# Patient Record
Sex: Female | Born: 1950
Health system: Southern US, Community
[De-identification: ages and names within clinical notes are randomized; demographics above are authoritative.]

## PROBLEM LIST (undated history)

## (undated) DIAGNOSIS — I4891 Unspecified atrial fibrillation: Secondary | ICD-10-CM

## (undated) DIAGNOSIS — I609 Nontraumatic subarachnoid hemorrhage, unspecified: Secondary | ICD-10-CM

## (undated) DIAGNOSIS — M1990 Unspecified osteoarthritis, unspecified site: Secondary | ICD-10-CM

## (undated) DIAGNOSIS — K219 Gastro-esophageal reflux disease without esophagitis: Secondary | ICD-10-CM

## (undated) DIAGNOSIS — Z87442 Personal history of urinary calculi: Secondary | ICD-10-CM

## (undated) DIAGNOSIS — H5440 Blindness, one eye, unspecified eye: Secondary | ICD-10-CM

## (undated) DIAGNOSIS — E785 Hyperlipidemia, unspecified: Secondary | ICD-10-CM

## (undated) DIAGNOSIS — J189 Pneumonia, unspecified organism: Secondary | ICD-10-CM

## (undated) DIAGNOSIS — I10 Essential (primary) hypertension: Secondary | ICD-10-CM

## (undated) DIAGNOSIS — Z9889 Other specified postprocedural states: Secondary | ICD-10-CM

## (undated) DIAGNOSIS — H539 Unspecified visual disturbance: Secondary | ICD-10-CM

## (undated) DIAGNOSIS — I509 Heart failure, unspecified: Secondary | ICD-10-CM

## (undated) DIAGNOSIS — F419 Anxiety disorder, unspecified: Secondary | ICD-10-CM

## (undated) DIAGNOSIS — H269 Unspecified cataract: Secondary | ICD-10-CM

## (undated) DIAGNOSIS — K589 Irritable bowel syndrome without diarrhea: Secondary | ICD-10-CM

## (undated) HISTORY — DX: Unspecified osteoarthritis, unspecified site: M19.90

## (undated) HISTORY — DX: Essential (primary) hypertension: I10

## (undated) HISTORY — DX: Nontraumatic subarachnoid hemorrhage, unspecified: I60.9

## (undated) HISTORY — DX: Unspecified visual disturbance: H53.9

## (undated) HISTORY — DX: Hyperlipidemia, unspecified: E78.5

## (undated) HISTORY — DX: Unspecified cataract: H26.9

## (undated) HISTORY — DX: Heart failure, unspecified: I50.9

## (undated) HISTORY — DX: Gastro-esophageal reflux disease without esophagitis: K21.9

## (undated) HISTORY — DX: Blindness, one eye, unspecified eye: H54.40

## (undated) HISTORY — DX: Irritable bowel syndrome, unspecified: K58.9

---
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
## 1984-11-16 HISTORY — PX: ABDOMINAL HYSTERECTOMY: SHX81

## 1992-11-16 HISTORY — PX: REFRACTIVE SURGERY: SHX103

## 1993-11-16 DIAGNOSIS — H5440 Blindness, one eye, unspecified eye: Secondary | ICD-10-CM

## 1993-11-16 HISTORY — DX: Blindness, one eye, unspecified eye: H54.40

## 1998-11-16 HISTORY — PX: SPINE SURGERY: SHX786

## 1999-01-16 ENCOUNTER — Ambulatory Visit (HOSPITAL_BASED_OUTPATIENT_CLINIC_OR_DEPARTMENT_OTHER): Admission: RE | Admit: 1999-01-16 | Discharge: 1999-01-16 | Payer: Self-pay | Admitting: Orthopedic Surgery

## 1999-08-26 ENCOUNTER — Encounter: Admission: RE | Admit: 1999-08-26 | Discharge: 1999-08-26 | Payer: Self-pay | Admitting: Obstetrics and Gynecology

## 1999-09-02 ENCOUNTER — Encounter: Payer: Self-pay | Admitting: Neurosurgery

## 1999-09-03 ENCOUNTER — Encounter: Payer: Self-pay | Admitting: Neurosurgery

## 1999-09-03 ENCOUNTER — Inpatient Hospital Stay (HOSPITAL_COMMUNITY): Admission: RE | Admit: 1999-09-03 | Discharge: 1999-09-06 | Payer: Self-pay | Admitting: Neurosurgery

## 1999-09-27 ENCOUNTER — Encounter: Payer: Self-pay | Admitting: Endocrinology

## 1999-09-27 ENCOUNTER — Inpatient Hospital Stay (HOSPITAL_COMMUNITY): Admission: EM | Admit: 1999-09-27 | Discharge: 1999-09-30 | Payer: Self-pay | Admitting: Emergency Medicine

## 1999-09-28 ENCOUNTER — Encounter (HOSPITAL_BASED_OUTPATIENT_CLINIC_OR_DEPARTMENT_OTHER): Payer: Self-pay | Admitting: Internal Medicine

## 1999-09-29 ENCOUNTER — Encounter: Payer: Self-pay | Admitting: General Surgery

## 1999-11-05 ENCOUNTER — Encounter: Admission: RE | Admit: 1999-11-05 | Discharge: 1999-11-05 | Payer: Self-pay | Admitting: Neurosurgery

## 1999-11-05 ENCOUNTER — Encounter: Payer: Self-pay | Admitting: Neurosurgery

## 2000-11-30 ENCOUNTER — Encounter: Payer: Self-pay | Admitting: Obstetrics and Gynecology

## 2000-11-30 ENCOUNTER — Encounter: Admission: RE | Admit: 2000-11-30 | Discharge: 2000-11-30 | Payer: Self-pay | Admitting: Obstetrics and Gynecology

## 2001-09-14 ENCOUNTER — Ambulatory Visit (HOSPITAL_BASED_OUTPATIENT_CLINIC_OR_DEPARTMENT_OTHER): Admission: RE | Admit: 2001-09-14 | Discharge: 2001-09-14 | Payer: Self-pay | Admitting: Orthopedic Surgery

## 2002-08-08 ENCOUNTER — Encounter (HOSPITAL_BASED_OUTPATIENT_CLINIC_OR_DEPARTMENT_OTHER): Payer: Self-pay | Admitting: Internal Medicine

## 2002-08-08 ENCOUNTER — Encounter: Admission: RE | Admit: 2002-08-08 | Discharge: 2002-08-08 | Payer: Self-pay | Admitting: Internal Medicine

## 2002-08-22 ENCOUNTER — Ambulatory Visit (HOSPITAL_COMMUNITY): Admission: RE | Admit: 2002-08-22 | Discharge: 2002-08-22 | Payer: Self-pay | Admitting: Internal Medicine

## 2002-08-22 ENCOUNTER — Encounter (HOSPITAL_BASED_OUTPATIENT_CLINIC_OR_DEPARTMENT_OTHER): Payer: Self-pay | Admitting: Internal Medicine

## 2002-08-24 ENCOUNTER — Ambulatory Visit (HOSPITAL_BASED_OUTPATIENT_CLINIC_OR_DEPARTMENT_OTHER): Admission: RE | Admit: 2002-08-24 | Discharge: 2002-08-24 | Payer: Self-pay | Admitting: Orthopedic Surgery

## 2002-10-19 ENCOUNTER — Encounter: Payer: Self-pay | Admitting: Gastroenterology

## 2002-10-19 ENCOUNTER — Ambulatory Visit (HOSPITAL_COMMUNITY): Admission: RE | Admit: 2002-10-19 | Discharge: 2002-10-19 | Payer: Self-pay | Admitting: Gastroenterology

## 2002-11-16 HISTORY — PX: CARPAL TUNNEL RELEASE: SHX101

## 2003-01-25 ENCOUNTER — Encounter: Payer: Self-pay | Admitting: Obstetrics and Gynecology

## 2003-01-25 ENCOUNTER — Ambulatory Visit (HOSPITAL_COMMUNITY): Admission: RE | Admit: 2003-01-25 | Discharge: 2003-01-25 | Payer: Self-pay | Admitting: Obstetrics and Gynecology

## 2003-03-06 ENCOUNTER — Encounter: Admission: RE | Admit: 2003-03-06 | Discharge: 2003-03-06 | Payer: Self-pay | Admitting: Internal Medicine

## 2003-03-06 ENCOUNTER — Encounter (HOSPITAL_BASED_OUTPATIENT_CLINIC_OR_DEPARTMENT_OTHER): Payer: Self-pay | Admitting: Internal Medicine

## 2004-05-06 ENCOUNTER — Encounter: Admission: RE | Admit: 2004-05-06 | Discharge: 2004-05-06 | Payer: Self-pay | Admitting: Internal Medicine

## 2004-07-24 ENCOUNTER — Ambulatory Visit (HOSPITAL_COMMUNITY): Admission: RE | Admit: 2004-07-24 | Discharge: 2004-07-24 | Payer: Self-pay | Admitting: Obstetrics and Gynecology

## 2005-01-27 ENCOUNTER — Encounter: Admission: RE | Admit: 2005-01-27 | Discharge: 2005-01-27 | Payer: Self-pay | Admitting: Family Medicine

## 2005-06-16 HISTORY — PX: TOTAL KNEE ARTHROPLASTY: SHX125

## 2005-06-17 ENCOUNTER — Inpatient Hospital Stay (HOSPITAL_COMMUNITY): Admission: RE | Admit: 2005-06-17 | Discharge: 2005-06-21 | Payer: Self-pay | Admitting: Orthopedic Surgery

## 2005-07-23 ENCOUNTER — Encounter: Admission: RE | Admit: 2005-07-23 | Discharge: 2005-09-02 | Payer: Self-pay | Admitting: Orthopedic Surgery

## 2005-10-16 HISTORY — PX: SHOULDER SURGERY: SHX246

## 2005-10-23 ENCOUNTER — Ambulatory Visit (HOSPITAL_COMMUNITY): Admission: RE | Admit: 2005-10-23 | Discharge: 2005-10-24 | Payer: Self-pay | Admitting: Orthopaedic Surgery

## 2005-11-16 DIAGNOSIS — I609 Nontraumatic subarachnoid hemorrhage, unspecified: Secondary | ICD-10-CM

## 2005-11-16 HISTORY — DX: Nontraumatic subarachnoid hemorrhage, unspecified: I60.9

## 2006-01-17 ENCOUNTER — Inpatient Hospital Stay (HOSPITAL_COMMUNITY): Admission: EM | Admit: 2006-01-17 | Discharge: 2006-01-26 | Payer: Self-pay | Admitting: Emergency Medicine

## 2006-02-02 ENCOUNTER — Encounter: Admission: RE | Admit: 2006-02-02 | Discharge: 2006-02-02 | Payer: Self-pay | Admitting: Neurological Surgery

## 2006-03-11 ENCOUNTER — Ambulatory Visit (HOSPITAL_COMMUNITY): Admission: RE | Admit: 2006-03-11 | Discharge: 2006-03-11 | Payer: Self-pay | Admitting: Obstetrics and Gynecology

## 2006-03-26 ENCOUNTER — Encounter (INDEPENDENT_AMBULATORY_CARE_PROVIDER_SITE_OTHER): Payer: Self-pay | Admitting: Specialist

## 2006-03-26 ENCOUNTER — Encounter: Admission: RE | Admit: 2006-03-26 | Discharge: 2006-03-26 | Payer: Self-pay | Admitting: Obstetrics and Gynecology

## 2006-04-16 HISTORY — PX: BREAST LUMPECTOMY: SHX2

## 2006-04-22 ENCOUNTER — Ambulatory Visit (HOSPITAL_BASED_OUTPATIENT_CLINIC_OR_DEPARTMENT_OTHER): Admission: RE | Admit: 2006-04-22 | Discharge: 2006-04-22 | Payer: Self-pay | Admitting: Surgery

## 2006-04-22 ENCOUNTER — Encounter: Admission: RE | Admit: 2006-04-22 | Discharge: 2006-04-22 | Payer: Self-pay | Admitting: Surgery

## 2006-04-22 ENCOUNTER — Encounter (INDEPENDENT_AMBULATORY_CARE_PROVIDER_SITE_OTHER): Payer: Self-pay | Admitting: *Deleted

## 2006-04-24 ENCOUNTER — Encounter: Admission: RE | Admit: 2006-04-24 | Discharge: 2006-04-24 | Payer: Self-pay | Admitting: Orthopaedic Surgery

## 2006-05-16 HISTORY — PX: SHOULDER SURGERY: SHX246

## 2006-06-04 ENCOUNTER — Ambulatory Visit (HOSPITAL_COMMUNITY): Admission: RE | Admit: 2006-06-04 | Discharge: 2006-06-05 | Payer: Self-pay | Admitting: Orthopaedic Surgery

## 2006-06-23 ENCOUNTER — Encounter: Admission: RE | Admit: 2006-06-23 | Discharge: 2006-08-20 | Payer: Self-pay | Admitting: Orthopaedic Surgery

## 2006-08-25 ENCOUNTER — Encounter: Admission: RE | Admit: 2006-08-25 | Discharge: 2006-09-23 | Payer: Self-pay | Admitting: Internal Medicine

## 2006-09-24 ENCOUNTER — Encounter: Admission: RE | Admit: 2006-09-24 | Discharge: 2006-11-01 | Payer: Self-pay | Admitting: Internal Medicine

## 2006-11-02 ENCOUNTER — Encounter: Admission: RE | Admit: 2006-11-02 | Discharge: 2006-11-15 | Payer: Self-pay | Admitting: Internal Medicine

## 2006-11-22 ENCOUNTER — Encounter: Admission: RE | Admit: 2006-11-22 | Discharge: 2006-12-09 | Payer: Self-pay | Admitting: Internal Medicine

## 2006-12-05 ENCOUNTER — Encounter: Admission: RE | Admit: 2006-12-05 | Discharge: 2006-12-05 | Payer: Self-pay

## 2007-03-22 ENCOUNTER — Emergency Department (HOSPITAL_COMMUNITY): Admission: EM | Admit: 2007-03-22 | Discharge: 2007-03-22 | Payer: Self-pay | Admitting: Emergency Medicine

## 2007-03-23 ENCOUNTER — Encounter: Admission: RE | Admit: 2007-03-23 | Discharge: 2007-03-23 | Payer: Self-pay | Admitting: Orthopedic Surgery

## 2007-08-17 ENCOUNTER — Encounter: Admission: RE | Admit: 2007-08-17 | Discharge: 2007-08-17 | Payer: Self-pay | Admitting: Chiropractic Medicine

## 2007-12-13 ENCOUNTER — Encounter: Admission: RE | Admit: 2007-12-13 | Discharge: 2007-12-13 | Payer: Self-pay | Admitting: Obstetrics and Gynecology

## 2008-07-09 ENCOUNTER — Ambulatory Visit (HOSPITAL_COMMUNITY): Admission: RE | Admit: 2008-07-09 | Discharge: 2008-07-09 | Payer: Self-pay | Admitting: Family Medicine

## 2008-07-13 ENCOUNTER — Encounter: Admission: RE | Admit: 2008-07-13 | Discharge: 2008-07-13 | Payer: Self-pay | Admitting: Family Medicine

## 2009-07-02 ENCOUNTER — Encounter: Admission: RE | Admit: 2009-07-02 | Discharge: 2009-07-29 | Payer: Self-pay | Admitting: Family Medicine

## 2010-02-12 ENCOUNTER — Encounter: Admission: RE | Admit: 2010-02-12 | Discharge: 2010-02-12 | Payer: Self-pay | Admitting: Internal Medicine

## 2010-12-06 ENCOUNTER — Encounter: Payer: Self-pay | Admitting: Orthopedic Surgery

## 2010-12-06 ENCOUNTER — Encounter: Payer: Self-pay | Admitting: Family Medicine

## 2011-04-03 NOTE — Discharge Summary (Signed)
[name], [name]                 ACCOUNT NO.:  [id number]   MEDICAL RECORD NO.:  [id number]          PATIENT TYPE:  INP   LOCATION:  5018                         FACILITY:  [facility]   PHYSICIAN:  Nadara Mustard, MD     DATE OF BIRTH:  Mar 31, 1951   DATE OF ADMISSION:  06/17/2005  DATE OF DISCHARGE:  06/21/2005                                 DISCHARGE SUMMARY   DIAGNOSIS:  Osteoarthritis right knee.   PROCEDURE:  Right total knee arthroplasty.   Discharged to home in stable condition with Coumadin for DVT prophylaxis and  Vicodin for pain. Plan to follow-up in 2 weeks.   HISTORY OF PRESENT ILLNESS:  The patient is a 60 year old woman with  osteoarthritis of her right knee. The patient has failed conservative care  and presents at this time for right total knee arthroplasty. The patient  underwent a right total knee arthroplasty on June 17, 2005 with DePuy  components. The implants were sized 2.5 tibia, 3.0 femur, 32 mm patella, and  12.5 mm poly tray. The patient received Kefzol for infection prophylaxis and  Coumadin for DVT prophylaxis. The patient progressed well with physical  therapy. She ambulated approximately 10 feet on postoperative day two. Her  hemoglobin was stable at 9.9. The patient continued to progress well with  physical therapy and was discharged to home in stable condition on June 21, 2005 with home health physical therapy. Prescriptions for Vicodin and  Coumadin with follow-up in the office in 2 weeks.      Nadara Mustard, MD  Electronically Signed     MVD/MEDQ  D:  08/06/2005  T:  08/06/2005  Job:  [id number]
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
## 2011-04-03 NOTE — Op Note (Signed)
[name], [name]                 ACCOUNT NO.:  [id number]   MEDICAL RECORD NO.:  [id number]          PATIENT TYPE:  AMB   LOCATION:  DSC                          FACILITY:  [facility]   PHYSICIAN:  Currie Paris, M.D.DATE OF BIRTH:  [birth year]   DATE OF PROCEDURE:  04/22/2006  DATE OF DISCHARGE:                                 OPERATIVE REPORT   PREOPERATIVE DIAGNOSIS:  Left breast mass mammographically found.   POSTOPERATIVE DIAGNOSIS:  Left breast mass mammographically found.   OPERATION:  Needle localized excision left breast mass.   SURGEON:  Currie Paris, M.D.   ANESTHESIA:  MAC.   CLINICAL HISTORY:  [name] is a 60 year old who had an abnormality found on  mammogram.  She had a biopsy done but the pathology was discoordinate with  the lesion and excisional biopsy was recommended.  A clip had been placed at  the time of the biopsy but apparently had migrated away from the area in  question.   DESCRIPTION OF PROCEDURE:  The patient was seen in the holding area and she  had no further questions.  She already had her guidewire placed in the left  breast and she and I both confirmed and marked the left breast as the  operative side.  She had no further questions.  The patient was taken to  operating room and after being given IV sedation, the left breast was  prepped and draped as a sterile field.  The time-out occurred. An X had  marked the spot on the breast where the lesion had been noted.  The  guidewire entered in the medial part of the breast and tracked towards the  areola.  The X mark was about 1 cm away from the areolar edge.   I infiltrated the area with 1% Xylocaine plain mixed with 0.5% Marcaine with  epi.  I made a curvilinear circumareolar incision at the areolar margin.  I  elevated the skin flap medially until I could identify the guidewire  manipulated into the wound.  Then using cautery, I excised the area around  the guidewire going down towards  the chest wall, first medially and then  superior inferiorly, and then deep.  I extended the excision to go under the  nipple areolar complex because that is where the clip appeared to have  migrated and in an attempt to see if the clip could be retrieved but this  was not fully localized on the films.  This entire area felt like some fatty  tissue with some breast tissue mixed in, there was not a true obvious cancer  palpable in the tissue but I was well around the guidewire.  Additional  local was infiltrated as I worked.  Everything appeared to be dry.  I  infiltrated additional local for help with postop analgesia.  The incision  was closed with some 3-0 Vicryl, 4-0 Monocryl subcuticular, and some  Dermabond on the skin.  Radiology reported that the specimen mammogram  indicated that the area in question was in the center of the specimen.  The  patient  tolerated the procedure well.  There were no operative complications  and all counts were correct.      Currie Paris, M.D.  Electronically Signed     CJS/MEDQ  D:  04/22/2006  T:  04/22/2006  Job:  [id number]   cc:   Barry Dienes. Eloise Harman, M.D.  Fax: [phone]   S. Kyra Manges, M.D.  Fax: [phone]

## 2011-04-03 NOTE — Op Note (Signed)
[place]. [facility]  Patient:    [name], [name] Visit Number: [id number] MRN: [id number]          Service Type: Attending:  Nicki Reaper, M.D. Dictated by:   Nicki Reaper, M.D. Proc. Date: 09/14/01                             Operative Report  PREOPERATIVE DIAGNOSIS:  Stenosing tenosynovitis left ring finger.  POSTOPERATIVE DIAGNOSIS:  Stenosing tenosynovitis left ring finger.  OPERATION:  Release A-1 pulley, left ring finger.  SURGEON:  Nicki Reaper, M.D.  ASSISTANT:  R.N.  ANESTHESIA:  Forearm-based IV regional.  ANESTHESIOLOGIST:  Halford Decamp, M.D.  HISTORY:  The patient is a 60 year old female with a history of triggering of her left ring finger, nonresponsive to conservative treatment.  PROCEDURE:  The patient was brought to the operating room where a forearm-based IV regional anesthetic was carried out without difficulty.  She was prepped and draped using Betadine scrub and solution with the left arm free.  A longitudinal incision was made, carried down through subcutaneous tissue, and bleeders were electrocautery.  All neurovascular structures were protected until the A-1 pulley was identified.  This was found to be marked thickened, and incision was made on the radial aspect.  A small incision was made in the A-2 pulley centrally.  A small cyst was present at the junction of A-1 and A-2.  This was broken.  The wound was irrigated.  The skin was closed with interrupted 5-0 nylon sutures.  A sterile compressive dressing was applied.  The patient tolerated the procedure well and was taken to the recovery room, for observation, in satisfactory condition.  She is discharged home, to return to the [facility] [place] in one week, on Vicodin and Keflex. Dictated by:   Nicki Reaper, M.D. Attending:  Nicki Reaper, M.D. DD:  09/14/01 TD:  09/14/01 Job: [id number]

## 2011-04-03 NOTE — Consult Note (Signed)
[place]. [facility]  Patient:    [name]                         MRN: [id number] Proc. Date: 09/27/99 Adm. Date:  [id number] Attending:  Garlan Fillers CC:         Barry Dienes. Eloise Harman, M.D.                          Consultation Report  REASON FOR CONSULTATION:  Abdominal pain.  HISTORY OF PRESENT ILLNESS:  This 60 year old female was in her normal state of  health.  She ate some lasagne for lunch and then developed acute onset of mid right-sided abdominal pain that radiated down to her lower abdominal area and across to the left side.  The pain has persisted and worsened.  She has had nausea and vomiting with the pain.  She has had an appendectomy in the past.  Two weeks ago, she had removal of a schwannoma from her spinal cord.  She states she had constipation after that and was given some MiraLax, and that cleared the constipation, but now she is having loose bowel movements, approximately one to two a day.  She denies any blood in the stool.  She denies fever or chills.  PAST MEDICAL HISTORY: 1. Type 1 diabetes mellitus. 2. Hyperlipidemia. 3. Schwannoma of the spinal cord as above. 4. Hypertension.  PREVIOUS OPERATIONS:  Total abdominal hysterectomy and appendectomy; open reduction and internal fixation of right tibia fracture; hardware extraction, muscle flat to right lower extremity; open reduction and internal fixation, right upper extremity fracture.  ALLERGIES:  CODEINE.  MEDICATIONS:  Altace, insulin, Neurontin, Lipitor.  SOCIAL HISTORY:  No tobacco use.  Occasional alcohol use.  REVIEW OF SYSTEMS:  Cardiovascular: No known heart disease, heart attack, rheumatic fever, or murmur.  GI: No peptic ulcer disease, diverticulitis, or hepatitis or  intermittent rectal bleeding.  GU: No known kidney stones.  She did have a urinary tract infection and was treated with sulfa drugs after her back surgery.  PHYSICAL  EXAMINATION:  GENERAL:  She is an uncomfortable female.  VITAL SIGNS:  Temperature 99 degrees.  NECK:  Supple without any palpable masses.  LUNGS:  Equal breath sounds, equal expansion.  Clear to auscultation.  ABDOMEN: Soft.  There is right upper quadrant tenderness but more suprapubic and left lower quadrant tenderness with some voluntary guarding in the suprapubic and left lower quadrant areas.  There is also some right CVA tenderness. Hypoactive bowel sounds are present.  There is a lower transverse scar.  RECTAL:  Normal tone, no masses.  Stool guaiac negative.  LABORATORY DATA:  White cell count 13,300, hemoglobin 13.7.  Liver function tests and amylase were normal.  Glucose 308.  Acute abdominal series demonstrates no free air.  No dilated small-bowel loops.  There is gas in the proximal colon and transverse colon.  There is a foreign body in the right mid abdomen suspicious for renal calculus.  IMPRESSION:  Abdominal pain with differential diagnoses to include acute cholecystitis, diverticulitis, cystitis, or pyelonephritis.  RECOMMENDATIONS:  Will check urinalysis and then start her on empiric IV antibiotics.  If UA is negative, I would perform a CT scan of her abdomen. DD:  09/27/99 TD:  09/28/99 Job: [id number]

## 2011-04-03 NOTE — Consult Note (Signed)
[name], [name]   MEDICAL RECORD NO.:  [id number]          PATIENT TYPE:  INP   LOCATION:  3020                         FACILITY:  [facility]   PHYSICIAN:  Tia Alert, MD     DATE OF BIRTH:  1951/10/05   DATE OF CONSULTATION:  DATE OF DISCHARGE:  01/26/2006                                   CONSULTATION   ADMITTING DIAGNOSIS:  Subarachnoid hemorrhage.   PROCEDURE:  Four-vessel cerebral arteriogram.   BRIEF HISTORY OF PRESENT ILLNESS:  [name] is a 60 year old white female,  with a history of diabetes mellitus and hypertension, who presented to the  emergency department with complaints of sudden onset worse headache of her  life with associated neck pain and nausea. She had a CT scan done which  revealed thick subarachnoid hemorrhage in the basal cisterns and  neurosurgical evaluation was requested. She was found to be somewhat  lethargic but easily arousable and appropriate in speech with a nonfocal  motor and sensory exam. She was admitted to the neurosurgical ICU and four-  vessel cerebral arteriogram was ordered to rule out aneurysm.   HOSPITAL COURSE:  The patient was taken for a four-vessel cerebral  arteriogram on the day of her subarachnoid hemorrhage that showed no  evidence of intracranial aneurysm or other vascular lesion to explain her  subarachnoid hemorrhage. She was admitted to the neurosurgical ICU where she  had significant headache with nausea and vomiting. She was treated with  Zofran and Phenergan for nausea and treated with morphine for headaches. She  was, on hospital day #2, much more awake and alert with a nonfocal  neurologic exam. She was taken for MRI/MRA of the brain that day which  showed again no evidence of intracranial aneurysm. She remained in the ICU.  She had a repeat CT scan the following day which showed some dilatation of  her third ventricle and temple horn suggestive of some early hydrocephalus.  She had no evidence of hyponatremia. She was placed on Nimotop to help  prevent symptomatic vasospasm and was also placed on Dilantin. She had been  on Decadron but that was making her sugars difficult to control and  therefore she was taken off of this. She was placed on sliding scale as well  as her home insulin. Her blood pressure was controlled with oral blood  pressure medications. The following day, she had another CT scan which  showed what I thought was improvement of her ventricular size and  symptomatically was continuing to improve with intermittent headaches but  not constant headaches and the nausea and vomiting was certainly improving.  She got to the point that she was taking a diet without emesis. She was  transferred to the floor for further care. Then on January 25, 2006 she was  scheduled for a follow-up arteriogram which again showed no evidence  intracranial aneurysm. By this time, she had some headache controlled with  her oral pain medications. She was awake and alert and again had a nonfocal  neurologic exam. We felt she was safe for  discharge home. She was discharged  home January 26, 2006 with plans to follow up with me in one week.   DISCHARGE MEDICATIONS:  Percocet 5/325 1-2 p.o. q.4-6h. p.r.n. pain, 80  pills and no refills. We will take her off of Nimotop for discharge home.  She is to continue her home medications. The patient was given discharge  instructions. She demonstrated understanding of all discharge instructions  and had all of her questions answered to the best of my ability. A return  appointment is in one week.   Final Diagnosis: subarachnoid hemorrhage      Tia Alert, MD  Electronically Signed     DSJ/MEDQ  D:  01/26/2006  T:  01/26/2006  Job:  [id number]
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
## 2011-04-03 NOTE — Op Note (Signed)
NAME:  [name], [name]                           ACCOUNT NO.:  [id number]   MEDICAL RECORD NO.:  [id number]                   PATIENT TYPE:  AMB   LOCATION:  DSC                                  FACILITY:  [facility]   PHYSICIAN:  Cindee Salt, MD                      DATE OF BIRTH:  [birth year]   DATE OF PROCEDURE:  08/24/2002  DATE OF DISCHARGE:  08/24/2002                                 OPERATIVE REPORT   PREOPERATIVE DIAGNOSIS:  Carpal tunnel syndrome right hand/stenosing  tenosynovitis right thumb.   POSTOPERATIVE DIAGNOSIS:  Carpal tunnel syndrome right hand/stenosing  tenosynovitis right thumb.   OPERATION:  Release A1 pulley right thumb/release right carpal canal.   SURGEON:  Cindee Salt, M.D.   ASSISTANT:  R.N.   ANESTHESIA:  Forearm-based IV regional.   DATE OF OPERATION:  August 24, 2002   HISTORY:  The patient is a 60 year old female with a history of bilateral  carpal tunnel syndrome and triggering of her right thumb not responsive to  conservative treatment.   DESCRIPTION OF PROCEDURE:  The patient was brought to the operating room  where a forearm-based IV regional anesthetic was carried out without  difficulty.  She was prepped and draped using Betadine scrubbing solution  with the right arm free.  A transverse incision was made over the A1 pulley  of the thumb carried down through subcutaneous tissue.  The bleeders were  electrocauterized.  The nerves were identified and protected to the radial  side of the first A1 pulley.  An incision was made releasing the thumb which  was able to be passed through a full range of motion without difficulty.  The wound was irrigated and closed with interrupted 5-0 nylon sutures.  A  separate incision was then made over the carpal canal longitudinally carried  down through subcutaneous tissue.  Bleeders were again electrocauterized.  Palmar fascia was split.  Superficial palmar arteries were identified.  The  flexor tendon to the  ring and little finger identified to the ulnar side and  median nerve.  Carpal retinaculum was incised with sharp dissection.  A  right angle and Sewell retractor were placed between skin and forearm  fascia.  The fascia was released for approximately 3 cm proximal to the  wrist crease under direct vision.  The canal was explored.  No further  lesions were identified.  The tenosynovial tissue was moderately thickened.  There was significant fibrosis through the entire canal area.  The wound was  irrigated.  The skin was closed with interrupted 5-0 nylon sutures.  A  sterile compressive dressing and splint was applied.  The patient tolerated  the procedure well and was taken to the recovery room for observation in  satisfactory condition.  She is discharged to home to return to the [facility] [place] in 1 week on Talwin NX  and Keflex.                                               Cindee Salt, MD    GK/MEDQ  D:  08/24/2002  T:  08/27/2002  Job:  [id number]
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
## 2011-04-03 NOTE — Discharge Summary (Signed)
Clements, Brandi NO.:  [id number]   MEDICAL RECORD NO.:  [id number]          PATIENT TYPE:  INP   LOCATION:  3020                         FACILITY:  [facility]   PHYSICIAN:  Tia Alert, MD     DATE OF BIRTH:  [birth year]   DATE OF ADMISSION:  01/17/2006  DATE OF DISCHARGE:  01/26/2006                                 DISCHARGE SUMMARY   ADMITTING DIAGNOSIS:  Subarachnoid hemorrhage.   PROCEDURES:  Four-vessel cerebral arteriogram.   HISTORY OF PRESENT ILLNESS:  [name] is a 60 year old white female with a  history of diabetes mellitus and hypertension who presented to the emergency  department on January 17, 2006, with complaints of sudden onset worse headache  of her life with associated neck pain and nausea. She fell like the onset  was about 3-4 hours prior to admission to the emergency department. CT scan  showed thick subarachnoid hemorrhage in the basal cisterns and neurosurgical  evaluation was requested. The patient was somewhat lethargic but easily  arousable and answered questions appropriately.  She had a nonfocal motor  and sensory exam. She was admitted to the neurosurgical ICU for further  care.   HOSPITAL COURSE:  The patient was admitted to the neurosurgical ICU.  I  called Dr. Kerby Nora to do a four-vessel cerebral arteriogram to look  for a cause of what was suspected to be a potential intracerebral aneurysm.  She was started on nimodipine, Decadron, and Dilantin.  Dr. Corliss Skains  performed the four-vessel cerebral  arteriogram that evening and this showed  no evidence of cerebral aneurysm or other vascular malformation. The  following day, the patient looked somewhat better. She continued to complain  of headache with significant nausea but no numbness, tingling, weakness. She  was following commands. She was awake and alert. We ordered an MRI of the  brain and cervical spine that showed no vascular malformation or significant  aneurysm.   On hospital day three, she continued to have headache with nausea  and vomiting. She was placed on Zofran and Phenergan for this. She continued  on [facility] therapy. She was again given Phenergan and Zofran for nausea and  vomiting. Her neurologic exam remained nonfocal. Head CT the next day showed  no evidence of increasing ventricular size to suggest hydrocephalus. She  continued be awake and alert, oriented, she only complained of headache  which responded to medications. She continued along this course and was  transferred to the [facility] where her diet was advanced on January 21, 2006, to  liquids which she tolerated fairly well. CT scan again showed decreasing  size of the ventricular system at this time with resolving subarachnoid  hemorrhage. She had a repeat arteriogram on January 25, 2006, which again  showed no evidence of vasospasm or of aneurysm, AVM, dissection, or other  cause of subarachnoid hemorrhage. She was then transferred to the floor  where she continued to do well. She had some headache but this was well-  controlled on oral pain medications. She was tolerating a regular diet. She  was ambulating  without difficulty and she was discharged home on January 26, 2006, in stable condition.   DISCHARGE MEDICATIONS:  Vicodin 1-2 p.o. q.6h. p.r.n. pain. She was to  continue her home medications.   FOLLOW-UP:  One week with Dr. Yetta Barre.   FINAL DIAGNOSIS:  Arteriogram negative subarachnoid hemorrhage.      Tia Alert, MD  Electronically Signed     DSJ/MEDQ  D:  03/05/2006  T:  03/06/2006  Job:  [id number]
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
## 2011-04-03 NOTE — Op Note (Signed)
NAMESELENY, [name]                 ACCOUNT NO.:  [id number]   MEDICAL RECORD NO.:  [id number]          PATIENT TYPE:  INP   LOCATION:  2550                         FACILITY:  [facility]   PHYSICIAN:  Nadara Mustard, MD     DATE OF BIRTH:  February 21, 1951   DATE OF PROCEDURE:  06/17/2005  DATE OF DISCHARGE:                                 OPERATIVE REPORT   PREOP DIAGNOSIS:  Osteoarthritis, right knee.   POSTOP DIAGNOSIS:  Osteoarthritis, right knee.   PROCEDURE:  Right total knee arthroplasty.   SURGEON.:  Nadara Mustard, MD   ANESTHESIA:  General.   ESTIMATED BLOOD LOSS:  Minimal.   ANTIBIOTICS:  1 gram Kefzol.   TOURNIQUET TIME:  59 minutes at 300 mmHg at the thigh.   DISPOSITION:  To PACU in stable condition.   IMPLANTS:  Tibial component 2.5, femoral component 3.0, patella 32 mm, tray  of 12.5 mm.   INDICATIONS FOR PROCEDURE:  The patient is a 60 year old woman with  osteoarthritis of her right knee. She has had a traumatic arthritis, is  status post surgical intervention with free flap to the right knee. She has  a previous surgical incision laterally over the patella, which extends down  to the free flap. The patient has undergone conservative care which has  included arthroscopy and anti-inflammatories. She has failed conservative  care and has pain with activities of daily living and wished to proceed with  total knee arthroplasty at this time. Risks and benefits were discussed  including infection, neurovascular injury, persistent pain, need for  additional surgery, DVT, pulmonary embolus. The patient states she  understands and wished proceed at this time.   DESCRIPTION OF PROCEDURE:  The patient was brought to OR room 4 and  underwent general anesthetic. After adequate level of anesthesia obtained,  the patient's right lower extremity was prepped using DuraPrep and draped in  a sterile field with Ioban used to cover all exposed skin. The knee was  flexed and the  tourniquet inflated to 3 mmHg. A incision was made over  previous incision which was a lateral parapatellar incision. A lateral  parapatellar retinacular incision was then made and the patella was everted  medially. The starting hole was then made over the femur with a drill.  Hole  started over the femoral canal. The internal guide was placed and 12 mm was  taken off the distal femur. The femur measured for 3.5.  This was pinned for  a 4 and a 3 cutting block was used. There was no notching of the femur. The  cutting block was then placed and chamfer cuts were made on the femur.  Attention was then focused on the tibia. External alignment guide was used  and 10 mm was taken off the lateral plateau. The chamfer block was then used  for the posterior stabilized component and this was in the posterior  stabilized box cut was then made for the femur. The tibial tray was then  sized and this sized for a 2.5 and this was pinned and the keel cut was  made  for 2.5. The trial components were placed and this was trialed with a 10 and  12.5 mm.  12.5 mm had good secure fit with stable varus and valgus and had a  slight amount of hyperextension. The trial components removed. The knee was  irrigated with pulse lavage. All meniscal tissue was removed. The tibial and  femoral component were cemented in place and the tibial tray was then placed  after further pulsatile lavage. The knee was held in extension while the  components had hardened. Previously the patella had been resurfaced for 8 mm  to accept the 32-mm patella. The 32 mm component was cemented in place after  the punch holes were made for the patella and this was left with the clamp  in place and the knee was left in extension until the cement hardened. The  knee was irrigated with pulsatile lavage during this time.  The tourniquet  was deflated after 59 minutes. Hemostasis was obtained. The knee was then  again placed through full range of  motion. There was no subluxation of the  patella. The retinacular incision was closed using #1 Vicryl. Subcu was  closed using 2-0 Vicryl.  The skin was closed using Proximate staples. The  wound was covered with Adaptic orthopedic sponges, sterile Webril and Coban  dressing. The patient was extubated, taken to PACU in stable condition.       MVD/MEDQ  D:  06/17/2005  T:  06/17/2005  Job:  [id number]

## 2011-11-19 ENCOUNTER — Encounter: Payer: Self-pay | Admitting: Neurology

## 2011-12-22 ENCOUNTER — Ambulatory Visit: Payer: Self-pay | Admitting: Neurology

## 2012-01-14 ENCOUNTER — Encounter: Payer: Self-pay | Admitting: Neurology

## 2012-01-14 ENCOUNTER — Ambulatory Visit (INDEPENDENT_AMBULATORY_CARE_PROVIDER_SITE_OTHER): Payer: MEDICARE | Admitting: Neurology

## 2012-01-14 VITALS — BP 162/78 | HR 72 | Wt 250.0 lb

## 2012-01-14 DIAGNOSIS — R42 Dizziness and giddiness: Secondary | ICD-10-CM
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
## 2012-01-14 NOTE — Patient Instructions (Signed)
Your MRI is scheduled for Monday, March 4th at 9:00am.  Please arrive to [facility], first floor admitting by 8:45am.  [phone].  Physical Therapy will call you directly to scheduled.

## 2012-01-15 ENCOUNTER — Ambulatory Visit: Payer: Medicare Other | Attending: Neurology | Admitting: Physical Therapy

## 2012-01-15 DIAGNOSIS — R42 Dizziness and giddiness: Secondary | ICD-10-CM | POA: Insufficient documentation

## 2012-01-15 DIAGNOSIS — IMO0001 Reserved for inherently not codable concepts without codable children: Secondary | ICD-10-CM | POA: Insufficient documentation

## 2012-01-15 DIAGNOSIS — R269 Unspecified abnormalities of gait and mobility: Secondary | ICD-10-CM | POA: Insufficient documentation

## 2012-01-18 ENCOUNTER — Ambulatory Visit (HOSPITAL_COMMUNITY)
Admission: RE | Admit: 2012-01-18 | Discharge: 2012-01-18 | Disposition: A | Discharge status: Home / Self Care | Payer: MEDICARE | Source: Ambulatory Visit | Attending: Neurology | Admitting: Neurology

## 2012-01-18 ENCOUNTER — Telehealth: Payer: Self-pay | Admitting: Neurology

## 2012-01-18 DIAGNOSIS — R42 Dizziness and giddiness: Secondary | ICD-10-CM

## 2012-01-18 NOTE — Telephone Encounter (Signed)
Pt could not complete MRI in regular machine this morning (01/18/2012). She would like to be rescheduled for an open MRI machine.

## 2012-01-19 NOTE — Telephone Encounter (Signed)
Rescheduled pt's MRI at [facility] in an open machine 01/21/12 at 3:00pm.  Pt aware.

## 2012-01-21 ENCOUNTER — Ambulatory Visit
Admission: RE | Admit: 2012-01-21 | Discharge: 2012-01-21 | Disposition: A | Discharge status: Home / Self Care | Payer: Medicare Other | Source: Ambulatory Visit | Attending: Neurology | Admitting: Neurology

## 2012-01-21 MED ORDER — GADOBENATE DIMEGLUMINE 529 MG/ML IV SOLN
20.0000 mL | Freq: Once | INTRAVENOUS | Status: AC | PRN
  Administered 2012-01-21: 20 mL via INTRAVENOUS

## 2012-01-22 ENCOUNTER — Other Ambulatory Visit: Payer: Self-pay | Admitting: Neurology

## 2012-01-22 ENCOUNTER — Ambulatory Visit
Admission: RE | Admit: 2012-01-22 | Discharge: 2012-01-22 | Disposition: A | Discharge status: Home / Self Care | Payer: Medicare Other | Source: Ambulatory Visit | Attending: Neurology | Admitting: Neurology

## 2012-01-22 ENCOUNTER — Ambulatory Visit: Payer: Medicare Other | Admitting: Physical Therapy

## 2012-01-22 DIAGNOSIS — R42 Dizziness and giddiness: Secondary | ICD-10-CM

## 2012-01-22 MED ORDER — GADOBENATE DIMEGLUMINE 529 MG/ML IV SOLN
15.0000 mL | Freq: Once | INTRAVENOUS | Status: AC | PRN
  Administered 2012-01-22: 15 mL via INTRAVENOUS

## 2012-01-25 ENCOUNTER — Ambulatory Visit: Payer: Medicare Other | Admitting: Physical Therapy

## 2012-01-26 ENCOUNTER — Telehealth: Payer: Self-pay | Admitting: Neurology

## 2012-01-26 NOTE — Telephone Encounter (Signed)
Pt would like MRI results from 01/22/2012.

## 2012-01-27 NOTE — Telephone Encounter (Signed)
Let [name] know that her MRI brain was ok.  No explanation for her dizziness - no tumor, stroke or finding related to her old subarachnoid hemorrhage.

## 2012-01-27 NOTE — Telephone Encounter (Signed)
Called and spoke with the patient. Information given as per Dr. Modesto Charon re: MRI results. No ather concerns voiced at this time.

## 2012-02-02 ENCOUNTER — Ambulatory Visit: Payer: Medicare Other | Admitting: Physical Therapy

## 2012-02-04 ENCOUNTER — Encounter: Payer: Medicare Other | Admitting: Physical Therapy

## 2012-02-05 ENCOUNTER — Ambulatory Visit: Payer: Medicare Other | Admitting: Physical Therapy

## 2012-02-08 ENCOUNTER — Ambulatory Visit: Payer: Medicare Other | Admitting: Physical Therapy

## 2012-02-08 ENCOUNTER — Encounter: Payer: Medicare Other | Admitting: Physical Therapy

## 2012-02-09 ENCOUNTER — Encounter: Payer: Medicare Other | Admitting: Physical Therapy

## 2012-02-10 ENCOUNTER — Encounter: Payer: Medicare Other | Admitting: Physical Therapy

## 2012-02-10 ENCOUNTER — Ambulatory Visit: Payer: Medicare Other | Admitting: Physical Therapy

## 2012-02-11 ENCOUNTER — Ambulatory Visit: Payer: Medicare Other | Admitting: Physical Therapy

## 2012-02-15 ENCOUNTER — Ambulatory Visit: Payer: Medicare Other | Attending: Neurology | Admitting: Physical Therapy

## 2012-02-15 DIAGNOSIS — IMO0001 Reserved for inherently not codable concepts without codable children: Secondary | ICD-10-CM | POA: Insufficient documentation

## 2012-02-15 DIAGNOSIS — R269 Unspecified abnormalities of gait and mobility: Secondary | ICD-10-CM | POA: Insufficient documentation

## 2012-02-15 DIAGNOSIS — R42 Dizziness and giddiness: Secondary | ICD-10-CM | POA: Insufficient documentation

## 2012-02-19 ENCOUNTER — Ambulatory Visit: Payer: Medicare Other | Admitting: Physical Therapy

## 2012-02-23 ENCOUNTER — Ambulatory Visit: Payer: Medicare Other | Admitting: Physical Therapy

## 2012-02-25 ENCOUNTER — Ambulatory Visit: Payer: Medicare Other | Admitting: Physical Therapy

## 2012-02-26 ENCOUNTER — Ambulatory Visit: Payer: Medicare Other | Admitting: Physical Therapy

## 2012-03-01 ENCOUNTER — Encounter: Payer: Medicare Other | Admitting: Physical Therapy

## 2012-03-01 ENCOUNTER — Ambulatory Visit: Payer: Medicare Other | Admitting: Physical Therapy

## 2012-03-02 ENCOUNTER — Encounter: Payer: Self-pay | Admitting: Neurology

## 2012-03-02 ENCOUNTER — Ambulatory Visit (INDEPENDENT_AMBULATORY_CARE_PROVIDER_SITE_OTHER): Payer: Medicare Other | Admitting: Neurology

## 2012-03-02 ENCOUNTER — Other Ambulatory Visit (INDEPENDENT_AMBULATORY_CARE_PROVIDER_SITE_OTHER): Payer: Medicare Other

## 2012-03-02 VITALS — BP 122/84 | HR 64 | Wt 245.0 lb

## 2012-03-02 DIAGNOSIS — R42 Dizziness and giddiness: Secondary | ICD-10-CM

## 2012-03-02 LAB — BASIC METABOLIC PANEL
BUN: 19 mg/dL (ref 6–23)
CO2: 32 mEq/L (ref 19–32)
Calcium: 9.8 mg/dL (ref 8.4–10.5)
Chloride: 103 mEq/L (ref 96–112)
Creatinine, Ser: 0.6 mg/dL (ref 0.4–1.2)
GFR: 113.84 mL/min (ref >60.00)
Glucose, Bld: 184 mg/dL — ABNORMAL HIGH (ref 70–99)
Potassium: 5.1 mEq/L (ref 3.5–5.1)
Sodium: 142 mEq/L (ref 135–145)

## 2012-03-02 NOTE — Progress Notes (Signed)
Dear Dr. Eloise Harman,  I saw  [name] back in [place] [facility] for her problem with dizziness.  As you may recall, she is a 61 y.o. 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 She describes it as a feeling of the walls moving. The sensation only lasts seconds. However she finds herself reaching for walls constantly for fear of the sensation coming on. She denies frank vertigo. She says that it is worse when she is looking at white walls. Generally if she stops walking the sensation gets better. She does not feel it has been progressive. She denies any problems with me change in hearing. She gets infrequent tinnitus is not correlated with these spells. She has had no significant change in medication before this began.   She's had no recent changes in her vision. She's never had a history of vertigo. She did have a burst eardrum from otitis media when she was a child.  When I first saw her I ordered an MRI of her brain.  There was no signs of an abnormality of the 8th nerve complex or ischemic stroke or superficial siderosis.  Feels more stable with therapy.    Still have spells of dizziness. Feeling off balance.  No change in position brings it on.  Never had on in bed.  Only when walking.  No swallowing or speaking problems at that time.  Had 4-5 in the last six weeks.  If she sits down she feels normal.  Blood sugar normal during most recent.  Seems to be getting slightly better with vestibular rehab, although has started using a walker.    Medical history, social history, and family history were reviewed and have not changed since the last clinic visit.  Current Outpatient Prescriptions on File Prior to Visit  Medication Sig Dispense Refill  . calcium-vitamin D 250-100 MG-UNIT per tablet Take 1  tablet by mouth 2 (two) times daily.      . fish oil-omega-3 fatty acids 1000 MG capsule Take 2 g by mouth daily.      Marland Kitchen glucosamine-chondroitin 500-400 MG tablet Take 1 tablet by mouth 3 (three) times daily.      . insulin glargine (LANTUS) 100 UNIT/ML injection Inject 10 Units into the skin at bedtime.      . insulin lispro (HUMALOG) 100 UNIT/ML injection Inject into the skin 3 (three) times daily before meals.      . latanoprost (XALATAN) 0.005 % ophthalmic solution 1 drop at bedtime.      Marland Kitchen lisinopril-hydrochlorothiazide (PRINZIDE,ZESTORETIC) 10-12.5 MG per tablet Take 1 tablet by mouth daily.      . metoprolol tartrate (LOPRESSOR) 25 MG tablet Take 25 mg by mouth 2 (two) times daily.      . Multiple Vitamin (MULTIVITAMIN) tablet Take 1 tablet by mouth daily.      . pravastatin (PRAVACHOL) 40 MG tablet Take 40 mg by mouth daily.      . raloxifene (EVISTA) 60 MG tablet Take 60 mg by mouth daily.      . vitamin C (ASCORBIC ACID) 500 MG tablet Take 500 mg by mouth daily.        Allergies  Allergen Reactions  . Codeine   . Tape     ROS:  13 systems were reviewed and are notable for numbness in right foot, after  her schwanomma surger.  All other review of systems are unremarkable.  Exam: Filed Vitals:   03/02/12 1128  BP: 122/84  Pulse: 64    In general, well nourished appearing women, very cheerful.  Mental status:   The patient is oriented to person, place and time. Recent and remote memory are intact. Attention span and concentration are normal. Language including repetition, naming, following commands are intact. Fund of knowledge of current and historical events, as well as vocabulary are normal.  Cranial Nerves: Right pupil unreactive to light.  NLP OD.    EOMS full bilaterally no nystagmus. Facial sensation and muscles of mastication are intact. Muscles of facial expression are symmetric. Hearing intact to bilateral finger rub. Tongue protrusion, uvula, palate midline.   Shoulder shrug intact  Motor: Full strength upper extremities except reduced ROM in right shoulder.  Lower extremities 4- HF bilaterally.  KE, KF full. FDF right 4, left 4+.  Reflexes:  2+ UE, absent right patella, 2+ left patella, absent ankles.  Coordination:  Normal finger to nose  Gait:  Wide based gait, slight foot drop on right.  Impression/Recommendations: 1.  Spells of dizziness - I still think that these are vestibular in nature, although really it is really hard to determine where they are coming from.  Her evaluation is confounding by her severe sight impairment as well as well as foot drop and probable diabetic peripheral neuropathy.  I think that we need to rule out basilar insufficiency so I am going to proceed with a CTA. It is notable that on a 2007 angiogram she had bilateral stenosis of her ICAs thought secondary to the hemorrhage.  It will be interesting to see if this is still the case.  The patient will continue with rehab.  We will see the patient back in 2 months.  Lupita Raider Modesto Charon, MD [facility], [place]

## 2012-03-02 NOTE — Patient Instructions (Signed)
Go to the basement to have your labs drawn today.  Your CTA's are scheduled for Friday, April 19th at 10:15am.  Please arrive to [facility] at 10:15am.  [address]  [phone].

## 2012-03-03 ENCOUNTER — Encounter: Payer: Medicare Other | Admitting: Physical Therapy

## 2012-03-03 ENCOUNTER — Ambulatory Visit: Payer: Medicare Other | Admitting: Physical Therapy

## 2012-03-04 ENCOUNTER — Ambulatory Visit
Admission: RE | Admit: 2012-03-04 | Discharge: 2012-03-04 | Disposition: A | Discharge status: Home / Self Care | Payer: Medicare Other | Source: Ambulatory Visit | Attending: Neurology | Admitting: Neurology

## 2012-03-04 DIAGNOSIS — R42 Dizziness and giddiness: Secondary | ICD-10-CM

## 2012-03-04 MED ORDER — IOHEXOL 350 MG/ML SOLN
100.0000 mL | Freq: Once | INTRAVENOUS | Status: AC | PRN
  Administered 2012-03-04: 100 mL via INTRAVENOUS

## 2012-03-08 ENCOUNTER — Ambulatory Visit: Payer: Medicare Other | Admitting: Physical Therapy

## 2012-03-09 ENCOUNTER — Telehealth: Payer: Self-pay | Admitting: Neurology

## 2012-03-09 NOTE — Telephone Encounter (Signed)
Message copied by Benay Spice on Wed Mar 09, 2012  8:58 AM ------      Message from: Milas Gain      Created: Tue Mar 08, 2012 11:02 PM       Let Ms. Haidar know that while there was some mild atherosclerosis of her arteries in her brain and neck, nothing that would cause her dizziness.

## 2012-03-09 NOTE — Telephone Encounter (Signed)
Called and spoke with the patient. Information given as per Dr. Modesto Charon re: CTA results. The patient states she is doing well with therapy. No other concerns/issues voiced at this time.

## 2012-03-10 ENCOUNTER — Ambulatory Visit: Payer: Medicare Other | Admitting: Physical Therapy

## 2012-03-15 ENCOUNTER — Ambulatory Visit: Payer: Medicare Other | Admitting: Physical Therapy

## 2012-03-17 ENCOUNTER — Ambulatory Visit: Payer: Medicare Other | Attending: Neurology | Admitting: Physical Therapy

## 2012-03-17 DIAGNOSIS — R269 Unspecified abnormalities of gait and mobility: Secondary | ICD-10-CM | POA: Insufficient documentation

## 2012-03-17 DIAGNOSIS — R42 Dizziness and giddiness: Secondary | ICD-10-CM | POA: Insufficient documentation

## 2012-03-17 DIAGNOSIS — IMO0001 Reserved for inherently not codable concepts without codable children: Secondary | ICD-10-CM | POA: Insufficient documentation

## 2012-03-22 ENCOUNTER — Ambulatory Visit: Payer: Medicare Other | Admitting: Physical Therapy

## 2012-03-24 ENCOUNTER — Ambulatory Visit: Payer: Medicare Other | Admitting: Physical Therapy

## 2012-03-29 ENCOUNTER — Ambulatory Visit: Payer: Medicare Other | Admitting: Physical Therapy

## 2012-03-31 ENCOUNTER — Ambulatory Visit: Payer: Medicare Other | Admitting: Physical Therapy

## 2012-05-06 ENCOUNTER — Ambulatory Visit: Payer: Medicare Other | Admitting: Neurology

## 2012-06-10 ENCOUNTER — Encounter: Payer: Self-pay | Admitting: Neurology

## 2012-06-10 ENCOUNTER — Ambulatory Visit (INDEPENDENT_AMBULATORY_CARE_PROVIDER_SITE_OTHER): Payer: Medicare Other | Admitting: Neurology

## 2012-06-10 VITALS — BP 126/72 | HR 72 | Ht 67.0 in | Wt 246.0 lb

## 2012-06-10 DIAGNOSIS — H8190 Unspecified disorder of vestibular function, unspecified ear: Secondary | ICD-10-CM

## 2012-06-10 DIAGNOSIS — H9390 Unspecified disorder of ear, unspecified ear: Secondary | ICD-10-CM

## 2012-06-10 NOTE — Progress Notes (Signed)
Dear Dr. Eloise Harman,   I saw [name] back in [facility] for her problem with dizziness. As you may recall, she is a 61 y.o. 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 She describes it as a feeling of the walls moving. The sensation only lasts seconds. However she finds herself reaching for walls constantly for fear of the sensation coming on. She denies frank vertigo. She says that it is worse when she is looking at white walls. Generally if she stops walking the sensation gets better. She does not feel it has been progressive. She denies any problems with me change in hearing. She gets infrequent tinnitus is not correlated with these spells. She has had no significant change in medication before this began.  She's had no recent changes in her vision. She's never had a history of vertigo. She did have a burst eardrum from otitis media when she was a child.  When I first saw her I ordered an MRI of her brain. There was no signs of an abnormality of the 8th nerve complex or ischemic stroke or superficial siderosis.  I also got a CTA of her head and neck, and did not reveal any stenosis that might account for her symptoms.  She has completed gait therapy which she feels has helped her overall steadiness, but she still gets the "dizziness" sensations multiple times per day.  She does not notice them lying down.  They are typically when she is walking, but not only associated with sudden head movements.  Medical history, social history, and family history were reviewed and have not changed since the last clinic visit.  Current Outpatient Prescriptions on File Prior to Visit  Medication Sig Dispense Refill  . calcium-vitamin D 250-100 MG-UNIT per tablet Take 1 tablet by mouth 2 (two) times daily.        . fish oil-omega-3 fatty acids 1000 MG capsule Take 2 g by mouth daily.      Marland Kitchen glucosamine-chondroitin 500-400 MG tablet Take 1 tablet by mouth 3 (three) times daily.      . insulin glargine (LANTUS) 100 UNIT/ML injection Inject 10 Units into the skin at bedtime.      . insulin lispro (HUMALOG) 100 UNIT/ML injection Inject into the skin 3 (three) times daily before meals.      . latanoprost (XALATAN) 0.005 % ophthalmic solution 1 drop at bedtime.      Marland Kitchen lisinopril-hydrochlorothiazide (PRINZIDE,ZESTORETIC) 10-12.5 MG per tablet Take 1 tablet by mouth daily.      . metoprolol tartrate (LOPRESSOR) 25 MG tablet Take 25 mg by mouth 2 (two) times daily.      . Multiple Vitamin (MULTIVITAMIN) tablet Take 1 tablet by mouth daily.      . pravastatin (PRAVACHOL) 40 MG tablet Take 40 mg by mouth daily.      . raloxifene (EVISTA) 60 MG tablet Take 60 mg by mouth daily.      . vitamin C (ASCORBIC ACID) 500 MG tablet Take 500 mg by mouth daily.        Allergies  Allergen Reactions  . Codeine   . Tape     ROS:  13 systems were reviewed and are notable for right foot drop.  All other review of systems are unremarkable.  Exam: . Filed Vitals:   06/10/12 1002  BP: 126/72  Pulse: 72  Height: 5\' 7"  (1.702 m)  Weight: 246 lb (111.585 kg)   In general, well nourished appearing women, very cheerful.  Mental status:  The patient is oriented to person, place and time. Recent and remote memory are intact. Attention span and concentration are normal. Language including repetition, naming, following commands are intact.  Fund of knowledge of current and historical events, as well as vocabulary are normal.  Cranial Nerves:  Right pupil unreactive to light. NLP OD. EOMS full bilaterally, nystagmus on left lateral gaze.. Facial sensation and muscles of mastication are intact. Muscles of facial expression are symmetric. Hearing intact to bilateral finger rub. Tongue protrusion, uvula, palate midline. Shoulder  shrug intact  Motor:  Full strength upper extremities except reduced ROM in right shoulder. Lower extremities 4- HF bilaterally. KE, KF full. FDF right 4, left 4+.  Reflexes: 2+ UE, absent right patella, 2+ left patella, absent ankles.  Coordination: Normal finger to nose  Gait: Wide based gait, slight foot drop on right. Sensation:  impaired position sense in both toes.  Head thrust - did not appreciate catch up saccade.  Impression/Recommendations:  1.  Spells of dizziness - Despite lack of evidence, I still suspect a vestibulopathy, perhaps secondary to her perimesencephalic hemorrhage.  I am going to talk to Percell Boston who originally saw her for vestibular therapy to get her opinion about her condition.  If she thinks she might benefit from more vestibular therapy I will make that recommendation.  In order to further investigate a vestibulopathy I would normally send someone to neuro-otology at [facility].  However, the patient would like to hold off for now.  Unfortunately I will be leaving this practice and taking an academic position at [employer].  The patient will let me know in the meantime before I leave in September if she has any worsening.    Lupita Raider Modesto Charon, MD [facility], [place]  ADDENDUM:  I did speak to the vestibular therapist who evaluated her.  She felt with her comorbidities that vestibular therapy would be difficult and felt that should would benefit from general balance therapy more(which she did).  She would be happy to re-evaluate at a later date however.

## 2015-12-20 DIAGNOSIS — Z1231 Encounter for screening mammogram for malignant neoplasm of breast: Secondary | ICD-10-CM | POA: Diagnosis not present

## 2016-01-23 DIAGNOSIS — E1151 Type 2 diabetes mellitus with diabetic peripheral angiopathy without gangrene: Secondary | ICD-10-CM | POA: Diagnosis not present

## 2016-01-23 DIAGNOSIS — E784 Other hyperlipidemia: Secondary | ICD-10-CM | POA: Diagnosis not present

## 2016-01-23 DIAGNOSIS — I10 Essential (primary) hypertension: Secondary | ICD-10-CM | POA: Diagnosis not present

## 2016-01-23 DIAGNOSIS — Z6839 Body mass index (BMI) 39.0-39.9, adult: Secondary | ICD-10-CM | POA: Diagnosis not present

## 2016-01-23 DIAGNOSIS — I7389 Other specified peripheral vascular diseases: Secondary | ICD-10-CM | POA: Diagnosis not present

## 2016-02-06 DIAGNOSIS — Z961 Presence of intraocular lens: Secondary | ICD-10-CM | POA: Diagnosis not present

## 2016-02-19 DIAGNOSIS — E1351 Other specified diabetes mellitus with diabetic peripheral angiopathy without gangrene: Secondary | ICD-10-CM | POA: Diagnosis not present

## 2016-02-19 DIAGNOSIS — L602 Onychogryphosis: Secondary | ICD-10-CM | POA: Diagnosis not present

## 2016-04-14 DIAGNOSIS — M25562 Pain in left knee: Secondary | ICD-10-CM | POA: Diagnosis not present

## 2016-04-14 DIAGNOSIS — M25561 Pain in right knee: Secondary | ICD-10-CM | POA: Diagnosis not present

## 2016-04-14 DIAGNOSIS — M25461 Effusion, right knee: Secondary | ICD-10-CM | POA: Diagnosis not present

## 2016-04-16 ENCOUNTER — Other Ambulatory Visit (HOSPITAL_COMMUNITY): Payer: Self-pay | Admitting: Orthopaedic Surgery

## 2016-04-16 DIAGNOSIS — T84032D Mechanical loosening of internal right knee prosthetic joint, subsequent encounter: Secondary | ICD-10-CM

## 2016-04-24 ENCOUNTER — Encounter (HOSPITAL_COMMUNITY): Payer: Medicare Other

## 2016-04-24 ENCOUNTER — Encounter (HOSPITAL_COMMUNITY)
Admission: RE | Admit: 2016-04-24 | Discharge: 2016-04-24 | Disposition: A | Discharge status: Home / Self Care | Payer: Medicare Other | Source: Ambulatory Visit | Attending: Orthopaedic Surgery | Admitting: Orthopaedic Surgery

## 2016-04-24 DIAGNOSIS — T84032D Mechanical loosening of internal right knee prosthetic joint, subsequent encounter: Secondary | ICD-10-CM | POA: Insufficient documentation

## 2016-04-24 DIAGNOSIS — X58XXXD Exposure to other specified factors, subsequent encounter: Secondary | ICD-10-CM | POA: Insufficient documentation

## 2016-04-24 DIAGNOSIS — M25569 Pain in unspecified knee: Secondary | ICD-10-CM | POA: Diagnosis not present

## 2016-04-24 MED ORDER — TECHNETIUM TC 99M MEDRONATE IV KIT: 25.0000 | PACK | Freq: Once | INTRAVENOUS | Status: DC | PRN

## 2016-04-27 DIAGNOSIS — E109 Type 1 diabetes mellitus without complications: Secondary | ICD-10-CM | POA: Diagnosis not present

## 2016-05-11 DIAGNOSIS — M25561 Pain in right knee: Secondary | ICD-10-CM | POA: Diagnosis not present

## 2016-05-25 DIAGNOSIS — I70293 Other atherosclerosis of native arteries of extremities, bilateral legs: Secondary | ICD-10-CM | POA: Diagnosis not present

## 2016-05-25 DIAGNOSIS — L602 Onychogryphosis: Secondary | ICD-10-CM | POA: Diagnosis not present

## 2016-05-25 DIAGNOSIS — E1351 Other specified diabetes mellitus with diabetic peripheral angiopathy without gangrene: Secondary | ICD-10-CM | POA: Diagnosis not present

## 2016-05-25 DIAGNOSIS — L84 Corns and callosities: Secondary | ICD-10-CM | POA: Diagnosis not present

## 2016-06-11 DIAGNOSIS — H26492 Other secondary cataract, left eye: Secondary | ICD-10-CM | POA: Diagnosis not present

## 2016-06-18 DIAGNOSIS — E1151 Type 2 diabetes mellitus with diabetic peripheral angiopathy without gangrene: Secondary | ICD-10-CM | POA: Diagnosis not present

## 2016-06-18 DIAGNOSIS — R8299 Other abnormal findings in urine: Secondary | ICD-10-CM | POA: Diagnosis not present

## 2016-06-18 DIAGNOSIS — E784 Other hyperlipidemia: Secondary | ICD-10-CM | POA: Diagnosis not present

## 2016-06-18 DIAGNOSIS — M859 Disorder of bone density and structure, unspecified: Secondary | ICD-10-CM | POA: Diagnosis not present

## 2016-06-18 DIAGNOSIS — N390 Urinary tract infection, site not specified: Secondary | ICD-10-CM | POA: Diagnosis not present

## 2016-06-18 DIAGNOSIS — I10 Essential (primary) hypertension: Secondary | ICD-10-CM | POA: Diagnosis not present

## 2016-06-25 DIAGNOSIS — Z6841 Body Mass Index (BMI) 40.0 and over, adult: Secondary | ICD-10-CM | POA: Diagnosis not present

## 2016-06-25 DIAGNOSIS — Z0000 Encounter for general adult medical examination without abnormal findings: Secondary | ICD-10-CM | POA: Diagnosis not present

## 2016-06-25 DIAGNOSIS — E109 Type 1 diabetes mellitus without complications: Secondary | ICD-10-CM | POA: Diagnosis not present

## 2016-06-25 DIAGNOSIS — M859 Disorder of bone density and structure, unspecified: Secondary | ICD-10-CM | POA: Diagnosis not present

## 2016-06-25 DIAGNOSIS — I739 Peripheral vascular disease, unspecified: Secondary | ICD-10-CM | POA: Diagnosis not present

## 2016-06-25 DIAGNOSIS — M545 Low back pain: Secondary | ICD-10-CM | POA: Diagnosis not present

## 2016-06-25 DIAGNOSIS — Z1389 Encounter for screening for other disorder: Secondary | ICD-10-CM | POA: Diagnosis not present

## 2016-06-25 DIAGNOSIS — M25561 Pain in right knee: Secondary | ICD-10-CM | POA: Diagnosis not present

## 2016-06-25 DIAGNOSIS — I10 Essential (primary) hypertension: Secondary | ICD-10-CM | POA: Diagnosis not present

## 2016-06-25 DIAGNOSIS — E784 Other hyperlipidemia: Secondary | ICD-10-CM | POA: Diagnosis not present

## 2016-06-29 DIAGNOSIS — Z1212 Encounter for screening for malignant neoplasm of rectum: Secondary | ICD-10-CM | POA: Diagnosis not present

## 2016-07-06 ENCOUNTER — Encounter (INDEPENDENT_AMBULATORY_CARE_PROVIDER_SITE_OTHER): Payer: Medicare Other | Admitting: Ophthalmology

## 2016-07-06 DIAGNOSIS — H43813 Vitreous degeneration, bilateral: Secondary | ICD-10-CM

## 2016-07-06 DIAGNOSIS — H26492 Other secondary cataract, left eye: Secondary | ICD-10-CM

## 2016-07-06 DIAGNOSIS — E103592 Type 1 diabetes mellitus with proliferative diabetic retinopathy without macular edema, left eye: Secondary | ICD-10-CM

## 2016-07-06 DIAGNOSIS — I10 Essential (primary) hypertension: Secondary | ICD-10-CM

## 2016-07-06 DIAGNOSIS — E10311 Type 1 diabetes mellitus with unspecified diabetic retinopathy with macular edema: Secondary | ICD-10-CM | POA: Diagnosis not present

## 2016-07-06 DIAGNOSIS — E103521 Type 1 diabetes mellitus with proliferative diabetic retinopathy with traction retinal detachment involving the macula, right eye: Secondary | ICD-10-CM | POA: Diagnosis not present

## 2016-07-06 DIAGNOSIS — H35033 Hypertensive retinopathy, bilateral: Secondary | ICD-10-CM

## 2016-07-15 ENCOUNTER — Ambulatory Visit (INDEPENDENT_AMBULATORY_CARE_PROVIDER_SITE_OTHER): Payer: Medicare Other | Admitting: Ophthalmology

## 2016-07-15 DIAGNOSIS — H2702 Aphakia, left eye: Secondary | ICD-10-CM

## 2016-08-24 DIAGNOSIS — L602 Onychogryphosis: Secondary | ICD-10-CM | POA: Diagnosis not present

## 2016-08-24 DIAGNOSIS — I70293 Other atherosclerosis of native arteries of extremities, bilateral legs: Secondary | ICD-10-CM | POA: Diagnosis not present

## 2016-08-24 DIAGNOSIS — E1351 Other specified diabetes mellitus with diabetic peripheral angiopathy without gangrene: Secondary | ICD-10-CM | POA: Diagnosis not present

## 2016-08-24 DIAGNOSIS — L84 Corns and callosities: Secondary | ICD-10-CM | POA: Diagnosis not present

## 2016-09-10 ENCOUNTER — Other Ambulatory Visit: Payer: Self-pay | Admitting: Family

## 2016-09-12 ENCOUNTER — Other Ambulatory Visit: Payer: Self-pay | Admitting: Family

## 2016-09-14 ENCOUNTER — Other Ambulatory Visit: Payer: Self-pay | Admitting: Family

## 2016-09-23 ENCOUNTER — Other Ambulatory Visit: Payer: Self-pay | Admitting: Family

## 2016-09-24 ENCOUNTER — Other Ambulatory Visit: Payer: Self-pay | Admitting: Family

## 2016-10-27 DIAGNOSIS — L602 Onychogryphosis: Secondary | ICD-10-CM | POA: Diagnosis not present

## 2016-10-27 DIAGNOSIS — E1151 Type 2 diabetes mellitus with diabetic peripheral angiopathy without gangrene: Secondary | ICD-10-CM | POA: Diagnosis not present

## 2016-10-27 DIAGNOSIS — L84 Corns and callosities: Secondary | ICD-10-CM | POA: Diagnosis not present

## 2016-11-13 DIAGNOSIS — I872 Venous insufficiency (chronic) (peripheral): Secondary | ICD-10-CM | POA: Diagnosis not present

## 2016-11-13 DIAGNOSIS — R238 Other skin changes: Secondary | ICD-10-CM | POA: Diagnosis not present

## 2016-11-13 DIAGNOSIS — R262 Difficulty in walking, not elsewhere classified: Secondary | ICD-10-CM | POA: Diagnosis not present

## 2016-11-13 DIAGNOSIS — I70293 Other atherosclerosis of native arteries of extremities, bilateral legs: Secondary | ICD-10-CM | POA: Diagnosis not present

## 2016-11-20 DIAGNOSIS — I872 Venous insufficiency (chronic) (peripheral): Secondary | ICD-10-CM | POA: Diagnosis not present

## 2016-11-20 DIAGNOSIS — R238 Other skin changes: Secondary | ICD-10-CM | POA: Diagnosis not present

## 2016-11-20 DIAGNOSIS — I70293 Other atherosclerosis of native arteries of extremities, bilateral legs: Secondary | ICD-10-CM | POA: Diagnosis not present

## 2016-11-20 DIAGNOSIS — R262 Difficulty in walking, not elsewhere classified: Secondary | ICD-10-CM | POA: Diagnosis not present

## 2016-12-11 DIAGNOSIS — I872 Venous insufficiency (chronic) (peripheral): Secondary | ICD-10-CM | POA: Diagnosis not present

## 2016-12-11 DIAGNOSIS — R262 Difficulty in walking, not elsewhere classified: Secondary | ICD-10-CM | POA: Diagnosis not present

## 2016-12-11 DIAGNOSIS — R238 Other skin changes: Secondary | ICD-10-CM | POA: Diagnosis not present

## 2016-12-11 DIAGNOSIS — I70293 Other atherosclerosis of native arteries of extremities, bilateral legs: Secondary | ICD-10-CM | POA: Diagnosis not present

## 2016-12-31 DIAGNOSIS — E109 Type 1 diabetes mellitus without complications: Secondary | ICD-10-CM | POA: Diagnosis not present

## 2017-01-04 DIAGNOSIS — M25561 Pain in right knee: Secondary | ICD-10-CM | POA: Diagnosis not present

## 2017-01-04 DIAGNOSIS — M21371 Foot drop, right foot: Secondary | ICD-10-CM | POA: Diagnosis not present

## 2017-01-04 DIAGNOSIS — E109 Type 1 diabetes mellitus without complications: Secondary | ICD-10-CM | POA: Diagnosis not present

## 2017-01-13 DIAGNOSIS — R262 Difficulty in walking, not elsewhere classified: Secondary | ICD-10-CM | POA: Diagnosis not present

## 2017-01-13 DIAGNOSIS — I70293 Other atherosclerosis of native arteries of extremities, bilateral legs: Secondary | ICD-10-CM | POA: Diagnosis not present

## 2017-01-13 DIAGNOSIS — I872 Venous insufficiency (chronic) (peripheral): Secondary | ICD-10-CM | POA: Diagnosis not present

## 2017-01-13 DIAGNOSIS — R238 Other skin changes: Secondary | ICD-10-CM | POA: Diagnosis not present

## 2017-02-01 DIAGNOSIS — Q809 Congenital ichthyosis, unspecified: Secondary | ICD-10-CM | POA: Diagnosis not present

## 2017-02-01 DIAGNOSIS — R262 Difficulty in walking, not elsewhere classified: Secondary | ICD-10-CM | POA: Diagnosis not present

## 2017-02-01 DIAGNOSIS — D2371 Other benign neoplasm of skin of right lower limb, including hip: Secondary | ICD-10-CM | POA: Diagnosis not present

## 2017-02-01 DIAGNOSIS — L97511 Non-pressure chronic ulcer of other part of right foot limited to breakdown of skin: Secondary | ICD-10-CM | POA: Diagnosis not present

## 2017-02-01 DIAGNOSIS — E11621 Type 2 diabetes mellitus with foot ulcer: Secondary | ICD-10-CM | POA: Diagnosis not present

## 2017-02-09 DIAGNOSIS — Q809 Congenital ichthyosis, unspecified: Secondary | ICD-10-CM | POA: Diagnosis not present

## 2017-02-23 DIAGNOSIS — I70293 Other atherosclerosis of native arteries of extremities, bilateral legs: Secondary | ICD-10-CM | POA: Diagnosis not present

## 2017-02-23 DIAGNOSIS — R238 Other skin changes: Secondary | ICD-10-CM | POA: Diagnosis not present

## 2017-02-23 DIAGNOSIS — R262 Difficulty in walking, not elsewhere classified: Secondary | ICD-10-CM | POA: Diagnosis not present

## 2017-02-23 DIAGNOSIS — I872 Venous insufficiency (chronic) (peripheral): Secondary | ICD-10-CM | POA: Diagnosis not present

## 2017-03-09 DIAGNOSIS — I70293 Other atherosclerosis of native arteries of extremities, bilateral legs: Secondary | ICD-10-CM | POA: Diagnosis not present

## 2017-03-09 DIAGNOSIS — B351 Tinea unguium: Secondary | ICD-10-CM | POA: Diagnosis not present

## 2017-03-09 DIAGNOSIS — E1351 Other specified diabetes mellitus with diabetic peripheral angiopathy without gangrene: Secondary | ICD-10-CM | POA: Diagnosis not present

## 2017-03-09 DIAGNOSIS — R262 Difficulty in walking, not elsewhere classified: Secondary | ICD-10-CM | POA: Diagnosis not present

## 2017-04-08 DIAGNOSIS — E109 Type 1 diabetes mellitus without complications: Secondary | ICD-10-CM | POA: Diagnosis not present

## 2017-04-08 DIAGNOSIS — I7389 Other specified peripheral vascular diseases: Secondary | ICD-10-CM | POA: Diagnosis not present

## 2017-04-08 DIAGNOSIS — M25562 Pain in left knee: Secondary | ICD-10-CM | POA: Diagnosis not present

## 2017-04-08 DIAGNOSIS — I10 Essential (primary) hypertension: Secondary | ICD-10-CM | POA: Diagnosis not present

## 2017-04-14 DIAGNOSIS — R262 Difficulty in walking, not elsewhere classified: Secondary | ICD-10-CM | POA: Diagnosis not present

## 2017-04-14 DIAGNOSIS — I872 Venous insufficiency (chronic) (peripheral): Secondary | ICD-10-CM | POA: Diagnosis not present

## 2017-04-14 DIAGNOSIS — I70293 Other atherosclerosis of native arteries of extremities, bilateral legs: Secondary | ICD-10-CM | POA: Diagnosis not present

## 2017-04-14 DIAGNOSIS — E1351 Other specified diabetes mellitus with diabetic peripheral angiopathy without gangrene: Secondary | ICD-10-CM | POA: Diagnosis not present

## 2017-04-30 DIAGNOSIS — H25091 Other age-related incipient cataract, right eye: Secondary | ICD-10-CM | POA: Diagnosis not present

## 2017-04-30 DIAGNOSIS — E133593 Other specified diabetes mellitus with proliferative diabetic retinopathy without macular edema, bilateral: Secondary | ICD-10-CM | POA: Diagnosis not present

## 2017-04-30 DIAGNOSIS — E109 Type 1 diabetes mellitus without complications: Secondary | ICD-10-CM | POA: Diagnosis not present

## 2017-04-30 DIAGNOSIS — Z961 Presence of intraocular lens: Secondary | ICD-10-CM | POA: Diagnosis not present

## 2017-05-18 DIAGNOSIS — I872 Venous insufficiency (chronic) (peripheral): Secondary | ICD-10-CM | POA: Diagnosis not present

## 2017-05-18 DIAGNOSIS — R262 Difficulty in walking, not elsewhere classified: Secondary | ICD-10-CM | POA: Diagnosis not present

## 2017-05-18 DIAGNOSIS — E1351 Other specified diabetes mellitus with diabetic peripheral angiopathy without gangrene: Secondary | ICD-10-CM | POA: Diagnosis not present

## 2017-05-18 DIAGNOSIS — I70293 Other atherosclerosis of native arteries of extremities, bilateral legs: Secondary | ICD-10-CM | POA: Diagnosis not present

## 2017-05-21 DIAGNOSIS — E109 Type 1 diabetes mellitus without complications: Secondary | ICD-10-CM | POA: Diagnosis not present

## 2017-05-26 ENCOUNTER — Other Ambulatory Visit: Payer: Self-pay

## 2017-05-26 DIAGNOSIS — I872 Venous insufficiency (chronic) (peripheral): Secondary | ICD-10-CM

## 2017-05-31 ENCOUNTER — Other Ambulatory Visit: Payer: Self-pay | Admitting: Podiatry

## 2017-05-31 DIAGNOSIS — I872 Venous insufficiency (chronic) (peripheral): Secondary | ICD-10-CM

## 2017-06-22 ENCOUNTER — Other Ambulatory Visit: Payer: Self-pay | Admitting: Podiatry

## 2017-06-22 ENCOUNTER — Other Ambulatory Visit: Payer: Medicare Other

## 2017-06-22 DIAGNOSIS — I872 Venous insufficiency (chronic) (peripheral): Secondary | ICD-10-CM

## 2017-06-24 DIAGNOSIS — I872 Venous insufficiency (chronic) (peripheral): Secondary | ICD-10-CM | POA: Diagnosis not present

## 2017-06-24 DIAGNOSIS — B351 Tinea unguium: Secondary | ICD-10-CM | POA: Diagnosis not present

## 2017-06-24 DIAGNOSIS — R262 Difficulty in walking, not elsewhere classified: Secondary | ICD-10-CM | POA: Diagnosis not present

## 2017-06-24 DIAGNOSIS — I70293 Other atherosclerosis of native arteries of extremities, bilateral legs: Secondary | ICD-10-CM | POA: Diagnosis not present

## 2017-06-30 ENCOUNTER — Ambulatory Visit
Admission: RE | Admit: 2017-06-30 | Discharge: 2017-06-30 | Disposition: A | Discharge status: Home / Self Care | Payer: Medicare Other | Source: Ambulatory Visit | Attending: Podiatry | Admitting: Podiatry

## 2017-06-30 DIAGNOSIS — I872 Venous insufficiency (chronic) (peripheral): Secondary | ICD-10-CM | POA: Diagnosis not present

## 2017-06-30 DIAGNOSIS — M7989 Other specified soft tissue disorders: Secondary | ICD-10-CM | POA: Diagnosis not present
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
## 2017-07-02 DIAGNOSIS — E784 Other hyperlipidemia: Secondary | ICD-10-CM | POA: Diagnosis not present

## 2017-07-02 DIAGNOSIS — M859 Disorder of bone density and structure, unspecified: Secondary | ICD-10-CM | POA: Diagnosis not present

## 2017-07-02 DIAGNOSIS — E109 Type 1 diabetes mellitus without complications: Secondary | ICD-10-CM | POA: Diagnosis not present

## 2017-07-05 DIAGNOSIS — Z1231 Encounter for screening mammogram for malignant neoplasm of breast: Secondary | ICD-10-CM | POA: Diagnosis not present

## 2017-07-13 DIAGNOSIS — Z0000 Encounter for general adult medical examination without abnormal findings: Secondary | ICD-10-CM | POA: Diagnosis not present

## 2017-07-13 DIAGNOSIS — E109 Type 1 diabetes mellitus without complications: Secondary | ICD-10-CM | POA: Diagnosis not present

## 2017-07-13 DIAGNOSIS — I83009 Varicose veins of unspecified lower extremity with ulcer of unspecified site: Secondary | ICD-10-CM | POA: Diagnosis not present

## 2017-07-13 DIAGNOSIS — I872 Venous insufficiency (chronic) (peripheral): Secondary | ICD-10-CM | POA: Diagnosis not present

## 2017-07-14 ENCOUNTER — Encounter: Payer: Self-pay | Admitting: Surgery

## 2017-07-15 DIAGNOSIS — Z1212 Encounter for screening for malignant neoplasm of rectum: Secondary | ICD-10-CM | POA: Diagnosis not present

## 2017-07-21 ENCOUNTER — Ambulatory Visit (HOSPITAL_COMMUNITY)
Admission: RE | Admit: 2017-07-21 | Discharge: 2017-07-21 | Disposition: A | Discharge status: Home / Self Care | Payer: Medicare Other | Source: Ambulatory Visit | Attending: Vascular Surgery | Admitting: Vascular Surgery

## 2017-07-21 ENCOUNTER — Encounter (INDEPENDENT_AMBULATORY_CARE_PROVIDER_SITE_OTHER): Payer: Medicare Other | Admitting: Surgery

## 2017-07-21 DIAGNOSIS — I872 Venous insufficiency (chronic) (peripheral): Secondary | ICD-10-CM

## 2017-08-02 ENCOUNTER — Encounter (INDEPENDENT_AMBULATORY_CARE_PROVIDER_SITE_OTHER): Payer: Self-pay | Admitting: Orthopaedic Surgery

## 2017-08-02 ENCOUNTER — Ambulatory Visit (INDEPENDENT_AMBULATORY_CARE_PROVIDER_SITE_OTHER): Payer: Medicare Other

## 2017-08-02 ENCOUNTER — Encounter (HOSPITAL_BASED_OUTPATIENT_CLINIC_OR_DEPARTMENT_OTHER): Payer: Medicare Other | Attending: Internal Medicine

## 2017-08-02 ENCOUNTER — Ambulatory Visit (INDEPENDENT_AMBULATORY_CARE_PROVIDER_SITE_OTHER): Payer: Medicare Other | Admitting: Orthopaedic Surgery

## 2017-08-02 DIAGNOSIS — G8929 Other chronic pain: Secondary | ICD-10-CM

## 2017-08-02 DIAGNOSIS — L97819 Non-pressure chronic ulcer of other part of right lower leg with unspecified severity: Secondary | ICD-10-CM | POA: Insufficient documentation

## 2017-08-02 DIAGNOSIS — L97419 Non-pressure chronic ulcer of right heel and midfoot with unspecified severity: Secondary | ICD-10-CM | POA: Insufficient documentation

## 2017-08-02 DIAGNOSIS — E1051 Type 1 diabetes mellitus with diabetic peripheral angiopathy without gangrene: Secondary | ICD-10-CM | POA: Insufficient documentation

## 2017-08-02 DIAGNOSIS — I87331 Chronic venous hypertension (idiopathic) with ulcer and inflammation of right lower extremity: Secondary | ICD-10-CM | POA: Diagnosis not present

## 2017-08-02 DIAGNOSIS — M1712 Unilateral primary osteoarthritis, left knee: Secondary | ICD-10-CM | POA: Diagnosis not present

## 2017-08-02 DIAGNOSIS — E10621 Type 1 diabetes mellitus with foot ulcer: Secondary | ICD-10-CM | POA: Insufficient documentation

## 2017-08-02 DIAGNOSIS — L97412 Non-pressure chronic ulcer of right heel and midfoot with fat layer exposed: Secondary | ICD-10-CM | POA: Diagnosis not present

## 2017-08-02 DIAGNOSIS — M25562 Pain in left knee: Secondary | ICD-10-CM | POA: Diagnosis not present

## 2017-08-02 DIAGNOSIS — E1042 Type 1 diabetes mellitus with diabetic polyneuropathy: Secondary | ICD-10-CM | POA: Insufficient documentation

## 2017-08-02 DIAGNOSIS — E11621 Type 2 diabetes mellitus with foot ulcer: Secondary | ICD-10-CM | POA: Diagnosis not present

## 2017-08-02 MED ORDER — LIDOCAINE HCL 1 % IJ SOLN
3.0000 mL | INTRAMUSCULAR | Status: AC | PRN
  Administered 2017-08-02: 3 mL

## 2017-08-02 MED ORDER — METHYLPREDNISOLONE ACETATE 40 MG/ML IJ SUSP
40.0000 mg | INTRAMUSCULAR | Status: AC | PRN
  Administered 2017-08-02: 40 mg via INTRA_ARTICULAR
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
## 2017-08-03 ENCOUNTER — Other Ambulatory Visit (HOSPITAL_COMMUNITY): Payer: Self-pay | Admitting: Interventional Radiology

## 2017-08-03 DIAGNOSIS — I872 Venous insufficiency (chronic) (peripheral): Secondary | ICD-10-CM

## 2017-08-06 DIAGNOSIS — I87331 Chronic venous hypertension (idiopathic) with ulcer and inflammation of right lower extremity: Secondary | ICD-10-CM | POA: Diagnosis not present

## 2017-08-09 ENCOUNTER — Other Ambulatory Visit: Payer: Self-pay | Admitting: Internal Medicine

## 2017-08-09 ENCOUNTER — Ambulatory Visit (HOSPITAL_COMMUNITY)
Admission: RE | Admit: 2017-08-09 | Discharge: 2017-08-09 | Disposition: A | Discharge status: Home / Self Care | Payer: Medicare Other | Source: Ambulatory Visit | Attending: Surgery | Admitting: Surgery

## 2017-08-09 DIAGNOSIS — E1051 Type 1 diabetes mellitus with diabetic peripheral angiopathy without gangrene: Secondary | ICD-10-CM | POA: Diagnosis not present

## 2017-08-09 DIAGNOSIS — L97519 Non-pressure chronic ulcer of other part of right foot with unspecified severity: Secondary | ICD-10-CM

## 2017-08-09 DIAGNOSIS — E10621 Type 1 diabetes mellitus with foot ulcer: Secondary | ICD-10-CM | POA: Diagnosis not present

## 2017-08-09 DIAGNOSIS — E1042 Type 1 diabetes mellitus with diabetic polyneuropathy: Secondary | ICD-10-CM | POA: Diagnosis not present

## 2017-08-09 DIAGNOSIS — L97419 Non-pressure chronic ulcer of right heel and midfoot with unspecified severity: Secondary | ICD-10-CM | POA: Diagnosis not present

## 2017-08-09 DIAGNOSIS — L97412 Non-pressure chronic ulcer of right heel and midfoot with fat layer exposed: Secondary | ICD-10-CM | POA: Diagnosis not present

## 2017-08-09 DIAGNOSIS — I87331 Chronic venous hypertension (idiopathic) with ulcer and inflammation of right lower extremity: Secondary | ICD-10-CM | POA: Diagnosis not present

## 2017-08-09 DIAGNOSIS — L97819 Non-pressure chronic ulcer of other part of right lower leg with unspecified severity: Secondary | ICD-10-CM | POA: Diagnosis not present

## 2017-08-12 DIAGNOSIS — M1990 Unspecified osteoarthritis, unspecified site: Secondary | ICD-10-CM | POA: Diagnosis not present

## 2017-08-12 DIAGNOSIS — E10621 Type 1 diabetes mellitus with foot ulcer: Secondary | ICD-10-CM | POA: Diagnosis not present

## 2017-08-12 DIAGNOSIS — E1051 Type 1 diabetes mellitus with diabetic peripheral angiopathy without gangrene: Secondary | ICD-10-CM | POA: Diagnosis not present

## 2017-08-12 DIAGNOSIS — I87331 Chronic venous hypertension (idiopathic) with ulcer and inflammation of right lower extremity: Secondary | ICD-10-CM | POA: Diagnosis not present

## 2017-08-12 DIAGNOSIS — Z794 Long term (current) use of insulin: Secondary | ICD-10-CM | POA: Diagnosis not present

## 2017-08-12 DIAGNOSIS — L97412 Non-pressure chronic ulcer of right heel and midfoot with fat layer exposed: Secondary | ICD-10-CM | POA: Diagnosis not present

## 2017-08-12 DIAGNOSIS — L97211 Non-pressure chronic ulcer of right calf limited to breakdown of skin: Secondary | ICD-10-CM | POA: Diagnosis not present

## 2017-08-12 DIAGNOSIS — E1042 Type 1 diabetes mellitus with diabetic polyneuropathy: Secondary | ICD-10-CM | POA: Diagnosis not present

## 2017-08-16 ENCOUNTER — Encounter (HOSPITAL_BASED_OUTPATIENT_CLINIC_OR_DEPARTMENT_OTHER): Payer: Medicare Other | Attending: Internal Medicine

## 2017-08-16 DIAGNOSIS — I87331 Chronic venous hypertension (idiopathic) with ulcer and inflammation of right lower extremity: Secondary | ICD-10-CM | POA: Insufficient documentation

## 2017-08-16 DIAGNOSIS — L97412 Non-pressure chronic ulcer of right heel and midfoot with fat layer exposed: Secondary | ICD-10-CM | POA: Diagnosis not present

## 2017-08-16 DIAGNOSIS — E10621 Type 1 diabetes mellitus with foot ulcer: Secondary | ICD-10-CM | POA: Diagnosis not present

## 2017-08-16 DIAGNOSIS — L97819 Non-pressure chronic ulcer of other part of right lower leg with unspecified severity: Secondary | ICD-10-CM | POA: Insufficient documentation

## 2017-08-16 DIAGNOSIS — I890 Lymphedema, not elsewhere classified: Secondary | ICD-10-CM | POA: Diagnosis not present

## 2017-08-16 DIAGNOSIS — E1042 Type 1 diabetes mellitus with diabetic polyneuropathy: Secondary | ICD-10-CM | POA: Diagnosis not present

## 2017-08-16 DIAGNOSIS — E11621 Type 2 diabetes mellitus with foot ulcer: Secondary | ICD-10-CM | POA: Diagnosis not present

## 2017-08-16 DIAGNOSIS — S81801A Unspecified open wound, right lower leg, initial encounter: Secondary | ICD-10-CM | POA: Diagnosis not present

## 2017-08-16 DIAGNOSIS — L84 Corns and callosities: Secondary | ICD-10-CM | POA: Insufficient documentation

## 2017-08-16 DIAGNOSIS — I10 Essential (primary) hypertension: Secondary | ICD-10-CM | POA: Insufficient documentation

## 2017-08-16 DIAGNOSIS — E1051 Type 1 diabetes mellitus with diabetic peripheral angiopathy without gangrene: Secondary | ICD-10-CM | POA: Insufficient documentation

## 2017-08-16 DIAGNOSIS — I872 Venous insufficiency (chronic) (peripheral): Secondary | ICD-10-CM | POA: Diagnosis not present

## 2017-08-23 DIAGNOSIS — E10621 Type 1 diabetes mellitus with foot ulcer: Secondary | ICD-10-CM | POA: Diagnosis not present

## 2017-08-23 DIAGNOSIS — L97412 Non-pressure chronic ulcer of right heel and midfoot with fat layer exposed: Secondary | ICD-10-CM | POA: Diagnosis not present

## 2017-08-27 ENCOUNTER — Ambulatory Visit (INDEPENDENT_AMBULATORY_CARE_PROVIDER_SITE_OTHER): Payer: Medicare Other | Admitting: Vascular Surgery

## 2017-08-27 ENCOUNTER — Encounter: Payer: Self-pay | Admitting: Vascular Surgery

## 2017-08-27 VITALS — BP 150/73 | HR 73 | Temp 97.4°F | Resp 18 | Ht 67.0 in | Wt 251.0 lb

## 2017-08-27 DIAGNOSIS — I872 Venous insufficiency (chronic) (peripheral): Secondary | ICD-10-CM | POA: Diagnosis not present

## 2017-08-27 DIAGNOSIS — L97411 Non-pressure chronic ulcer of right heel and midfoot limited to breakdown of skin: Secondary | ICD-10-CM | POA: Diagnosis not present

## 2017-08-27 DIAGNOSIS — I83014 Varicose veins of right lower extremity with ulcer of heel and midfoot: Secondary | ICD-10-CM
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
## 2017-08-30 DIAGNOSIS — E10621 Type 1 diabetes mellitus with foot ulcer: Secondary | ICD-10-CM | POA: Diagnosis not present

## 2017-08-30 DIAGNOSIS — L97412 Non-pressure chronic ulcer of right heel and midfoot with fat layer exposed: Secondary | ICD-10-CM | POA: Diagnosis not present

## 2017-09-02 ENCOUNTER — Ambulatory Visit (INDEPENDENT_AMBULATORY_CARE_PROVIDER_SITE_OTHER): Payer: Medicare Other

## 2017-09-02 ENCOUNTER — Encounter (INDEPENDENT_AMBULATORY_CARE_PROVIDER_SITE_OTHER): Payer: Self-pay | Admitting: Physician Assistant

## 2017-09-02 ENCOUNTER — Ambulatory Visit (INDEPENDENT_AMBULATORY_CARE_PROVIDER_SITE_OTHER): Payer: Medicare Other | Admitting: Physician Assistant

## 2017-09-02 DIAGNOSIS — M25552 Pain in left hip: Secondary | ICD-10-CM | POA: Diagnosis not present

## 2017-09-02 DIAGNOSIS — M79672 Pain in left foot: Secondary | ICD-10-CM | POA: Diagnosis not present
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
## 2017-09-06 ENCOUNTER — Encounter: Payer: Self-pay | Admitting: Vascular Surgery

## 2017-09-06 ENCOUNTER — Ambulatory Visit (INDEPENDENT_AMBULATORY_CARE_PROVIDER_SITE_OTHER): Payer: Medicare Other | Admitting: Vascular Surgery

## 2017-09-06 VITALS — BP 148/65 | HR 64 | Temp 98.1°F | Resp 16 | Ht 67.0 in | Wt 251.0 lb

## 2017-09-06 DIAGNOSIS — E10621 Type 1 diabetes mellitus with foot ulcer: Secondary | ICD-10-CM | POA: Diagnosis not present

## 2017-09-06 DIAGNOSIS — I83891 Varicose veins of right lower extremities with other complications: Secondary | ICD-10-CM | POA: Diagnosis not present

## 2017-09-06 DIAGNOSIS — I83892 Varicose veins of left lower extremities with other complications: Secondary | ICD-10-CM | POA: Insufficient documentation

## 2017-09-06 DIAGNOSIS — L97412 Non-pressure chronic ulcer of right heel and midfoot with fat layer exposed: Secondary | ICD-10-CM | POA: Diagnosis not present
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
## 2017-09-08 ENCOUNTER — Other Ambulatory Visit: Payer: Self-pay | Admitting: *Deleted

## 2017-09-08 DIAGNOSIS — I83891 Varicose veins of right lower extremities with other complications: Secondary | ICD-10-CM

## 2017-09-13 ENCOUNTER — Ambulatory Visit (INDEPENDENT_AMBULATORY_CARE_PROVIDER_SITE_OTHER): Payer: Medicare Other | Admitting: Vascular Surgery

## 2017-09-13 ENCOUNTER — Encounter: Payer: Self-pay | Admitting: Vascular Surgery

## 2017-09-13 VITALS — BP 140/70 | HR 68 | Temp 97.2°F | Resp 18 | Ht 67.0 in | Wt 251.0 lb

## 2017-09-13 DIAGNOSIS — I80231 Phlebitis and thrombophlebitis of right tibial vein: Secondary | ICD-10-CM | POA: Diagnosis not present

## 2017-09-13 DIAGNOSIS — I83891 Varicose veins of right lower extremities with other complications: Secondary | ICD-10-CM | POA: Diagnosis not present

## 2017-09-13 DIAGNOSIS — L89619 Pressure ulcer of right heel, unspecified stage: Secondary | ICD-10-CM | POA: Diagnosis not present

## 2017-09-13 DIAGNOSIS — E10621 Type 1 diabetes mellitus with foot ulcer: Secondary | ICD-10-CM | POA: Diagnosis not present

## 2017-09-13 HISTORY — PX: ENDOVENOUS ABLATION SAPHENOUS VEIN W/ LASER: SUR449

## 2017-09-13 NOTE — Progress Notes (Signed)
Laser Ablation Procedure    Date: 09/13/2017   Brandi LeveringMarlee Villacis DOB:1951-11-02  Consent signed: Yes    Surgeon:  Dr. Quita SkyeJames D. Hart RochesterLawson  Procedure: Laser Ablation: right Greater Saphenous Vein  BP 140/70 (BP Location: Left Arm, Patient Position: Sitting, Cuff Size: Large)   Pulse 68   Temp (!) 97.2 F (36.2 C) (Oral)   Resp 18   Ht 5\' 7"  (1.702 m)   Wt 251 lb (113.9 kg)   SpO2 98%   BMI 39.31 kg/m   Tumescent Anesthesia: 450 cc 0.9% NaCl with 25 cc Lidocaine HCL  2% and 15 cc 8.4% NaHCO3  Local Anesthesia: 4 cc Lidocaine HCL and NaHCO3 (ratio 2:1)  Pulsed Mode: 15 watts, 500ms delay, 1.0 duration  Total Energy:  2135 Joules            Total Pulses: 143               Total Time: 2:22      Patient tolerated procedure well    Description of Procedure:  After marking the course of the secondary varicosities, the patient was placed on the operating table in the supine position, and the right leg was prepped and draped in sterile fashion.   Local anesthetic was administered and under ultrasound guidance the saphenous vein was accessed with a micro needle and guide wire; then the mirco puncture sheath was placed.  A guide wire was inserted saphenofemoral junction , followed by a 5 french sheath.  The position of the sheath and then the laser fiber below the junction was confirmed using the ultrasound.  Tumescent anesthesia was administered along the course of the saphenous vein using ultrasound guidance. The patient was placed in Trendelenburg position and protective laser glasses were placed on patient and staff, and the laser was fired at 15 watts continuous mode advancing 1-782mm/second for a total of 2135 joules.        Steri strip was applied to the IV insertion site and ABD pads and Ace wrap bandages were applied over the right thigh and at the top of the saphenofemoral junction. Blood loss was less than 15 cc.  The patient ambulated out of the operating room having tolerated the  procedure well.

## 2017-09-13 NOTE — Progress Notes (Signed)
Subjective:     Patient ID: [name], female   DOB: 24-Feb-1951, 66 y.o.   MRN: [id number]  HPI This 66 year old female had laser ablation right great saphenous vein from the distal thigh to near the saphenofemoral junction performed under local tumescent anesthesia. A total of 2150 J of energy was utilized. She tolerated the procedure well. Review of Systems     Objective:   Physical Exam BP 140/70 (BP Location: Left Arm, Patient Position: Sitting, Cuff Size: Large)   Pulse 68   Temp (!) 97.2 F (36.2 C) (Oral)   Resp 18   Ht 5\' 7"  (1.702 m)   Wt 251 lb (113.9 kg)   SpO2 98%   BMI 39.31 kg/m       Assessment:     Well-tolerated laser ablation right great saphenous vein performed under local tumescent anesthesia for nonhealing ulcer    Plan:     Return in 1 week for venous duplex exam to confirm closure right great saphenous vein Patient will then continue with dressing changes at the wound center

## 2017-09-20 ENCOUNTER — Encounter (HOSPITAL_BASED_OUTPATIENT_CLINIC_OR_DEPARTMENT_OTHER): Payer: Medicare Other | Attending: Internal Medicine

## 2017-09-20 ENCOUNTER — Encounter: Payer: Self-pay | Admitting: Vascular Surgery

## 2017-09-20 ENCOUNTER — Ambulatory Visit (INDEPENDENT_AMBULATORY_CARE_PROVIDER_SITE_OTHER): Payer: Medicare Other | Admitting: Vascular Surgery

## 2017-09-20 ENCOUNTER — Ambulatory Visit (HOSPITAL_COMMUNITY)
Admission: RE | Admit: 2017-09-20 | Discharge: 2017-09-20 | Disposition: A | Discharge status: Home / Self Care | Payer: Medicare Other | Source: Ambulatory Visit | Attending: Vascular Surgery | Admitting: Vascular Surgery

## 2017-09-20 VITALS — BP 152/70 | HR 65 | Temp 97.0°F | Resp 18 | Ht 67.0 in | Wt 251.0 lb

## 2017-09-20 DIAGNOSIS — I10 Essential (primary) hypertension: Secondary | ICD-10-CM | POA: Insufficient documentation

## 2017-09-20 DIAGNOSIS — I83891 Varicose veins of right lower extremities with other complications: Secondary | ICD-10-CM | POA: Insufficient documentation

## 2017-09-20 DIAGNOSIS — L84 Corns and callosities: Secondary | ICD-10-CM | POA: Diagnosis not present

## 2017-09-20 DIAGNOSIS — E1042 Type 1 diabetes mellitus with diabetic polyneuropathy: Secondary | ICD-10-CM | POA: Insufficient documentation

## 2017-09-20 DIAGNOSIS — E1051 Type 1 diabetes mellitus with diabetic peripheral angiopathy without gangrene: Secondary | ICD-10-CM | POA: Diagnosis not present

## 2017-09-20 DIAGNOSIS — L97412 Non-pressure chronic ulcer of right heel and midfoot with fat layer exposed: Secondary | ICD-10-CM | POA: Insufficient documentation

## 2017-09-20 DIAGNOSIS — I87331 Chronic venous hypertension (idiopathic) with ulcer and inflammation of right lower extremity: Secondary | ICD-10-CM | POA: Insufficient documentation

## 2017-09-20 DIAGNOSIS — E10622 Type 1 diabetes mellitus with other skin ulcer: Secondary | ICD-10-CM | POA: Diagnosis not present

## 2017-09-20 DIAGNOSIS — I83893 Varicose veins of bilateral lower extremities with other complications: Secondary | ICD-10-CM | POA: Diagnosis not present

## 2017-09-20 DIAGNOSIS — L97312 Non-pressure chronic ulcer of right ankle with fat layer exposed: Secondary | ICD-10-CM | POA: Diagnosis not present
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
## 2017-09-27 DIAGNOSIS — L97412 Non-pressure chronic ulcer of right heel and midfoot with fat layer exposed: Secondary | ICD-10-CM | POA: Diagnosis not present

## 2017-09-27 DIAGNOSIS — E10622 Type 1 diabetes mellitus with other skin ulcer: Secondary | ICD-10-CM | POA: Diagnosis not present

## 2017-09-27 DIAGNOSIS — E11621 Type 2 diabetes mellitus with foot ulcer: Secondary | ICD-10-CM | POA: Diagnosis not present

## 2017-09-27 LAB — GLUCOSE, CAPILLARY
GLUCOSE-CAPILLARY: 50 mg/dL — AB (ref 65–99)
GLUCOSE-CAPILLARY: 69 mg/dL (ref 65–99)
Glucose-Capillary: 27 mg/dL — CL (ref 65–99)

## 2017-10-04 DIAGNOSIS — L97412 Non-pressure chronic ulcer of right heel and midfoot with fat layer exposed: Secondary | ICD-10-CM | POA: Diagnosis not present

## 2017-10-04 DIAGNOSIS — E10622 Type 1 diabetes mellitus with other skin ulcer: Secondary | ICD-10-CM | POA: Diagnosis not present

## 2017-10-04 DIAGNOSIS — E10621 Type 1 diabetes mellitus with foot ulcer: Secondary | ICD-10-CM | POA: Diagnosis not present

## 2017-10-11 DIAGNOSIS — L97412 Non-pressure chronic ulcer of right heel and midfoot with fat layer exposed: Secondary | ICD-10-CM | POA: Diagnosis not present

## 2017-10-11 DIAGNOSIS — E10622 Type 1 diabetes mellitus with other skin ulcer: Secondary | ICD-10-CM | POA: Diagnosis not present

## 2017-10-11 DIAGNOSIS — E10621 Type 1 diabetes mellitus with foot ulcer: Secondary | ICD-10-CM | POA: Diagnosis not present

## 2017-10-21 DIAGNOSIS — E109 Type 1 diabetes mellitus without complications: Secondary | ICD-10-CM | POA: Diagnosis not present

## 2017-10-25 ENCOUNTER — Encounter (HOSPITAL_BASED_OUTPATIENT_CLINIC_OR_DEPARTMENT_OTHER): Payer: Medicare Other | Attending: Internal Medicine

## 2017-10-25 DIAGNOSIS — L97412 Non-pressure chronic ulcer of right heel and midfoot with fat layer exposed: Secondary | ICD-10-CM | POA: Insufficient documentation

## 2017-10-25 DIAGNOSIS — E1036 Type 1 diabetes mellitus with diabetic cataract: Secondary | ICD-10-CM | POA: Insufficient documentation

## 2017-10-25 DIAGNOSIS — I87331 Chronic venous hypertension (idiopathic) with ulcer and inflammation of right lower extremity: Secondary | ICD-10-CM | POA: Insufficient documentation

## 2017-10-25 DIAGNOSIS — E10621 Type 1 diabetes mellitus with foot ulcer: Secondary | ICD-10-CM | POA: Insufficient documentation

## 2017-11-01 ENCOUNTER — Ambulatory Visit (INDEPENDENT_AMBULATORY_CARE_PROVIDER_SITE_OTHER): Payer: Medicare Other | Admitting: Orthopaedic Surgery

## 2017-11-01 DIAGNOSIS — L97412 Non-pressure chronic ulcer of right heel and midfoot with fat layer exposed: Secondary | ICD-10-CM | POA: Diagnosis not present

## 2017-11-01 DIAGNOSIS — I87331 Chronic venous hypertension (idiopathic) with ulcer and inflammation of right lower extremity: Secondary | ICD-10-CM | POA: Diagnosis not present

## 2017-11-01 DIAGNOSIS — E1036 Type 1 diabetes mellitus with diabetic cataract: Secondary | ICD-10-CM | POA: Diagnosis not present

## 2017-11-01 DIAGNOSIS — E10621 Type 1 diabetes mellitus with foot ulcer: Secondary | ICD-10-CM | POA: Diagnosis not present

## 2017-11-15 ENCOUNTER — Other Ambulatory Visit (HOSPITAL_COMMUNITY)
Admission: RE | Admit: 2017-11-15 | Discharge: 2017-11-15 | Disposition: A | Discharge status: Home / Self Care | Payer: Medicare Other | Source: Other Acute Inpatient Hospital | Attending: Internal Medicine | Admitting: Internal Medicine

## 2017-11-15 DIAGNOSIS — L89613 Pressure ulcer of right heel, stage 3: Secondary | ICD-10-CM | POA: Insufficient documentation

## 2017-11-15 DIAGNOSIS — L97412 Non-pressure chronic ulcer of right heel and midfoot with fat layer exposed: Secondary | ICD-10-CM | POA: Diagnosis not present

## 2017-11-15 DIAGNOSIS — E10621 Type 1 diabetes mellitus with foot ulcer: Secondary | ICD-10-CM | POA: Diagnosis not present

## 2017-11-22 ENCOUNTER — Encounter (HOSPITAL_BASED_OUTPATIENT_CLINIC_OR_DEPARTMENT_OTHER): Payer: Medicare Other | Attending: Internal Medicine

## 2017-11-22 DIAGNOSIS — I87331 Chronic venous hypertension (idiopathic) with ulcer and inflammation of right lower extremity: Secondary | ICD-10-CM | POA: Insufficient documentation

## 2017-11-22 DIAGNOSIS — L97412 Non-pressure chronic ulcer of right heel and midfoot with fat layer exposed: Secondary | ICD-10-CM | POA: Insufficient documentation

## 2017-11-22 DIAGNOSIS — B9561 Methicillin susceptible Staphylococcus aureus infection as the cause of diseases classified elsewhere: Secondary | ICD-10-CM | POA: Diagnosis not present

## 2017-11-22 DIAGNOSIS — E10621 Type 1 diabetes mellitus with foot ulcer: Secondary | ICD-10-CM | POA: Diagnosis not present

## 2017-11-22 DIAGNOSIS — E1051 Type 1 diabetes mellitus with diabetic peripheral angiopathy without gangrene: Secondary | ICD-10-CM | POA: Diagnosis not present

## 2017-11-22 DIAGNOSIS — E1042 Type 1 diabetes mellitus with diabetic polyneuropathy: Secondary | ICD-10-CM | POA: Insufficient documentation

## 2017-11-22 LAB — AEROBIC CULTURE  (SUPERFICIAL SPECIMEN): GRAM STAIN: NONE SEEN

## 2017-11-22 LAB — AEROBIC CULTURE W GRAM STAIN (SUPERFICIAL SPECIMEN)

## 2017-12-06 DIAGNOSIS — E109 Type 1 diabetes mellitus without complications: Secondary | ICD-10-CM | POA: Diagnosis not present

## 2017-12-06 DIAGNOSIS — I87331 Chronic venous hypertension (idiopathic) with ulcer and inflammation of right lower extremity: Secondary | ICD-10-CM | POA: Diagnosis not present

## 2017-12-06 DIAGNOSIS — R2681 Unsteadiness on feet: Secondary | ICD-10-CM | POA: Diagnosis not present

## 2017-12-06 DIAGNOSIS — S81801A Unspecified open wound, right lower leg, initial encounter: Secondary | ICD-10-CM | POA: Diagnosis not present

## 2017-12-06 DIAGNOSIS — I7389 Other specified peripheral vascular diseases: Secondary | ICD-10-CM | POA: Diagnosis not present

## 2017-12-15 ENCOUNTER — Other Ambulatory Visit: Payer: Self-pay | Admitting: Podiatry

## 2017-12-15 ENCOUNTER — Encounter: Payer: Self-pay | Admitting: Podiatry

## 2017-12-15 ENCOUNTER — Ambulatory Visit: Payer: Medicare Other | Admitting: Podiatry

## 2017-12-15 ENCOUNTER — Ambulatory Visit (INDEPENDENT_AMBULATORY_CARE_PROVIDER_SITE_OTHER): Payer: Medicare Other

## 2017-12-15 VITALS — BP 166/71 | HR 71 | Ht 67.0 in | Wt 251.0 lb

## 2017-12-15 DIAGNOSIS — E0843 Diabetes mellitus due to underlying condition with diabetic autonomic (poly)neuropathy: Secondary | ICD-10-CM | POA: Diagnosis not present

## 2017-12-15 DIAGNOSIS — M79672 Pain in left foot: Secondary | ICD-10-CM | POA: Diagnosis not present

## 2017-12-15 DIAGNOSIS — L97512 Non-pressure chronic ulcer of other part of right foot with fat layer exposed: Secondary | ICD-10-CM

## 2017-12-15 DIAGNOSIS — N898 Other specified noninflammatory disorders of vagina: Secondary | ICD-10-CM | POA: Insufficient documentation

## 2017-12-15 DIAGNOSIS — R52 Pain, unspecified: Secondary | ICD-10-CM

## 2017-12-15 DIAGNOSIS — I70235 Atherosclerosis of native arteries of right leg with ulceration of other part of foot: Secondary | ICD-10-CM | POA: Diagnosis not present

## 2017-12-15 DIAGNOSIS — L97412 Non-pressure chronic ulcer of right heel and midfoot with fat layer exposed: Secondary | ICD-10-CM

## 2017-12-15 MED ORDER — DICLOXACILLIN SODIUM 500 MG PO CAPS: 500.0000 mg | ORAL_CAPSULE | Freq: Two times a day (BID) | ORAL | 0 refills | Status: DC

## 2017-12-15 NOTE — Progress Notes (Signed)
   Subjective:    Patient ID: [name], female    DOB: 04-22-1951, 67 y.o.   MRN: [id number]  HPI  Chief Complaint  Patient presents with  . Diabetes    type 1, diagnosed at age 89  . Foot Ulcer    Right heel - has been treated since September 2018 at the wound center, but is now ready to be released  . Difficulty Walking    discuss orthotics/diabetic shoes       Review of Systems  All other systems reviewed and are negative.      Objective:   Physical Exam        Assessment & Plan:
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
## 2017-12-20 ENCOUNTER — Encounter (HOSPITAL_BASED_OUTPATIENT_CLINIC_OR_DEPARTMENT_OTHER): Payer: Medicare Other

## 2017-12-21 ENCOUNTER — Ambulatory Visit: Payer: Medicare Other | Admitting: Vascular Surgery

## 2017-12-21 ENCOUNTER — Other Ambulatory Visit: Payer: Self-pay

## 2017-12-21 ENCOUNTER — Encounter: Payer: Self-pay | Admitting: Vascular Surgery

## 2017-12-21 VITALS — BP 131/68 | HR 66 | Temp 98.0°F | Resp 14 | Ht 67.0 in | Wt 251.0 lb

## 2017-12-21 DIAGNOSIS — I83893 Varicose veins of bilateral lower extremities with other complications: Secondary | ICD-10-CM | POA: Diagnosis not present
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
## 2017-12-27 ENCOUNTER — Ambulatory Visit: Payer: Medicare Other | Admitting: Orthotics

## 2017-12-27 ENCOUNTER — Encounter: Payer: Self-pay | Admitting: Podiatry

## 2017-12-27 ENCOUNTER — Ambulatory Visit (INDEPENDENT_AMBULATORY_CARE_PROVIDER_SITE_OTHER): Payer: Medicare Other | Admitting: Podiatry

## 2017-12-27 DIAGNOSIS — L97412 Non-pressure chronic ulcer of right heel and midfoot with fat layer exposed: Secondary | ICD-10-CM | POA: Diagnosis not present

## 2017-12-27 DIAGNOSIS — E0843 Diabetes mellitus due to underlying condition with diabetic autonomic (poly)neuropathy: Secondary | ICD-10-CM

## 2017-12-27 DIAGNOSIS — I70235 Atherosclerosis of native arteries of right leg with ulceration of other part of foot: Secondary | ICD-10-CM

## 2017-12-27 MED ORDER — GENTAMICIN SULFATE 0.1 % EX CREA: 1.0000 "application " | TOPICAL_CREAM | Freq: Three times a day (TID) | CUTANEOUS | 1 refills | Status: DC

## 2017-12-27 NOTE — Progress Notes (Signed)
Patient came in today for fitting and eval for diabetic shoes: Patient' doctor here is Evans   Patient presents with DM2, ulcer heel   Patient was measured with brannock 9.5 W.  W/ brannock device and cast in foam for custom inserts.  Patient chose NB 990
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
## 2017-12-31 ENCOUNTER — Other Ambulatory Visit: Payer: Self-pay | Admitting: *Deleted

## 2017-12-31 DIAGNOSIS — I83893 Varicose veins of bilateral lower extremities with other complications: Secondary | ICD-10-CM

## 2018-01-06 DIAGNOSIS — Z794 Long term (current) use of insulin: Secondary | ICD-10-CM | POA: Diagnosis not present

## 2018-01-06 DIAGNOSIS — E109 Type 1 diabetes mellitus without complications: Secondary | ICD-10-CM | POA: Diagnosis not present

## 2018-01-06 DIAGNOSIS — I10 Essential (primary) hypertension: Secondary | ICD-10-CM | POA: Diagnosis not present

## 2018-01-06 DIAGNOSIS — Z6841 Body Mass Index (BMI) 40.0 and over, adult: Secondary | ICD-10-CM | POA: Diagnosis not present

## 2018-01-10 ENCOUNTER — Encounter: Payer: Self-pay | Admitting: Vascular Surgery

## 2018-01-10 ENCOUNTER — Ambulatory Visit (INDEPENDENT_AMBULATORY_CARE_PROVIDER_SITE_OTHER): Payer: Medicare Other | Admitting: Vascular Surgery

## 2018-01-10 VITALS — BP 146/58 | HR 63 | Temp 97.0°F | Resp 18 | Ht 67.0 in | Wt 251.0 lb

## 2018-01-10 DIAGNOSIS — I83892 Varicose veins of left lower extremities with other complications: Secondary | ICD-10-CM | POA: Diagnosis not present

## 2018-01-10 HISTORY — PX: ENDOVENOUS ABLATION SAPHENOUS VEIN W/ LASER: SUR449

## 2018-01-10 NOTE — Progress Notes (Signed)
Subjective:     Patient ID: Brandi LeveringMarlee Clements, female   DOB: February 17, 1951, 67 y.o.   MRN: [id number]  HPI this 67 year old female had laser ablation of the proximal left great saphenous vein from the mid to proximal third of the thigh to near the saphenofemoral junction performed under local tumescent anesthesia. A total of 797 J of energy was. She tolerated the procedure well. The vein became quite tortuous distal to this point. Review of Systems     Objective:   Physical Exam BP (!) 146/58 (BP Location: Left Arm, Patient Position: Sitting, Cuff Size: Large)   Pulse 63   Temp (!) 97 F (36.1 C) (Oral)   Resp 18   Ht 5\' 7"  (1.702 m)   Wt 251 lb (113.9 kg)   SpO2 99%   BMI 39.31 kg/m        Assessment:     Well-tolerated laser ablation proximal left great saphenous vein performed under local tumescent anesthesia    Plan:     Return in 1 week for venous duplex exam to confirm closure left great saphenous vein Patient will then return in 3 months to be evaluated for possible stab phlebectomy of residual painful varicosities left thigh

## 2018-01-10 NOTE — Progress Notes (Signed)
Laser Ablation Procedure    Date: 01/10/2018   Brandi LeveringMarlee Clements DOB:1951-10-30  Consent signed: Yes    Surgeon:  Dr. Quita SkyeJames D. Hart RochesterLawson  Procedure: Laser Ablation: left Greater Saphenous Vein  BP (!) 146/58 (BP Location: Left Arm, Patient Position: Sitting, Cuff Size: Large)   Pulse 63   Temp (!) 97 F (36.1 C) (Oral)   Resp 18   Ht 5\' 7"  (1.702 m)   Wt 251 lb (113.9 kg)   SpO2 99%   BMI 39.31 kg/m   Tumescent Anesthesia: 300 cc 0.9% NaCl with 50 cc Lidocaine HCL 1% and 15 cc 8.4% NaHCO3  Local Anesthesia: 1 cc Lidocaine HCL and NaHCO3 (ratio 2:1)  Pulsed Mode: 17 watts, 500ms delay, 1.0 duration  Total Energy: 797 Joules             Total Pulses: 47               Total Time: 0:47    Patient tolerated procedure well    Description of Procedure:  After marking the course of the secondary varicosities, the patient was placed on the operating table in the supine position, and the left leg was prepped and draped in sterile fashion.   Local anesthetic was administered and under ultrasound guidance the saphenous vein was accessed with a micro needle and guide wire; then the mirco puncture sheath was placed.  A guide wire was inserted saphenofemoral junction , followed by a 5 french sheath.  The position of the sheath and then the laser fiber below the junction was confirmed using the ultrasound.  Tumescent anesthesia was administered along the course of the saphenous vein using ultrasound guidance. The patient was placed in Trendelenburg position and protective laser glasses were placed on patient and staff, and the laser was fired at 15 watts continuous mode advancing 1-592mm/second for a total of 797 joules.       Steri strip was applied to the IV insertion siteand ABD pads and thigh high compression stockings were applied.  Ace wrap bandages were applied over the left thigh and at the top of the saphenofemoral junction. Blood loss was less than 15 cc.  The patient ambulated out of the  operating room having tolerated the procedure well.

## 2018-01-17 ENCOUNTER — Ambulatory Visit (INDEPENDENT_AMBULATORY_CARE_PROVIDER_SITE_OTHER): Payer: Medicare Other | Admitting: Podiatry

## 2018-01-17 ENCOUNTER — Encounter: Payer: Self-pay | Admitting: Podiatry

## 2018-01-17 DIAGNOSIS — E0843 Diabetes mellitus due to underlying condition with diabetic autonomic (poly)neuropathy: Secondary | ICD-10-CM

## 2018-01-17 DIAGNOSIS — L97412 Non-pressure chronic ulcer of right heel and midfoot with fat layer exposed: Secondary | ICD-10-CM

## 2018-01-17 DIAGNOSIS — I70235 Atherosclerosis of native arteries of right leg with ulceration of other part of foot: Secondary | ICD-10-CM

## 2018-01-18 ENCOUNTER — Encounter: Payer: Self-pay | Admitting: Vascular Surgery

## 2018-01-18 ENCOUNTER — Ambulatory Visit (INDEPENDENT_AMBULATORY_CARE_PROVIDER_SITE_OTHER): Payer: Medicare Other | Admitting: Vascular Surgery

## 2018-01-18 ENCOUNTER — Other Ambulatory Visit: Payer: Self-pay

## 2018-01-18 ENCOUNTER — Ambulatory Visit (HOSPITAL_COMMUNITY)
Admission: RE | Admit: 2018-01-18 | Discharge: 2018-01-18 | Disposition: A | Discharge status: Home / Self Care | Payer: Medicare Other | Source: Ambulatory Visit | Attending: Vascular Surgery | Admitting: Vascular Surgery

## 2018-01-18 VITALS — BP 181/73 | HR 62 | Temp 97.4°F | Resp 16 | Ht 67.0 in | Wt 251.0 lb

## 2018-01-18 DIAGNOSIS — I83893 Varicose veins of bilateral lower extremities with other complications: Secondary | ICD-10-CM | POA: Diagnosis not present

## 2018-01-18 DIAGNOSIS — I83892 Varicose veins of left lower extremities with other complications: Secondary | ICD-10-CM

## 2018-01-18 DIAGNOSIS — E669 Obesity, unspecified: Secondary | ICD-10-CM | POA: Diagnosis not present

## 2018-01-18 DIAGNOSIS — Z9889 Other specified postprocedural states: Secondary | ICD-10-CM | POA: Insufficient documentation
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
## 2018-01-31 ENCOUNTER — Telehealth: Payer: Self-pay | Admitting: *Deleted

## 2018-01-31 NOTE — Telephone Encounter (Signed)
Returning [name]'s earlier telephone voice message regarding 3 recent episodes of bleeding from varicosity on the  lateral aspect of her left foot.  Mrs. Delanna AhmadiBaum is s/p endovenous laser ablation L GSV by Josephina GipJames Lawson MD on 01-10-2018.  Instructed Mrs. Mehaffey on how to stop bleeding from LE varicosity if it should reoccur.  VV follow up appointment made with Dr. Hart RochesterLawson tomorrow 02-01-2018 at 1:00PM.  Mrs. Delanna AhmadiBaum is aware of appointment date and time.

## 2018-02-01 ENCOUNTER — Ambulatory Visit: Payer: Medicare Other | Admitting: Orthotics

## 2018-02-01 ENCOUNTER — Ambulatory Visit (INDEPENDENT_AMBULATORY_CARE_PROVIDER_SITE_OTHER): Payer: Medicare Other | Admitting: Vascular Surgery

## 2018-02-01 ENCOUNTER — Encounter: Payer: Self-pay | Admitting: Vascular Surgery

## 2018-02-01 VITALS — BP 145/62 | HR 68 | Temp 97.0°F | Resp 18 | Ht 67.0 in | Wt 251.0 lb

## 2018-02-01 DIAGNOSIS — I83892 Varicose veins of left lower extremities with other complications: Secondary | ICD-10-CM
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
## 2018-02-03 ENCOUNTER — Ambulatory Visit (INDEPENDENT_AMBULATORY_CARE_PROVIDER_SITE_OTHER): Payer: Medicare Other | Admitting: Orthotics

## 2018-02-03 DIAGNOSIS — M1712 Unilateral primary osteoarthritis, left knee: Secondary | ICD-10-CM | POA: Diagnosis not present

## 2018-02-03 DIAGNOSIS — E0843 Diabetes mellitus due to underlying condition with diabetic autonomic (poly)neuropathy: Secondary | ICD-10-CM

## 2018-02-03 DIAGNOSIS — I83892 Varicose veins of left lower extremities with other complications: Secondary | ICD-10-CM

## 2018-02-03 DIAGNOSIS — L97412 Non-pressure chronic ulcer of right heel and midfoot with fat layer exposed: Secondary | ICD-10-CM | POA: Diagnosis not present

## 2018-02-03 NOTE — Progress Notes (Signed)

## 2018-02-07 ENCOUNTER — Telehealth: Payer: Self-pay | Admitting: *Deleted

## 2018-02-07 NOTE — Telephone Encounter (Signed)
Pt called in c/o pain in her left leg below her knee on the side. The pain occurs sometimes when she walks. She denies swelling of foot and ankle. He post laser study was normal. I suggested wearing her stockings on that leg when she is walking for a little longer and suggested Ibuprofen to reduce any lingering inflammation. She was reassured. Will follow prn.

## 2018-02-16 IMAGING — NM NM BONE 3 PHASE
2 series · 12 of 12 positions shown · non-contrast
Comparison: None.

CLINICAL DATA: Total knee replacement 1665.  Painful walking.

EXAM:
NUCLEAR MEDICINE 3-PHASE BONE SCAN
TECHNIQUE: Radionuclide angiographic images, immediate static blood pool
images, and 3-hour delayed static images were obtained of the knees
after intravenous injection of radiopharmaceutical.
RADIOPHARMACEUTICALS:  Twenty-five mCi Nc-RRm MDP

[Series 1: flow · 4.14mm/px · 6 of 40 frames shown (1 of 2)]
[frame 4/40  full-range]
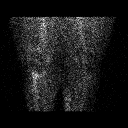
[frame 10/40  full-range]
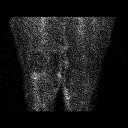
[frame 17/40  full-range]
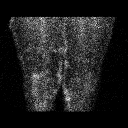
[frame 24/40  full-range]
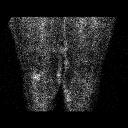
[frame 30/40  full-range]
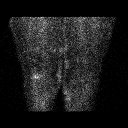
[frame 37/40  full-range]
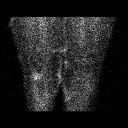

[Series 1: flow · 4.14mm/px · 6 of 40 frames shown (2 of 2)]
[frame 4/40  full-range]
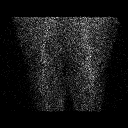
[frame 10/40  full-range]
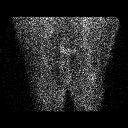
[frame 17/40  full-range]
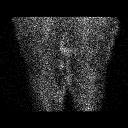
[frame 24/40  full-range]
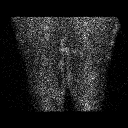
[frame 30/40  full-range]
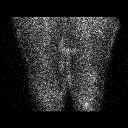
[frame 37/40  full-range]
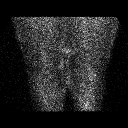

[12 of 12 positions shown; findings below may reference images not displayed]

FINDINGS: Vascular phase: Mild increased blood flow to the lateral RIGHT knee,
below the knee joint. Venous varicosities noted within the lower
extremities.

Blood pool phase: Focal activity in the RIGHT proximal RIGHT tibia

Delayed phase: Intense activity at the tip of the RIGHT tibia
prosthetic stem.
IMPRESSION: A:
IMPRESSION: A
Evidence for loosening of the RIGHT tibial prosthetic at the
inferior tip of the stem.

## 2018-04-19 ENCOUNTER — Encounter: Payer: Self-pay | Admitting: Vascular Surgery

## 2018-04-19 ENCOUNTER — Ambulatory Visit: Payer: Medicare Other | Admitting: Vascular Surgery

## 2018-04-19 VITALS — BP 123/66 | HR 65 | Temp 97.1°F | Resp 18 | Ht 67.0 in | Wt 251.0 lb

## 2018-04-19 DIAGNOSIS — I83892 Varicose veins of left lower extremities with other complications: Secondary | ICD-10-CM
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
## 2018-05-03 DIAGNOSIS — E109 Type 1 diabetes mellitus without complications: Secondary | ICD-10-CM | POA: Diagnosis not present

## 2018-05-16 ENCOUNTER — Ambulatory Visit (INDEPENDENT_AMBULATORY_CARE_PROVIDER_SITE_OTHER): Payer: Medicare Other | Admitting: Orthopaedic Surgery

## 2018-05-16 ENCOUNTER — Ambulatory Visit (INDEPENDENT_AMBULATORY_CARE_PROVIDER_SITE_OTHER): Payer: Self-pay

## 2018-05-16 ENCOUNTER — Encounter (INDEPENDENT_AMBULATORY_CARE_PROVIDER_SITE_OTHER): Payer: Self-pay | Admitting: Orthopaedic Surgery

## 2018-05-16 DIAGNOSIS — M25562 Pain in left knee: Secondary | ICD-10-CM

## 2018-05-16 DIAGNOSIS — M25512 Pain in left shoulder: Secondary | ICD-10-CM | POA: Diagnosis not present

## 2018-05-16 MED ORDER — METHYLPREDNISOLONE ACETATE 40 MG/ML IJ SUSP
40.0000 mg | INTRAMUSCULAR | Status: AC | PRN
  Administered 2018-05-16: 40 mg via INTRA_ARTICULAR

## 2018-05-16 MED ORDER — LIDOCAINE HCL 1 % IJ SOLN
3.0000 mL | INTRAMUSCULAR | Status: AC | PRN
  Administered 2018-05-16: 3 mL
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
## 2018-05-27 DIAGNOSIS — H526 Other disorders of refraction: Secondary | ICD-10-CM | POA: Diagnosis not present

## 2018-05-30 ENCOUNTER — Encounter (INDEPENDENT_AMBULATORY_CARE_PROVIDER_SITE_OTHER): Payer: Self-pay | Admitting: Orthopaedic Surgery

## 2018-05-30 ENCOUNTER — Ambulatory Visit (INDEPENDENT_AMBULATORY_CARE_PROVIDER_SITE_OTHER): Payer: Medicare Other | Admitting: Orthopaedic Surgery

## 2018-05-30 ENCOUNTER — Telehealth (INDEPENDENT_AMBULATORY_CARE_PROVIDER_SITE_OTHER): Payer: Self-pay

## 2018-05-30 VITALS — Ht 67.0 in | Wt 251.0 lb

## 2018-05-30 DIAGNOSIS — M25512 Pain in left shoulder: Secondary | ICD-10-CM | POA: Diagnosis not present

## 2018-05-30 DIAGNOSIS — M1712 Unilateral primary osteoarthritis, left knee: Secondary | ICD-10-CM | POA: Diagnosis not present

## 2018-05-30 DIAGNOSIS — M25562 Pain in left knee: Secondary | ICD-10-CM | POA: Diagnosis not present

## 2018-05-30 NOTE — Progress Notes (Signed)
The patient is well-known to me.  She does have osteoarthritis and pain in the left knee.  Is also been dealing with left shoulder pain.  She is a diabetic.  I placed an injection of a steroid in her left knee 2 weeks ago.  It did bump up her blood glucose but that has come back down now.  Held off on her shoulder injection due to her being a diabetic.  We will bring her back today to see if she would like to have a shoulder injection.  However her left shoulder is doing much better overall and she does not need that.  On exam the left shoulder is moving better with minimal pain.  Her left knee still hurts her quite a bit.  I do feel she is a candidate for hyaluronic acid trial left knee.  I talked her about that type of injection.  She is interested in this so we will order a hyaluronic acid injection to treat the pain from osteoarthritis in her left knee.  We will see her back once that injection is approved and comes in.

## 2018-05-30 NOTE — Telephone Encounter (Signed)
Noted  

## 2018-05-30 NOTE — Telephone Encounter (Signed)
Left knee pain  hyaluronic injection  CB pt.

## 2018-05-31 ENCOUNTER — Telehealth (INDEPENDENT_AMBULATORY_CARE_PROVIDER_SITE_OTHER): Payer: Self-pay

## 2018-05-31 NOTE — Telephone Encounter (Signed)
Submitted application online for SynviscOne, left knee. 

## 2018-06-29 ENCOUNTER — Telehealth (INDEPENDENT_AMBULATORY_CARE_PROVIDER_SITE_OTHER): Payer: Self-pay

## 2018-06-29 NOTE — Telephone Encounter (Signed)
Patient approved for SynviscOne, left knee. Buy & Bill Patient will be responsible for 20% OOP. No PA required. Appt. Scheduled 06/30/2018

## 2018-06-30 ENCOUNTER — Ambulatory Visit (INDEPENDENT_AMBULATORY_CARE_PROVIDER_SITE_OTHER): Payer: Medicare Other | Admitting: Orthopaedic Surgery

## 2018-06-30 ENCOUNTER — Encounter (INDEPENDENT_AMBULATORY_CARE_PROVIDER_SITE_OTHER): Payer: Self-pay | Admitting: Orthopaedic Surgery

## 2018-06-30 DIAGNOSIS — M1712 Unilateral primary osteoarthritis, left knee: Secondary | ICD-10-CM | POA: Diagnosis not present

## 2018-06-30 MED ORDER — HYLAN G-F 20 48 MG/6ML IX SOSY
48.0000 mg | PREFILLED_SYRINGE | INTRA_ARTICULAR | Status: AC | PRN
  Administered 2018-06-30: 48 mg via INTRA_ARTICULAR

## 2018-06-30 NOTE — Progress Notes (Signed)
   Procedure Note  Patient: Brandi LeveringMarlee Clements             Date of Birth: Nov 30, 1950           MRN: [id number]             Visit Date: 06/30/2018  Procedures: Visit Diagnoses: Unilateral primary osteoarthritis, left knee  Large Joint Inj: L knee on 06/30/2018 12:57 PM Indications: pain and diagnostic evaluation Details: 22 G 1.5 in needle, superolateral approach  Arthrogram: No  Medications: 48 mg Hylan 48 MG/6ML Outcome: tolerated well, no immediate complications Procedure, treatment alternatives, risks and benefits explained, specific risks discussed. Consent was given by the patient. Immediately prior to procedure a time out was called to verify the correct patient, procedure, equipment, support staff and site/side marked as required. Patient was prepped and draped in the usual sterile fashion.     The patient is coming today for scheduled hyaluronic acid injection with Synvisc 1 her left knee to treat the pain from osteoarthritis of that knee.  She is tried and failed other forms of conservative treatment measures.  The knee is doing well overall other than just pain in the knee.  On exam there is no significant deformities in terms of gross deformity but there is patellofemoral crepitation and joint line tenderness with known osteoarthritis on x-rays as well.  She tolerated the Synvisc 1 injection well.  She would like to come back and see as though in 2 weeks to reevaluate her shoulder which had previous shoulder surgery remotely with plate and screws.  We will get 3 views of that shoulder on her next visit.

## 2018-07-11 DIAGNOSIS — Z1231 Encounter for screening mammogram for malignant neoplasm of breast: Secondary | ICD-10-CM | POA: Diagnosis not present

## 2018-07-14 ENCOUNTER — Encounter (INDEPENDENT_AMBULATORY_CARE_PROVIDER_SITE_OTHER): Payer: Self-pay | Admitting: Orthopaedic Surgery

## 2018-07-14 ENCOUNTER — Ambulatory Visit (INDEPENDENT_AMBULATORY_CARE_PROVIDER_SITE_OTHER): Payer: Medicare Other | Admitting: Orthopaedic Surgery

## 2018-07-14 ENCOUNTER — Ambulatory Visit (INDEPENDENT_AMBULATORY_CARE_PROVIDER_SITE_OTHER): Payer: Medicare Other

## 2018-07-14 DIAGNOSIS — G8929 Other chronic pain: Secondary | ICD-10-CM

## 2018-07-14 DIAGNOSIS — M19019 Primary osteoarthritis, unspecified shoulder: Secondary | ICD-10-CM

## 2018-07-14 DIAGNOSIS — M25511 Pain in right shoulder: Secondary | ICD-10-CM

## 2018-07-14 DIAGNOSIS — M1712 Unilateral primary osteoarthritis, left knee: Secondary | ICD-10-CM

## 2018-07-14 NOTE — Progress Notes (Signed)
The patient is well-known to me.  She is 2 weeks status post a left knee Synvisc 1 injection due to severe arthritis in that knee.  We placed a steroid injection in that knee in early July.  Really nothing is helpful her in the knee is hurting worse.  On examination of the knee there is no significant effusion and there is significant pain to be expected giving the arthritis of her knee.  I gave her reassurance that this can be normal and just to try to give it time because the other option would be knee replacement surgery.  Her right shoulder is been hurting for some time now she had open reduction internal fixation of a severely displaced proximal humerus fracture years ago.  X-rays of that today show the fracture healed completely and the hardware is intact with no evidence of hardware failure.  There is significant posttraumatic glenohumeral arthritis.  On exam she has significant deficit of the rotator cuff with weakness and limited abduction.  As far as her shoulder goes certainly we can set up an intra-articular steroid injection under fluoroscopy in the right shoulder glenohumeral joint by Dr. Alvester MorinNewton.  She will think about this and call us if she like to have injection.  Otherwise I do not need to see her back for 2 months.  No x-rays are needed at that visit.

## 2018-07-15 ENCOUNTER — Other Ambulatory Visit (INDEPENDENT_AMBULATORY_CARE_PROVIDER_SITE_OTHER): Payer: Self-pay

## 2018-07-15 DIAGNOSIS — M25511 Pain in right shoulder: Secondary | ICD-10-CM

## 2018-08-17 ENCOUNTER — Ambulatory Visit (INDEPENDENT_AMBULATORY_CARE_PROVIDER_SITE_OTHER): Payer: Medicare Other | Admitting: Physical Medicine and Rehabilitation

## 2018-08-17 ENCOUNTER — Encounter (INDEPENDENT_AMBULATORY_CARE_PROVIDER_SITE_OTHER): Payer: Self-pay | Admitting: Physical Medicine and Rehabilitation

## 2018-08-17 ENCOUNTER — Ambulatory Visit (INDEPENDENT_AMBULATORY_CARE_PROVIDER_SITE_OTHER): Payer: Self-pay

## 2018-08-17 DIAGNOSIS — G8929 Other chronic pain: Secondary | ICD-10-CM | POA: Diagnosis not present

## 2018-08-17 DIAGNOSIS — M25511 Pain in right shoulder: Secondary | ICD-10-CM | POA: Diagnosis not present

## 2018-08-17 NOTE — Patient Instructions (Signed)

## 2018-08-17 NOTE — Progress Notes (Signed)
 .  Numeric Pain Rating Scale and Functional Assessment Average Pain 8   In the last MONTH (on 0-10 scale) has pain interfered with the following?  1. General activity like being  able to carry out your everyday physical activities such as walking, climbing stairs, carrying groceries, or moving a chair?  Rating(5)    -Dye Allergies.  

## 2018-08-17 NOTE — Progress Notes (Signed)
   [name] - 67 y.o. female MRN [id number]  Date of birth: 1951-07-07  Office Visit Note: Visit Date: 08/17/2018 PCP: Jarome Matin, MD Referred by: Jarome Matin, MD  Subjective: Chief Complaint  Patient presents with  . Right Shoulder - Pain  . Right Arm - Pain   HPI: For planned right glenohumeral joint injection under fluoroscopy with history of posttraumatic severe osteoarthritis of the right glenohumeral joint.  Prior fracture with fixation.  Patient has decreased range of motion and increased pain.   ROS Otherwise per HPI.  Assessment & Plan: Visit Diagnoses:  1. Chronic right shoulder pain     Plan: Findings:  During the anesthetic phase she did have some decreased pain but still had fairly limited range of motion.    Meds & Orders: No orders of the defined types were placed in this encounter.   Orders Placed This Encounter  Procedures  . Large Joint Inj: R glenohumeral  . XR C-ARM NO REPORT    Follow-up: Return if symptoms worsen or fail to improve, for Dr. Magnus Ivan.   Procedures: Large Joint Inj: R glenohumeral on 08/17/2018 10:28 AM Indications: pain and diagnostic evaluation Details: 22 G 3.5 in needle, fluoroscopy-guided anteromedial approach  Arthrogram: No  Medications: 80 mg triamcinolone acetonide 40 MG/ML; 3 mL bupivacaine 0.5 % Outcome: tolerated well, no immediate complications  There was excellent flow of contrast producing a partial arthrogram of the glenohumeral joint. The patient did have relief of symptoms during the anesthetic phase of the injection. Procedure, treatment alternatives, risks and benefits explained, specific risks discussed. Consent was given by the patient. Immediately prior to procedure a time out was called to verify the correct patient, procedure, equipment, support staff and site/side marked as required. Patient was prepped and draped in the usual sterile fashion.      No notes on file   Clinical History: No  specialty comments available.     Objective:  VS:  HT:    WT:   BMI:     BP:   HR: bpm  TEMP: ( )  RESP:  Physical Exam  Ortho Exam Imaging: No results found.

## 2018-08-25 MED ORDER — BUPIVACAINE HCL 0.5 % IJ SOLN
3.0000 mL | INTRAMUSCULAR | Status: AC | PRN
  Administered 2018-08-17: 3 mL via INTRA_ARTICULAR

## 2018-08-25 MED ORDER — TRIAMCINOLONE ACETONIDE 40 MG/ML IJ SUSP
80.0000 mg | INTRAMUSCULAR | Status: AC | PRN
  Administered 2018-08-17: 80 mg via INTRA_ARTICULAR

## 2018-09-13 ENCOUNTER — Ambulatory Visit (INDEPENDENT_AMBULATORY_CARE_PROVIDER_SITE_OTHER): Payer: Medicare Other | Admitting: Orthopaedic Surgery

## 2018-09-15 DIAGNOSIS — I10 Essential (primary) hypertension: Secondary | ICD-10-CM | POA: Diagnosis not present

## 2018-09-15 DIAGNOSIS — E109 Type 1 diabetes mellitus without complications: Secondary | ICD-10-CM | POA: Diagnosis not present

## 2018-09-15 DIAGNOSIS — E7849 Other hyperlipidemia: Secondary | ICD-10-CM | POA: Diagnosis not present

## 2018-09-15 DIAGNOSIS — M859 Disorder of bone density and structure, unspecified: Secondary | ICD-10-CM | POA: Diagnosis not present

## 2018-09-22 DIAGNOSIS — I10 Essential (primary) hypertension: Secondary | ICD-10-CM | POA: Diagnosis not present

## 2018-09-22 DIAGNOSIS — Z0000 Encounter for general adult medical examination without abnormal findings: Secondary | ICD-10-CM | POA: Diagnosis not present

## 2018-09-22 DIAGNOSIS — Z794 Long term (current) use of insulin: Secondary | ICD-10-CM | POA: Diagnosis not present

## 2018-09-22 DIAGNOSIS — R82998 Other abnormal findings in urine: Secondary | ICD-10-CM | POA: Diagnosis not present

## 2018-09-22 DIAGNOSIS — E109 Type 1 diabetes mellitus without complications: Secondary | ICD-10-CM | POA: Diagnosis not present

## 2018-09-23 DIAGNOSIS — E109 Type 1 diabetes mellitus without complications: Secondary | ICD-10-CM | POA: Diagnosis not present

## 2018-09-30 DIAGNOSIS — Z1212 Encounter for screening for malignant neoplasm of rectum: Secondary | ICD-10-CM | POA: Diagnosis not present

## 2018-11-14 ENCOUNTER — Other Ambulatory Visit: Payer: Self-pay | Admitting: Podiatry

## 2018-11-15 NOTE — Telephone Encounter (Signed)
I spoke with pt and she states she mistakenly ordered the gentamicin cream and didn't know how to fix it. I told her I would cancel the order.

## 2018-11-16 DIAGNOSIS — U071 COVID-19: Secondary | ICD-10-CM

## 2018-11-16 HISTORY — DX: COVID-19: U07.1

## 2018-11-17 DIAGNOSIS — E109 Type 1 diabetes mellitus without complications: Secondary | ICD-10-CM | POA: Diagnosis not present

## 2019-01-20 DIAGNOSIS — Z794 Long term (current) use of insulin: Secondary | ICD-10-CM | POA: Diagnosis not present

## 2019-01-20 DIAGNOSIS — I7389 Other specified peripheral vascular diseases: Secondary | ICD-10-CM | POA: Diagnosis not present

## 2019-01-20 DIAGNOSIS — E109 Type 1 diabetes mellitus without complications: Secondary | ICD-10-CM | POA: Diagnosis not present

## 2019-03-07 IMAGING — US US EXTREM LOW VENOUS BILAT
1 series · 12 of 24 positions shown · non-contrast
Comparison: None.

CLINICAL DATA: 66-year-old female with suspected bilateral lower
extremity venous insufficiency.

EXAM:
BILATERAL LOWER EXTREMITY VENOUS DUPLEX ULTRASOUND
TECHNIQUE: Gray-scale sonography with graded compression, as well as color
Doppler and duplex ultrasound, were performed to evaluate the deep
and superficial veins of both lower extremities. Spectral Doppler
was utilized to evaluate flow at rest and with distal augmentation
maneuvers. A complete superficial venous insufficiency exam was
performed in the reverse Trendelenburg position. I personally
supervised the technical portion of the exam.

[Series 1: us extrem low venous bilat · 0.14mm/px · 12 of 102 slices shown]
[im 5/102]
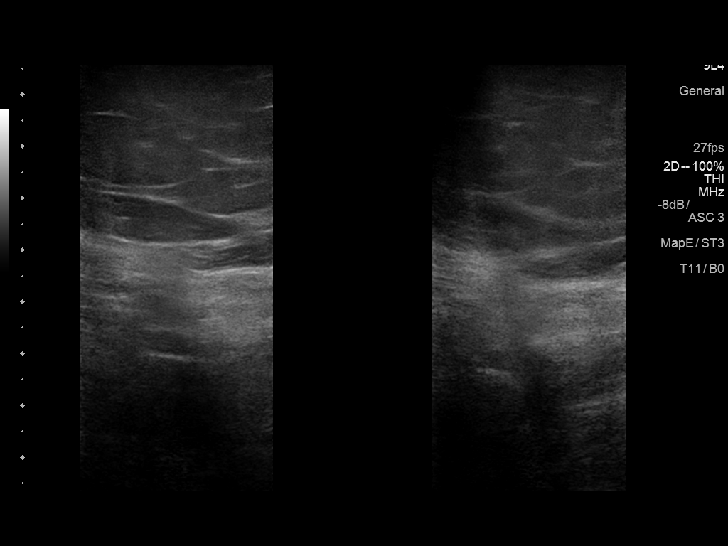
[im 14/102]
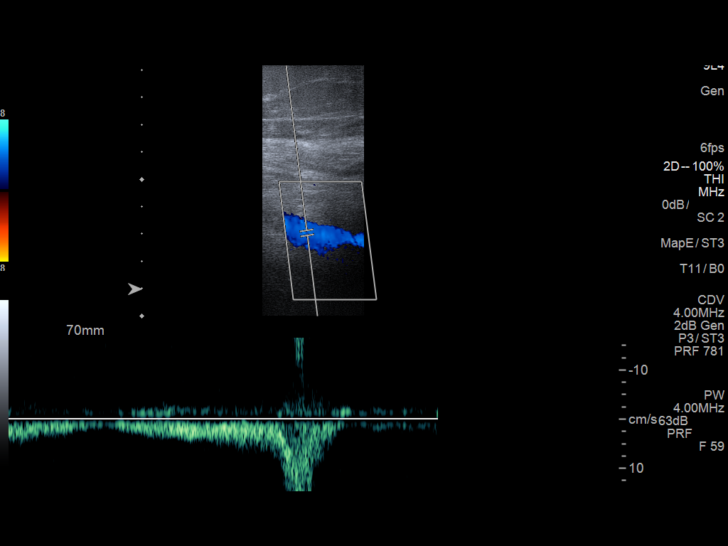
[im 22/102]
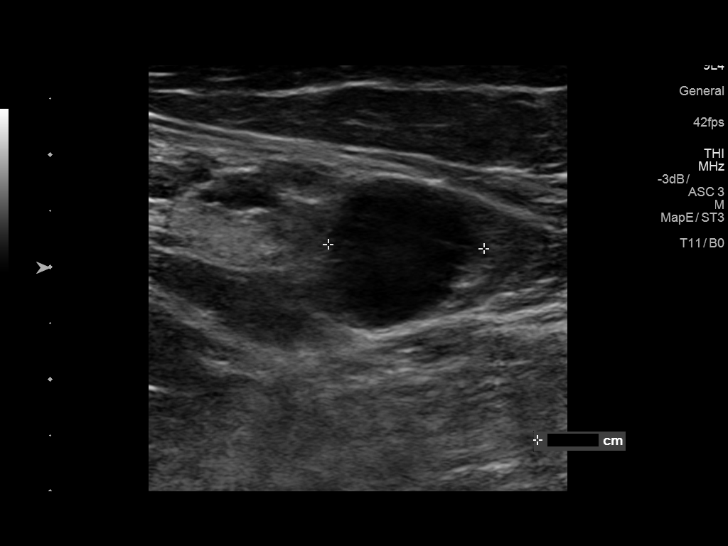
[im 31/102]
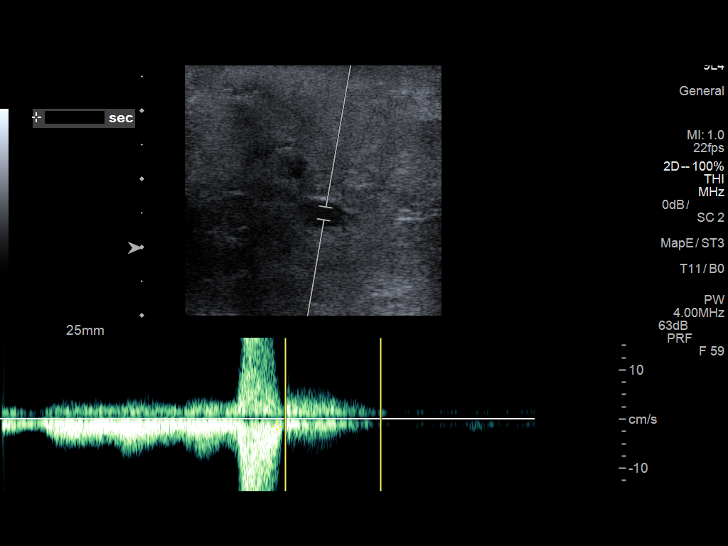
[im 40/102]
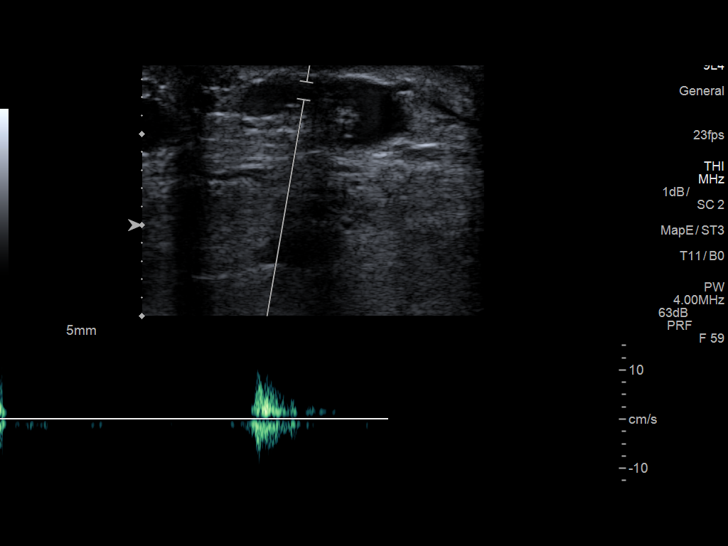
[im 49/102]
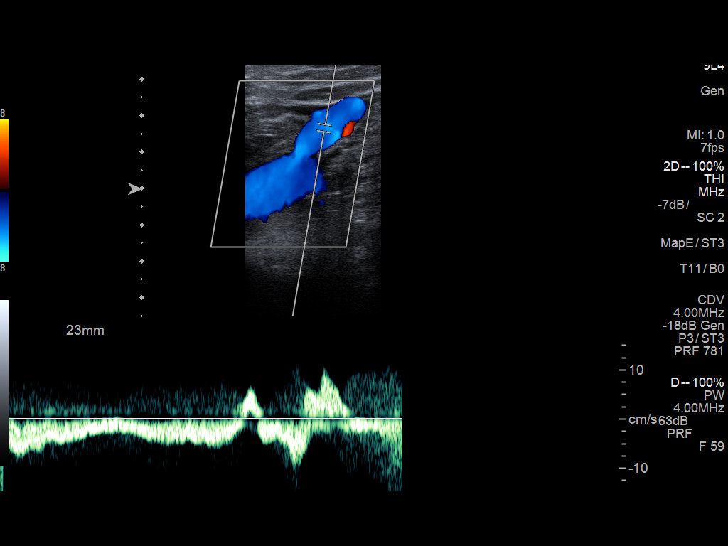
[im 58/102]
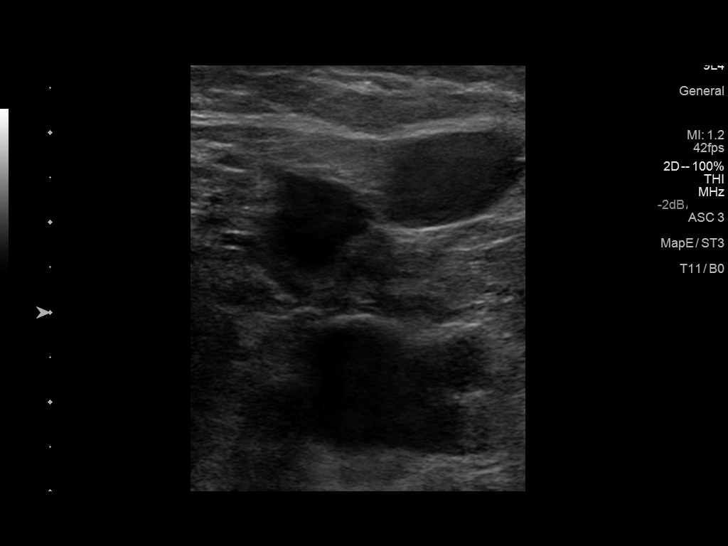
[im 66/102]
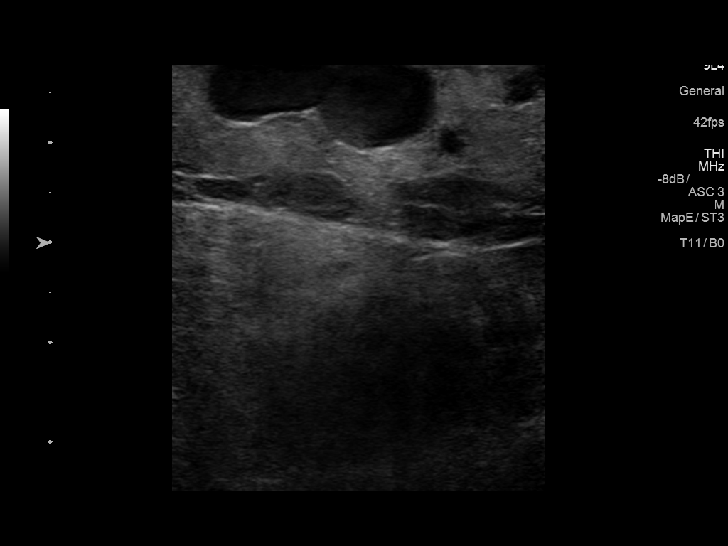
[im 75/102]
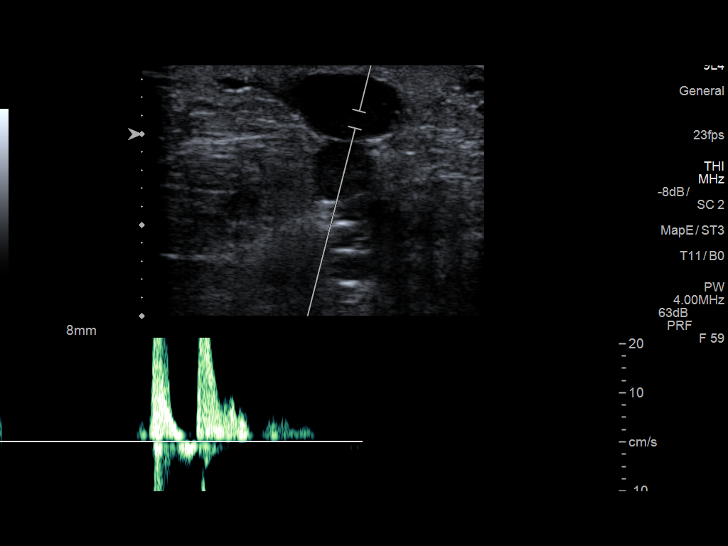
[im 84/102]
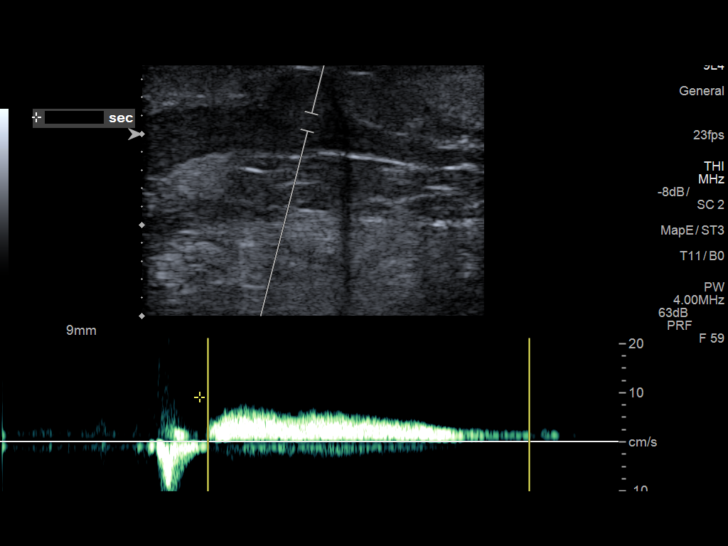
[im 93/102]
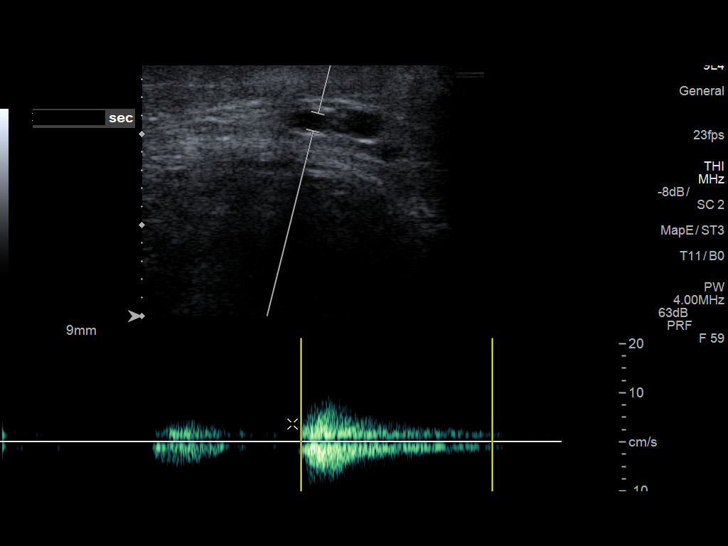
[im 102/102]
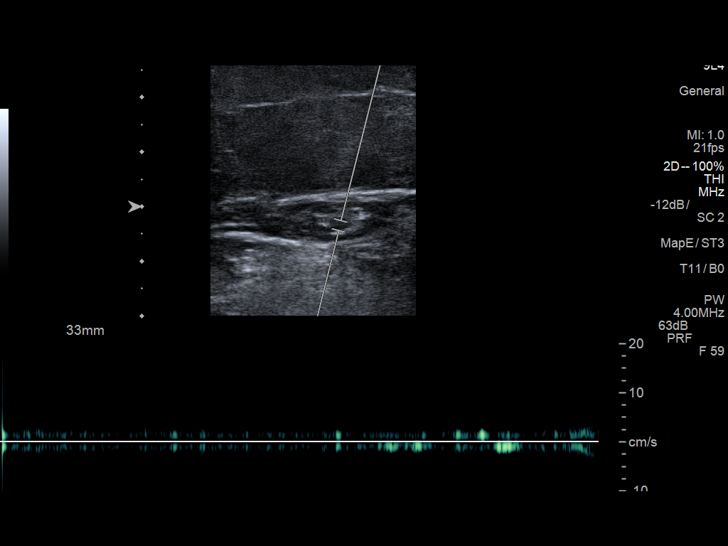

[12 of 24 positions shown; findings below may reference images not displayed]

FINDINGS: RIGHT Deep Venous System:

Evaluation of the deep venous system including the common femoral,
femoral, profunda femoral, popliteal and calf veins (where visible)
demonstrate no evidence of deep venous thrombosis. The vessels are
compressible and demonstrate normal respiratory phasicity and
response to augmentation. No evidence of deep venous reflux.

RIGHT Superficial Venous System:

SFJ: Patent.  Competent.

GSV Prox Thigh: Markedly dilated at 1.4 cm. Incompetent flow is
present lasting 1,100 milliseconds.

GSV Mid Thigh: Dilated at 1.5 cm. Incompetent flow documented at
2,300 milliseconds.

GSV Lower Thigh: Incompetent flow documented at 1,700 milliseconds.
The vein is dilated.

GSV Prox Calf: Incompetent flow documented at 1,100 milliseconds

GSV Mid Calf: Patent and competent.

GSV Distal Calf: Incompetent flow documented at 1,250 milliseconds

SPJ: Within normal limits

SSV Prox: Within normal limit

SSV Mid: Within normal limits

SSV Distal: Within normal limits

Other: Multiple dilated superficial venous varicosities.
Additionally, there are multiple incompetent perforator veins
throughout the calf.

LEFT Deep Venous System:

Evaluation of the deep venous system including the common femoral,
femoral, profunda femoral, popliteal and calf veins (where visible)
demonstrate no evidence of deep venous thrombosis. The vessels are
compressible and demonstrate normal respiratory phasicity and
response to augmentation. No evidence of deep venous reflux.

LEFT Superficial Venous System:

SFJ: Patent and unremarkable.

GSV Prox Thigh: Patent and unremarkable.

GSV Mid Thigh: Patent and unremarkable.

GSV Lower Thigh: Patent and unremarkable.

GSV Prox Calf: Patent and unremarkable.

GSV Mid Calf: Patent and unremarkable.

GSV Distal Calf: Patent and unremarkable.

SPJ: Patent and unremarkable.

SSV Prox: Patent and unremarkable.

SSV Mid: Patent and unremarkable.

SSV Distal: Patent and unremarkable.

Other: Large dilated anterior accessory great saphenous vein
beginning in the proximal thigh and extending down the length of the
left lower extremity and into the calf. Proximally, the anterior
accessory great saphenous vein measures 1.7 cm and demonstrates
significant incompetent flow lasting for more than 4,000
milliseconds. The vessel supplies multiple superficial dilated
venous varicosities which all demonstrate incompetent flow. The vein
extends into the distal calf for a drains into a perforator vein.
IMPRESSION: 1. Severe bilateral lower extremity venous insufficiency involving
the right great saphenous vein and a left anterior accessory great
saphenous vein.
2. Multiple superficial venous varicosities and incompetent
perforator veins bilaterally.
3. No evidence of deep or superficial venous thrombosis.

## 2019-04-25 ENCOUNTER — Encounter: Payer: Self-pay | Admitting: Family Medicine

## 2019-04-25 ENCOUNTER — Other Ambulatory Visit: Payer: Self-pay

## 2019-04-25 ENCOUNTER — Ambulatory Visit (INDEPENDENT_AMBULATORY_CARE_PROVIDER_SITE_OTHER): Payer: Medicare Other | Admitting: Family Medicine

## 2019-04-25 DIAGNOSIS — G8929 Other chronic pain: Secondary | ICD-10-CM

## 2019-04-25 DIAGNOSIS — M25562 Pain in left knee: Secondary | ICD-10-CM | POA: Diagnosis not present
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
## 2019-05-30 DIAGNOSIS — Z794 Long term (current) use of insulin: Secondary | ICD-10-CM | POA: Diagnosis not present

## 2019-05-30 DIAGNOSIS — E109 Type 1 diabetes mellitus without complications: Secondary | ICD-10-CM | POA: Diagnosis not present

## 2019-05-30 DIAGNOSIS — I10 Essential (primary) hypertension: Secondary | ICD-10-CM | POA: Diagnosis not present

## 2019-06-02 DIAGNOSIS — R509 Fever, unspecified: Secondary | ICD-10-CM | POA: Diagnosis not present

## 2019-06-02 DIAGNOSIS — Z20828 Contact with and (suspected) exposure to other viral communicable diseases: Secondary | ICD-10-CM | POA: Diagnosis not present

## 2019-06-02 DIAGNOSIS — E109 Type 1 diabetes mellitus without complications: Secondary | ICD-10-CM | POA: Diagnosis not present

## 2019-08-03 DIAGNOSIS — Z1231 Encounter for screening mammogram for malignant neoplasm of breast: Secondary | ICD-10-CM | POA: Diagnosis not present

## 2019-08-15 DIAGNOSIS — H524 Presbyopia: Secondary | ICD-10-CM | POA: Diagnosis not present

## 2019-08-29 DIAGNOSIS — I10 Essential (primary) hypertension: Secondary | ICD-10-CM | POA: Diagnosis not present

## 2019-08-29 DIAGNOSIS — L089 Local infection of the skin and subcutaneous tissue, unspecified: Secondary | ICD-10-CM | POA: Diagnosis not present

## 2019-08-29 DIAGNOSIS — I83009 Varicose veins of unspecified lower extremity with ulcer of unspecified site: Secondary | ICD-10-CM | POA: Diagnosis not present

## 2019-08-29 DIAGNOSIS — L0889 Other specified local infections of the skin and subcutaneous tissue: Secondary | ICD-10-CM | POA: Diagnosis not present

## 2019-09-03 DIAGNOSIS — Z791 Long term (current) use of non-steroidal anti-inflammatories (NSAID): Secondary | ICD-10-CM | POA: Diagnosis not present

## 2019-09-03 DIAGNOSIS — M21371 Foot drop, right foot: Secondary | ICD-10-CM | POA: Diagnosis not present

## 2019-09-03 DIAGNOSIS — I10 Essential (primary) hypertension: Secondary | ICD-10-CM | POA: Diagnosis not present

## 2019-09-03 DIAGNOSIS — L089 Local infection of the skin and subcutaneous tissue, unspecified: Secondary | ICD-10-CM | POA: Diagnosis not present

## 2019-09-03 DIAGNOSIS — E785 Hyperlipidemia, unspecified: Secondary | ICD-10-CM | POA: Diagnosis not present

## 2019-09-03 DIAGNOSIS — E1051 Type 1 diabetes mellitus with diabetic peripheral angiopathy without gangrene: Secondary | ICD-10-CM | POA: Diagnosis not present

## 2019-09-03 DIAGNOSIS — E1065 Type 1 diabetes mellitus with hyperglycemia: Secondary | ICD-10-CM | POA: Diagnosis not present

## 2019-09-03 DIAGNOSIS — K589 Irritable bowel syndrome without diarrhea: Secondary | ICD-10-CM | POA: Diagnosis not present

## 2019-09-03 DIAGNOSIS — M8580 Other specified disorders of bone density and structure, unspecified site: Secondary | ICD-10-CM | POA: Diagnosis not present

## 2019-09-03 DIAGNOSIS — H5461 Unqualified visual loss, right eye, normal vision left eye: Secondary | ICD-10-CM | POA: Diagnosis not present

## 2019-09-03 DIAGNOSIS — K219 Gastro-esophageal reflux disease without esophagitis: Secondary | ICD-10-CM | POA: Diagnosis not present

## 2019-09-03 DIAGNOSIS — Z4800 Encounter for change or removal of nonsurgical wound dressing: Secondary | ICD-10-CM | POA: Diagnosis not present

## 2019-09-03 DIAGNOSIS — L97821 Non-pressure chronic ulcer of other part of left lower leg limited to breakdown of skin: Secondary | ICD-10-CM | POA: Diagnosis not present

## 2019-09-03 DIAGNOSIS — I872 Venous insufficiency (chronic) (peripheral): Secondary | ICD-10-CM | POA: Diagnosis not present

## 2019-09-04 DIAGNOSIS — H401121 Primary open-angle glaucoma, left eye, mild stage: Secondary | ICD-10-CM | POA: Diagnosis not present

## 2019-09-04 DIAGNOSIS — E103392 Type 1 diabetes mellitus with moderate nonproliferative diabetic retinopathy without macular edema, left eye: Secondary | ICD-10-CM | POA: Diagnosis not present

## 2019-09-04 DIAGNOSIS — H18529 Epithelial (juvenile) corneal dystrophy, unspecified eye: Secondary | ICD-10-CM | POA: Diagnosis not present

## 2019-09-22 DIAGNOSIS — M859 Disorder of bone density and structure, unspecified: Secondary | ICD-10-CM | POA: Diagnosis not present

## 2019-09-22 DIAGNOSIS — E109 Type 1 diabetes mellitus without complications: Secondary | ICD-10-CM | POA: Diagnosis not present

## 2019-09-22 DIAGNOSIS — I10 Essential (primary) hypertension: Secondary | ICD-10-CM | POA: Diagnosis not present

## 2019-10-04 DIAGNOSIS — L97821 Non-pressure chronic ulcer of other part of left lower leg limited to breakdown of skin: Secondary | ICD-10-CM | POA: Diagnosis not present

## 2019-10-04 DIAGNOSIS — Z4800 Encounter for change or removal of nonsurgical wound dressing: Secondary | ICD-10-CM | POA: Diagnosis not present

## 2019-10-04 DIAGNOSIS — H5461 Unqualified visual loss, right eye, normal vision left eye: Secondary | ICD-10-CM | POA: Diagnosis not present

## 2019-10-04 DIAGNOSIS — I872 Venous insufficiency (chronic) (peripheral): Secondary | ICD-10-CM | POA: Diagnosis not present

## 2019-10-04 DIAGNOSIS — E1065 Type 1 diabetes mellitus with hyperglycemia: Secondary | ICD-10-CM | POA: Diagnosis not present

## 2019-10-04 DIAGNOSIS — K219 Gastro-esophageal reflux disease without esophagitis: Secondary | ICD-10-CM | POA: Diagnosis not present

## 2019-10-04 DIAGNOSIS — K589 Irritable bowel syndrome without diarrhea: Secondary | ICD-10-CM | POA: Diagnosis not present

## 2019-10-04 DIAGNOSIS — E1051 Type 1 diabetes mellitus with diabetic peripheral angiopathy without gangrene: Secondary | ICD-10-CM | POA: Diagnosis not present

## 2019-10-04 DIAGNOSIS — M8580 Other specified disorders of bone density and structure, unspecified site: Secondary | ICD-10-CM | POA: Diagnosis not present

## 2019-10-04 DIAGNOSIS — L089 Local infection of the skin and subcutaneous tissue, unspecified: Secondary | ICD-10-CM | POA: Diagnosis not present

## 2019-10-04 DIAGNOSIS — M21371 Foot drop, right foot: Secondary | ICD-10-CM | POA: Diagnosis not present

## 2019-10-04 DIAGNOSIS — E785 Hyperlipidemia, unspecified: Secondary | ICD-10-CM | POA: Diagnosis not present

## 2019-10-04 DIAGNOSIS — Z791 Long term (current) use of non-steroidal anti-inflammatories (NSAID): Secondary | ICD-10-CM | POA: Diagnosis not present

## 2019-10-04 DIAGNOSIS — I10 Essential (primary) hypertension: Secondary | ICD-10-CM | POA: Diagnosis not present

## 2019-11-02 DIAGNOSIS — L97821 Non-pressure chronic ulcer of other part of left lower leg limited to breakdown of skin: Secondary | ICD-10-CM | POA: Diagnosis not present

## 2019-11-02 DIAGNOSIS — L089 Local infection of the skin and subcutaneous tissue, unspecified: Secondary | ICD-10-CM | POA: Diagnosis not present

## 2019-11-02 DIAGNOSIS — E1051 Type 1 diabetes mellitus with diabetic peripheral angiopathy without gangrene: Secondary | ICD-10-CM | POA: Diagnosis not present

## 2019-11-02 DIAGNOSIS — I872 Venous insufficiency (chronic) (peripheral): Secondary | ICD-10-CM | POA: Diagnosis not present

## 2019-11-07 DIAGNOSIS — H5461 Unqualified visual loss, right eye, normal vision left eye: Secondary | ICD-10-CM | POA: Diagnosis not present

## 2019-11-07 DIAGNOSIS — L089 Local infection of the skin and subcutaneous tissue, unspecified: Secondary | ICD-10-CM | POA: Diagnosis not present

## 2019-11-07 DIAGNOSIS — K589 Irritable bowel syndrome without diarrhea: Secondary | ICD-10-CM | POA: Diagnosis not present

## 2019-11-07 DIAGNOSIS — M21371 Foot drop, right foot: Secondary | ICD-10-CM | POA: Diagnosis not present

## 2019-11-07 DIAGNOSIS — Z791 Long term (current) use of non-steroidal anti-inflammatories (NSAID): Secondary | ICD-10-CM | POA: Diagnosis not present

## 2019-11-07 DIAGNOSIS — Z4800 Encounter for change or removal of nonsurgical wound dressing: Secondary | ICD-10-CM | POA: Diagnosis not present

## 2019-11-07 DIAGNOSIS — K219 Gastro-esophageal reflux disease without esophagitis: Secondary | ICD-10-CM | POA: Diagnosis not present

## 2019-11-07 DIAGNOSIS — I872 Venous insufficiency (chronic) (peripheral): Secondary | ICD-10-CM | POA: Diagnosis not present

## 2019-11-07 DIAGNOSIS — E1065 Type 1 diabetes mellitus with hyperglycemia: Secondary | ICD-10-CM | POA: Diagnosis not present

## 2019-11-07 DIAGNOSIS — E785 Hyperlipidemia, unspecified: Secondary | ICD-10-CM | POA: Diagnosis not present

## 2019-11-07 DIAGNOSIS — M8580 Other specified disorders of bone density and structure, unspecified site: Secondary | ICD-10-CM | POA: Diagnosis not present

## 2019-11-07 DIAGNOSIS — L97821 Non-pressure chronic ulcer of other part of left lower leg limited to breakdown of skin: Secondary | ICD-10-CM | POA: Diagnosis not present

## 2019-11-07 DIAGNOSIS — Z9181 History of falling: Secondary | ICD-10-CM | POA: Diagnosis not present

## 2019-11-07 DIAGNOSIS — E1051 Type 1 diabetes mellitus with diabetic peripheral angiopathy without gangrene: Secondary | ICD-10-CM | POA: Diagnosis not present

## 2019-11-07 DIAGNOSIS — I10 Essential (primary) hypertension: Secondary | ICD-10-CM | POA: Diagnosis not present

## 2020-01-01 ENCOUNTER — Ambulatory Visit: Payer: Medicare Other | Attending: Internal Medicine

## 2020-01-01 ENCOUNTER — Other Ambulatory Visit: Payer: Self-pay

## 2020-01-01 DIAGNOSIS — Z23 Encounter for immunization: Secondary | ICD-10-CM

## 2020-01-01 NOTE — Progress Notes (Signed)
   Covid-19 Vaccination Clinic  Name:  Marveen Donlon    MRN: [id number] DOB: 08/06/1951  01/01/2020  Ms. Whitmyer was observed post Covid-19 immunization for 15 minutes without incidence. She was provided with Vaccine Information Sheet and instruction to access the V-Safe system.   Ms. Cadden was instructed to call 911 with any severe reactions post vaccine: Marland Kitchen Difficulty breathing  . Swelling of your face and throat  . A fast heartbeat  . A bad rash all over your body  . Dizziness and weakness    Immunizations Administered    Name Date Dose VIS Date Route   Moderna COVID-19 Vaccine 01/01/2020 12:43 PM 0.5 mL 10/17/2019 Intramuscular   Manufacturer: Moderna   Lot: [id number]   NDC: 83015-996-89

## 2020-01-30 ENCOUNTER — Ambulatory Visit: Payer: Medicare Other | Attending: Internal Medicine

## 2020-01-30 ENCOUNTER — Other Ambulatory Visit: Payer: Self-pay

## 2020-01-30 DIAGNOSIS — Z23 Encounter for immunization: Secondary | ICD-10-CM

## 2020-01-30 NOTE — Progress Notes (Signed)
   Covid-19 Vaccination Clinic  Name:  [name]    MRN: [id number] DOB: 1951-08-13  01/30/2020  [name] was observed post Covid-19 immunization for 15 minutes without incident. She was provided with Vaccine Information Sheet and instruction to access the V-Safe system.   [name] was instructed to call 911 with any severe reactions post vaccine: Marland Kitchen Difficulty breathing  . Swelling of face and throat  . A fast heartbeat  . A bad rash all over body  . Dizziness and weakness   Immunizations Administered    Name Date Dose VIS Date Route   Moderna COVID-19 Vaccine 01/30/2020  9:35 AM 0.5 mL 10/17/2019 Intramuscular   Manufacturer: Moderna   Lot: [id number]   NDC: 29574-734-03

## 2020-02-02 DIAGNOSIS — Z794 Long term (current) use of insulin: Secondary | ICD-10-CM | POA: Diagnosis not present

## 2020-02-02 DIAGNOSIS — I10 Essential (primary) hypertension: Secondary | ICD-10-CM | POA: Diagnosis not present

## 2020-02-02 DIAGNOSIS — E109 Type 1 diabetes mellitus without complications: Secondary | ICD-10-CM | POA: Diagnosis not present

## 2020-02-02 DIAGNOSIS — I739 Peripheral vascular disease, unspecified: Secondary | ICD-10-CM | POA: Diagnosis not present

## 2020-02-05 DIAGNOSIS — E109 Type 1 diabetes mellitus without complications: Secondary | ICD-10-CM | POA: Diagnosis not present

## 2020-03-04 DIAGNOSIS — H401121 Primary open-angle glaucoma, left eye, mild stage: Secondary | ICD-10-CM | POA: Diagnosis not present

## 2020-03-06 DIAGNOSIS — E109 Type 1 diabetes mellitus without complications: Secondary | ICD-10-CM | POA: Diagnosis not present

## 2020-03-06 DIAGNOSIS — Z794 Long term (current) use of insulin: Secondary | ICD-10-CM | POA: Diagnosis not present

## 2020-03-06 DIAGNOSIS — I10 Essential (primary) hypertension: Secondary | ICD-10-CM | POA: Diagnosis not present

## 2020-03-19 ENCOUNTER — Ambulatory Visit: Payer: Medicare Other | Admitting: Orthopaedic Surgery

## 2020-03-19 ENCOUNTER — Ambulatory Visit: Payer: Self-pay

## 2020-03-19 ENCOUNTER — Other Ambulatory Visit: Payer: Self-pay

## 2020-03-19 DIAGNOSIS — G8929 Other chronic pain: Secondary | ICD-10-CM | POA: Diagnosis not present

## 2020-03-19 DIAGNOSIS — M1712 Unilateral primary osteoarthritis, left knee: Secondary | ICD-10-CM

## 2020-03-19 DIAGNOSIS — M79605 Pain in left leg: Secondary | ICD-10-CM

## 2020-03-19 DIAGNOSIS — M25562 Pain in left knee: Secondary | ICD-10-CM | POA: Diagnosis not present

## 2020-03-19 MED ORDER — LIDOCAINE HCL 1 % IJ SOLN
3.0000 mL | INTRAMUSCULAR | Status: AC | PRN
Start: 2020-03-19 — End: 2020-03-19
  Administered 2020-03-19: 3 mL

## 2020-03-19 MED ORDER — METHYLPREDNISOLONE ACETATE 40 MG/ML IJ SUSP
40.0000 mg | INTRAMUSCULAR | Status: AC | PRN
  Administered 2020-03-19: 40 mg via INTRA_ARTICULAR
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
## 2020-03-25 DIAGNOSIS — E109 Type 1 diabetes mellitus without complications: Secondary | ICD-10-CM | POA: Diagnosis not present

## 2020-03-25 DIAGNOSIS — Z794 Long term (current) use of insulin: Secondary | ICD-10-CM | POA: Diagnosis not present

## 2020-03-28 ENCOUNTER — Telehealth: Payer: Self-pay | Admitting: Orthopaedic Surgery

## 2020-03-28 MED ORDER — MELOXICAM 15 MG PO TABS: 15.0000 mg | ORAL_TABLET | Freq: Every day | ORAL | 3 refills | Status: DC | PRN

## 2020-03-28 NOTE — Telephone Encounter (Signed)
Patient called wanting to know if Dr. Magnus Ivan will send in an RX for Meloxicam.  Patient uses [facility] on [address].  CB#[phone].  Thank you.

## 2020-03-28 NOTE — Telephone Encounter (Signed)
I sent some in 

## 2020-03-28 NOTE — Telephone Encounter (Signed)
Ok

## 2020-04-16 ENCOUNTER — Ambulatory Visit: Payer: Medicare Other | Admitting: Orthopaedic Surgery

## 2020-04-16 ENCOUNTER — Telehealth: Payer: Self-pay

## 2020-04-16 NOTE — Telephone Encounter (Signed)
Submitted VOB for SynviscOne, left knee. 

## 2020-04-16 NOTE — Telephone Encounter (Signed)
Noted  

## 2020-04-16 NOTE — Telephone Encounter (Signed)
Left knee gel inj asap, order slipped through the crack somehow

## 2020-04-19 ENCOUNTER — Telehealth: Payer: Self-pay

## 2020-04-19 NOTE — Telephone Encounter (Signed)
Patient aware that she is approved for gel injection.  Approved, SynviscOne, left knee. Buy & Bill Patient will be responsible for 20% OOP. No Co-pay No PA required 

## 2020-05-06 ENCOUNTER — Ambulatory Visit (INDEPENDENT_AMBULATORY_CARE_PROVIDER_SITE_OTHER): Payer: Medicare Other | Admitting: Physician Assistant

## 2020-05-06 ENCOUNTER — Other Ambulatory Visit: Payer: Self-pay

## 2020-05-06 DIAGNOSIS — M1712 Unilateral primary osteoarthritis, left knee: Secondary | ICD-10-CM

## 2020-05-06 MED ORDER — LIDOCAINE HCL 1 % IJ SOLN
3.0000 mL | INTRAMUSCULAR | Status: AC | PRN
  Administered 2020-05-06: 3 mL

## 2020-05-06 MED ORDER — HYLAN G-F 20 48 MG/6ML IX SOSY
48.0000 mg | PREFILLED_SYRINGE | INTRA_ARTICULAR | Status: AC | PRN
  Administered 2020-05-06: 48 mg via INTRA_ARTICULAR

## 2020-05-06 NOTE — Progress Notes (Signed)
   Procedure Note  Patient: [name]             Date of Birth: [birth year]           MRN: [id number]             Visit Date: 05/06/2020 HPI: [name] comes in today for left knee Synvisc 1 injection.  Most pain is medial aspect of the knee.  She did have difficulty getting up and down from seated position.  She is ambulating with a walker.  She has none osteoarthritis left knee with significant medial joint space narrowing. No new injury to the knee.  Physical exam: Left knee slight effusion no abnormal warmth erythema.  Pain with any attempts at range of motion.  Tenderness along medial joint line.  Procedures: Visit Diagnoses:  1. Unilateral primary osteoarthritis, left knee     Large Joint Inj: L knee on 05/06/2020 9:23 AM Indications: pain Details: 22 G 1.5 in needle, anterolateral approach  Arthrogram: No  Medications: 48 mg Hylan 48 MG/6ML; 3 mL lidocaine 1 % Aspirate: 20 mL yellow and blood-tinged Outcome: tolerated well, no immediate complications Procedure, treatment alternatives, risks and benefits explained, specific risks discussed. Consent was given by the patient. Immediately prior to procedure a time out was called to verify the correct patient, procedure, equipment, support staff and site/side marked as required. Patient was prepped and draped in the usual sterile fashion.    Plan: She understands to wait least 6 months between supplemental injections.  Cortisone injections have given her no relief in the past therefore would not recommend needs.  Questions encouraged and answered.

## 2020-06-17 DIAGNOSIS — E109 Type 1 diabetes mellitus without complications: Secondary | ICD-10-CM | POA: Diagnosis not present

## 2020-06-17 DIAGNOSIS — E785 Hyperlipidemia, unspecified: Secondary | ICD-10-CM | POA: Diagnosis not present

## 2020-06-17 DIAGNOSIS — E669 Obesity, unspecified: Secondary | ICD-10-CM | POA: Diagnosis not present

## 2020-06-17 DIAGNOSIS — G4700 Insomnia, unspecified: Secondary | ICD-10-CM | POA: Diagnosis not present

## 2020-06-18 ENCOUNTER — Telehealth: Payer: Self-pay | Admitting: Orthopaedic Surgery

## 2020-06-18 NOTE — Telephone Encounter (Signed)
Patient called requesting a call back from [facility]. Patient has questions about gel injections. Patient's phone number is [phone].

## 2020-06-19 NOTE — Telephone Encounter (Signed)
Patient states the gel injection hasn't helped at all. She is wondering what to do next?

## 2020-06-19 NOTE — Telephone Encounter (Signed)
Can take up to 8 weeks to work if no improvement in next 2 weeks needs return appointment.

## 2020-06-20 NOTE — Telephone Encounter (Signed)
Left message advising

## 2020-06-24 DIAGNOSIS — Z0120 Encounter for dental examination and cleaning without abnormal findings: Secondary | ICD-10-CM | POA: Diagnosis not present

## 2020-07-11 DIAGNOSIS — Z0120 Encounter for dental examination and cleaning without abnormal findings: Secondary | ICD-10-CM | POA: Diagnosis not present

## 2020-07-29 ENCOUNTER — Other Ambulatory Visit: Payer: Self-pay | Admitting: Orthopaedic Surgery

## 2020-08-08 DIAGNOSIS — Z1231 Encounter for screening mammogram for malignant neoplasm of breast: Secondary | ICD-10-CM | POA: Diagnosis not present

## 2020-09-02 DIAGNOSIS — H401121 Primary open-angle glaucoma, left eye, mild stage: Secondary | ICD-10-CM | POA: Diagnosis not present

## 2020-10-07 DIAGNOSIS — E109 Type 1 diabetes mellitus without complications: Secondary | ICD-10-CM | POA: Diagnosis not present

## 2020-10-07 DIAGNOSIS — M859 Disorder of bone density and structure, unspecified: Secondary | ICD-10-CM | POA: Diagnosis not present

## 2020-10-07 DIAGNOSIS — E785 Hyperlipidemia, unspecified: Secondary | ICD-10-CM | POA: Diagnosis not present

## 2020-10-14 DIAGNOSIS — Z0000 Encounter for general adult medical examination without abnormal findings: Secondary | ICD-10-CM | POA: Diagnosis not present

## 2020-10-14 DIAGNOSIS — Z794 Long term (current) use of insulin: Secondary | ICD-10-CM | POA: Diagnosis not present

## 2020-10-14 DIAGNOSIS — R2681 Unsteadiness on feet: Secondary | ICD-10-CM | POA: Diagnosis not present

## 2020-10-14 DIAGNOSIS — I739 Peripheral vascular disease, unspecified: Secondary | ICD-10-CM | POA: Diagnosis not present

## 2020-10-14 DIAGNOSIS — I10 Essential (primary) hypertension: Secondary | ICD-10-CM | POA: Diagnosis not present

## 2020-10-14 DIAGNOSIS — R82998 Other abnormal findings in urine: Secondary | ICD-10-CM | POA: Diagnosis not present

## 2020-10-29 DIAGNOSIS — Z1212 Encounter for screening for malignant neoplasm of rectum: Secondary | ICD-10-CM | POA: Diagnosis not present

## 2020-11-21 DIAGNOSIS — Z23 Encounter for immunization: Secondary | ICD-10-CM | POA: Diagnosis not present

## 2020-12-02 ENCOUNTER — Ambulatory Visit: Payer: Medicare Other | Admitting: Family Medicine

## 2021-01-27 ENCOUNTER — Other Ambulatory Visit: Payer: Self-pay

## 2021-01-27 ENCOUNTER — Telehealth: Payer: Self-pay | Admitting: Family Medicine

## 2021-01-27 ENCOUNTER — Ambulatory Visit: Payer: Medicare Other | Admitting: Family Medicine

## 2021-01-27 DIAGNOSIS — M25511 Pain in right shoulder: Secondary | ICD-10-CM

## 2021-01-27 DIAGNOSIS — M1712 Unilateral primary osteoarthritis, left knee: Secondary | ICD-10-CM | POA: Diagnosis not present

## 2021-01-27 DIAGNOSIS — G8929 Other chronic pain: Secondary | ICD-10-CM

## 2021-01-27 MED ORDER — CELECOXIB 100 MG PO CAPS: 100.0000 mg | ORAL_CAPSULE | Freq: Two times a day (BID) | ORAL | 3 refills | Status: DC | PRN

## 2021-01-27 NOTE — Patient Instructions (Signed)
   Glucosamine Sulfate:  1,000 mg twice daily    

## 2021-01-27 NOTE — Telephone Encounter (Signed)
Noted  
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
## 2021-01-27 NOTE — Telephone Encounter (Signed)
Requesting approval for gel injections for left knee OA.  Patient requests one with a 3-shot series rather than a single dose.

## 2021-02-14 ENCOUNTER — Telehealth: Payer: Self-pay

## 2021-02-14 NOTE — Telephone Encounter (Signed)
Submitted VOB for Synvisc series, left knee. Pending BV.

## 2021-02-17 ENCOUNTER — Telehealth: Payer: Self-pay | Admitting: Family Medicine

## 2021-02-17 NOTE — Telephone Encounter (Signed)
Pt called about if her gel injection was approved and I told her it was just submitted Friday and will probably be at least 2 more weeks.

## 2021-02-21 DIAGNOSIS — I10 Essential (primary) hypertension: Secondary | ICD-10-CM | POA: Diagnosis not present

## 2021-02-21 DIAGNOSIS — G4700 Insomnia, unspecified: Secondary | ICD-10-CM | POA: Diagnosis not present

## 2021-02-21 DIAGNOSIS — E669 Obesity, unspecified: Secondary | ICD-10-CM | POA: Diagnosis not present

## 2021-02-21 DIAGNOSIS — E1051 Type 1 diabetes mellitus with diabetic peripheral angiopathy without gangrene: Secondary | ICD-10-CM | POA: Diagnosis not present

## 2021-03-11 DIAGNOSIS — H18422 Band keratopathy, left eye: Secondary | ICD-10-CM | POA: Diagnosis not present

## 2021-03-11 DIAGNOSIS — H401121 Primary open-angle glaucoma, left eye, mild stage: Secondary | ICD-10-CM | POA: Diagnosis not present

## 2021-03-11 DIAGNOSIS — E103392 Type 1 diabetes mellitus with moderate nonproliferative diabetic retinopathy without macular edema, left eye: Secondary | ICD-10-CM | POA: Diagnosis not present

## 2021-04-03 ENCOUNTER — Telehealth: Payer: Self-pay | Admitting: Family Medicine

## 2021-04-03 ENCOUNTER — Telehealth: Payer: Self-pay

## 2021-04-03 NOTE — Telephone Encounter (Signed)
Called patient and lvm for patient to call back and schedule a appointment with Dr.Hilts  Approved Synvisc One Left Knee Buy&Bill Patient will be responsible for 20% oop $25 Co Pay No PA Required

## 2021-04-03 NOTE — Telephone Encounter (Signed)
Called and left a VM for patient to CB to schedule for gel injections with Dr. Prince Rome.

## 2021-04-03 NOTE — Telephone Encounter (Signed)
Patient called. Would like gel injections in her knee. Says her insurance says she does not need auth first. Her call back number is [phone]

## 2021-04-06 ENCOUNTER — Encounter: Payer: Self-pay | Admitting: Family Medicine

## 2021-04-10 ENCOUNTER — Ambulatory Visit: Payer: Medicare Other | Admitting: Family Medicine

## 2021-04-10 ENCOUNTER — Other Ambulatory Visit: Payer: Self-pay

## 2021-04-10 DIAGNOSIS — M1712 Unilateral primary osteoarthritis, left knee: Secondary | ICD-10-CM

## 2021-04-10 NOTE — Progress Notes (Signed)
Subjective: She is here for planned left knee injection.  Objective: 1+ effusion with no warmth.  Procedure: Left knee Synvisc injection: After sterile prep with Betadine, injected 3 cc 1% lidocaine without epinephrine and Synvisc from superolateral approach, a flash of clear yellow synovial fluid was obtained prior to injection.  Plan: Follow-up in 1 week for the second of 3 injections.

## 2021-04-17 ENCOUNTER — Other Ambulatory Visit: Payer: Self-pay

## 2021-04-17 ENCOUNTER — Ambulatory Visit (INDEPENDENT_AMBULATORY_CARE_PROVIDER_SITE_OTHER): Payer: Medicare Other | Admitting: Family Medicine

## 2021-04-17 ENCOUNTER — Encounter: Payer: Self-pay | Admitting: Family Medicine

## 2021-04-17 DIAGNOSIS — M1712 Unilateral primary osteoarthritis, left knee: Secondary | ICD-10-CM
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
## 2021-04-24 ENCOUNTER — Ambulatory Visit: Payer: Medicare Other | Admitting: Family Medicine

## 2021-04-24 ENCOUNTER — Encounter: Payer: Self-pay | Admitting: Family Medicine

## 2021-04-24 ENCOUNTER — Other Ambulatory Visit: Payer: Self-pay

## 2021-04-24 DIAGNOSIS — M1712 Unilateral primary osteoarthritis, left knee: Secondary | ICD-10-CM | POA: Diagnosis not present
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
## 2021-05-01 ENCOUNTER — Ambulatory Visit: Payer: Medicare Other | Admitting: Family Medicine

## 2021-05-05 ENCOUNTER — Ambulatory Visit: Payer: Self-pay

## 2021-05-05 ENCOUNTER — Ambulatory Visit: Payer: Medicare Other | Admitting: Family Medicine

## 2021-05-05 ENCOUNTER — Other Ambulatory Visit: Payer: Self-pay

## 2021-05-05 ENCOUNTER — Encounter: Payer: Self-pay | Admitting: Family Medicine

## 2021-05-05 DIAGNOSIS — M25511 Pain in right shoulder: Secondary | ICD-10-CM | POA: Diagnosis not present

## 2021-05-05 DIAGNOSIS — G8929 Other chronic pain: Secondary | ICD-10-CM

## 2021-05-05 DIAGNOSIS — M2550 Pain in unspecified joint: Secondary | ICD-10-CM | POA: Diagnosis not present

## 2021-05-05 NOTE — Progress Notes (Signed)
Subjective: Patient is here for ultrasound-guided intra-articular right glenohumeral injection.  Ongoing pain with GH DJD.  Objective:  Very limited active & passive ROM.  Procedure: Ultrasound guided injection is preferred based studies that show increased duration, increased effect, greater accuracy, decreased procedural pain, increased response rate, and decreased cost with ultrasound guided versus blind injection.   Verbal informed consent obtained.  Time-out conducted.  Noted no overlying erythema, induration, or other signs of local infection. Ultrasound-guided right glenohumeral injection: After sterile prep with Betadine, injected 4 cc 0.25% bupivocaine without epinephrine and 6 mg betamethasone using a 22-gauge spinal needle, passing the needle from posterior approach into the glenohumeral joint.  Injectate seen filling joint capsule.

## 2021-05-08 LAB — C-REACTIVE PROTEIN: CRP: 1.7 mg/L (ref <8.0)

## 2021-05-08 LAB — ANTI-NUCLEAR AB-TITER (ANA TITER): ANA Titer 1: 1:1280 {titer} — ABNORMAL HIGH

## 2021-05-08 LAB — ANA: Anti Nuclear Antibody (ANA): POSITIVE — AB

## 2021-05-08 LAB — CYCLIC CITRUL PEPTIDE ANTIBODY, IGG: Cyclic Citrullin Peptide Ab: <16 UNITS

## 2021-05-08 LAB — RHEUMATOID FACTOR: Rheumatoid fact SerPl-aCnc: <14 IU/mL (ref <14)

## 2021-05-08 LAB — SEDIMENTATION RATE: Sed Rate: 11 mm/h (ref 0–30)

## 2021-05-08 LAB — URIC ACID: Uric Acid, Serum: 4.3 mg/dL (ref 2.5–7.0)

## 2021-05-09 ENCOUNTER — Telehealth: Payer: Self-pay | Admitting: Family Medicine

## 2021-05-09 ENCOUNTER — Telehealth: Payer: Self-pay

## 2021-05-09 DIAGNOSIS — R768 Other specified abnormal immunological findings in serum: Secondary | ICD-10-CM

## 2021-05-09 DIAGNOSIS — M2550 Pain in unspecified joint: Secondary | ICD-10-CM

## 2021-05-09 NOTE — Telephone Encounter (Signed)
I called and left voice mail for patient to call me back or send her questions through on MyChart. Dr. Prince Rome is out-of-office today, and I will be leaving soon for the day, so it will be Monday before I can answer any questions.

## 2021-05-09 NOTE — Telephone Encounter (Signed)
ANA is positive with a very high titer.  All else looks good.  I recommend rheumatology consult.

## 2021-05-09 NOTE — Telephone Encounter (Signed)
Patient called she stated she received her results on my chart for the labs she had she is requesting a call back regarding the results call back:[phone]

## 2021-05-15 NOTE — Telephone Encounter (Signed)
Was taken care of through MyChart by Dr. Prince Rome on 05/13/21.

## 2021-05-20 ENCOUNTER — Other Ambulatory Visit: Payer: Self-pay

## 2021-05-20 ENCOUNTER — Ambulatory Visit (INDEPENDENT_AMBULATORY_CARE_PROVIDER_SITE_OTHER): Payer: Medicare Other | Admitting: Family Medicine

## 2021-05-20 DIAGNOSIS — M25562 Pain in left knee: Secondary | ICD-10-CM

## 2021-05-20 DIAGNOSIS — G8929 Other chronic pain: Secondary | ICD-10-CM | POA: Diagnosis not present
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
## 2021-05-21 NOTE — Progress Notes (Signed)
Emailed order, demographics and recent ov note to [place] with [facility]. Had no copy of the insurance card to send.

## 2021-05-30 ENCOUNTER — Other Ambulatory Visit: Payer: Self-pay | Admitting: Family Medicine

## 2021-06-01 ENCOUNTER — Encounter: Payer: Self-pay | Admitting: Family Medicine

## 2021-06-11 DIAGNOSIS — M1712 Unilateral primary osteoarthritis, left knee: Secondary | ICD-10-CM | POA: Diagnosis not present
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
## 2021-07-10 ENCOUNTER — Other Ambulatory Visit: Payer: Self-pay

## 2021-07-10 ENCOUNTER — Encounter: Payer: Self-pay | Admitting: Rheumatology

## 2021-07-10 ENCOUNTER — Ambulatory Visit: Payer: Medicare Other | Admitting: Rheumatology

## 2021-07-10 VITALS — BP 175/88 | HR 58 | Ht 65.25 in | Wt 238.0 lb

## 2021-07-10 DIAGNOSIS — M25511 Pain in right shoulder: Secondary | ICD-10-CM | POA: Diagnosis not present

## 2021-07-10 DIAGNOSIS — Z8639 Personal history of other endocrine, nutritional and metabolic disease: Secondary | ICD-10-CM

## 2021-07-10 DIAGNOSIS — M1712 Unilateral primary osteoarthritis, left knee: Secondary | ICD-10-CM

## 2021-07-10 DIAGNOSIS — G8929 Other chronic pain: Secondary | ICD-10-CM

## 2021-07-10 DIAGNOSIS — M79671 Pain in right foot: Secondary | ICD-10-CM

## 2021-07-10 DIAGNOSIS — E109 Type 1 diabetes mellitus without complications: Secondary | ICD-10-CM | POA: Insufficient documentation

## 2021-07-10 DIAGNOSIS — M79641 Pain in right hand: Secondary | ICD-10-CM | POA: Diagnosis not present

## 2021-07-10 DIAGNOSIS — Z96651 Presence of right artificial knee joint: Secondary | ICD-10-CM

## 2021-07-10 DIAGNOSIS — R200 Anesthesia of skin: Secondary | ICD-10-CM

## 2021-07-10 DIAGNOSIS — M25512 Pain in left shoulder: Secondary | ICD-10-CM

## 2021-07-10 DIAGNOSIS — E1169 Type 2 diabetes mellitus with other specified complication: Secondary | ICD-10-CM | POA: Insufficient documentation

## 2021-07-10 DIAGNOSIS — I83892 Varicose veins of left lower extremities with other complications: Secondary | ICD-10-CM

## 2021-07-10 DIAGNOSIS — R768 Other specified abnormal immunological findings in serum: Secondary | ICD-10-CM

## 2021-07-10 DIAGNOSIS — E785 Hyperlipidemia, unspecified: Secondary | ICD-10-CM

## 2021-07-10 DIAGNOSIS — I10 Essential (primary) hypertension: Secondary | ICD-10-CM

## 2021-07-10 DIAGNOSIS — M79672 Pain in left foot: Secondary | ICD-10-CM

## 2021-07-10 DIAGNOSIS — M2550 Pain in unspecified joint: Secondary | ICD-10-CM

## 2021-07-10 DIAGNOSIS — M79642 Pain in left hand: Secondary | ICD-10-CM

## 2021-07-10 DIAGNOSIS — L439 Lichen planus, unspecified: Secondary | ICD-10-CM

## 2021-07-10 DIAGNOSIS — M8589 Other specified disorders of bone density and structure, multiple sites: Secondary | ICD-10-CM

## 2021-07-11 DIAGNOSIS — E785 Hyperlipidemia, unspecified: Secondary | ICD-10-CM | POA: Diagnosis not present

## 2021-07-11 DIAGNOSIS — I10 Essential (primary) hypertension: Secondary | ICD-10-CM | POA: Diagnosis not present

## 2021-07-11 DIAGNOSIS — E1051 Type 1 diabetes mellitus with diabetic peripheral angiopathy without gangrene: Secondary | ICD-10-CM | POA: Diagnosis not present

## 2021-07-11 DIAGNOSIS — M25562 Pain in left knee: Secondary | ICD-10-CM | POA: Diagnosis not present

## 2021-07-16 DIAGNOSIS — R768 Other specified abnormal immunological findings in serum: Secondary | ICD-10-CM | POA: Diagnosis not present

## 2021-07-16 DIAGNOSIS — D8989 Other specified disorders involving the immune mechanism, not elsewhere classified: Secondary | ICD-10-CM | POA: Diagnosis not present
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
## 2021-08-12 ENCOUNTER — Other Ambulatory Visit: Payer: Self-pay

## 2021-08-12 ENCOUNTER — Ambulatory Visit: Payer: Medicare Other | Admitting: Rheumatology

## 2021-08-12 ENCOUNTER — Encounter: Payer: Self-pay | Admitting: Rheumatology

## 2021-08-12 VITALS — BP 187/72 | HR 62 | Ht 65.25 in | Wt 237.0 lb

## 2021-08-12 DIAGNOSIS — M19072 Primary osteoarthritis, left ankle and foot: Secondary | ICD-10-CM

## 2021-08-12 DIAGNOSIS — M8589 Other specified disorders of bone density and structure, multiple sites: Secondary | ICD-10-CM

## 2021-08-12 DIAGNOSIS — M25511 Pain in right shoulder: Secondary | ICD-10-CM | POA: Diagnosis not present

## 2021-08-12 DIAGNOSIS — L439 Lichen planus, unspecified: Secondary | ICD-10-CM

## 2021-08-12 DIAGNOSIS — R768 Other specified abnormal immunological findings in serum: Secondary | ICD-10-CM

## 2021-08-12 DIAGNOSIS — Z96651 Presence of right artificial knee joint: Secondary | ICD-10-CM | POA: Diagnosis not present

## 2021-08-12 DIAGNOSIS — M25512 Pain in left shoulder: Secondary | ICD-10-CM

## 2021-08-12 DIAGNOSIS — I83892 Varicose veins of left lower extremities with other complications: Secondary | ICD-10-CM

## 2021-08-12 DIAGNOSIS — M19041 Primary osteoarthritis, right hand: Secondary | ICD-10-CM

## 2021-08-12 DIAGNOSIS — M19042 Primary osteoarthritis, left hand: Secondary | ICD-10-CM

## 2021-08-12 DIAGNOSIS — Z8639 Personal history of other endocrine, nutritional and metabolic disease: Secondary | ICD-10-CM

## 2021-08-12 DIAGNOSIS — E785 Hyperlipidemia, unspecified: Secondary | ICD-10-CM

## 2021-08-12 DIAGNOSIS — M19071 Primary osteoarthritis, right ankle and foot: Secondary | ICD-10-CM | POA: Insufficient documentation

## 2021-08-12 DIAGNOSIS — I10 Essential (primary) hypertension: Secondary | ICD-10-CM

## 2021-08-12 DIAGNOSIS — R200 Anesthesia of skin: Secondary | ICD-10-CM

## 2021-08-12 DIAGNOSIS — G8929 Other chronic pain: Secondary | ICD-10-CM

## 2021-08-12 DIAGNOSIS — M1712 Unilateral primary osteoarthritis, left knee: Secondary | ICD-10-CM

## 2021-08-12 NOTE — Patient Instructions (Addendum)
Hydroxychloroquine Tablets What is this medication? HYDROXYCHLOROQUINE (hye drox ee KLOR oh kwin) treats autoimmune conditions, such as rheumatoid arthritis and lupus. It works by slowing down an overactive immune system. It may also be used to prevent and treat malaria. It works by killing the parasite that causes malaria. It belongs to a group of medications called DMARDs. This medicine may be used for other purposes; ask your health care provider or pharmacist if you have questions. COMMON BRAND NAME(S): Plaquenil, Quineprox What should I tell my care team before I take this medication? They need to know if you have any of these conditions: Diabetes Eye disease, vision problems G6PD deficiency Heart disease History of irregular heartbeat If you often drink alcohol Kidney disease Liver disease Porphyria Psoriasis An unusual or allergic reaction to chloroquine, hydroxychloroquine, other medications, foods, dyes, or preservatives Pregnant or trying to get pregnant Breast-feeding How should I use this medication? Take this medication by mouth with a glass of water. Take it as directed on the prescription label. Do not cut, crush or chew this medication. Swallow the tablets whole. Take it with food. Do not take it more than directed. Take all of this medication unless your care team tells you to stop it early. Keep taking it even if you think you are better. Take products with antacids in them at a different time of day than this medication. Take this medication 4 hours before or 4 hours after antacids. Talk to your care team if you have questions. Talk to your care team about the use of this medication in children. While this medication may be prescribed for selected conditions, precautions do apply. Overdosage: If you think you have taken too much of this medicine contact a poison control center or emergency room at once. NOTE: This medicine is only for you. Do not share this medicine with  others. What if I miss a dose? If you miss a dose, take it as soon as you can. If it is almost time for your next dose, take only that dose. Do not take double or extra doses. What may interact with this medication? Do not take this medication with any of the following: Cisapride Dronedarone Pimozide Thioridazine This medication may also interact with the following: Ampicillin Antacids Cimetidine Cyclosporine Digoxin Kaolin Medications for diabetes, like insulin, glipizide, glyburide Medications for seizures like carbamazepine, phenobarbital, phenytoin Mefloquine Methotrexate Other medications that prolong the QT interval (cause an abnormal heart rhythm) Praziquantel This list may not describe all possible interactions. Give your health care provider a list of all the medicines, herbs, non-prescription drugs, or dietary supplements you use. Also tell them if you smoke, drink alcohol, or use illegal drugs. Some items may interact with your medicine. What should I watch for while using this medication? Visit your care team for regular checks on your progress. Tell your care team if your symptoms do not start to get better or if they get worse. You may need blood work done while you are taking this medication. If you take other medications that can affect heart rhythm, you may need more testing. Talk to your care team if you have questions. Your vision may be tested before and during use of this medication. Tell your care team right away if you have any change in your eyesight. This medication may cause serious skin reactions. They can happen weeks to months after starting the medication. Contact your care team right away if you notice fevers or flu-like symptoms with a rash. The   rash may be red or purple and then turn into blisters or peeling of the skin. Or, you might notice a red rash with swelling of the face, lips or lymph nodes in your neck or under your arms. If you or your family  notice any changes in your behavior, such as new or worsening depression, thoughts of harming yourself, anxiety, or other unusual or disturbing thoughts, or memory loss, call your care team right away. What side effects may I notice from receiving this medication? Side effects that you should report to your care team as soon as possible: Allergic reactions-skin rash, itching, hives, swelling of the face, lips, tongue, or throat Aplastic anemia-unusual weakness or fatigue, dizziness, headache, trouble breathing, increased bleeding or bruising Change in vision Heart rhythm changes-fast or irregular heartbeat, dizziness, feeling faint or lightheaded, chest pain, trouble breathing Infection-fever, chills, cough, or sore throat Low blood sugar (hypoglycemia)-tremors or shaking, anxiety, sweating, cold or clammy skin, confusion, dizziness, rapid heartbeat Muscle injury-unusual weakness or fatigue, muscle pain, dark yellow or brown urine, decrease in amount of urine Pain, tingling, or numbness in the hands or feet Rash, fever, and swollen lymph nodes Redness, blistering, peeling, or loosening of the skin, including inside the mouth Thoughts of suicide or self-harm, worsening mood, or feelings of depression Unusual bruising or bleeding Side effects that usually do not require medical attention (report to your care team if they continue or are bothersome): Diarrhea Headache Nausea Stomach pain Vomiting This list may not describe all possible side effects. Call your doctor for medical advice about side effects. You may report side effects to FDA at 1-800-FDA-1088. Where should I keep my medication? Keep out of the reach of children and pets. Store at room temperature up to 30 degrees C (86 degrees F). Protect from light. Get rid of any unused medication after the expiration date. To get rid of medications that are no longer needed or have expired: Take the medication to a medication take-back  program. Check with your pharmacy or law enforcement to find a location. If you cannot return the medication, check the label or package insert to see if the medication should be thrown out in the garbage or flushed down the toilet. If you are not sure, ask your care team. If it is safe to put it in the trash, empty the medication out of the container. Mix the medication with cat litter, dirt, coffee grounds, or other unwanted substance. Seal the mixture in a bag or container. Put it in the trash. NOTE: This sheet is a summary. It may not cover all possible information. If you have questions about this medicine, talk to your doctor, pharmacist, or health care provider.  2022 Elsevier/Gold Standard (2021-02-11 10:19:21)  

## 2021-08-14 DIAGNOSIS — Z1231 Encounter for screening mammogram for malignant neoplasm of breast: Secondary | ICD-10-CM | POA: Diagnosis not present

## 2021-10-01 DIAGNOSIS — Z23 Encounter for immunization: Secondary | ICD-10-CM | POA: Diagnosis not present

## 2021-10-13 ENCOUNTER — Ambulatory Visit: Payer: Medicare Other | Admitting: Physician Assistant

## 2021-10-20 ENCOUNTER — Ambulatory Visit (INDEPENDENT_AMBULATORY_CARE_PROVIDER_SITE_OTHER): Payer: Medicare Other | Admitting: Physician Assistant

## 2021-10-20 ENCOUNTER — Ambulatory Visit: Payer: Self-pay

## 2021-10-20 ENCOUNTER — Encounter: Payer: Self-pay | Admitting: Physician Assistant

## 2021-10-20 DIAGNOSIS — M19012 Primary osteoarthritis, left shoulder: Secondary | ICD-10-CM | POA: Diagnosis not present

## 2021-10-20 DIAGNOSIS — M19111 Post-traumatic osteoarthritis, right shoulder: Secondary | ICD-10-CM

## 2021-10-20 DIAGNOSIS — M1712 Unilateral primary osteoarthritis, left knee: Secondary | ICD-10-CM
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
## 2021-10-21 NOTE — Addendum Note (Signed)
Addended by: Barbette Or on: 10/21/2021 08:52 AM   Modules accepted: Orders

## 2021-10-31 ENCOUNTER — Telehealth: Payer: Self-pay | Admitting: Physical Medicine and Rehabilitation

## 2021-10-31 NOTE — Telephone Encounter (Signed)
Pt called requesting to reschedule appt. Please call pt about this matter at [phone].

## 2021-11-03 ENCOUNTER — Ambulatory Visit: Payer: Medicare Other | Admitting: Physical Medicine and Rehabilitation

## 2021-11-04 ENCOUNTER — Ambulatory Visit: Payer: Medicare Other | Admitting: Physical Medicine and Rehabilitation

## 2021-11-11 ENCOUNTER — Ambulatory Visit: Payer: Medicare Other | Admitting: Physician Assistant

## 2021-11-20 ENCOUNTER — Other Ambulatory Visit: Payer: Self-pay

## 2021-11-20 ENCOUNTER — Ambulatory Visit (INDEPENDENT_AMBULATORY_CARE_PROVIDER_SITE_OTHER): Payer: Medicare Other | Admitting: Physical Medicine and Rehabilitation

## 2021-11-20 ENCOUNTER — Encounter: Payer: Self-pay | Admitting: Physical Medicine and Rehabilitation

## 2021-11-20 ENCOUNTER — Ambulatory Visit: Payer: Self-pay

## 2021-11-20 DIAGNOSIS — M25511 Pain in right shoulder: Secondary | ICD-10-CM | POA: Diagnosis not present

## 2021-11-20 DIAGNOSIS — G8929 Other chronic pain: Secondary | ICD-10-CM | POA: Diagnosis not present

## 2021-11-20 NOTE — Progress Notes (Signed)
Pt state right shoulder pain. Pt state any movement with her right arm. Pt state she takes pain meds to help ease her pain.  Numeric Pain Rating Scale and Functional Assessment Average Pain 4   In the last MONTH (on 0-10 scale) has pain interfered with the following?  1. General activity like being  able to carry out your everyday physical activities such as walking, climbing stairs, carrying groceries, or moving a chair?  Rating(10)   -BT, -Dye Allergies.

## 2021-11-21 DIAGNOSIS — H401121 Primary open-angle glaucoma, left eye, mild stage: Secondary | ICD-10-CM | POA: Diagnosis not present

## 2021-11-21 DIAGNOSIS — H18422 Band keratopathy, left eye: Secondary | ICD-10-CM | POA: Diagnosis not present

## 2021-11-24 DIAGNOSIS — M859 Disorder of bone density and structure, unspecified: Secondary | ICD-10-CM | POA: Diagnosis not present

## 2021-11-24 DIAGNOSIS — E785 Hyperlipidemia, unspecified: Secondary | ICD-10-CM | POA: Diagnosis not present

## 2021-11-24 DIAGNOSIS — E1051 Type 1 diabetes mellitus with diabetic peripheral angiopathy without gangrene: Secondary | ICD-10-CM | POA: Diagnosis not present

## 2021-11-24 DIAGNOSIS — I10 Essential (primary) hypertension: Secondary | ICD-10-CM | POA: Diagnosis not present

## 2021-12-01 DIAGNOSIS — I10 Essential (primary) hypertension: Secondary | ICD-10-CM | POA: Diagnosis not present

## 2021-12-01 DIAGNOSIS — Z0000 Encounter for general adult medical examination without abnormal findings: Secondary | ICD-10-CM | POA: Diagnosis not present

## 2021-12-01 DIAGNOSIS — E1051 Type 1 diabetes mellitus with diabetic peripheral angiopathy without gangrene: Secondary | ICD-10-CM | POA: Diagnosis not present

## 2021-12-01 DIAGNOSIS — R82998 Other abnormal findings in urine: Secondary | ICD-10-CM | POA: Diagnosis not present

## 2021-12-01 DIAGNOSIS — Z794 Long term (current) use of insulin: Secondary | ICD-10-CM | POA: Diagnosis not present

## 2021-12-01 DIAGNOSIS — Z23 Encounter for immunization: Secondary | ICD-10-CM | POA: Diagnosis not present

## 2021-12-01 DIAGNOSIS — I739 Peripheral vascular disease, unspecified: Secondary | ICD-10-CM | POA: Diagnosis not present

## 2021-12-14 MED ORDER — BUPIVACAINE HCL 0.25 % IJ SOLN
5.0000 mL | INTRAMUSCULAR | Status: AC | PRN
  Administered 2021-11-20: 5 mL via INTRA_ARTICULAR

## 2021-12-14 MED ORDER — TRIAMCINOLONE ACETONIDE 40 MG/ML IJ SUSP
40.0000 mg | INTRAMUSCULAR | Status: AC | PRN
  Administered 2021-11-20: 40 mg via INTRA_ARTICULAR

## 2021-12-14 NOTE — Progress Notes (Signed)
° °  [name] - 71 y.o. female MRN [id number]  Date of birth: 1951-08-26  Office Visit Note: Visit Date: 11/20/2021 PCP: Garlan Fillers, MD Referred by: Kirtland Bouchard, PA-C  Subjective: Chief Complaint  Patient presents with   Right Shoulder - Pain   HPI:  [name] is a 71 y.o. female who comes in today at the request of Dr. Burnard Bunting for planned Right anesthetic glenohumeral arthrogram with fluoroscopic guidance.  The patient has failed conservative care including home exercise, medications, time and activity modification.  This injection will be diagnostic and hopefully therapeutic.  Please see requesting physician notes for further details and justification.   ROS Otherwise per HPI.  Assessment & Plan: Visit Diagnoses:    ICD-10-CM   1. Chronic right shoulder pain  M25.511 XR C-ARM NO REPORT   G89.29       Plan: No additional findings.   Meds & Orders: No orders of the defined types were placed in this encounter.   Orders Placed This Encounter  Procedures   Large Joint Inj   XR C-ARM NO REPORT    Follow-up: No follow-ups on file.   Procedures: Large Joint Inj: R glenohumeral on 11/20/2021 3:02 PM Indications: pain and diagnostic evaluation Details: 22 G 3.5 in needle, fluoroscopy-guided anteromedial approach  Arthrogram: No  Medications: 40 mg triamcinolone acetonide 40 MG/ML; 5 mL bupivacaine 0.25 % Outcome: tolerated well, no immediate complications  There was excellent flow of contrast producing a partial arthrogram of the glenohumeral joint. The patient did have relief of symptoms during the anesthetic phase of the injection. Procedure, treatment alternatives, risks and benefits explained, specific risks discussed. Consent was given by the patient. Immediately prior to procedure a time out was called to verify the correct patient, procedure, equipment, support staff and site/side marked as required. Patient was prepped and draped in the usual sterile  fashion.         Clinical History: No specialty comments available.     Objective:  VS:  HT:     WT:    BMI:      BP:    HR: bpm   TEMP: ( )   RESP:  Physical Exam   Imaging: No results found.

## 2021-12-17 DIAGNOSIS — Z1212 Encounter for screening for malignant neoplasm of rectum: Secondary | ICD-10-CM | POA: Diagnosis not present

## 2021-12-23 DIAGNOSIS — R82998 Other abnormal findings in urine: Secondary | ICD-10-CM | POA: Diagnosis not present

## 2021-12-23 DIAGNOSIS — I10 Essential (primary) hypertension: Secondary | ICD-10-CM | POA: Diagnosis not present

## 2022-01-13 DIAGNOSIS — N390 Urinary tract infection, site not specified: Secondary | ICD-10-CM | POA: Diagnosis not present

## 2022-02-06 DIAGNOSIS — N3941 Urge incontinence: Secondary | ICD-10-CM | POA: Diagnosis not present

## 2022-02-06 DIAGNOSIS — R3121 Asymptomatic microscopic hematuria: Secondary | ICD-10-CM | POA: Diagnosis not present

## 2022-02-18 DIAGNOSIS — R3121 Asymptomatic microscopic hematuria: Secondary | ICD-10-CM | POA: Diagnosis not present

## 2022-02-18 DIAGNOSIS — I700 Atherosclerosis of aorta: Secondary | ICD-10-CM | POA: Diagnosis not present

## 2022-02-18 DIAGNOSIS — N200 Calculus of kidney: Secondary | ICD-10-CM | POA: Diagnosis not present

## 2022-03-28 DIAGNOSIS — E109 Type 1 diabetes mellitus without complications: Secondary | ICD-10-CM | POA: Diagnosis not present

## 2022-04-02 DIAGNOSIS — E1059 Type 1 diabetes mellitus with other circulatory complications: Secondary | ICD-10-CM | POA: Diagnosis not present

## 2022-04-02 DIAGNOSIS — I10 Essential (primary) hypertension: Secondary | ICD-10-CM | POA: Diagnosis not present

## 2022-04-02 DIAGNOSIS — I739 Peripheral vascular disease, unspecified: Secondary | ICD-10-CM | POA: Diagnosis not present

## 2022-04-02 DIAGNOSIS — E1051 Type 1 diabetes mellitus with diabetic peripheral angiopathy without gangrene: Secondary | ICD-10-CM | POA: Diagnosis not present

## 2022-04-06 DIAGNOSIS — I739 Peripheral vascular disease, unspecified: Secondary | ICD-10-CM | POA: Diagnosis not present

## 2022-04-06 DIAGNOSIS — E1059 Type 1 diabetes mellitus with other circulatory complications: Secondary | ICD-10-CM | POA: Diagnosis not present

## 2022-04-06 DIAGNOSIS — L97811 Non-pressure chronic ulcer of other part of right lower leg limited to breakdown of skin: Secondary | ICD-10-CM | POA: Diagnosis not present

## 2022-04-06 DIAGNOSIS — I872 Venous insufficiency (chronic) (peripheral): Secondary | ICD-10-CM | POA: Diagnosis not present

## 2022-04-14 DIAGNOSIS — L97811 Non-pressure chronic ulcer of other part of right lower leg limited to breakdown of skin: Secondary | ICD-10-CM | POA: Diagnosis not present

## 2022-04-14 DIAGNOSIS — I872 Venous insufficiency (chronic) (peripheral): Secondary | ICD-10-CM | POA: Diagnosis not present

## 2022-04-14 DIAGNOSIS — I739 Peripheral vascular disease, unspecified: Secondary | ICD-10-CM | POA: Diagnosis not present

## 2022-04-17 DIAGNOSIS — R3121 Asymptomatic microscopic hematuria: Secondary | ICD-10-CM | POA: Diagnosis not present

## 2022-04-17 DIAGNOSIS — N3941 Urge incontinence: Secondary | ICD-10-CM | POA: Diagnosis not present

## 2022-05-11 DIAGNOSIS — M19111 Post-traumatic osteoarthritis, right shoulder: Secondary | ICD-10-CM | POA: Diagnosis not present

## 2022-05-12 ENCOUNTER — Other Ambulatory Visit: Payer: Self-pay | Admitting: Orthopedic Surgery

## 2022-05-12 DIAGNOSIS — M19111 Post-traumatic osteoarthritis, right shoulder: Secondary | ICD-10-CM

## 2022-05-26 ENCOUNTER — Other Ambulatory Visit: Payer: Medicare Other

## 2022-06-09 DIAGNOSIS — H18422 Band keratopathy, left eye: Secondary | ICD-10-CM | POA: Diagnosis not present

## 2022-06-09 DIAGNOSIS — H401121 Primary open-angle glaucoma, left eye, mild stage: Secondary | ICD-10-CM | POA: Diagnosis not present

## 2022-06-25 DIAGNOSIS — N390 Urinary tract infection, site not specified: Secondary | ICD-10-CM | POA: Diagnosis not present

## 2022-07-07 ENCOUNTER — Emergency Department (HOSPITAL_BASED_OUTPATIENT_CLINIC_OR_DEPARTMENT_OTHER): Payer: Medicare Other

## 2022-07-07 ENCOUNTER — Other Ambulatory Visit: Payer: Self-pay

## 2022-07-07 ENCOUNTER — Encounter (HOSPITAL_BASED_OUTPATIENT_CLINIC_OR_DEPARTMENT_OTHER): Payer: Self-pay | Admitting: Obstetrics and Gynecology

## 2022-07-07 ENCOUNTER — Emergency Department (HOSPITAL_BASED_OUTPATIENT_CLINIC_OR_DEPARTMENT_OTHER)
Admission: EM | Admit: 2022-07-07 | Discharge: 2022-07-07 | Disposition: A | Discharge status: Home / Self Care | Payer: Medicare Other | Attending: Emergency Medicine | Admitting: Emergency Medicine

## 2022-07-07 DIAGNOSIS — I10 Essential (primary) hypertension: Secondary | ICD-10-CM | POA: Insufficient documentation

## 2022-07-07 DIAGNOSIS — N132 Hydronephrosis with renal and ureteral calculous obstruction: Secondary | ICD-10-CM | POA: Insufficient documentation

## 2022-07-07 DIAGNOSIS — Z794 Long term (current) use of insulin: Secondary | ICD-10-CM | POA: Insufficient documentation

## 2022-07-07 DIAGNOSIS — R1030 Lower abdominal pain, unspecified: Secondary | ICD-10-CM | POA: Diagnosis present

## 2022-07-07 DIAGNOSIS — Z79899 Other long term (current) drug therapy: Secondary | ICD-10-CM | POA: Insufficient documentation

## 2022-07-07 DIAGNOSIS — E119 Type 2 diabetes mellitus without complications: Secondary | ICD-10-CM | POA: Diagnosis not present

## 2022-07-07 DIAGNOSIS — R112 Nausea with vomiting, unspecified: Secondary | ICD-10-CM | POA: Diagnosis not present

## 2022-07-07 DIAGNOSIS — R197 Diarrhea, unspecified: Secondary | ICD-10-CM | POA: Insufficient documentation

## 2022-07-07 DIAGNOSIS — N201 Calculus of ureter: Secondary | ICD-10-CM | POA: Diagnosis not present

## 2022-07-07 LAB — COMPREHENSIVE METABOLIC PANEL
ALT: 8 U/L (ref 0–44)
AST: 14 U/L — ABNORMAL LOW (ref 15–41)
Albumin: 3.5 g/dL (ref 3.5–5.0)
Alkaline Phosphatase: 81 U/L (ref 38–126)
Anion gap: 9 (ref 5–15)
BUN: 17 mg/dL (ref 8–23)
CO2: 34 mmol/L — ABNORMAL HIGH (ref 22–32)
Calcium: 9.5 mg/dL (ref 8.9–10.3)
Chloride: 96 mmol/L — ABNORMAL LOW (ref 98–111)
Creatinine, Ser: 0.94 mg/dL (ref 0.44–1.00)
GFR, Estimated: >60 mL/min (ref >60)
Glucose, Bld: 197 mg/dL — ABNORMAL HIGH (ref 70–99)
Potassium: 4.3 mmol/L (ref 3.5–5.1)
Sodium: 139 mmol/L (ref 135–145)
Total Bilirubin: 0.6 mg/dL (ref 0.3–1.2)
Total Protein: 6.7 g/dL (ref 6.5–8.1)

## 2022-07-07 LAB — CBC
HCT: 44.5 % (ref 36.0–46.0)
Hemoglobin: 14 g/dL (ref 12.0–15.0)
MCH: 27.2 pg (ref 26.0–34.0)
MCHC: 31.5 g/dL (ref 30.0–36.0)
MCV: 86.6 fL (ref 80.0–100.0)
Platelets: 297 10*3/uL (ref 150–400)
RBC: 5.14 MIL/uL — ABNORMAL HIGH (ref 3.87–5.11)
RDW: 14 % (ref 11.5–15.5)
WBC: 9 10*3/uL (ref 4.0–10.5)
nRBC: 0 % (ref 0.0–0.2)

## 2022-07-07 LAB — URINALYSIS, ROUTINE W REFLEX MICROSCOPIC
Bilirubin Urine: NEGATIVE
Glucose, UA: NEGATIVE mg/dL
Nitrite: NEGATIVE
Specific Gravity, Urine: 1.015 (ref 1.005–1.030)
pH: 6.5 (ref 5.0–8.0)

## 2022-07-07 LAB — LIPASE, BLOOD: Lipase: 10 U/L — ABNORMAL LOW (ref 11–51)

## 2022-07-07 MED ORDER — IOHEXOL 300 MG/ML  SOLN
100.0000 mL | Freq: Once | INTRAMUSCULAR | Status: AC | PRN
  Administered 2022-07-07: 85 mL via INTRAVENOUS

## 2022-07-07 MED ORDER — SODIUM CHLORIDE 0.9 % IV BOLUS
500.0000 mL | Freq: Once | INTRAVENOUS | Status: AC
  Administered 2022-07-07: 500 mL via INTRAVENOUS

## 2022-07-07 MED ORDER — ONDANSETRON 4 MG PO TBDP: 4.0000 mg | ORAL_TABLET | Freq: Three times a day (TID) | ORAL | 0 refills | Status: DC | PRN

## 2022-07-07 MED ORDER — OXYCODONE-ACETAMINOPHEN 5-325 MG PO TABS: 1.0000 | ORAL_TABLET | Freq: Four times a day (QID) | ORAL | 0 refills | Status: DC | PRN

## 2022-07-07 MED ORDER — ONDANSETRON HCL 4 MG/2ML IJ SOLN
4.0000 mg | Freq: Once | INTRAMUSCULAR | Status: AC
  Administered 2022-07-07: 4 mg via INTRAVENOUS
  Filled 2022-07-07: qty 2

## 2022-07-07 NOTE — ED Triage Notes (Signed)
Patient reports abdominal pain with N/V/D. Patient reports she has been having these symptoms x2 weeks. Patient reports she had a urinalysis done for UTI and it was negative. Patient has a hx of kidney stones and she did have blood in her urine.
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
## 2022-07-07 NOTE — Discharge Instructions (Addendum)
Follow-up with urology.  Try and keep yourself hydrated.  Take pain medicine and nausea medicine as needed.  Take either the oxycodone or the tramadol but do not take both.

## 2022-07-14 DIAGNOSIS — R1084 Generalized abdominal pain: Secondary | ICD-10-CM | POA: Diagnosis not present

## 2022-07-14 DIAGNOSIS — I10 Essential (primary) hypertension: Secondary | ICD-10-CM | POA: Diagnosis not present

## 2022-07-14 DIAGNOSIS — N200 Calculus of kidney: Secondary | ICD-10-CM | POA: Diagnosis not present

## 2022-07-15 ENCOUNTER — Other Ambulatory Visit: Payer: Medicare Other

## 2022-08-20 DIAGNOSIS — Z1231 Encounter for screening mammogram for malignant neoplasm of breast: Secondary | ICD-10-CM | POA: Diagnosis not present

## 2022-08-25 DIAGNOSIS — R2681 Unsteadiness on feet: Secondary | ICD-10-CM | POA: Diagnosis not present

## 2022-08-25 DIAGNOSIS — I739 Peripheral vascular disease, unspecified: Secondary | ICD-10-CM | POA: Diagnosis not present

## 2022-08-25 DIAGNOSIS — E1051 Type 1 diabetes mellitus with diabetic peripheral angiopathy without gangrene: Secondary | ICD-10-CM | POA: Diagnosis not present

## 2022-08-25 DIAGNOSIS — Z794 Long term (current) use of insulin: Secondary | ICD-10-CM | POA: Diagnosis not present

## 2022-09-10 DIAGNOSIS — M25562 Pain in left knee: Secondary | ICD-10-CM | POA: Diagnosis not present

## 2022-09-10 DIAGNOSIS — M25511 Pain in right shoulder: Secondary | ICD-10-CM | POA: Diagnosis not present

## 2022-09-10 DIAGNOSIS — R2681 Unsteadiness on feet: Secondary | ICD-10-CM | POA: Diagnosis not present

## 2022-09-11 DIAGNOSIS — R928 Other abnormal and inconclusive findings on diagnostic imaging of breast: Secondary | ICD-10-CM | POA: Diagnosis not present

## 2022-09-11 DIAGNOSIS — R92322 Mammographic fibroglandular density, left breast: Secondary | ICD-10-CM | POA: Diagnosis not present

## 2022-09-18 DIAGNOSIS — N200 Calculus of kidney: Secondary | ICD-10-CM | POA: Diagnosis not present

## 2022-09-18 DIAGNOSIS — R3915 Urgency of urination: Secondary | ICD-10-CM | POA: Diagnosis not present

## 2022-09-22 ENCOUNTER — Other Ambulatory Visit: Payer: Self-pay | Admitting: Orthopedic Surgery

## 2022-09-22 DIAGNOSIS — M19111 Post-traumatic osteoarthritis, right shoulder: Secondary | ICD-10-CM

## 2022-10-11 DIAGNOSIS — M25511 Pain in right shoulder: Secondary | ICD-10-CM | POA: Diagnosis not present

## 2022-10-11 DIAGNOSIS — M25562 Pain in left knee: Secondary | ICD-10-CM | POA: Diagnosis not present

## 2022-10-11 DIAGNOSIS — R2681 Unsteadiness on feet: Secondary | ICD-10-CM | POA: Diagnosis not present

## 2022-10-22 ENCOUNTER — Inpatient Hospital Stay: Admission: RE | Admit: 2022-10-22 | Payer: Medicare Other | Source: Ambulatory Visit

## 2022-11-10 DIAGNOSIS — M25511 Pain in right shoulder: Secondary | ICD-10-CM | POA: Diagnosis not present

## 2022-11-10 DIAGNOSIS — M25562 Pain in left knee: Secondary | ICD-10-CM | POA: Diagnosis not present

## 2022-11-10 DIAGNOSIS — R2681 Unsteadiness on feet: Secondary | ICD-10-CM | POA: Diagnosis not present

## 2022-11-26 ENCOUNTER — Emergency Department (HOSPITAL_COMMUNITY): Payer: Medicare Other

## 2022-11-26 ENCOUNTER — Inpatient Hospital Stay (HOSPITAL_COMMUNITY)
Admission: EM | Admit: 2022-11-26 | Discharge: 2022-12-04 | DRG: 291 | Disposition: A | Discharge status: Rehabilitation Facility | Payer: Medicare Other | Attending: Internal Medicine | Admitting: Internal Medicine

## 2022-11-26 DIAGNOSIS — M25511 Pain in right shoulder: Secondary | ICD-10-CM | POA: Diagnosis not present

## 2022-11-26 DIAGNOSIS — E875 Hyperkalemia: Secondary | ICD-10-CM | POA: Diagnosis present

## 2022-11-26 DIAGNOSIS — R55 Syncope and collapse: Secondary | ICD-10-CM | POA: Diagnosis not present

## 2022-11-26 DIAGNOSIS — Z79899 Other long term (current) drug therapy: Secondary | ICD-10-CM | POA: Diagnosis not present

## 2022-11-26 DIAGNOSIS — E785 Hyperlipidemia, unspecified: Secondary | ICD-10-CM | POA: Diagnosis present

## 2022-11-26 DIAGNOSIS — E1169 Type 2 diabetes mellitus with other specified complication: Secondary | ICD-10-CM | POA: Diagnosis not present

## 2022-11-26 DIAGNOSIS — H5461 Unqualified visual loss, right eye, normal vision left eye: Secondary | ICD-10-CM | POA: Diagnosis present

## 2022-11-26 DIAGNOSIS — M1711 Unilateral primary osteoarthritis, right knee: Secondary | ICD-10-CM | POA: Diagnosis not present

## 2022-11-26 DIAGNOSIS — I10 Essential (primary) hypertension: Secondary | ICD-10-CM | POA: Diagnosis present

## 2022-11-26 DIAGNOSIS — R739 Hyperglycemia, unspecified: Secondary | ICD-10-CM | POA: Diagnosis not present

## 2022-11-26 DIAGNOSIS — B9620 Unspecified Escherichia coli [E. coli] as the cause of diseases classified elsewhere: Secondary | ICD-10-CM | POA: Diagnosis not present

## 2022-11-26 DIAGNOSIS — E1069 Type 1 diabetes mellitus with other specified complication: Secondary | ICD-10-CM | POA: Diagnosis not present

## 2022-11-26 DIAGNOSIS — Z885 Allergy status to narcotic agent status: Secondary | ICD-10-CM

## 2022-11-26 DIAGNOSIS — M19011 Primary osteoarthritis, right shoulder: Secondary | ICD-10-CM | POA: Diagnosis not present

## 2022-11-26 DIAGNOSIS — N179 Acute kidney failure, unspecified: Secondary | ICD-10-CM | POA: Diagnosis present

## 2022-11-26 DIAGNOSIS — R0602 Shortness of breath: Secondary | ICD-10-CM | POA: Diagnosis not present

## 2022-11-26 DIAGNOSIS — I5032 Chronic diastolic (congestive) heart failure: Secondary | ICD-10-CM | POA: Diagnosis present

## 2022-11-26 DIAGNOSIS — K5900 Constipation, unspecified: Secondary | ICD-10-CM | POA: Diagnosis not present

## 2022-11-26 DIAGNOSIS — F419 Anxiety disorder, unspecified: Secondary | ICD-10-CM | POA: Diagnosis not present

## 2022-11-26 DIAGNOSIS — J9811 Atelectasis: Secondary | ICD-10-CM | POA: Diagnosis not present

## 2022-11-26 DIAGNOSIS — J910 Malignant pleural effusion: Secondary | ICD-10-CM | POA: Diagnosis not present

## 2022-11-26 DIAGNOSIS — I5031 Acute diastolic (congestive) heart failure: Secondary | ICD-10-CM | POA: Diagnosis not present

## 2022-11-26 DIAGNOSIS — R197 Diarrhea, unspecified: Secondary | ICD-10-CM | POA: Diagnosis not present

## 2022-11-26 DIAGNOSIS — I110 Hypertensive heart disease with heart failure: Secondary | ICD-10-CM | POA: Diagnosis not present

## 2022-11-26 DIAGNOSIS — M1712 Unilateral primary osteoarthritis, left knee: Secondary | ICD-10-CM | POA: Diagnosis not present

## 2022-11-26 DIAGNOSIS — R5381 Other malaise: Secondary | ICD-10-CM | POA: Diagnosis not present

## 2022-11-26 DIAGNOSIS — I4510 Unspecified right bundle-branch block: Secondary | ICD-10-CM | POA: Diagnosis present

## 2022-11-26 DIAGNOSIS — I5033 Acute on chronic diastolic (congestive) heart failure: Secondary | ICD-10-CM | POA: Insufficient documentation

## 2022-11-26 DIAGNOSIS — K219 Gastro-esophageal reflux disease without esophagitis: Secondary | ICD-10-CM | POA: Diagnosis not present

## 2022-11-26 DIAGNOSIS — R52 Pain, unspecified: Secondary | ICD-10-CM | POA: Diagnosis not present

## 2022-11-26 DIAGNOSIS — Z1152 Encounter for screening for COVID-19: Secondary | ICD-10-CM

## 2022-11-26 DIAGNOSIS — Z888 Allergy status to other drugs, medicaments and biological substances status: Secondary | ICD-10-CM

## 2022-11-26 DIAGNOSIS — R296 Repeated falls: Secondary | ICD-10-CM | POA: Diagnosis present

## 2022-11-26 DIAGNOSIS — Z1611 Resistance to penicillins: Secondary | ICD-10-CM | POA: Diagnosis not present

## 2022-11-26 DIAGNOSIS — E876 Hypokalemia: Secondary | ICD-10-CM | POA: Diagnosis not present

## 2022-11-26 DIAGNOSIS — Z6838 Body mass index (BMI) 38.0-38.9, adult: Secondary | ICD-10-CM

## 2022-11-26 DIAGNOSIS — Z8249 Family history of ischemic heart disease and other diseases of the circulatory system: Secondary | ICD-10-CM | POA: Diagnosis not present

## 2022-11-26 DIAGNOSIS — I480 Paroxysmal atrial fibrillation: Secondary | ICD-10-CM

## 2022-11-26 DIAGNOSIS — G4700 Insomnia, unspecified: Secondary | ICD-10-CM | POA: Diagnosis not present

## 2022-11-26 DIAGNOSIS — M1990 Unspecified osteoarthritis, unspecified site: Secondary | ICD-10-CM | POA: Diagnosis not present

## 2022-11-26 DIAGNOSIS — J90 Pleural effusion, not elsewhere classified: Secondary | ICD-10-CM | POA: Diagnosis not present

## 2022-11-26 DIAGNOSIS — F411 Generalized anxiety disorder: Secondary | ICD-10-CM | POA: Diagnosis not present

## 2022-11-26 DIAGNOSIS — J9601 Acute respiratory failure with hypoxia: Secondary | ICD-10-CM

## 2022-11-26 DIAGNOSIS — R7989 Other specified abnormal findings of blood chemistry: Secondary | ICD-10-CM | POA: Diagnosis not present

## 2022-11-26 DIAGNOSIS — U071 COVID-19: Secondary | ICD-10-CM | POA: Diagnosis not present

## 2022-11-26 DIAGNOSIS — Z7983 Long term (current) use of bisphosphonates: Secondary | ICD-10-CM

## 2022-11-26 DIAGNOSIS — Z794 Long term (current) use of insulin: Secondary | ICD-10-CM | POA: Diagnosis not present

## 2022-11-26 DIAGNOSIS — I4891 Unspecified atrial fibrillation: Secondary | ICD-10-CM | POA: Diagnosis not present

## 2022-11-26 DIAGNOSIS — E1065 Type 1 diabetes mellitus with hyperglycemia: Secondary | ICD-10-CM | POA: Diagnosis not present

## 2022-11-26 DIAGNOSIS — I050 Rheumatic mitral stenosis: Secondary | ICD-10-CM | POA: Diagnosis not present

## 2022-11-26 DIAGNOSIS — N3944 Nocturnal enuresis: Secondary | ICD-10-CM | POA: Diagnosis not present

## 2022-11-26 DIAGNOSIS — E109 Type 1 diabetes mellitus without complications: Secondary | ICD-10-CM | POA: Diagnosis present

## 2022-11-26 DIAGNOSIS — R0902 Hypoxemia: Secondary | ICD-10-CM | POA: Diagnosis not present

## 2022-11-26 DIAGNOSIS — E669 Obesity, unspecified: Secondary | ICD-10-CM | POA: Diagnosis not present

## 2022-11-26 DIAGNOSIS — T502X5A Adverse effect of carbonic-anhydrase inhibitors, benzothiadiazides and other diuretics, initial encounter: Secondary | ICD-10-CM | POA: Diagnosis not present

## 2022-11-26 DIAGNOSIS — R000 Tachycardia, unspecified: Secondary | ICD-10-CM | POA: Diagnosis not present

## 2022-11-26 DIAGNOSIS — H409 Unspecified glaucoma: Secondary | ICD-10-CM | POA: Diagnosis present

## 2022-11-26 DIAGNOSIS — R42 Dizziness and giddiness: Secondary | ICD-10-CM | POA: Diagnosis not present

## 2022-11-26 DIAGNOSIS — N390 Urinary tract infection, site not specified: Secondary | ICD-10-CM | POA: Diagnosis not present

## 2022-11-26 DIAGNOSIS — Z96651 Presence of right artificial knee joint: Secondary | ICD-10-CM | POA: Diagnosis present

## 2022-11-26 DIAGNOSIS — R918 Other nonspecific abnormal finding of lung field: Secondary | ICD-10-CM | POA: Diagnosis not present

## 2022-11-26 DIAGNOSIS — K589 Irritable bowel syndrome without diarrhea: Secondary | ICD-10-CM | POA: Diagnosis present

## 2022-11-26 DIAGNOSIS — N3281 Overactive bladder: Secondary | ICD-10-CM | POA: Diagnosis not present

## 2022-11-26 DIAGNOSIS — J1282 Pneumonia due to coronavirus disease 2019: Secondary | ICD-10-CM | POA: Diagnosis not present

## 2022-11-26 DIAGNOSIS — J069 Acute upper respiratory infection, unspecified: Secondary | ICD-10-CM | POA: Diagnosis not present

## 2022-11-26 DIAGNOSIS — E66812 Obesity, class 2: Secondary | ICD-10-CM | POA: Diagnosis present

## 2022-11-26 DIAGNOSIS — K5901 Slow transit constipation: Secondary | ICD-10-CM | POA: Diagnosis not present

## 2022-11-26 DIAGNOSIS — N3000 Acute cystitis without hematuria: Secondary | ICD-10-CM | POA: Diagnosis not present

## 2022-11-26 DIAGNOSIS — E10649 Type 1 diabetes mellitus with hypoglycemia without coma: Secondary | ICD-10-CM | POA: Diagnosis not present

## 2022-11-26 DIAGNOSIS — M21371 Foot drop, right foot: Secondary | ICD-10-CM | POA: Diagnosis not present

## 2022-11-26 LAB — CBC WITH DIFFERENTIAL/PLATELET
Abs Immature Granulocytes: 0.03 10*3/uL (ref 0.00–0.07)
Basophils Absolute: 0.1 10*3/uL (ref 0.0–0.1)
Basophils Relative: 1 %
Eosinophils Absolute: 0 10*3/uL (ref 0.0–0.5)
Eosinophils Relative: 0 %
HCT: 51 % — ABNORMAL HIGH (ref 36.0–46.0)
Hemoglobin: 15.1 g/dL — ABNORMAL HIGH (ref 12.0–15.0)
Immature Granulocytes: 0 %
Lymphocytes Relative: 16 %
Lymphs Abs: 1.1 10*3/uL (ref 0.7–4.0)
MCH: 26.8 pg (ref 26.0–34.0)
MCHC: 29.6 g/dL — ABNORMAL LOW (ref 30.0–36.0)
MCV: 90.6 fL (ref 80.0–100.0)
Monocytes Absolute: 0.5 10*3/uL (ref 0.1–1.0)
Monocytes Relative: 8 %
Neutro Abs: 5.1 10*3/uL (ref 1.7–7.7)
Neutrophils Relative %: 75 %
Platelets: 184 10*3/uL (ref 150–400)
RBC: 5.63 MIL/uL — ABNORMAL HIGH (ref 3.87–5.11)
RDW: 16.4 % — ABNORMAL HIGH (ref 11.5–15.5)
WBC: 6.8 10*3/uL (ref 4.0–10.5)
nRBC: 0 % (ref 0.0–0.2)

## 2022-11-26 LAB — BASIC METABOLIC PANEL
Anion gap: 15 (ref 5–15)
BUN: 19 mg/dL (ref 8–23)
CO2: 20 mmol/L — ABNORMAL LOW (ref 22–32)
Calcium: 9.3 mg/dL (ref 8.9–10.3)
Chloride: 103 mmol/L (ref 98–111)
Creatinine, Ser: 1.13 mg/dL — ABNORMAL HIGH (ref 0.44–1.00)
GFR, Estimated: 51 mL/min — ABNORMAL LOW (ref >60)
Glucose, Bld: 285 mg/dL — ABNORMAL HIGH (ref 70–99)
Potassium: 4.6 mmol/L (ref 3.5–5.1)
Sodium: 138 mmol/L (ref 135–145)

## 2022-11-26 LAB — TSH: TSH: 2.82 u[IU]/mL (ref 0.350–4.500)

## 2022-11-26 LAB — CBG MONITORING, ED
Glucose-Capillary: 320 mg/dL — ABNORMAL HIGH (ref 70–99)
Glucose-Capillary: 258 mg/dL — ABNORMAL HIGH (ref 70–99)

## 2022-11-26 LAB — RESP PANEL BY RT-PCR (RSV, FLU A&B, COVID)  RVPGX2
Influenza A by PCR: NEGATIVE
Influenza B by PCR: NEGATIVE
Resp Syncytial Virus by PCR: NEGATIVE
SARS Coronavirus 2 by RT PCR: NEGATIVE

## 2022-11-26 LAB — TROPONIN I (HIGH SENSITIVITY)
Troponin I (High Sensitivity): 15 ng/L (ref <18)
Troponin I (High Sensitivity): 14 ng/L (ref <18)

## 2022-11-26 LAB — BRAIN NATRIURETIC PEPTIDE: B Natriuretic Peptide: 548.1 pg/mL — ABNORMAL HIGH (ref 0.0–100.0)

## 2022-11-26 LAB — MAGNESIUM: Magnesium: 1.9 mg/dL (ref 1.7–2.4)

## 2022-11-26 MED ORDER — ENOXAPARIN SODIUM 40 MG/0.4ML IJ SOSY
40.0000 mg | PREFILLED_SYRINGE | INTRAMUSCULAR | Status: DC
  Administered 2022-11-26 – 2022-12-03 (×8): 40 mg via SUBCUTANEOUS
  Filled 2022-11-26 (×8): qty 0.4

## 2022-11-26 MED ORDER — MIRABEGRON ER 25 MG PO TB24
50.0000 mg | ORAL_TABLET | Freq: Every day | ORAL | Status: DC
  Administered 2022-11-29 – 2022-12-04 (×6): 50 mg via ORAL
  Filled 2022-11-26 (×2): qty 2
  Filled 2022-11-26: qty 1
  Filled 2022-11-26 (×5): qty 2

## 2022-11-26 MED ORDER — DIGOXIN 0.25 MG/ML IJ SOLN
0.1250 mg | Freq: Four times a day (QID) | INTRAMUSCULAR | Status: AC
  Administered 2022-11-26 – 2022-11-27 (×4): 0.125 mg via INTRAVENOUS
  Filled 2022-11-26: qty 2
  Filled 2022-11-26: qty 0.5
  Filled 2022-11-26 (×2): qty 2

## 2022-11-26 MED ORDER — IOHEXOL 350 MG/ML SOLN
75.0000 mL | Freq: Once | INTRAVENOUS | Status: AC | PRN
  Administered 2022-11-26: 75 mL via INTRAVENOUS

## 2022-11-26 MED ORDER — SODIUM CHLORIDE 0.9% FLUSH
3.0000 mL | Freq: Two times a day (BID) | INTRAVENOUS | Status: DC
  Administered 2022-11-26 – 2022-12-04 (×15): 3 mL via INTRAVENOUS

## 2022-11-26 MED ORDER — DILTIAZEM HCL-DEXTROSE 125-5 MG/125ML-% IV SOLN (PREMIX)
5.0000 mg/h | INTRAVENOUS | Status: DC
  Administered 2022-11-26: 5 mg/h via INTRAVENOUS
  Filled 2022-11-26: qty 125

## 2022-11-26 MED ORDER — OXYCODONE-ACETAMINOPHEN 5-325 MG PO TABS
1.0000 | ORAL_TABLET | Freq: Four times a day (QID) | ORAL | Status: DC | PRN
  Administered 2022-11-27 – 2022-12-03 (×7): 1 via ORAL
  Filled 2022-11-26 (×8): qty 1

## 2022-11-26 MED ORDER — INSULIN GLARGINE-YFGN 100 UNIT/ML ~~LOC~~ SOLN
10.0000 [IU] | Freq: Every day | SUBCUTANEOUS | Status: DC
  Administered 2022-11-26 – 2022-12-01 (×5): 10 [IU] via SUBCUTANEOUS
  Filled 2022-11-26 (×9): qty 0.1

## 2022-11-26 MED ORDER — SODIUM CHLORIDE 0.9 % IV BOLUS
1000.0000 mL | Freq: Once | INTRAVENOUS | Status: AC
Start: 2022-11-26 — End: 2022-11-26
  Administered 2022-11-26: 1000 mL via INTRAVENOUS

## 2022-11-26 MED ORDER — SODIUM CHLORIDE 0.9% FLUSH: 3.0000 mL | INTRAVENOUS | Status: DC | PRN

## 2022-11-26 MED ORDER — SODIUM CHLORIDE 0.9 % IV SOLN: 250.0000 mL | INTRAVENOUS | Status: DC | PRN

## 2022-11-26 MED ORDER — LATANOPROST 0.005 % OP SOLN
1.0000 [drp] | Freq: Every day | OPHTHALMIC | Status: DC
  Administered 2022-11-27 – 2022-12-03 (×8): 1 [drp] via OPHTHALMIC
  Filled 2022-11-26: qty 2.5

## 2022-11-26 MED ORDER — PRAVASTATIN SODIUM 40 MG PO TABS
40.0000 mg | ORAL_TABLET | Freq: Every day | ORAL | Status: DC
  Administered 2022-11-27 – 2022-12-03 (×7): 40 mg via ORAL
  Filled 2022-11-26 (×8): qty 1

## 2022-11-26 MED ORDER — ONDANSETRON HCL 4 MG/2ML IJ SOLN: 4.0000 mg | Freq: Four times a day (QID) | INTRAMUSCULAR | Status: DC | PRN

## 2022-11-26 MED ORDER — GABAPENTIN 100 MG PO CAPS
100.0000 mg | ORAL_CAPSULE | Freq: Every day | ORAL | Status: DC
  Administered 2022-11-26 – 2022-12-03 (×8): 100 mg via ORAL
  Filled 2022-11-26 (×8): qty 1

## 2022-11-26 MED ORDER — LABETALOL HCL 5 MG/ML IV SOLN: 10.0000 mg | INTRAVENOUS | Status: DC | PRN

## 2022-11-26 MED ORDER — NORTRIPTYLINE HCL 10 MG PO CAPS
10.0000 mg | ORAL_CAPSULE | Freq: Every day | ORAL | Status: DC
  Administered 2022-11-26 – 2022-12-03 (×8): 10 mg via ORAL
  Filled 2022-11-26 (×9): qty 1

## 2022-11-26 MED ORDER — METOPROLOL TARTRATE 50 MG PO TABS
50.0000 mg | ORAL_TABLET | Freq: Two times a day (BID) | ORAL | Status: AC
  Administered 2022-11-26 – 2022-11-28 (×5): 50 mg via ORAL
  Filled 2022-11-26 (×3): qty 1
  Filled 2022-11-26 (×2): qty 2

## 2022-11-26 MED ORDER — ONDANSETRON HCL 4 MG/2ML IJ SOLN
4.0000 mg | Freq: Once | INTRAMUSCULAR | Status: AC
  Administered 2022-11-26: 4 mg via INTRAVENOUS
  Filled 2022-11-26: qty 2

## 2022-11-26 MED ORDER — HEPARIN BOLUS VIA INFUSION
4000.0000 [IU] | Freq: Once | INTRAVENOUS | Status: AC
  Administered 2022-11-26: 4000 [IU] via INTRAVENOUS
  Filled 2022-11-26: qty 4000

## 2022-11-26 MED ORDER — INSULIN ASPART 100 UNIT/ML IJ SOLN
0.0000 [IU] | Freq: Three times a day (TID) | INTRAMUSCULAR | Status: DC
  Administered 2022-11-26: 15 [IU] via SUBCUTANEOUS
  Administered 2022-11-27 (×2): 4 [IU] via SUBCUTANEOUS
  Administered 2022-11-27: 7 [IU] via SUBCUTANEOUS
  Administered 2022-11-28 – 2022-11-30 (×6): 4 [IU] via SUBCUTANEOUS
  Administered 2022-12-01: 3 [IU] via SUBCUTANEOUS
  Administered 2022-12-01 – 2022-12-02 (×3): 4 [IU] via SUBCUTANEOUS
  Administered 2022-12-02: 7 [IU] via SUBCUTANEOUS
  Administered 2022-12-02 – 2022-12-03 (×3): 4 [IU] via SUBCUTANEOUS
  Administered 2022-12-03: 3 [IU] via SUBCUTANEOUS
  Administered 2022-12-04: 7 [IU] via SUBCUTANEOUS
  Administered 2022-12-04: 4 [IU] via SUBCUTANEOUS

## 2022-11-26 MED ORDER — HYDROCHLOROTHIAZIDE 25 MG PO TABS: 25.0000 mg | ORAL_TABLET | Freq: Every day | ORAL | Status: DC

## 2022-11-26 MED ORDER — INSULIN ASPART 100 UNIT/ML IJ SOLN
0.0000 [IU] | Freq: Every day | INTRAMUSCULAR | Status: DC
  Administered 2022-11-30 – 2022-12-03 (×2): 2 [IU] via SUBCUTANEOUS

## 2022-11-26 MED ORDER — HEPARIN (PORCINE) 25000 UT/250ML-% IV SOLN
1300.0000 [IU]/h | INTRAVENOUS | Status: DC
  Administered 2022-11-26: 1300 [IU]/h via INTRAVENOUS
  Filled 2022-11-26: qty 250

## 2022-11-26 MED ORDER — DILTIAZEM LOAD VIA INFUSION
20.0000 mg | Freq: Once | INTRAVENOUS | Status: AC
  Administered 2022-11-26: 20 mg via INTRAVENOUS
  Filled 2022-11-26: qty 20

## 2022-11-26 MED ORDER — TRAMADOL HCL 50 MG PO TABS
50.0000 mg | ORAL_TABLET | Freq: Two times a day (BID) | ORAL | Status: DC | PRN
  Filled 2022-11-26: qty 1

## 2022-11-26 MED ORDER — METOPROLOL TARTRATE 5 MG/5ML IV SOLN
2.5000 mg | Freq: Four times a day (QID) | INTRAVENOUS | Status: DC | PRN
  Administered 2022-11-26: 2.5 mg via INTRAVENOUS
  Filled 2022-11-26: qty 5

## 2022-11-26 MED ORDER — LOSARTAN POTASSIUM 50 MG PO TABS: 100.0000 mg | ORAL_TABLET | Freq: Every day | ORAL | Status: DC

## 2022-11-26 MED ORDER — ACETAMINOPHEN 325 MG PO TABS: 650.0000 mg | ORAL_TABLET | ORAL | Status: DC | PRN

## 2022-11-26 NOTE — ED Provider Notes (Signed)
[facility] EMERGENCY DEPARTMENT Provider Note   CSN: [id number] Arrival date & time: 11/26/22  1233     History  Chief Complaint  Patient presents with   Shortness of Breath   Loss of Consciousness   Covid +    [name] is a 72 y.o. female.  Pt is a 72 yo female with a pmhx significant for DM, SAH, Right eye blindness, IBS, glaucoma, and GERD.  Pt has had uri sx for about a week.  She tested herself yesterday and was + for Covid.  She has noticed that her HR has been going up for the past week.  She said she has a pulse ox at home and it's been telling her that her HR was elevated.  Today, pt felt really weak.  EMS was called.  Pt did have a syncopal event with EMS.  She was also noted to be hypoxic with O2 sats on RA in the upper 80s.  Pt put on 4L oxygen by EMS and sats are up to the mid-90s.  Pt did get 500 cc NS en route by EMS.  EMS also said pt in afib.  Pt does not have a hx of afib.       Home Medications Prior to Admission medications   Medication Sig Start Date End Date Taking? Authorizing Provider  alendronate (FOSAMAX) 70 MG tablet Take 70 mg by mouth once a week. 04/12/19   [provider]  calcium-vitamin D 250-100 MG-UNIT per tablet Take 1 tablet by mouth 2 (two) times daily.    [provider]  celecoxib (CELEBREX) 100 MG capsule TAKE 1 CAPSULE BY MOUTH TWICE DAILY AS NEEDED 06/02/21   Hilts, Casimiro Needle, MD  Cholecalciferol (VITAMIN D) 125 MCG (5000 UT) CAPS Take 5,000 Units by mouth daily.    [provider]  Coenzyme Q10 (CO Q 10) 100 MG CAPS Take 100 mg by mouth daily.    [provider]  Docusate Calcium (STOOL SOFTENER PO) Take by mouth daily.    [provider]  fish oil-omega-3 fatty acids 1000 MG capsule Take 2 g by mouth daily.    [provider]  furosemide (LASIX) 20 MG tablet Take 20 mg by mouth 2 (two) times a week. 01/17/21   [provider]  gabapentin (NEURONTIN) 100 MG  capsule Take 100 mg by mouth at bedtime. 05/14/17   [provider]  glucosamine-chondroitin 500-400 MG tablet Take 1 tablet by mouth 3 (three) times daily.    [provider]  hydrochlorothiazide (HYDRODIURIL) 25 MG tablet TAKE 1 TABLET BY MOUTH ONCE DAILY (TAKE WITH LOSARTAN) 02/21/19   [provider]  insulin glargine (LANTUS) 100 UNIT/ML injection Inject 10 Units into the skin at bedtime.    [provider]  insulin lispro (HUMALOG) 100 UNIT/ML injection Inject into the skin 3 (three) times daily before meals.    [provider]  latanoprost (XALATAN) 0.005 % ophthalmic solution 1 drop at bedtime.    [provider]  losartan (COZAAR) 100 MG tablet Take 100 mg by mouth daily. 02/21/19   [provider]  metoprolol tartrate (LOPRESSOR) 25 MG tablet Take 50 mg by mouth 2 (two) times daily.    [provider]  Multiple Vitamin (MULTIVITAMIN) tablet Take 1 tablet by mouth daily.    [provider]  MYRBETRIQ 50 MG TB24 tablet Take 50 mg by mouth daily. 06/16/22   [provider]  nortriptyline (PAMELOR) 10 MG capsule  11/22/17   [provider]  ondansetron (ZOFRAN-ODT) 4 MG disintegrating tablet Take 1 tablet (4 mg total) by mouth every 8 (eight) hours as needed for nausea or vomiting. 07/07/22   Davonna Belling, MD  [facility] ULTRA test strip 1 each 4 (four) times daily. 01/25/21   [provider]  oxyCODONE-acetaminophen (PERCOCET/ROXICET) 5-325 MG tablet Take 1 tablet by mouth every 6 (six) hours as needed for severe pain. 07/07/22   Davonna Belling, MD  pravastatin (PRAVACHOL) 40 MG tablet Take 40 mg by mouth daily.    [provider]  Probiotic Product (PROBIOTIC PO) Take by mouth daily.    [provider]  RELION INSULIN SYR 0.3ML/31G 31G X 5/16" 0.3 ML MISC USE 4 SYRINGES DAILY 12/19/20   [provider]  traMADol (ULTRAM) 50 MG tablet Take 50 mg by mouth 2 (two) times  daily as needed. 06/05/22   [provider]  vitamin C (ASCORBIC ACID) 500 MG tablet Take 500 mg by mouth daily.    [provider]      Allergies    Codeine and Tape    Review of Systems   Review of Systems  Respiratory:  Positive for cough and shortness of breath.   Cardiovascular:  Positive for palpitations.  Neurological:  Positive for weakness.  All other systems reviewed and are negative.   Physical Exam Updated Vital Signs BP (!) 102/52   Pulse (!) 53   Temp (!) 97.5 F (36.4 C) (Axillary)   Resp 18   SpO2 96%  Physical Exam Vitals and nursing note reviewed.  Constitutional:      Appearance: She is well-developed. She is obese. She is ill-appearing.  HENT:     Head: Normocephalic and atraumatic.     Mouth/Throat:     Mouth: Mucous membranes are moist.     Pharynx: Oropharynx is clear.  Eyes:     Extraocular Movements: Extraocular movements intact.     Comments: Right eye pupil dilated and blind (chronic)  Cardiovascular:     Rate and Rhythm: Tachycardia present. Rhythm irregular.  Pulmonary:     Effort: Tachypnea present.  Abdominal:     General: Bowel sounds are normal.     Palpations: Abdomen is soft.  Musculoskeletal:        General: Normal range of motion.  Skin:    General: Skin is warm.     Capillary Refill: Capillary refill takes less than 2 seconds.  Neurological:     General: No focal deficit present.     Mental Status: She is alert and oriented to person, place, and time.  Psychiatric:        Mood and Affect: Mood normal.        Behavior: Behavior normal.     ED Results / Procedures / Treatments   Labs (all labs ordered are listed, but only abnormal results are displayed) Labs Reviewed  BASIC METABOLIC PANEL - Abnormal; Notable for the following components:      Result Value   CO2 20 (*)    Glucose, Bld 285 (*)    Creatinine, Ser 1.13 (*)    GFR, Estimated 51 (*)    All other components within normal limits  CBC WITH  DIFFERENTIAL/PLATELET - Abnormal; Notable for the following components:   RBC 5.63 (*)    Hemoglobin 15.1 (*)    HCT 51.0 (*)    MCHC 29.6 (*)    RDW 16.4 (*)    All other components within normal  limits  BRAIN NATRIURETIC PEPTIDE - Abnormal; Notable for the following components:   B Natriuretic Peptide 548.1 (*)    All other components within normal limits  RESP PANEL BY RT-PCR (RSV, FLU A&B, COVID)  RVPGX2  CULTURE, BLOOD (ROUTINE X 2)  CULTURE, BLOOD (ROUTINE X 2)  TSH  MAGNESIUM  TROPONIN I (HIGH SENSITIVITY)  TROPONIN I (HIGH SENSITIVITY)    EKG EKG Interpretation  Date/Time:  Thursday November 26 2022 12:53:18 EST Ventricular Rate:  155 PR Interval:    QRS Duration: 132 QT Interval:  309 QTC Calculation: 497 R Axis:   151 Text Interpretation: Wide-QRS tachycardia Right bundle branch block Artifact in lead(s) I III aVR aVL aVF now in afib with rvr Confirmed by Isla Pence [phone]) on 11/26/2022 1:12:42 PM  Radiology CT Angio Chest PE W and/or Wo Contrast  Result Date: 11/26/2022 CLINICAL DATA:  Shortness of breath. EXAM: CT ANGIOGRAPHY CHEST WITH CONTRAST TECHNIQUE: Multidetector CT imaging of the chest was performed using the standard protocol during bolus administration of intravenous contrast. Multiplanar CT image reconstructions and MIPs were obtained to evaluate the vascular anatomy. RADIATION DOSE REDUCTION: This exam was performed according to the departmental dose-optimization program which includes automated exposure control, adjustment of the mA and/or kV according to patient size and/or use of iterative reconstruction technique. CONTRAST:  34mL OMNIPAQUE IOHEXOL 350 MG/ML SOLN COMPARISON:  None Available. FINDINGS: Cardiovascular: Satisfactory opacification of the pulmonary arteries to the segmental level. No evidence of pulmonary embolism. Normal heart size. No pericardial effusion. Coronary artery calcifications are noted. Mediastinum/Nodes: No enlarged  mediastinal, hilar, or axillary lymph nodes. Thyroid gland, trachea, and esophagus demonstrate no significant findings. Lungs/Pleura: No pneumothorax is noted. Moderate size right pleural effusion is noted with associated atelectasis of the right lower lobe. Minimal left pleural effusion is noted with adjacent subsegmental atelectasis. Upper Abdomen: No acute abnormality. Musculoskeletal: No chest wall abnormality. No acute or significant osseous findings. Review of the MIP images confirms the above findings. IMPRESSION: No definite evidence of pulmonary embolus is noted. Moderate right pleural effusion is noted with associated atelectasis of right lower lobe. Minimal left pleural effusion is noted with adjacent subsegmental atelectasis. Coronary artery calcifications are noted suggesting coronary artery disease. Aortic Atherosclerosis (ICD10-I70.0). Electronically Signed   By: Marijo Conception M.D.   On: 11/26/2022 14:49   DG Chest Port 1 View  Result Date: 11/26/2022 CLINICAL DATA:  Shortness of breath. EXAM: PORTABLE CHEST 1 VIEW COMPARISON:  Mar 22, 2007. FINDINGS: Mild cardiomegaly is noted. Moderate right basilar opacity is noted concerning for pneumonia or atelectasis with associated pleural effusion. Minimal left basilar subsegmental atelectasis is noted. Postsurgical changes are noted in right humerus. IMPRESSION: Moderate right basilar opacity concerning for pneumonia or atelectasis with associated pleural effusion. Electronically Signed   By: Marijo Conception M.D.   On: 11/26/2022 13:34    Procedures Procedures    Medications Ordered in ED Medications  diltiazem (CARDIZEM) 1 mg/mL load via infusion 20 mg (20 mg Intravenous Bolus from Bag 11/26/22 1326)    And  diltiazem (CARDIZEM) 125 mg in dextrose 5% 125 mL (1 mg/mL) infusion (5 mg/hr Intravenous New Bag/Given 11/26/22 1325)  sodium chloride 0.9 % bolus 1,000 mL (1,000 mLs Intravenous New Bag/Given 11/26/22 1314)  ondansetron (ZOFRAN)  injection 4 mg (4 mg Intravenous Given 11/26/22 1313)  iohexol (OMNIPAQUE) 350 MG/ML injection 75 mL (75 mLs Intravenous Contrast Given 11/26/22 1436)    ED Course/ Medical Decision Making/ A&P  Medical Decision Making Amount and/or Complexity of Data Reviewed Labs: ordered. Radiology: ordered.  Risk Prescription drug management. Decision regarding hospitalization.   This patient presents to the ED for concern of sob, this involves an extensive number of treatment options, and is a complaint that carries with it a high risk of complications and morbidity.  The differential diagnosis includes afib with rvr, anemia, covid, PE,    Co morbidities that complicate the patient evaluation  DM, SAH, Right eye blindness, IBS, glaucoma, and GERD   Additional history obtained:  Additional history obtained from epic chart review External records from outside source obtained and reviewed including EMS report   Lab Tests:  I Ordered, and personally interpreted labs.  The pertinent results include:  cbc nl, bmp with glucose 285 and cr 1.13; covid/flu/rsv neg   Imaging Studies ordered:  I ordered imaging studies including cxr and ct chest  I independently visualized and interpreted imaging which showed  CXR: Moderate right basilar opacity concerning for pneumonia or  atelectasis with associated pleural effusion.  CT chest: No definite evidence of pulmonary embolus is noted.    Moderate right pleural effusion is noted with associated atelectasis  of right lower lobe. Minimal left pleural effusion is noted with  adjacent subsegmental atelectasis.    Coronary artery calcifications are noted suggesting coronary artery  disease.    Aortic Atherosclerosis (ICD10-I70.0).   I agree with the radiologist interpretation   Cardiac Monitoring:  The patient was maintained on a cardiac monitor.  I personally viewed and interpreted the cardiac monitored which showed  an underlying rhythm of: afib with rvr   Medicines ordered and prescription drug management:  I ordered medication including cardizem bolus and drip  for afib + heparin  Reevaluation of the patient after these medicines showed that the patient improved I have reviewed the patients home medicines and have made adjustments as needed   Test Considered:  ct   Critical Interventions:  cardizem   Consultations Obtained:  I requested consultation with the hospitalist (Dr. Roosevelt Locks),  and discussed lab and imaging findings as well as pertinent plan - he will admit   Problem List / ED Course:  Afib with RVR:  cardizem bolus and drip started and hr better.   Covid neg here.  ? Good specimen Pleural effusion:  ? Cause of hypoxia Hypoxia:   pt on 4L   Reevaluation:  After the interventions noted above, I reevaluated the patient and found that they have :improved   Social Determinants of Health:  Lives at home   Dispostion:  After consideration of the diagnostic results and the patients response to treatment, I feel that the patent would benefit from admission.    CRITICAL CARE Performed by: Isla Pence   Total critical care time: 30 minutes  Critical care time was exclusive of separately billable procedures and treating other patients.  Critical care was necessary to treat or prevent imminent or life-threatening deterioration.  Critical care was time spent personally by me on the following activities: development of treatment plan with patient and/or surrogate as well as nursing, discussions with consultants, evaluation of patient's response to treatment, examination of patient, obtaining history from patient or surrogate, ordering and performing treatments and interventions, ordering and review of laboratory studies, ordering and review of radiographic studies, pulse oximetry and re-evaluation of patient's condition.         Final Clinical Impression(s) / ED  Diagnoses Final diagnoses:  Atrial fibrillation with RVR (HCC)  Pleural effusion  Acute respiratory failure with hypoxia [facility])    Rx / DC Orders ED Discharge Orders     None         Isla Pence, MD 11/26/22 1521

## 2022-11-26 NOTE — ED Triage Notes (Signed)
Patient BIB ems from home with covid x1 wk, tested + last night. C/O SOB, Hypotension SBP90, HR 80- AFiB without H/O afib. Resp 20. HR 120-170. Poor intake x1wk Synodical, pale, nausea. 4liter liters placed by EMS. 20g L wrist. 598ml IVF
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
## 2022-11-27 ENCOUNTER — Inpatient Hospital Stay (HOSPITAL_COMMUNITY): Payer: Medicare Other

## 2022-11-27 ENCOUNTER — Encounter (HOSPITAL_COMMUNITY): Payer: Self-pay | Admitting: Internal Medicine

## 2022-11-27 ENCOUNTER — Other Ambulatory Visit: Payer: Self-pay

## 2022-11-27 DIAGNOSIS — E669 Obesity, unspecified: Secondary | ICD-10-CM

## 2022-11-27 DIAGNOSIS — E1169 Type 2 diabetes mellitus with other specified complication: Secondary | ICD-10-CM

## 2022-11-27 DIAGNOSIS — I480 Paroxysmal atrial fibrillation: Secondary | ICD-10-CM | POA: Diagnosis not present

## 2022-11-27 DIAGNOSIS — N179 Acute kidney failure, unspecified: Secondary | ICD-10-CM | POA: Diagnosis present

## 2022-11-27 DIAGNOSIS — J9601 Acute respiratory failure with hypoxia: Secondary | ICD-10-CM

## 2022-11-27 DIAGNOSIS — I4891 Unspecified atrial fibrillation: Secondary | ICD-10-CM

## 2022-11-27 DIAGNOSIS — I5033 Acute on chronic diastolic (congestive) heart failure: Secondary | ICD-10-CM

## 2022-11-27 DIAGNOSIS — I10 Essential (primary) hypertension: Secondary | ICD-10-CM

## 2022-11-27 DIAGNOSIS — R52 Pain, unspecified: Secondary | ICD-10-CM | POA: Diagnosis not present

## 2022-11-27 DIAGNOSIS — I5031 Acute diastolic (congestive) heart failure: Secondary | ICD-10-CM | POA: Diagnosis not present

## 2022-11-27 DIAGNOSIS — E66812 Obesity, class 2: Secondary | ICD-10-CM | POA: Diagnosis present

## 2022-11-27 DIAGNOSIS — J90 Pleural effusion, not elsewhere classified: Secondary | ICD-10-CM

## 2022-11-27 DIAGNOSIS — E785 Hyperlipidemia, unspecified: Secondary | ICD-10-CM

## 2022-11-27 HISTORY — PX: IR THORACENTESIS ASP PLEURAL SPACE W/IMG GUIDE: IMG5380

## 2022-11-27 LAB — PROTEIN, PLEURAL OR PERITONEAL FLUID: Total protein, fluid: <3 g/dL

## 2022-11-27 LAB — BASIC METABOLIC PANEL
Anion gap: 16 — ABNORMAL HIGH (ref 5–15)
Anion gap: 7 (ref 5–15)
BUN: 24 mg/dL — ABNORMAL HIGH (ref 8–23)
BUN: 24 mg/dL — ABNORMAL HIGH (ref 8–23)
CO2: 18 mmol/L — ABNORMAL LOW (ref 22–32)
CO2: 28 mmol/L (ref 22–32)
Calcium: 9 mg/dL (ref 8.9–10.3)
Calcium: 8.9 mg/dL (ref 8.9–10.3)
Chloride: 103 mmol/L (ref 98–111)
Chloride: 105 mmol/L (ref 98–111)
Creatinine, Ser: 1.23 mg/dL — ABNORMAL HIGH (ref 0.44–1.00)
Creatinine, Ser: 1.23 mg/dL — ABNORMAL HIGH (ref 0.44–1.00)
GFR, Estimated: 46 mL/min — ABNORMAL LOW (ref >60)
GFR, Estimated: 46 mL/min — ABNORMAL LOW (ref >60)
Glucose, Bld: 230 mg/dL — ABNORMAL HIGH (ref 70–99)
Glucose, Bld: 147 mg/dL — ABNORMAL HIGH (ref 70–99)
Potassium: 5.9 mmol/L — ABNORMAL HIGH (ref 3.5–5.1)
Potassium: 3.8 mmol/L (ref 3.5–5.1)
Sodium: 137 mmol/L (ref 135–145)
Sodium: 140 mmol/L (ref 135–145)

## 2022-11-27 LAB — CBC
HCT: 49.2 % — ABNORMAL HIGH (ref 36.0–46.0)
Hemoglobin: 15.1 g/dL — ABNORMAL HIGH (ref 12.0–15.0)
MCH: 27.5 pg (ref 26.0–34.0)
MCHC: 30.7 g/dL (ref 30.0–36.0)
MCV: 89.6 fL (ref 80.0–100.0)
Platelets: 154 10*3/uL (ref 150–400)
RBC: 5.49 MIL/uL — ABNORMAL HIGH (ref 3.87–5.11)
RDW: 16.5 % — ABNORMAL HIGH (ref 11.5–15.5)
WBC: 9.5 10*3/uL (ref 4.0–10.5)
nRBC: 0 % (ref 0.0–0.2)

## 2022-11-27 LAB — ECHOCARDIOGRAM COMPLETE
AR max vel: 1.91 cm2
AV Area VTI: 2.01 cm2
AV Area mean vel: 1.84 cm2
AV Mean grad: 4 mmHg
AV Peak grad: 5.9 mmHg
Ao pk vel: 1.21 m/s
Area-P 1/2: 3.48 cm2
Height: 67 in
MV VTI: 1.34 cm2
S' Lateral: 3.1 cm
Weight: 3153.46 oz

## 2022-11-27 LAB — URINALYSIS, ROUTINE W REFLEX MICROSCOPIC
Bilirubin Urine: NEGATIVE
Glucose, UA: NEGATIVE mg/dL
Ketones, ur: 5 mg/dL — AB
Nitrite: NEGATIVE
Protein, ur: 100 mg/dL — AB
Specific Gravity, Urine: 1.034 — ABNORMAL HIGH (ref 1.005–1.030)
WBC, UA: >50 WBC/hpf — ABNORMAL HIGH (ref 0–5)
pH: 5 (ref 5.0–8.0)

## 2022-11-27 LAB — GLUCOSE, PLEURAL OR PERITONEAL FLUID: Glucose, Fluid: 245 mg/dL

## 2022-11-27 LAB — GRAM STAIN: Gram Stain: NONE SEEN

## 2022-11-27 LAB — LACTATE DEHYDROGENASE, PLEURAL OR PERITONEAL FLUID: LD, Fluid: 40 U/L — ABNORMAL HIGH (ref 3–23)

## 2022-11-27 LAB — BODY FLUID CELL COUNT WITH DIFFERENTIAL
Lymphs, Fluid: 44 %
Monocyte-Macrophage-Serous Fluid: 48 % — ABNORMAL LOW (ref 50–90)
Neutrophil Count, Fluid: 8 % (ref 0–25)
Total Nucleated Cell Count, Fluid: 64 cu mm (ref 0–1000)

## 2022-11-27 LAB — HEMOGLOBIN A1C
Hgb A1c MFr Bld: 7.4 % — ABNORMAL HIGH (ref 4.8–5.6)
Mean Plasma Glucose: 165.68 mg/dL

## 2022-11-27 LAB — GLUCOSE, CAPILLARY
Glucose-Capillary: 164 mg/dL — ABNORMAL HIGH (ref 70–99)
Glucose-Capillary: 175 mg/dL — ABNORMAL HIGH (ref 70–99)

## 2022-11-27 LAB — CBG MONITORING, ED
Glucose-Capillary: 172 mg/dL — ABNORMAL HIGH (ref 70–99)
Glucose-Capillary: 230 mg/dL — ABNORMAL HIGH (ref 70–99)

## 2022-11-27 MED ORDER — SODIUM ZIRCONIUM CYCLOSILICATE 10 G PO PACK
10.0000 g | PACK | Freq: Once | ORAL | Status: AC
  Administered 2022-11-27: 10 g via ORAL
  Filled 2022-11-27: qty 1

## 2022-11-27 MED ORDER — LIDOCAINE HCL 1 % IJ SOLN
INTRAMUSCULAR | Status: AC
  Administered 2022-11-27: 8 mL
  Filled 2022-11-27: qty 20

## 2022-11-27 NOTE — Progress Notes (Signed)
Lower extremity venous duplex has been completed.   Preliminary results in CV Proc.   Genene Kilman Montrice Gracey 11/27/2022 9:08 AM

## 2022-11-27 NOTE — Progress Notes (Addendum)
Progress Note   Patient: [name] PNT:[id number] DOB: October 14, 1951 DOA: 11/26/2022     1 DOS: the patient was seen and examined on 11/27/2022   Brief hospital course: [name] was admitted to the hospital with the working diagnosis of new onset atrial fibrillation.   72 yo female with the past medical history of hypertension, T2DM, hypertension, SAH in 2007, who presented with dyspnea. Reported 3 to 4 weeks of dry cough and dyspnea on exertion. Over the last week she developed orthopnea and palpitations that prompted her to come to the ED. On her initial physical examination her blood pressure was 99/75, HR 124, RR 25 and 02 satuarion 96%, lungs with rales bilaterally, increase work of breathing, heart with S1 and S2 present irregularly irregular, with no gallops, abdomen with no distention and positive lower extremity edema.   Na 138, K 4,6 Cl 103 bicarbonate 20, glucose 265, bun 19, cr 1,13 BNP 548  Wbc 6,8 hgb 15.1 plt 184  Sars covid 19 negative  Urina analysis SG 1,034, 100 protein, >50 wbc   Chest radiograph with cardiomegaly, bilateral hilar vascular congestion, positive moderate right pleural effusion with opacity at the right base, atelectasis.   CT chest with no pulmonary embolism, positive right pleural effusion with compression atelectasis, positive bilateral ground glass opacities.   EKG 155 bpm, right axis deviation, right bundle branch block, atrial fibrillation rhythm with no significant ST segment or T wave changes.   Patient was placed on diltiazem drip and received IV digoxin load.  Right sided thoracentesis.  Diuresis with IV furosemide.    Assessment and Plan: * Acute on chronic diastolic CHF (congestive heart failure) (HCC) Echocardiogram with preserved LV systolic function EF 65 to 08%, RV systolic function with mild reduction. Mild dilatated RV cavity. Mild mitral valve stenosis.  Today with improvement in volume status Blood pressure is 676 mmHg systolic.    Plan to continue blood pressure control B blockade. Hold on RAS inhibition until renal function more stable.  Hold on diuresis for now.   Acute hypoxemic respiratory failure due to acute cardiogenic pulmonary edema Right pleural effusion Sp thoracentesis Continue oxymetry monitoring.  COVID test at home inconclusive and she had URI symptoms 2 to 3 weeks ago. Admission COVID test negative, discontinue droplet precautions.   A-fib [facility]) Patient continue in atrial fibrillation. Rate is better controlled.   Continue with metoprolol. 50 mg po bid. Hold on digoxin for now Patient has history of intracranial bleed or aneurysm, will need to clarify risk of bleeding before starting on anticoagulation.    AKI (acute kidney injury) ([place]) Hyperkalemia. Renal function with serum cr at 1,23 with K at 5,9 and serum bicarbonate at 18.  Na 137 and Cl 103  Plan to stop diuretic therapy. Add one dose of sodium zirconium and follow up BMP now.  Continue telemetry monitoring.   Essential hypertension Continue blood pressure control with metoprolol.  Will add RAS inhibition when renal function more stable and improves K   Type 2 diabetes mellitus with hyperlipidemia (HCC) Continue insulin sliding scale for glucose cover and monitoring Basal insulin 10 units.  Continue with statin therapy.   Class 2 obesity Calculated BMI is 38,1  Ambulatory dysfunction with bilateral knee osteoarthritis.         Subjective: Patient is feeling better, no chest pain and dyspnea has improved.    Physical Exam: Vitals:   11/27/22 1452 11/27/22 1458 11/27/22 1632 11/27/22 1713  BP: 134/63  139/67 132/60  Pulse:  97  100 (!) 102  Resp: 20  20   Temp: 98 F (36.7 C)     TempSrc: Oral     SpO2: 98% 99% 95% 100%  Weight: 110.5 kg     Height: 5\' 7"  (1.702 m)      Neurology awake and alert ENT with mild pallor Cardiovascular with S1 and S2 present, irregularly irregular, with no gallops rubs or  murmurs No JVD No lower extremity edema Respiratory with no rales or wheezing Abdomen with no distention  Data Reviewed:   Family Communication: I spoke with patient's husband at the bedside, we talked in detail about patient's condition, plan of care and prognosis and all questions were addressed.   Disposition: Status is: Inpatient Remains inpatient appropriate because: cardiac monitoring   Planned Discharge Destination: Home      Author: Tawni Millers, MD 11/27/2022 6:38 PM  For on call review [email or web address].

## 2022-11-27 NOTE — Progress Notes (Signed)
Pt called out for assistance to [facility]. Tech assisted pt from bed to stand at bedside. Once pt tried to pivot to [facility], her right knee "buckled" and pt was guided onto [facility] with max assist from tech. Pt was unable to use stedy due to extreme pain in right shoulder, which is chronic per pt. Pt assisted back to bed with help from several staff members after using BSC. Per pt and husband, pt has trouble walking due to knee pain. At baseline, pt is only able to go very short distances with the use of a walker. PT eval is already in place.
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
## 2022-11-27 NOTE — Assessment & Plan Note (Signed)
Continue blood pressure control with metoprolol.  Will add RAS inhibition when renal function more stable and improves K

## 2022-11-27 NOTE — Progress Notes (Signed)
Pt arrived to 4e from [facility] ED. Pt oriented to room and staff. Vitals obtained. Tele applied and CCMD notified x2 verifiers. CHG bath done. Husband at bedside.

## 2022-11-27 NOTE — Assessment & Plan Note (Signed)
Calculated BMI is 38,1  Ambulatory dysfunction with bilateral knee osteoarthritis.   Right shoulder osteoarthritis. Will check radiographs. Continue pain control and outpatient follow up with orthopedics.

## 2022-11-27 NOTE — ED Notes (Signed)
Pt transported to IR 

## 2022-11-27 NOTE — Assessment & Plan Note (Addendum)
Hyperkalemia.  Serum ct is 0,72 with K at 3,9 and serum bicarbonate at 30. Plan to continue diuretic therapy and follow up renal function and electrolytes in am

## 2022-11-27 NOTE — ED Notes (Signed)
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 Heart is enlarged but stable in configuration. There is atelectasis in the LEFT LOWER lobe. No pulmonary edema. Remote postoperative and degenerative changes in the RIGHT shoulder. IMPRESSION: 1. Interval RIGHT thoracentesis. No pneumothorax. 2. Stable cardiomegaly. Electronically Signed   By: Norva Pavlov M.D.   On: 11/27/2022 10:22   CT Angio Chest PE W and/or Wo Contrast  Result Date: 11/26/2022 CLINICAL DATA:  Shortness of breath. EXAM: CT ANGIOGRAPHY CHEST WITH  CONTRAST TECHNIQUE: Multidetector CT imaging of the chest was performed using the standard protocol during bolus administration of intravenous contrast. Multiplanar CT image reconstructions and MIPs were obtained to evaluate the vascular anatomy. RADIATION DOSE REDUCTION: This exam was performed according to the departmental dose-optimization program which includes automated exposure control, adjustment of the mA and/or kV according to patient size and/or use of iterative reconstruction technique. CONTRAST:  80mL OMNIPAQUE IOHEXOL 350 MG/ML SOLN COMPARISON:  None Available. FINDINGS: Cardiovascular: Satisfactory opacification of the pulmonary arteries to the segmental level. No evidence of pulmonary embolism. Normal heart size. No pericardial effusion. Coronary artery calcifications are noted. Mediastinum/Nodes: No enlarged mediastinal, hilar, or axillary lymph nodes. Thyroid gland, trachea, and esophagus demonstrate no significant findings. Lungs/Pleura: No pneumothorax is noted. Moderate size right pleural effusion is noted with associated atelectasis of the right lower lobe. Minimal left pleural effusion is noted with adjacent subsegmental atelectasis. Upper Abdomen: No acute abnormality. Musculoskeletal: No chest wall abnormality. No acute or significant osseous findings. Review of the MIP images confirms the above findings. IMPRESSION: No definite evidence of pulmonary embolus is noted. Moderate right pleural effusion is noted with associated atelectasis of right lower lobe. Minimal left pleural effusion is noted with adjacent subsegmental atelectasis. Coronary artery calcifications are noted suggesting coronary artery disease. Aortic Atherosclerosis (ICD10-I70.0). Electronically Signed   By: Lupita Raider M.D.   On: 11/26/2022 14:49   DG Chest Port 1 View  Result Date: 11/26/2022 CLINICAL DATA:  Shortness of breath. EXAM: PORTABLE CHEST 1 VIEW COMPARISON:  Mar 22, 2007. FINDINGS: Mild cardiomegaly is  noted. Moderate right basilar opacity is noted concerning for pneumonia or atelectasis with associated pleural effusion. Minimal left basilar subsegmental atelectasis is noted. Postsurgical changes are noted in right humerus. IMPRESSION: Moderate right basilar opacity concerning for pneumonia or atelectasis with associated pleural effusion. Electronically Signed   By: Lupita Raider M.D.   On: 11/26/2022 13:34    Pending Labs Unresulted Labs (From admission, onward)     Start     Ordered   11/27/22 1017  Culture, body fluid w Gram Stain-bottle  Once,   R        11/27/22 1017   11/27/22 1017  Gram stain  Once,   R        11/27/22 1017   11/27/22 1016  Miscellaneous [facility] test (send-out)  Once,   R        11/27/22 1016   11/27/22 0500  CBC  Daily,   R      11/26/22 1538   11/27/22  1610  Basic metabolic panel  Daily,   R     Comments: As Scheduled for 5 days    11/26/22 1634   11/26/22 1305  Culture, blood (routine x 2)  BLOOD CULTURE X 2,   R      11/26/22 1304            Vitals/Pain Today's Vitals   11/27/22 0930 11/27/22 1056 11/27/22 1115 11/27/22 1243  BP: (!) 142/70 137/80 130/72   Pulse: 96 (!) 105 98   Resp: (!) 25  20   Temp:    (!) 97.5 F (36.4 C)  TempSrc:    Oral  SpO2: 99%  100%   Weight:      Height:      PainSc:        Isolation Precautions Airborne and Contact precautions  Medications Medications  digoxin (LANOXIN) 0.25 MG/ML injection 0.125 mg (0.125 mg Intravenous Given 11/27/22 0801)  metoprolol tartrate (LOPRESSOR) injection 2.5 mg (2.5 mg Intravenous Given 11/26/22 1836)  oxyCODONE-acetaminophen (PERCOCET/ROXICET) 5-325 MG per tablet 1 tablet (has no administration in time range)  traMADol (ULTRAM) tablet 50 mg (has no administration in time range)  metoprolol tartrate (LOPRESSOR) tablet 50 mg (50 mg Oral Given 11/27/22 1056)  pravastatin (PRAVACHOL) tablet 40 mg (40 mg Oral Given 11/27/22 1056)  nortriptyline (PAMELOR) capsule 10 mg (10 mg Oral  Given 11/26/22 2307)  insulin glargine-yfgn (SEMGLEE) injection 10 Units (10 Units Subcutaneous Given 11/27/22 1241)  mirabegron ER (MYRBETRIQ) tablet 50 mg (0 mg Oral Hold 11/27/22 1058)  gabapentin (NEURONTIN) capsule 100 mg (100 mg Oral Given 11/26/22 2306)  latanoprost (XALATAN) 0.005 % ophthalmic solution 1 drop (1 drop Left Eye Given 11/27/22 0020)  sodium chloride flush (NS) 0.9 % injection 3 mL (3 mLs Intravenous Given 11/27/22 1058)  sodium chloride flush (NS) 0.9 % injection 3 mL (has no administration in time range)  0.9 %  sodium chloride infusion (has no administration in time range)  acetaminophen (TYLENOL) tablet 650 mg (has no administration in time range)  ondansetron (ZOFRAN) injection 4 mg (has no administration in time range)  insulin aspart (novoLOG) injection 0-20 Units (7 Units Subcutaneous Given 11/27/22 1240)  insulin aspart (novoLOG) injection 0-5 Units ( Subcutaneous Not Given 11/27/22 0245)  enoxaparin (LOVENOX) injection 40 mg (40 mg Subcutaneous Given 11/26/22 2306)  diltiazem (CARDIZEM) 1 mg/mL load via infusion 20 mg (20 mg Intravenous Bolus from Bag 11/26/22 1326)  sodium chloride 0.9 % bolus 1,000 mL (0 mLs Intravenous Stopped 11/26/22 1625)  ondansetron (ZOFRAN) injection 4 mg (4 mg Intravenous Given 11/26/22 1313)  iohexol (OMNIPAQUE) 350 MG/ML injection 75 mL (75 mLs Intravenous Contrast Given 11/26/22 1436)  heparin bolus via infusion 4,000 Units (4,000 Units Intravenous Bolus from Bag 11/26/22 1616)  lidocaine (XYLOCAINE) 1 % (with pres) injection (8 mLs  Given 11/27/22 0958)    Mobility walks with device Moderate fall risk   Focused Assessments Cardiac Assessment Handoff:  Cardiac Rhythm: Atrial fibrillation No results found for: "CKTOTAL", "CKMB", "CKMBINDEX", "TROPONINI" No results found for: "DDIMER" Does the Patient currently have chest pain? No   Cardiac Rhythm: Atrial fibrillation           If patient is a Neuro Trauma and patient is going to OR  before floor call report to [place] nurse: [phone] or [phone]  , Pulmonary Assessment Handoff:  Lung sounds: Bilateral Breath Sounds: Clear O2 Device: Nasal Cannula O2 Flow Rate (L/min): 2 L/min    R Recommendations: See Admitting  Provider Note  Report given to:   Additional Notes:

## 2022-11-27 NOTE — Assessment & Plan Note (Addendum)
Continue insulin sliding scale for glucose cover and monitoring Basal insulin 10 units. (Home dose).  Continue with statin therapy.   Glucose well controlled with fasting glucose at 111 mg/dl.

## 2022-11-27 NOTE — Progress Notes (Signed)
  Echocardiogram 2D Echocardiogram has been performed.  [name] M 11/27/2022, 9:32 AM

## 2022-11-27 NOTE — Assessment & Plan Note (Signed)
Patient continue in atrial fibrillation. Rate is better controlled.   Continue with metoprolol. 50 mg po bid. Hold on digoxin for now Patient has history of intracranial bleed or aneurysm, will need to clarify risk of bleeding before starting on anticoagulation.

## 2022-11-27 NOTE — ED Notes (Signed)
Patient in and out cathed at this time. UA collected and sent to lab

## 2022-11-27 NOTE — Assessment & Plan Note (Addendum)
Echocardiogram with preserved LV systolic function EF 65 to 38%, RV systolic function with mild reduction. Mild dilatated RV cavity. Mild mitral valve stenosis.  Urine output is 1,400 cc Since admission volume balance negative 8,369 ml.  Systolic blood pressure 453 to 160 mmHg.   Heart failure management with spironolactone, losartan, metoprolol succinate and diuresis with furosemide.  No SGLT 2 inh due to type 1 DM.   Acute hypoxemic respiratory failure due to acute cardiogenic pulmonary edema Right pleural effusion Sp thoracentesis (transudate per fluid analysis).  Continue oxymetry monitoring.  COVID test at home inconclusive and she had URI symptoms 2 to 3 weeks ago. Admission COVID test negative, discontinue droplet precautions.   Oxymetry is 94% on 1 L/min per [place].  Continue oxymetry monitoring

## 2022-11-28 DIAGNOSIS — N179 Acute kidney failure, unspecified: Secondary | ICD-10-CM | POA: Diagnosis not present

## 2022-11-28 DIAGNOSIS — I480 Paroxysmal atrial fibrillation: Secondary | ICD-10-CM | POA: Diagnosis not present

## 2022-11-28 DIAGNOSIS — I10 Essential (primary) hypertension: Secondary | ICD-10-CM | POA: Diagnosis not present

## 2022-11-28 DIAGNOSIS — I5033 Acute on chronic diastolic (congestive) heart failure: Secondary | ICD-10-CM | POA: Diagnosis not present

## 2022-11-28 LAB — BASIC METABOLIC PANEL
Anion gap: 10 (ref 5–15)
BUN: 23 mg/dL (ref 8–23)
CO2: 25 mmol/L (ref 22–32)
Calcium: 8.6 mg/dL — ABNORMAL LOW (ref 8.9–10.3)
Chloride: 104 mmol/L (ref 98–111)
Creatinine, Ser: 0.97 mg/dL (ref 0.44–1.00)
GFR, Estimated: >60 mL/min (ref >60)
Glucose, Bld: 174 mg/dL — ABNORMAL HIGH (ref 70–99)
Potassium: 4 mmol/L (ref 3.5–5.1)
Sodium: 139 mmol/L (ref 135–145)

## 2022-11-28 LAB — GLUCOSE, CAPILLARY
Glucose-Capillary: 155 mg/dL — ABNORMAL HIGH (ref 70–99)
Glucose-Capillary: 154 mg/dL — ABNORMAL HIGH (ref 70–99)
Glucose-Capillary: 181 mg/dL — ABNORMAL HIGH (ref 70–99)
Glucose-Capillary: 151 mg/dL — ABNORMAL HIGH (ref 70–99)

## 2022-11-28 MED ORDER — EMPAGLIFLOZIN 10 MG PO TABS
10.0000 mg | ORAL_TABLET | Freq: Every day | ORAL | Status: DC
  Administered 2022-11-28: 10 mg via ORAL
  Filled 2022-11-28: qty 1

## 2022-11-28 MED ORDER — METOPROLOL SUCCINATE ER 100 MG PO TB24
100.0000 mg | ORAL_TABLET | Freq: Every day | ORAL | Status: DC
  Administered 2022-11-29 – 2022-12-04 (×6): 100 mg via ORAL
  Filled 2022-11-28 (×6): qty 1

## 2022-11-28 NOTE — Evaluation (Signed)
Physical Therapy Evaluation Patient Details Name: [name] MRN: [id number] DOB: 01-02-1951 Today's Date: 11/28/2022  History of Present Illness  72 yo female admitted 1/11 for Acute on chronic diastolic CHF. Past medical history of hypertension, T2DM, hypertension, SAH in 2007  Clinical Impression  Pt admitted with above diagnosis. Pt reports functional decline over the past couple of months. Prior to hospitalization she was needing some assist from family to get into/out of shower. Ambulating short distances with RW by herslef in her home, but has reported several falls. Limited by bil knee and Rt shoulder deficits. Pt has had a significant decline and currently requires max assist for sit<>stand and scooting along bed. Unable to safely transfer without lift equipment. SpO2 down to 88% on RA, maintains upper 90s on 2L supplemental O2. Pt worked very hard with therapy, tolerating almost 1 hour of continuous training today. Very interested in AIR and I believe she would push herself to regain her independence back if she qualifies. Will follow and progress during admission. Pt currently with functional limitations due to the deficits listed below (see PT Problem List). Pt will benefit from skilled PT to increase their independence and safety with mobility to allow discharge to the venue listed below.          Recommendations for follow up therapy are one component of a multi-disciplinary discharge planning process, led by the attending physician.  Recommendations may be updated based on patient status, additional functional criteria and insurance authorization.  Follow Up Recommendations Acute inpatient rehab (3hours/day)      Assistance Recommended at Discharge Frequent or constant Supervision/Assistance  Patient can return home with the following  Two people to help with walking and/or transfers;Two people to help with bathing/dressing/bathroom;Assistance with cooking/housework;Assist for  transportation;Help with stairs or ramp for entrance    Equipment Recommendations  (TBD next venue of care)  Recommendations for Other Services  Rehab consult    Functional Status Assessment Patient has had a recent decline in their functional status and demonstrates the ability to make significant improvements in function in a reasonable and predictable amount of time.     Precautions / Restrictions Precautions Precautions: Fall Precaution Comments: Reports several falls Restrictions Weight Bearing Restrictions: No      Mobility  Bed Mobility Overal bed mobility: Needs Assistance Bed Mobility: Supine to Sit, Rolling, Sit to Sidelying Rolling: Min assist   Supine to sit: HOB elevated, Max assist Sit to supine: Max assist Sit to sidelying: Mod assist General bed mobility comments: Max assist for trunk support and LEs to bring fully off EOB. Able to initiate movement but gross weakness and limited RUE use. Mod assist for LEs support back onto bed sidelying. Cues for rail use with LUE when lying down onto Rt shoulder. Able to roll with min assist, again limited by RUE pain and dysfunction.    Transfers Overall transfer level: Needs assistance Equipment used: Rolling walker (2 wheels), Ambulation equipment used Transfers: Sit to/from Stand Sit to Stand: Max assist, From elevated surface           General transfer comment: Worked on sit<>stand training from higher surface setting on bed. Requires assist for LE alignment (difficulty flexing BIL knees.) Use RW, max assist, able to reposition LLE under BOS but kept RLE extended preventing full WB through both LEs. Showing significant posterior and rightward lean, feels that she is falling towards left. Attempted sit<>stand with Stedy as well, limited upright stance with this device though as her LEs have  difficulty aligning on foot plate. Progressed with mini-stand/squat and scoot transfer along the bed, using LUE to pull through back  of Stedy bar, which was well tolerated but still required max assist for boost and direction.    Ambulation/Gait                  Stairs            Wheelchair Mobility    Modified Rankin (Stroke Patients Only)       Balance Overall balance assessment: Needs assistance Sitting-balance support: Feet supported, Single extremity supported Sitting balance-Leahy Scale: Poor Sitting balance - Comments: Requires single UE support to stabilize on EOB. Falling posteriorly frequently, but gradually improved over course of session with training. Postural control: Posterior lean, Right lateral lean Standing balance support: Bilateral upper extremity supported, Reliant on assistive device for balance Standing balance-Leahy Scale: Zero                               Pertinent Vitals/Pain Pain Assessment Pain Assessment: No/denies pain    Home Living Family/patient expects to be discharged to:: Private residence Living Arrangements: Spouse/significant other;Children Available Help at Discharge: Family;Available PRN/intermittently Type of Home: House Home Access: Stairs to enter Entrance Stairs-Rails: Right Entrance Stairs-Number of Steps: 3   Home Layout: One level Home Equipment: Grab bars - tub/shower;Toilet riser;Rolling Walker (2 wheels);Rollator (4 wheels);Shower seat      Prior Function Prior Level of Function : Needs assist;History of Falls (last six months)             Mobility Comments: Uses RW for short distance mobility in the house. No O2 at baseline. Reports difficulty with mobility due to knee issues. Hx Rt TKA. ADLs Comments: Uses sock-puller to get dressed, family helps with shoes, can dress herself otherwise. Family has been helping getting in/out of shower for past 2 months.     Hand Dominance   Dominant Hand: Right    Extremity/Trunk Assessment   Upper Extremity Assessment Upper Extremity Assessment: Defer to OT evaluation     Lower Extremity Assessment Lower Extremity Assessment: Generalized weakness       Communication   Communication: No difficulties  Cognition Arousal/Alertness: Awake/alert Behavior During Therapy: WFL for tasks assessed/performed Overall Cognitive Status: Within Functional Limits for tasks assessed                                          General Comments General comments (skin integrity, edema, etc.): At rest: HR 97, BP 131/90, SpO2 98% on 2L. Removed O2 EOB, dropped to 88-89%, improves to upper 90s quickly after reapplying.    Exercises     Assessment/Plan    PT Assessment Patient needs continued PT services  PT Problem List Decreased strength;Decreased range of motion;Decreased activity tolerance;Decreased balance;Decreased mobility;Decreased knowledge of use of DME;Cardiopulmonary status limiting activity;Impaired sensation;Obesity       PT Treatment Interventions DME instruction;Gait training;Functional mobility training;Therapeutic activities;Therapeutic exercise;Balance training;Neuromuscular re-education;Patient/family education;Wheelchair mobility training    PT Goals (Current goals can be found in the Care Plan section)  Acute Rehab PT Goals Patient Stated Goal: Get stronger, be independent again PT Goal Formulation: With patient Time For Goal Achievement: 12/12/22 Potential to Achieve Goals: Good    Frequency Min 4X/week     Co-evaluation  AM-PAC PT "6 Clicks" Mobility  Outcome Measure Help needed turning from your back to your side while in a flat bed without using bedrails?: A Little Help needed moving from lying on your back to sitting on the side of a flat bed without using bedrails?: A Lot Help needed moving to and from a bed to a chair (including a wheelchair)?: Total Help needed standing up from a chair using your arms (e.g., wheelchair or bedside chair)?: Total Help needed to walk in hospital room?: Total Help  needed climbing 3-5 steps with a railing? : Total 6 Click Score: 9    End of Session Equipment Utilized During Treatment: Gait belt;Oxygen Activity Tolerance: Patient tolerated treatment well Patient left: in bed;with call bell/phone within reach;with bed alarm set Nurse Communication: Mobility status;Need for lift equipment PT Visit Diagnosis: Unsteadiness on feet (R26.81);Repeated falls (R29.6);Muscle weakness (generalized) (M62.81);History of falling (Z91.81);Difficulty in walking, not elsewhere classified (R26.2)    Time: 1121-1220 PT Time Calculation (min) (ACUTE ONLY): 59 min   Charges:   PT Evaluation $PT Eval Moderate Complexity: 1 Mod PT Treatments $Therapeutic Activity: 23-37 mins $Neuromuscular Re-education: 8-22 mins        Candie Mile, PT, DPT Physical Therapist Acute [facility] [place]   Ellouise Newer 11/28/2022, 12:55 PM

## 2022-11-28 NOTE — Progress Notes (Addendum)
Progress Note   Patient: [name] XIP:[id number] DOB: 1951-08-22 DOA: 11/26/2022     2 DOS: the patient was seen and examined on 11/28/2022   Brief hospital course: Mrs. Baldassari was admitted to the hospital with the working diagnosis of new onset atrial fibrillation.   72 yo female with the past medical history of hypertension, T2DM, hypertension, SAH in 2007, who presented with dyspnea. Reported 3 to 4 weeks of dry cough and dyspnea on exertion. Over the last week she developed orthopnea and palpitations that prompted her to come to the ED. On her initial physical examination her blood pressure was 99/75, HR 124, RR 25 and 02 satuarion 96%, lungs with rales bilaterally, increase work of breathing, heart with S1 and S2 present irregularly irregular, with no gallops, abdomen with no distention and positive lower extremity edema.   Na 138, K 4,6 Cl 103 bicarbonate 20, glucose 265, bun 19, cr 1,13 BNP 548  Wbc 6,8 hgb 15.1 plt 184  Sars covid 19 negative  Urina analysis SG 1,034, 100 protein, >50 wbc   Chest radiograph with cardiomegaly, bilateral hilar vascular congestion, positive moderate right pleural effusion with opacity at the right base, atelectasis.   CT chest with no pulmonary embolism, positive right pleural effusion with compression atelectasis, positive bilateral ground glass opacities.   EKG 155 bpm, right axis deviation, right bundle branch block, atrial fibrillation rhythm with no significant ST segment or T wave changes.   Patient was placed on diltiazem drip and received IV digoxin load.  Right sided thoracentesis.  Diuresis with IV furosemide.   01/13 atrial fibrillation with rate controlled.    Assessment and Plan: * Acute on chronic diastolic CHF (congestive heart failure) (HCC) Echocardiogram with preserved LV systolic function EF 65 to 67%, RV systolic function with mild reduction. Mild dilatated RV cavity. Mild mitral valve stenosis.  Clinically euvolemic today.    Plan to continue blood pressure control B blockade. Hold on RAS inhibition until renal function more stable.  Hold on diuresis for now.   Acute hypoxemic respiratory failure due to acute cardiogenic pulmonary edema Right pleural effusion Sp thoracentesis (transudate per fluid analysis).  Continue oxymetry monitoring.  COVID test at home inconclusive and she had URI symptoms 2 to 3 weeks ago. Admission COVID test negative, discontinue droplet precautions.   Wean off supplemental 02 as tolerated.  no SGLT 2 inh due T1DM.  Plan to add RAS inhibition if blood pressure stable.   A-fib [facility]) Patient continue in atrial fibrillation with rate control 80 to 90 bpm. Patient tolerating well  metoprolol, will transition to long acting 100 mg metoprolol succinate.   Old records have been reviewed, neurology visit in 2013 reported history of perimesencephalic subarachnoid hemorrhage.  Will hold on anticoagulation for now.    AKI (acute kidney injury) ([place]) Hyperkalemia.  Patient with improvement in renal function with serum cr at 0,97,K is 4,0 and serum bicarbonate at 25.  K corrected after sodium zirconium.   Essential hypertension Continue blood pressure control with metoprolol.  At home patient is on losartan, will resume in the next 24 hrs.    Type 1 diabetes mellitus on insulin therapy (HCC) Continue insulin sliding scale for glucose cover and monitoring Basal insulin 10 units. (Home dose).  Continue with statin therapy.   Class 2 obesity Calculated BMI is 38,1  Ambulatory dysfunction with bilateral knee osteoarthritis.         Subjective: Patient is feeling better, no chest pain or palpitations.  Physical Exam: Vitals:   11/27/22 2100 11/27/22 2330 11/28/22 0417 11/28/22 0729  BP: 134/65 (!) 108/51 132/69 (!) 150/87  Pulse: 99 85 100 94  Resp: 20 17 12 16   Temp: 98.1 F (36.7 C) 98 F (36.7 C) 98.2 F (36.8 C) 97.9 F (36.6 C)  TempSrc: Oral Oral Oral Oral   SpO2: 99% 96% 99% 98%  Weight:   105.7 kg   Height:       Neurology awake and alert ENT with no pallor Cardiovascular with S1 and S2 present, irregularly irregular with no gallops, rubs or murmurs No JVD No lower extremity edema, (discoloration and hypertrophy at the knees) Respiratory with no rales or wheezing Abdomen with no distention   Data Reviewed:    Family Communication: no family at the bedside   Disposition: Status is: Inpatient Remains inpatient appropriate because: cardiac monitoring   Planned Discharge Destination: Home   Author: Tawni Millers, MD 11/28/2022 10:52 AM  For on call review [email or web address].

## 2022-11-29 DIAGNOSIS — I5033 Acute on chronic diastolic (congestive) heart failure: Secondary | ICD-10-CM | POA: Diagnosis not present

## 2022-11-29 DIAGNOSIS — I480 Paroxysmal atrial fibrillation: Secondary | ICD-10-CM | POA: Diagnosis not present

## 2022-11-29 DIAGNOSIS — I10 Essential (primary) hypertension: Secondary | ICD-10-CM | POA: Diagnosis not present

## 2022-11-29 DIAGNOSIS — E109 Type 1 diabetes mellitus without complications: Secondary | ICD-10-CM

## 2022-11-29 LAB — GLUCOSE, CAPILLARY
Glucose-Capillary: 68 mg/dL — ABNORMAL LOW (ref 70–99)
Glucose-Capillary: 64 mg/dL — ABNORMAL LOW (ref 70–99)
Glucose-Capillary: 115 mg/dL — ABNORMAL HIGH (ref 70–99)
Glucose-Capillary: 95 mg/dL (ref 70–99)
Glucose-Capillary: 118 mg/dL — ABNORMAL HIGH (ref 70–99)
Glucose-Capillary: 199 mg/dL — ABNORMAL HIGH (ref 70–99)
Glucose-Capillary: 187 mg/dL — ABNORMAL HIGH (ref 70–99)

## 2022-11-29 LAB — BASIC METABOLIC PANEL
Anion gap: 6 (ref 5–15)
BUN: 13 mg/dL (ref 8–23)
CO2: 30 mmol/L (ref 22–32)
Calcium: 9.1 mg/dL (ref 8.9–10.3)
Chloride: 105 mmol/L (ref 98–111)
Creatinine, Ser: 0.7 mg/dL (ref 0.44–1.00)
GFR, Estimated: >60 mL/min (ref >60)
Glucose, Bld: 111 mg/dL — ABNORMAL HIGH (ref 70–99)
Potassium: 4 mmol/L (ref 3.5–5.1)
Sodium: 141 mmol/L (ref 135–145)

## 2022-11-29 LAB — MAGNESIUM: Magnesium: 1.9 mg/dL (ref 1.7–2.4)

## 2022-11-29 MED ORDER — GUAIFENESIN 200 MG PO TABS
200.0000 mg | ORAL_TABLET | ORAL | Status: DC | PRN
  Administered 2022-12-01 – 2022-12-02 (×4): 200 mg via ORAL
  Filled 2022-11-29 (×5): qty 1

## 2022-11-29 MED ORDER — FUROSEMIDE 40 MG PO TABS
40.0000 mg | ORAL_TABLET | Freq: Every day | ORAL | Status: DC
  Administered 2022-11-29 – 2022-12-04 (×6): 40 mg via ORAL
  Filled 2022-11-29 (×6): qty 1

## 2022-11-29 MED ORDER — PHENOL 1.4 % MT LIQD
1.0000 | OROMUCOSAL | Status: DC | PRN
  Administered 2022-11-29: 1 via OROMUCOSAL
  Filled 2022-11-29: qty 177

## 2022-11-29 MED ORDER — DICLOFENAC SODIUM 1 % EX GEL
2.0000 g | Freq: Four times a day (QID) | CUTANEOUS | Status: DC
  Administered 2022-11-29 – 2022-12-04 (×13): 2 g via TOPICAL
  Filled 2022-11-29: qty 100

## 2022-11-29 MED ORDER — LOSARTAN POTASSIUM 25 MG PO TABS
25.0000 mg | ORAL_TABLET | Freq: Every day | ORAL | Status: DC
  Administered 2022-11-29 – 2022-12-04 (×6): 25 mg via ORAL
  Filled 2022-11-29 (×6): qty 1

## 2022-11-29 MED ORDER — SPIRONOLACTONE 12.5 MG HALF TABLET
12.5000 mg | ORAL_TABLET | Freq: Every day | ORAL | Status: DC
  Administered 2022-11-29 – 2022-12-04 (×6): 12.5 mg via ORAL
  Filled 2022-11-29 (×6): qty 1

## 2022-11-29 NOTE — Progress Notes (Signed)
CBG 68, juice given. Recheck 64, juice given.

## 2022-11-29 NOTE — Progress Notes (Signed)
Progress Note   Patient: [name] ASN:[id number] DOB: August 21, 1951 DOA: 11/26/2022     3 DOS: the patient was seen and examined on 11/29/2022   Brief hospital course: [name] was admitted to the hospital with the working diagnosis of new onset atrial fibrillation.   72 yo female with the past medical history of hypertension, T2DM, hypertension, SAH in 2007, who presented with dyspnea. Reported 3 to 4 weeks of dry cough and dyspnea on exertion. Over the last week she developed orthopnea and palpitations that prompted her to come to the ED. On her initial physical examination her blood pressure was 99/75, HR 124, RR 25 and 02 satuarion 96%, lungs with rales bilaterally, increase work of breathing, heart with S1 and S2 present irregularly irregular, with no gallops, abdomen with no distention and positive lower extremity edema.   Na 138, K 4,6 Cl 103 bicarbonate 20, glucose 265, bun 19, cr 1,13 BNP 548  Wbc 6,8 hgb 15.1 plt 184  Sars covid 19 negative  Urina analysis SG 1,034, 100 protein, >50 wbc   Chest radiograph with cardiomegaly, bilateral hilar vascular congestion, positive moderate right pleural effusion with opacity at the right base, atelectasis.   CT chest with no pulmonary embolism, positive right pleural effusion with compression atelectasis, positive bilateral ground glass opacities.   EKG 155 bpm, right axis deviation, right bundle branch block, atrial fibrillation rhythm with no significant ST segment or T wave changes.   Patient was placed on diltiazem drip and received IV digoxin load.  Right sided thoracentesis.  Diuresis with IV furosemide.   01/13 atrial fibrillation with rate controlled.  01/14 patient hemodynamically stable, continue to be very weak and deconditioned, possible transfer to CIR.   Assessment and Plan: * Acute on chronic diastolic CHF (congestive heart failure) (HCC) Echocardiogram with preserved LV systolic function EF 65 to 19%, RV systolic function  with mild reduction. Mild dilatated RV cavity. Mild mitral valve stenosis.  Heart failure management with spironolactone, losartan, metoprolol succinate and diuresis with furosemide.  No SGLT 2 inh due to type 1 DM.   Acute hypoxemic respiratory failure due to acute cardiogenic pulmonary edema Right pleural effusion Sp thoracentesis (transudate per fluid analysis).  Continue oxymetry monitoring.  COVID test at home inconclusive and she had URI symptoms 2 to 3 weeks ago. Admission COVID test negative, discontinue droplet precautions.   Oxymetry is 98% on 1 L/min per [place].  Continue oxymetry monitoring   A-fib (HCC) Atrial fibrillation with rate control, now consolidated with metoprolol 100 mg succinate.   Old records have been reviewed, neurology visit in 2013 reported history of perimesencephalic subarachnoid hemorrhage.  Will hold on anticoagulation for now and will check with primary neurology risk of bleeding with apixaban.    AKI (acute kidney injury) ([place]) Hyperkalemia.  Renal function stable with serum cr at 0,70, K is 4,0 and serum bicarbonate at 30. Mg 1,9 NA 141 Continue diuresis and follow up renal function in am  Essential hypertension Continue blood pressure control with metoprolol.  Resumed losartan and added spironolactone.    Type 1 diabetes mellitus on insulin therapy (HCC) Continue insulin sliding scale for glucose cover and monitoring Basal insulin 10 units. (Home dose).  Continue with statin therapy.   Glucose well controlled with fasting glucose at 111 mg/dl.   Class 2 obesity Calculated BMI is 38,1  Ambulatory dysfunction with bilateral knee osteoarthritis.         Subjective: Patient with improved dyspnea, positive right shoulder pain with  difficulty ambulating, no nausea or vomiting,   Physical Exam: Vitals:   11/29/22 0748 11/29/22 1000 11/29/22 1152 11/29/22 1200  BP: (!) 150/78 (!) 141/71 (!) 156/97 (!) 156/97  Pulse: 98 (!) 102 94 95   Resp: 20 (!) 28 19 18   Temp: 98 F (36.7 C) 97.6 F (36.4 C) 97.8 F (36.6 C)   TempSrc: Oral Oral Oral   SpO2: 97% 98% 98% 98%  Weight:      Height:       Neurology awake and alert ENT with mild pallor but not icterus Cardiovascular with S1 and S2 present, irregularly irregular with no gallops, rubs or murmurs Respiratory with mild rales but not rhonchi, no wheezing Abdomen with no distention  Trace lower extremity edema Hypertrophic knees  Data Reviewed:    Family Communication: no family at the bedside   Disposition: Status is: Inpatient Remains inpatient appropriate because: transfer to CIR  Planned Discharge Destination: CIR      Author: Tawni Millers, MD 11/29/2022 4:35 PM  For on call review [email or web address].

## 2022-11-29 NOTE — Evaluation (Signed)
Occupational Therapy Evaluation Patient Details Name: [name] MRN: [id number] DOB: 02/07/1951 Today's Date: 11/29/2022   History of Present Illness 72 yo female admitted 1/11 for Acute on chronic diastolic CHF. Past medical history of hypertension, T2DM, hypertension, SAH in 2007   Clinical Impression   Pt reports needing some assist at baseline for ADLs  and uses RW for mobility, lives with spouse who can assist at d/c. Pt currently with significant RUE pain and limited mobility, needing min-max A for ADLs, max A +2 for bed mobility and max A +2 for transfers with attempts using RW and Stedy. Pt unable to reach RUE out to hold onto [place], and uses LUE to place RUE on RW. Pt presenting with impairments listed below, will follow acutely. Recommend AIR at d/c.      Recommendations for follow up therapy are one component of a multi-disciplinary discharge planning process, led by the attending physician.  Recommendations may be updated based on patient status, additional functional criteria and insurance authorization.   Follow Up Recommendations  Acute inpatient rehab (3hours/day)     Assistance Recommended at Discharge Frequent or constant Supervision/Assistance  Patient can return home with the following Assistance with cooking/housework;A lot of help with walking and/or transfers;A lot of help with bathing/dressing/bathroom;Direct supervision/assist for medications management;Direct supervision/assist for financial management;Assist for transportation;Help with stairs or ramp for entrance    Functional Status Assessment  Patient has had a recent decline in their functional status and demonstrates the ability to make significant improvements in function in a reasonable and predictable amount of time.  Equipment Recommendations  Other (comment) (defer)    Recommendations for Other Services PT consult;Rehab consult     Precautions / Restrictions Precautions Precautions:  Fall Precaution Comments: Reports several falls Restrictions Weight Bearing Restrictions: No Other Position/Activity Restrictions: painful R shoulder      Mobility Bed Mobility Overal bed mobility: Needs Assistance Bed Mobility: Supine to Sit, Sit to Supine       Sit to supine: Max assist Sit to sidelying: Max assist, +2 for physical assistance General bed mobility comments: +2 assist to return to bed due to painful RUE    Transfers Overall transfer level: Needs assistance Equipment used: Rolling walker (2 wheels), Ambulation equipment used Transfers: Sit to/from Stand Sit to Stand: Max assist, From elevated surface           General transfer comment: use of stedy and RW, attempted to pull up with LUE only on Stedy, pt unable      Balance Overall balance assessment: Needs assistance Sitting-balance support: Feet supported, Single extremity supported Sitting balance-Leahy Scale: Poor Sitting balance - Comments: Requires single UE support to stabilize on EOB. Falling posteriorly frequently, but gradually improved over course of session with training. Postural control: Posterior lean Standing balance support: Bilateral upper extremity supported, Reliant on assistive device for balance Standing balance-Leahy Scale: Zero                             ADL either performed or assessed with clinical judgement   ADL Overall ADL's : Needs assistance/impaired Eating/Feeding: Minimal assistance   Grooming: Sitting;Minimal assistance   Upper Body Bathing: Moderate assistance;Sitting   Lower Body Bathing: Maximal assistance;Sit to/from stand;Sitting/lateral leans   Upper Body Dressing : Moderate assistance;Sitting   Lower Body Dressing: Maximal assistance;Sitting/lateral leans   Toilet Transfer: Maximal assistance;+2 for physical assistance   Toileting- Clothing Manipulation and Hygiene: Maximal assistance  Functional mobility during ADLs: Maximal  assistance;Rolling walker (2 wheels)       Vision   Vision Assessment?: No apparent visual deficits     Perception Perception Perception Tested?: No   Praxis Praxis Praxis tested?: Not tested    Pertinent Vitals/Pain Pain Assessment Pain Assessment: Faces Pain Score: 7  Faces Pain Scale: Hurts even more Pain Location: R shoulder with movement Pain Descriptors / Indicators: Discomfort, Grimacing, Guarding Pain Intervention(s): Limited activity within patient's tolerance     Hand Dominance Right   Extremity/Trunk Assessment Upper Extremity Assessment Upper Extremity Assessment: RUE deficits/detail RUE Deficits / Details: trace shoulder movement, can flex elbow to bring hand to mouth with increased time, can flex/ext digits, per report pt needs shoulder surgery RUE Coordination: decreased fine motor;decreased gross motor   Lower Extremity Assessment Lower Extremity Assessment: Defer to PT evaluation   Cervical / Trunk Assessment Cervical / Trunk Assessment: Normal   Communication Communication Communication: No difficulties   Cognition Arousal/Alertness: Awake/alert Behavior During Therapy: WFL for tasks assessed/performed Overall Cognitive Status: Within Functional Limits for tasks assessed                                       General Comments  VSS on RA, O2 left off during and at end of session, RN aware    Exercises     Shoulder Instructions      Home Living Family/patient expects to be discharged to:: Private residence Living Arrangements: Spouse/significant other;Children Available Help at Discharge: Family;Available PRN/intermittently Type of Home: House Home Access: Stairs to enter [employer] of Steps: 3 Entrance Stairs-Rails: Right Home Layout: One level     Bathroom Shower/Tub: Walk-in shower;Door     Bathroom Accessibility: No   Home Equipment: Grab bars - tub/shower;Toilet riser;Rolling Walker (2  wheels);Rollator (4 wheels);Shower seat;Adaptive equipment Adaptive Equipment: Sock aid Additional Comments: does not wear O2 at baseline      Prior Functioning/Environment Prior Level of Function : Needs assist;History of Falls (last six months)             Mobility Comments: Uses RW for short distance mobility in the house. No O2 at baseline. Reports difficulty with mobility due to knee issues. Hx Rt TKA. ADLs Comments: spouse assists with LB dressing, has sock aid Family has been helping getting in/out of shower for past 2 months.        OT Problem List: Decreased strength;Decreased activity tolerance;Decreased range of motion;Impaired balance (sitting and/or standing);Decreased safety awareness      OT Treatment/Interventions: Self-care/ADL training;Therapeutic exercise;Energy conservation;DME and/or AE instruction;Therapeutic activities;Patient/family education;Balance training    OT Goals(Current goals can be found in the care plan section) Acute Rehab OT Goals Patient Stated Goal: none stated OT Goal Formulation: With patient Time For Goal Achievement: 12/13/22 Potential to Achieve Goals: Good ADL Goals Pt Will Perform Upper Body Dressing: with min assist;sitting Pt Will Perform Lower Body Dressing: with adaptive equipment;with min assist;sitting/lateral leans;sit to/from stand;bed level Pt Will Transfer to Toilet: stand pivot transfer;bedside commode;with min assist  OT Frequency: Min 2X/week    Co-evaluation              AM-PAC OT "6 Clicks" Daily Activity     Outcome Measure Help from another person eating meals?: A Little Help from another person taking care of personal grooming?: A Little Help from another person toileting, which includes using toliet, bedpan, or urinal?:  A Lot Help from another person bathing (including washing, rinsing, drying)?: A Lot Help from another person to put on and taking off regular upper body clothing?: A Lot Help from another  person to put on and taking off regular lower body clothing?: A Lot 6 Click Score: 14   End of Session Equipment Utilized During Treatment: Rolling walker (2 wheels);Other (comment) (stedy) Nurse Communication: Mobility status;Other (comment) (O2 left off, pt satting in 90's)  Activity Tolerance: Patient tolerated treatment well Patient left: in bed;with call bell/phone within reach;with bed alarm set  OT Visit Diagnosis: Unsteadiness on feet (R26.81);Other abnormalities of gait and mobility (R26.89);Repeated falls (R29.6);Muscle weakness (generalized) (M62.81);History of falling (Z91.81)                Time: 6295-2841 OT Time Calculation (min): 28 min Charges:  OT General Charges $OT Visit: 1 Visit OT Evaluation $OT Eval Moderate Complexity: 1 Mod OT Treatments $Therapeutic Activity: 8-22 mins  Renaye Rakers, OTD, OTR/L SecureChat Preferred Acute Rehab [phone]  Renaye Rakers [place] 11/29/2022, 12:42 PM

## 2022-11-29 NOTE — Progress Notes (Signed)
IP rehab admissions - I met with patient at the bedside and explained inpatient rehab.  I gave patient rehab booklets.  Patient would like CIR and then home with husband/dtr.  I will have my partners follow up on Monday for plans.  Call me for questions.  [phone]
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
## 2022-11-30 ENCOUNTER — Inpatient Hospital Stay (HOSPITAL_COMMUNITY): Payer: Medicare Other

## 2022-11-30 ENCOUNTER — Telehealth: Payer: Self-pay | Admitting: Orthopaedic Surgery

## 2022-11-30 ENCOUNTER — Ambulatory Visit: Payer: Medicare Other | Admitting: Orthopaedic Surgery

## 2022-11-30 DIAGNOSIS — I5033 Acute on chronic diastolic (congestive) heart failure: Secondary | ICD-10-CM | POA: Diagnosis not present

## 2022-11-30 DIAGNOSIS — E109 Type 1 diabetes mellitus without complications: Secondary | ICD-10-CM | POA: Diagnosis not present

## 2022-11-30 DIAGNOSIS — I10 Essential (primary) hypertension: Secondary | ICD-10-CM | POA: Diagnosis not present

## 2022-11-30 DIAGNOSIS — I480 Paroxysmal atrial fibrillation: Secondary | ICD-10-CM | POA: Diagnosis not present

## 2022-11-30 LAB — BASIC METABOLIC PANEL
Anion gap: 10 (ref 5–15)
BUN: 10 mg/dL (ref 8–23)
CO2: 30 mmol/L (ref 22–32)
Calcium: 9.1 mg/dL (ref 8.9–10.3)
Chloride: 98 mmol/L (ref 98–111)
Creatinine, Ser: 0.75 mg/dL (ref 0.44–1.00)
GFR, Estimated: >60 mL/min (ref >60)
Glucose, Bld: 227 mg/dL — ABNORMAL HIGH (ref 70–99)
Potassium: 3.9 mmol/L (ref 3.5–5.1)
Sodium: 138 mmol/L (ref 135–145)

## 2022-11-30 LAB — GLUCOSE, CAPILLARY
Glucose-Capillary: 156 mg/dL — ABNORMAL HIGH (ref 70–99)
Glucose-Capillary: 168 mg/dL — ABNORMAL HIGH (ref 70–99)
Glucose-Capillary: 162 mg/dL — ABNORMAL HIGH (ref 70–99)
Glucose-Capillary: 207 mg/dL — ABNORMAL HIGH (ref 70–99)

## 2022-11-30 NOTE — Progress Notes (Signed)
Heart Failure Navigator Progress Note  Assessed for Heart & Vascular TOC clinic readiness.  Patient Ef 65-70%, weak and deconditioned, plan for CIR at discharge. .   Navigator will sign off at this time. Earnestine Leys, BSN, RN Heart Failure Transport planner Only

## 2022-11-30 NOTE — Care Management Important Message (Signed)
Important Message  Patient Details  Name: [name] MRN: [id number] Date of Birth: 01/11/1951   Medicare Important Message Given:  Yes     Ezekeil Bethel Montine Circle 11/30/2022, 3:29 PM
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
## 2022-11-30 NOTE — Telephone Encounter (Signed)
Patient had to cancel her appt. Today due to being in [place]. She will be in hospital  for at least a week./ patient would like for Dr. Ninfa Linden to check her in hospital if possible.

## 2022-11-30 NOTE — Progress Notes (Addendum)
Progress Note   Patient: [name] NIO:[id number] DOB: 04-24-1951 DOA: 11/26/2022     4 DOS: the patient was seen and examined on 11/30/2022   Brief hospital course: [name] was admitted to the hospital with the working diagnosis of new onset atrial fibrillation.   72 yo female with the past medical history of hypertension, T2DM, hypertension, SAH in 2007, who presented with dyspnea. Reported 3 to 4 weeks of dry cough and dyspnea on exertion. Over the last week she developed orthopnea and palpitations that prompted her to come to the ED. On her initial physical examination her blood pressure was 99/75, HR 124, RR 25 and 02 satuarion 96%, lungs with rales bilaterally, increase work of breathing, heart with S1 and S2 present irregularly irregular, with no gallops, abdomen with no distention and positive lower extremity edema.   Na 138, K 4,6 Cl 103 bicarbonate 20, glucose 265, bun 19, cr 1,13 BNP 548  Wbc 6,8 hgb 15.1 plt 184  Sars covid 19 negative  Urina analysis SG 1,034, 100 protein, >50 wbc   Chest radiograph with cardiomegaly, bilateral hilar vascular congestion, positive moderate right pleural effusion with opacity at the right base, atelectasis.   CT chest with no pulmonary embolism, positive right pleural effusion with compression atelectasis, positive bilateral ground glass opacities.   EKG 155 bpm, right axis deviation, right bundle branch block, atrial fibrillation rhythm with no significant ST segment or T wave changes.   Patient was placed on diltiazem drip and received IV digoxin load.  Right sided thoracentesis.  Diuresis with IV furosemide.   01/13 atrial fibrillation with rate controlled.  01/14 patient hemodynamically stable, continue to be very weak and deconditioned, possible transfer to CIR.  01/15 pending insurance authorization for CIR.   Assessment and Plan: * Acute on chronic diastolic CHF (congestive heart failure) (HCC) Echocardiogram with preserved LV  systolic function EF 65 to 81%, RV systolic function with mild reduction. Mild dilatated RV cavity. Mild mitral valve stenosis.  Heart failure management with spironolactone, losartan, metoprolol succinate and diuresis with furosemide.  No SGLT 2 inh due to type 1 DM.   Acute hypoxemic respiratory failure due to acute cardiogenic pulmonary edema Right pleural effusion Sp thoracentesis (transudate per fluid analysis).  Continue oxymetry monitoring.  COVID test at home inconclusive and she had URI symptoms 2 to 3 weeks ago. Admission COVID test negative, discontinue droplet precautions.   Oxymetry is 98% on 1 L/min per [place].  Continue oxymetry monitoring   A-fib (HCC) Atrial fibrillation with rate control, now consolidated with metoprolol 100 mg succinate.   Old records have been reviewed, neurology visit in 2013 reported history of perimesencephalic subarachnoid hemorrhage.  2007 MRI with subarachnoid hemorrhage layering along the right anterolateral aspect of he brain stem, within the paramesencephalic cistern and along the right temporal and parietal sulci. Interventricular hemorrhage with fluid -fluid levels in the ventricles bilaterally.  No focal lesion to explain patient's hemorrhage.   Considering this past findings will hold on anticoagulation for now.   AKI (acute kidney injury) ([place]) Hyperkalemia.  Renal function has been stable with serum ct at 0,75, K is 3,9 and serum bicarbonate at 30.  Na 138 and CL 98.   Essential hypertension Continue blood pressure control with metoprolol.  Continue with losartan and spironolactone.    Type 1 diabetes mellitus on insulin therapy (HCC) Continue insulin sliding scale for glucose cover and monitoring Basal insulin 10 units. (Home dose).  Continue with statin therapy.   Glucose  well controlled with fasting glucose at 111 mg/dl.   Class 2 obesity Calculated BMI is 38,1  Ambulatory dysfunction with bilateral knee osteoarthritis.    Right shoulder osteoarthritis. Will check radiographs. Continue pain control and outpatient follow up with orthopedics.         Subjective: Patient with right shoulder pain, worse with active and passive movement, no dyspnea or chest pain   Physical Exam: Vitals:   11/30/22 0350 11/30/22 0500 11/30/22 0835 11/30/22 1228  BP: (!) 150/86  (!) 159/76 137/69  Pulse: 80  (!) 109 88  Resp: 16  17 20   Temp: 97.9 F (36.6 C)  98.1 F (36.7 C) 98.1 F (36.7 C)  TempSrc: Oral  Oral Oral  SpO2: 96%  98% 97%  Weight:  105.6 kg    Height:       Neurology awake and alert ENT with no pallor Cardiovascular with S1 and S2 present, irregularly irregular with no gallops, rubs or murmurs Respiratory with no rales or wheezing Abdomen with no distention No lower extremity edema  Data Reviewed:    Family Communication: no family at the bedside   Disposition: Status is: Inpatient Remains inpatient appropriate because: Pending transfer to CIR   Planned Discharge Destination: Rehab      Author: Tawni Millers, MD 11/30/2022 3:09 PM  For on call review [email or web address].

## 2022-11-30 NOTE — Progress Notes (Signed)
Inpatient Rehab Admissions Coordinator:  ?Insurance authorization started. Will continue to follow. ? ? ?Madalynn Pickelsimer Graves Madden, MS, CCC-SLP ?Admissions Coordinator ?[phone] ? ?

## 2022-12-01 DIAGNOSIS — I480 Paroxysmal atrial fibrillation: Secondary | ICD-10-CM | POA: Diagnosis not present

## 2022-12-01 DIAGNOSIS — E109 Type 1 diabetes mellitus without complications: Secondary | ICD-10-CM | POA: Diagnosis not present

## 2022-12-01 DIAGNOSIS — E669 Obesity, unspecified: Secondary | ICD-10-CM | POA: Diagnosis not present

## 2022-12-01 DIAGNOSIS — I5033 Acute on chronic diastolic (congestive) heart failure: Secondary | ICD-10-CM | POA: Diagnosis not present

## 2022-12-01 LAB — CULTURE, BLOOD (ROUTINE X 2)
Culture: NO GROWTH
Culture: NO GROWTH

## 2022-12-01 LAB — GLUCOSE, CAPILLARY
Glucose-Capillary: 133 mg/dL — ABNORMAL HIGH (ref 70–99)
Glucose-Capillary: 170 mg/dL — ABNORMAL HIGH (ref 70–99)
Glucose-Capillary: 197 mg/dL — ABNORMAL HIGH (ref 70–99)
Glucose-Capillary: 177 mg/dL — ABNORMAL HIGH (ref 70–99)

## 2022-12-01 MED ORDER — DILTIAZEM HCL 60 MG PO TABS
30.0000 mg | ORAL_TABLET | Freq: Four times a day (QID) | ORAL | Status: DC
  Administered 2022-12-01 – 2022-12-02 (×4): 30 mg via ORAL
  Filled 2022-12-01 (×4): qty 1

## 2022-12-01 NOTE — Progress Notes (Signed)
Progress Note   Patient: [name] GEZ:[id number] DOB: 06/20/1951 DOA: 11/26/2022     5 DOS: the patient was seen and examined on 12/01/2022   Brief hospital course: [name] was admitted to the hospital with the working diagnosis of new onset atrial fibrillation.   72 yo female with the past medical history of hypertension, T2DM, hypertension, SAH in 2007, who presented with dyspnea. Reported 3 to 4 weeks of dry cough and dyspnea on exertion. Over the last week she developed orthopnea and palpitations that prompted her to come to the ED. On her initial physical examination her blood pressure was 99/75, HR 124, RR 25 and 02 satuarion 96%, lungs with rales bilaterally, increase work of breathing, heart with S1 and S2 present irregularly irregular, with no gallops, abdomen with no distention and positive lower extremity edema.   Na 138, K 4,6 Cl 103 bicarbonate 20, glucose 265, bun 19, cr 1,13 BNP 548  Wbc 6,8 hgb 15.1 plt 184  Sars covid 19 negative  Urina analysis SG 1,034, 100 protein, >50 wbc   Chest radiograph with cardiomegaly, bilateral hilar vascular congestion, positive moderate right pleural effusion with opacity at the right base, atelectasis.   CT chest with no pulmonary embolism, positive right pleural effusion with compression atelectasis, positive bilateral ground glass opacities.   EKG 155 bpm, right axis deviation, right bundle branch block, atrial fibrillation rhythm with no significant ST segment or T wave changes.   Patient was placed on diltiazem drip and received IV digoxin load.  Right sided thoracentesis.  Diuresis with IV furosemide.   01/13 atrial fibrillation with rate controlled.  01/14 patient hemodynamically stable, continue to be very weak and deconditioned, possible transfer to CIR.  01/15 pending insurance authorization for CIR.  01/16 atrial fibrillation with RVR  Assessment and Plan: * Acute on chronic diastolic CHF (congestive heart failure)  (HCC) Echocardiogram with preserved LV systolic function EF 65 to 65%, RV systolic function with mild reduction. Mild dilatated RV cavity. Mild mitral valve stenosis.  Urine output is 1,400 cc Since admission volume balance negative 8,369 ml.  Systolic blood pressure 035 to 160 mmHg.   Heart failure management with spironolactone, losartan, metoprolol succinate and diuresis with furosemide.  No SGLT 2 inh due to type 1 DM.   Acute hypoxemic respiratory failure due to acute cardiogenic pulmonary edema Right pleural effusion Sp thoracentesis (transudate per fluid analysis).  Continue oxymetry monitoring.  COVID test at home inconclusive and she had URI symptoms 2 to 3 weeks ago. Admission COVID test negative, discontinue droplet precautions.   Oxymetry is 94% on 1 L/min per [place].  Continue oxymetry monitoring   A-fib [facility]) Old records have been reviewed, neurology visit in 2013 reported history of perimesencephalic subarachnoid hemorrhage.  2007 MRI with subarachnoid hemorrhage layering along the right anterolateral aspect of he brain stem, within the paramesencephalic cistern and along the right temporal and parietal sulci. Interventricular hemorrhage with fluid -fluid levels in the ventricles bilaterally.  No focal lesion to explain patient's hemorrhage.   Considering this past findings will hold on anticoagulation for now.  Rate has been well controlled until today, telemetry with RVR atrial fibrillation Plan to continue with metoprolol succinate 100 mg and will add diltiazem 30 mg qid. If continue uncontrolled with consult cardiology.   AKI (acute kidney injury) ([place]) Hyperkalemia.  Serum ct is 0,72 with K at 3,9 and serum bicarbonate at 30. Plan to continue diuretic therapy and follow up renal function and electrolytes in am  Essential hypertension Continue blood pressure control with metoprolol.  Continue with losartan and spironolactone.    Type 1 diabetes mellitus on  insulin therapy (HCC) Continue insulin sliding scale for glucose cover and monitoring Basal insulin 10 units. (Home dose).  Continue with statin therapy.   Glucose well controlled with fasting glucose at 111 mg/dl.   Class 2 obesity Calculated BMI is 38,1  Ambulatory dysfunction with bilateral knee osteoarthritis.   Right shoulder osteoarthritis. Radiographs with no acute fracture, hardware in place Continue pain control and outpatient follow up with orthopedics.         Subjective: Patient with improvement in right shoulder pain, no chest pain, dyspnea or palpitations. Continue very weak and deconditioned   Physical Exam: Vitals:   12/01/22 1122 12/01/22 1654 12/01/22 1700 12/01/22 1940  BP: (!) 157/86 (!) 146/86  (!) 162/80  Pulse: 98 (!) 118 100 97  Resp: 20 (!) 21 18 19   Temp: 98.2 F (36.8 C) 98.5 F (36.9 C)  98.5 F (36.9 C)  TempSrc: Oral Oral  Oral  SpO2: 98% 100%  94%  Weight:      Height:       Neurology awake and alert ENT with no pallor Cardiovascular with S1 and S2 present irregularly irregular with no gallops, rubs or gallops Respiratory with no rales or wheezing Abdomen with no distention No lower extremity edema  Data Reviewed:    Family Communication: no family at the bedside   Disposition: Status is: Inpatient Remains inpatient appropriate because: pending transfer CIR  Planned Discharge Destination: Home      Author: Tawni Millers, MD 12/01/2022 11:10 PM  For on call review [email or web address].

## 2022-12-01 NOTE — Progress Notes (Signed)
PROGRESS NOTE    [name]  UXL:[id number] DOB: 07/14/1951 DOA: 11/26/2022 PCP: Brandi Fillers, MD  71/F w hypertension, T2DM, hypertension, SAH in 2007, who presented with 3 to 4 weeks of dry cough and dyspnea on exertion, > developed orthopnea and palpitations. In ED, BP 99/75, HR 124, + rales bilaterally, Tachypnea, AFib RVR  -CXR w/cardiomegaly, bilateral hilar vascular congestion, positive moderate right pleural effusion with opacity at the right base,CTA no PE, positive right pleural effusion with compression atelectasis, positive bilateral ground glass opacities.   -placed on diltiazem drip and received IV digoxin load.  - Right sided thoracentesis.  -Diuresis with IV furosemide.  -1/13 rate controlled.  -1/14 hemodynamically stable, weak and deconditioned, possible transfer to CIR.  -1/15 CIR   Subjective: Feels well, no complaints, no events overnight   Assessment and Plan:  Acute on chronic diastolic CHF  -Echo EF 65 to 70%, RV systolic function with mild reduction. Mild dilatated RV cavity.  -diuresed with IV lasix -now on spironolactone, losartan, metoprolol succinate and furosemide.  No SGLT 2 inh due to type 1 DM.  -Discharge planning, CIR today bed available  Acute hypoxemic respiratory failure  -from CHF/Right pleural effusion -Sp thoracentesis (transudate per fluid analysis).  -wean O2  A-fib w/ RVR -now on metoprolol 100 mg succinate.  And diltiazem every 6h, will consolidate dose -Old records reviewed, neurology visit in 2013 reported history of perimesencephalic subarachnoid hemorrhage.  2007 MRI with subarachnoid hemorrhage layering along the right anterolateral aspect of he brain stem, within the paramesencephalic cistern and along the right temporal and parietal sulci. Interventricular hemorrhage with fluid -fluid levels in the ventricles bilaterally.  No focal lesion to explain patient's hemorrhage.  Considering this past findings will hold on  anticoagulation for now.  Add aspirin -unable to consider DCCV without anticoagulation  AKI (acute kidney injury) (HCC) Hyperkalemia. -improved  Essential hypertension Continue metoprolol, losartan and spironolactone.   Type 1 diabetes mellitus on insulin therapy (HCC) -Basal insulin 10 units. (Home dose).  Will increase dose -SSI  Class 2 obesity Calculated BMI is 38,1  Ambulatory dysfunction with bilateral knee osteoarthritis.   Right shoulder osteoarthritis. -X-ray with postsurgical changes, no acute findings Continue pain control and outpatient follow up with orthopedics.   DVT prophylaxis:lovenox Code Status: Full Code Family Communication: None present Disposition Plan: CIR for rehab  Consultants:    Procedures:   Antimicrobials:    Objective: Vitals:   12/01/22 2318 12/02/22 0315 12/02/22 0534 12/02/22 0846  BP: (!) 148/70 (!) 143/76  (!) 178/81  Pulse: 98 86  92  Resp: 20 14  19   Temp: 98.5 F (36.9 C) 98.2 F (36.8 C)  97.7 F (36.5 C)  TempSrc: Oral Oral  Oral  SpO2: 93% 96%  96%  Weight:   100.2 kg   Height:        Intake/Output Summary (Last 24 hours) at 12/02/2022 0959 Last data filed at 12/02/2022 0846 Gross per 24 hour  Intake 240 ml  Output 2500 ml  Net -2260 ml   Filed Weights   11/30/22 0500 12/01/22 0410 12/02/22 0534  Weight: 105.6 kg 103.5 kg 100.2 kg    Examination:   Obese pleasant female sitting up in bed, AAOx3 CVS: S1-S2, irregularly irregular rhythm Lungs: Clear bilaterally Abdomen: Soft, nontender, bowel sounds present Extremities: No edema   Data Reviewed:   CBC: Recent Labs  Lab 11/26/22 1326 11/27/22 0441 12/02/22 0130  WBC 6.8 9.5 5.0  NEUTROABS 5.1  --   --  HGB 15.1* 15.1* 13.8  HCT 51.0* 49.2* 45.2  MCV 90.6 89.6 86.9  PLT 184 154 329*   Basic Metabolic Panel: Recent Labs  Lab 11/26/22 1326 11/27/22 0441 11/27/22 1912 11/28/22 0049 11/29/22 0115 11/30/22 0059 12/02/22 0130  NA 138   < >  140 139 141 138 137  K 4.6   < > 3.8 4.0 4.0 3.9 3.5  CL 103   < > 105 104 105 98 96*  CO2 20*   < > 28 25 30 30 30   GLUCOSE 285*   < > 147* 174* 111* 227* 219*  BUN 19   < > 24* 23 13 10 10   CREATININE 1.13*   < > 1.23* 0.97 0.70 0.75 0.76  CALCIUM 9.3   < > 8.9 8.6* 9.1 9.1 8.8*  MG 1.9  --   --   --  1.9  --   --    < > = values in this interval not displayed.   GFR: Estimated Creatinine Clearance: 76.1 mL/min (by C-G formula based on SCr of 0.76 mg/dL). Liver Function Tests: No results for input(s): "AST", "ALT", "ALKPHOS", "BILITOT", "PROT", "ALBUMIN" in the last 168 hours. No results for input(s): "LIPASE", "AMYLASE" in the last 168 hours. No results for input(s): "AMMONIA" in the last 168 hours. Coagulation Profile: No results for input(s): "INR", "PROTIME" in the last 168 hours. Cardiac Enzymes: No results for input(s): "CKTOTAL", "CKMB", "CKMBINDEX", "TROPONINI" in the last 168 hours. BNP (last 3 results) No results for input(s): "PROBNP" in the last 8760 hours. HbA1C: No results for input(s): "HGBA1C" in the last 72 hours. CBG: Recent Labs  Lab 12/01/22 0605 12/01/22 1123 12/01/22 1627 12/01/22 2110 12/02/22 0613  GLUCAP 133* 170* 197* 177* 189*   Lipid Profile: No results for input(s): "CHOL", "HDL", "LDLCALC", "TRIG", "CHOLHDL", "LDLDIRECT" in the last 72 hours. Thyroid Function Tests: No results for input(s): "TSH", "T4TOTAL", "FREET4", "T3FREE", "THYROIDAB" in the last 72 hours. Anemia Panel: No results for input(s): "VITAMINB12", "FOLATE", "FERRITIN", "TIBC", "IRON", "RETICCTPCT" in the last 72 hours. Urine analysis:    Component Value Date/Time   COLORURINE YELLOW 11/27/2022 0616   APPEARANCEUR CLOUDY (A) 11/27/2022 0616   LABSPEC 1.034 (H) 11/27/2022 0616   PHURINE 5.0 11/27/2022 0616   GLUCOSEU NEGATIVE 11/27/2022 0616   HGBUR LARGE (A) 11/27/2022 0616   BILIRUBINUR NEGATIVE 11/27/2022 0616   KETONESUR 5 (A) 11/27/2022 0616   PROTEINUR 100 (A)  11/27/2022 0616   NITRITE NEGATIVE 11/27/2022 0616   LEUKOCYTESUR LARGE (A) 11/27/2022 0616   Sepsis Labs: @LABRCNTIP (procalcitonin:4,lacticidven:4)  ) Recent Results (from the past 240 hour(s))  Resp panel by RT-PCR (RSV, Flu A&B, Covid) Anterior Nasal Swab     Status: None   Collection Time: 11/26/22  1:01 PM   Specimen: Anterior Nasal Swab  Result Value Ref Range Status   SARS Coronavirus 2 by RT PCR NEGATIVE NEGATIVE Final    Comment: (NOTE) SARS-CoV-2 target nucleic acids are NOT DETECTED.  The SARS-CoV-2 RNA is generally detectable in upper respiratory specimens during the acute phase of infection. The lowest concentration of SARS-CoV-2 viral copies this assay can detect is 138 copies/mL. A negative result does not preclude SARS-Cov-2 infection and should not be used as the sole basis for treatment or other patient management decisions. A negative result may occur with  improper specimen collection/handling, submission of specimen other than nasopharyngeal swab, presence of viral mutation(s) within the areas targeted by this assay, and inadequate number of viral copies(<138 copies/mL). A  negative result must be combined with clinical observations, patient history, and epidemiological information. The expected result is Negative.  Fact Sheet for Patients:  EntrepreneurPulse.com.au  Fact Sheet for Healthcare Providers:  IncredibleEmployment.be  This test is no t yet approved or cleared by the [place] FDA and  has been authorized for detection and/or diagnosis of SARS-CoV-2 by FDA under an Emergency Use Authorization (EUA). This EUA will remain  in effect (meaning this test can be used) for the duration of the COVID-19 declaration under Section 564(b)(1) of the Act, 21 U.S.C.section 360bbb-3(b)(1), unless the authorization is terminated  or revoked sooner.       Influenza A by PCR NEGATIVE NEGATIVE Final   Influenza B by PCR  NEGATIVE NEGATIVE Final    Comment: (NOTE) The Xpert Xpress SARS-CoV-2/FLU/RSV plus assay is intended as an aid in the diagnosis of influenza from Nasopharyngeal swab specimens and should not be used as a sole basis for treatment. Nasal washings and aspirates are unacceptable for Xpert Xpress SARS-CoV-2/FLU/RSV testing.  Fact Sheet for Patients: EntrepreneurPulse.com.au  Fact Sheet for Healthcare Providers: IncredibleEmployment.be  This test is not yet approved or cleared by the [place] FDA and has been authorized for detection and/or diagnosis of SARS-CoV-2 by FDA under an Emergency Use Authorization (EUA). This EUA will remain in effect (meaning this test can be used) for the duration of the COVID-19 declaration under Section 564(b)(1) of the Act, 21 U.S.C. section 360bbb-3(b)(1), unless the authorization is terminated or revoked.     Resp Syncytial Virus by PCR NEGATIVE NEGATIVE Final    Comment: (NOTE) Fact Sheet for Patients: EntrepreneurPulse.com.au  Fact Sheet for Healthcare Providers: IncredibleEmployment.be  This test is not yet approved or cleared by the [place] FDA and has been authorized for detection and/or diagnosis of SARS-CoV-2 by FDA under an Emergency Use Authorization (EUA). This EUA will remain in effect (meaning this test can be used) for the duration of the COVID-19 declaration under Section 564(b)(1) of the Act, 21 U.S.C. section 360bbb-3(b)(1), unless the authorization is terminated or revoked.  Performed at [facility], [place] [address], [place], [place] [address]   Culture, blood (routine x 2)     Status: None   Collection Time: 11/26/22  1:05 PM   Specimen: BLOOD  Result Value Ref Range Status   Specimen Description BLOOD BLOOD RIGHT HAND  Final   Special Requests   Final    BOTTLES DRAWN AEROBIC AND ANAEROBIC Blood Culture results may not be optimal due to  an inadequate volume of blood received in culture bottles   Culture   Final    NO GROWTH 5 DAYS Performed at [facility], [place] [address], [place], [place] [address]    Report Status 12/01/2022 FINAL  Final  Culture, blood (routine x 2)     Status: None   Collection Time: 11/26/22  2:00 PM   Specimen: BLOOD  Result Value Ref Range Status   Specimen Description BLOOD LEFT ANTECUBITAL  Final   Special Requests   Final    BOTTLES DRAWN AEROBIC AND ANAEROBIC Blood Culture results may not be optimal due to an excessive volume of blood received in culture bottles   Culture   Final    NO GROWTH 5 DAYS Performed at [facility], [place] [address] [place],  [address]    Report Status 12/01/2022 FINAL  Final  Culture, body fluid w Gram Stain-bottle     Status: None (Preliminary result)  Collection Time: 11/27/22 10:17 AM   Specimen: Pleura  Result Value Ref Range Status   Specimen Description PLEURAL  Final   Special Requests NONE  Final   Culture   Final    NO GROWTH 4 DAYS Performed at [facility], [address] [place], [place] [address]    Report Status PENDING  Incomplete  Gram stain     Status: None   Collection Time: 11/27/22 10:17 AM   Specimen: Pleura  Result Value Ref Range Status   Specimen Description PLEURAL  Final   Special Requests NONE  Final   Gram Stain   Final    NO WBC SEEN NO ORGANISMS SEEN Performed at [facility], [address] [place], [place] [address]    Report Status 11/27/2022 FINAL  Final     Radiology Studies: DG Shoulder Right  Result Date: 11/30/2022 CLINICAL DATA:  Right shoulder pain EXAM: RIGHT SHOULDER - 2+ VIEW COMPARISON:  10/20/2021 FINDINGS: Postsurgical changes are noted in the proximal right humerus with deformity identified. Underlying bony thorax appears within normal limits. No fracture or dislocation is seen. Stable bone island is noted in the mid humerus. No new focal abnormality is seen.  IMPRESSION: Postsurgical  changes without acute abnormality. Electronically Signed   By: Alcide Clever M.D.   On: 11/30/2022 19:31     Scheduled Meds:  aspirin  325 mg Oral Daily   diclofenac Sodium  2 g Topical QID   diltiazem  30 mg Oral Q6H   enoxaparin (LOVENOX) injection  40 mg Subcutaneous Q24H   furosemide  40 mg Oral Daily   gabapentin  100 mg Oral QHS   insulin aspart  0-20 Units Subcutaneous TID WC   insulin aspart  0-5 Units Subcutaneous QHS   insulin glargine-yfgn  10 Units Subcutaneous Daily   latanoprost  1 drop Left Eye QHS   losartan  25 mg Oral Daily   metoprolol succinate  100 mg Oral Daily   mirabegron ER  50 mg Oral Daily   nortriptyline  10 mg Oral QHS   pravastatin  40 mg Oral Daily   sodium chloride flush  3 mL Intravenous Q12H   spironolactone  12.5 mg Oral Daily   Continuous Infusions:  sodium chloride       LOS: 6 days    Time spent:    Zannie Cove, MD [facility]   12/02/2022, 9:59 AM

## 2022-12-01 NOTE — Progress Notes (Signed)
Physical Therapy Treatment Patient Details Name: [name] MRN: [id number] DOB: 02-15-1951 Today's Date: 12/01/2022   History of Present Illness 72 yo female admitted 1/11 for Acute on chronic diastolic CHF. Past medical history of hypertension, T2DM, hypertension, SAH in 2007    PT Comments    Patient received in bed, she is agreeable to PT session. She is found to be soaking wet in bed. Purewick in place but obviously not functioning. Patient required max assist for supine to sit. Limited to no use of R UE due to pain. Poor initial sitting balance and trunk control falling posteriorly multiple times. Once positioned properly at edge of bed patient is able to maintain sitting with supervision. Performed LE strengthening exercises in sitting. Patient is very weak and will benefit from continued skilled PT to improve mobility and safety.       Recommendations for follow up therapy are one component of a multi-disciplinary discharge planning process, led by the attending physician.  Recommendations may be updated based on patient status, additional functional criteria and insurance authorization.  Follow Up Recommendations  Acute inpatient rehab (3hours/day)     Assistance Recommended at Discharge Frequent or constant Supervision/Assistance  Patient can return home with the following Two people to help with walking and/or transfers;Two people to help with bathing/dressing/bathroom   Equipment Recommendations  Other (comment) (TBD)    Recommendations for Other Services Rehab consult     Precautions / Restrictions Precautions Precautions: Fall Precaution Comments: Reports several falls Restrictions Weight Bearing Restrictions: No Other Position/Activity Restrictions: painful R shoulder with limited ROM     Mobility  Bed Mobility Overal bed mobility: Needs Assistance Bed Mobility: Supine to Sit, Sit to Supine     Supine to sit: Max assist, HOB elevated Sit to supine: Max  assist, +2 for physical assistance   General bed mobility comments: Required max +2 to get back into bed and slid up in bed for appropriate positioning.    Transfers                   General transfer comment: not attempted, patient with poor initial sitting balance, weak, limited use of R UE. No +2 assist available.    Ambulation/Gait               General Gait Details: unable   Stairs             Wheelchair Mobility    Modified Rankin (Stroke Patients Only)       Balance Overall balance assessment: Modified Independent Sitting-balance support: Feet supported, Single extremity supported Sitting balance-Leahy Scale: Fair Sitting balance - Comments: poor initial sitting balance with multiple posterior lob. Once assisted and sitting squarely at edge of bed she required supervision. Postural control: Posterior lean                                  Cognition Arousal/Alertness: Awake/alert Behavior During Therapy: WFL for tasks assessed/performed Overall Cognitive Status: Within Functional Limits for tasks assessed                                          Exercises Other Exercises Other Exercises: Seated LAQ, marching x 10 reps each. Losing balance posteriorly with LE exercises.    General Comments        Pertinent Vitals/Pain  Pain Assessment Pain Assessment: Faces Faces Pain Scale: Hurts even more Pain Location: R shoulder with movement Pain Descriptors / Indicators: Discomfort, Grimacing, Guarding Pain Intervention(s): Monitored during session    Home Living                          Prior Function            PT Goals (current goals can now be found in the care plan section) Acute Rehab PT Goals Patient Stated Goal: Get stronger, be independent again PT Goal Formulation: With patient Time For Goal Achievement: 12/12/22 Potential to Achieve Goals: Fair Progress towards PT goals: Progressing  toward goals    Frequency    Min 3X/week      PT Plan Current plan remains appropriate    Co-evaluation              AM-PAC PT "6 Clicks" Mobility   Outcome Measure  Help needed turning from your back to your side while in a flat bed without using bedrails?: A Lot Help needed moving from lying on your back to sitting on the side of a flat bed without using bedrails?: A Lot Help needed moving to and from a bed to a chair (including a wheelchair)?: Total Help needed standing up from a chair using your arms (e.g., wheelchair or bedside chair)?: Total Help needed to walk in hospital room?: Total Help needed climbing 3-5 steps with a railing? : Total 6 Click Score: 8    End of Session Equipment Utilized During Treatment: Oxygen Activity Tolerance: Patient tolerated treatment well Patient left: in bed;with call bell/phone within reach;with nursing/sitter in room Nurse Communication: Mobility status PT Visit Diagnosis: Muscle weakness (generalized) (M62.81);Other abnormalities of gait and mobility (R26.89);Pain;Repeated falls (R29.6) Pain - Right/Left: Right (Left Knee) Pain - part of body: Shoulder;Knee     Time: 1610-9604 PT Time Calculation (min) (ACUTE ONLY): 38 min  Charges:  $Therapeutic Exercise: 8-22 mins $Therapeutic Activity: 8-22 mins                     [employer], PT, [employer] 12/01/22,3:02 PM

## 2022-12-01 NOTE — Progress Notes (Signed)
Inpatient Rehab Admissions Coordinator:    I continue to await insurance auth for potential CIR admit. Case opened yesterday. I reviewed benefits with pt.   Clemens Catholic, [place], [place] Admissions Coordinator  [phone] ([place]) [phone] (office)

## 2022-12-02 DIAGNOSIS — I5033 Acute on chronic diastolic (congestive) heart failure: Secondary | ICD-10-CM | POA: Diagnosis not present

## 2022-12-02 LAB — GLUCOSE, CAPILLARY
Glucose-Capillary: 189 mg/dL — ABNORMAL HIGH (ref 70–99)
Glucose-Capillary: 166 mg/dL — ABNORMAL HIGH (ref 70–99)
Glucose-Capillary: 248 mg/dL — ABNORMAL HIGH (ref 70–99)
Glucose-Capillary: 183 mg/dL — ABNORMAL HIGH (ref 70–99)

## 2022-12-02 LAB — CBC
HCT: 45.2 % (ref 36.0–46.0)
Hemoglobin: 13.8 g/dL (ref 12.0–15.0)
MCH: 26.5 pg (ref 26.0–34.0)
MCHC: 30.5 g/dL (ref 30.0–36.0)
MCV: 86.9 fL (ref 80.0–100.0)
Platelets: 144 10*3/uL — ABNORMAL LOW (ref 150–400)
RBC: 5.2 MIL/uL — ABNORMAL HIGH (ref 3.87–5.11)
RDW: 16 % — ABNORMAL HIGH (ref 11.5–15.5)
WBC: 5 10*3/uL (ref 4.0–10.5)
nRBC: 0 % (ref 0.0–0.2)

## 2022-12-02 LAB — RESPIRATORY PANEL BY PCR

## 2022-12-02 LAB — BASIC METABOLIC PANEL
Anion gap: 11 (ref 5–15)
BUN: 10 mg/dL (ref 8–23)
CO2: 30 mmol/L (ref 22–32)
Calcium: 8.8 mg/dL — ABNORMAL LOW (ref 8.9–10.3)
Chloride: 96 mmol/L — ABNORMAL LOW (ref 98–111)
Creatinine, Ser: 0.76 mg/dL (ref 0.44–1.00)
GFR, Estimated: >60 mL/min (ref >60)
Glucose, Bld: 219 mg/dL — ABNORMAL HIGH (ref 70–99)
Potassium: 3.5 mmol/L (ref 3.5–5.1)
Sodium: 137 mmol/L (ref 135–145)

## 2022-12-02 LAB — CULTURE, BODY FLUID W GRAM STAIN -BOTTLE: Culture: NO GROWTH

## 2022-12-02 LAB — CYTOLOGY - NON PAP

## 2022-12-02 MED ORDER — ASPIRIN 325 MG PO TABS
325.0000 mg | ORAL_TABLET | Freq: Every day | ORAL | Status: DC
  Administered 2022-12-02 – 2022-12-04 (×3): 325 mg via ORAL
  Filled 2022-12-02 (×3): qty 1

## 2022-12-02 MED ORDER — DILTIAZEM HCL ER COATED BEADS 180 MG PO CP24
180.0000 mg | ORAL_CAPSULE | Freq: Every day | ORAL | Status: DC
  Administered 2022-12-02 – 2022-12-04 (×3): 180 mg via ORAL
  Filled 2022-12-02 (×3): qty 1

## 2022-12-02 MED ORDER — INSULIN GLARGINE-YFGN 100 UNIT/ML ~~LOC~~ SOLN
15.0000 [IU] | Freq: Every day | SUBCUTANEOUS | Status: DC
  Administered 2022-12-02 – 2022-12-03 (×2): 15 [IU] via SUBCUTANEOUS
  Filled 2022-12-02 (×3): qty 0.15

## 2022-12-02 NOTE — Progress Notes (Signed)
Inpatient Rehab Admissions Coordinator:    I continue to await insurance auth for potential CIR admit. I will call today and ensure they are working her case.   Clemens Catholic, [place], [place] Admissions Coordinator  [phone] ([place]) [phone] (office)

## 2022-12-02 NOTE — Progress Notes (Signed)
Occupational Therapy Treatment Patient Details Name: [name] MRN: [id number] DOB: 03/29/1951 Today's Date: 12/02/2022   History of present illness 72 yo female admitted 1/11 for Acute on chronic diastolic CHF. Past medical history of hypertension, T2DM, hypertension, SAH in 2007   OT comments  Pt with slow progression towards goals this session, needing max A for LB ADL at bed level, max A -max A +2 for bed mobility, pt very anxious with mobility/fearful of falling. Pt max A for standing attempt at EOB, continues difficulty with RUE mobility and flexing R knee. Pt presenting with impairments listed below, will follow acutely. Continue to recommend AIR at d/c.   Recommendations for follow up therapy are one component of a multi-disciplinary discharge planning process, led by the attending physician.  Recommendations may be updated based on patient status, additional functional criteria and insurance authorization.    Follow Up Recommendations  Acute inpatient rehab (3hours/day)     Assistance Recommended at Discharge Frequent or constant Supervision/Assistance  Patient can return home with the following  Assistance with cooking/housework;A lot of help with walking and/or transfers;A lot of help with bathing/dressing/bathroom;Direct supervision/assist for medications management;Direct supervision/assist for financial management;Assist for transportation;Help with stairs or ramp for entrance   Equipment Recommendations  Other (comment) (defer)    Recommendations for Other Services PT consult;Rehab consult    Precautions / Restrictions Precautions Precautions: Fall Precaution Comments: Reports several falls Restrictions Weight Bearing Restrictions: No Other Position/Activity Restrictions: painful R shoulder with limited ROM       Mobility Bed Mobility Overal bed mobility: Needs Assistance       Supine to sit: Max assist Sit to supine: Max assist, +2 for safety/equipment, +2  for physical assistance   General bed mobility comments: max +2-3 to return to bed    Transfers Overall transfer level: Needs assistance Equipment used: Rolling walker (2 wheels) Transfers: Sit to/from Stand Sit to Stand: Max assist, From elevated surface                 Balance Overall balance assessment: Modified Independent Sitting-balance support: Feet supported, Single extremity supported Sitting balance-Leahy Scale: Fair Sitting balance - Comments: initally poor, approaching fair by end of session Postural control: Posterior lean Standing balance support: Bilateral upper extremity supported, Reliant on assistive device for balance Standing balance-Leahy Scale: Zero                             ADL either performed or assessed with clinical judgement   ADL Overall ADL's : Needs assistance/impaired Eating/Feeding: Set up;Sitting Eating/Feeding Details (indicate cue type and reason): eating supine in bed upon arrival                 Lower Body Dressing: Maximal assistance Lower Body Dressing Details (indicate cue type and reason): adjusting socks at bed level             Functional mobility during ADLs: Maximal assistance;+2 for safety/equipment      Extremity/Trunk Assessment Upper Extremity Assessment Upper Extremity Assessment: RUE deficits/detail RUE Deficits / Details: trace shoulder movement, can flex elbow to bring hand to mouth with increased time, can flex/ext digits, per report pt needs shoulder surgery RUE Coordination: decreased fine motor;decreased gross motor   Lower Extremity Assessment Lower Extremity Assessment: Defer to PT evaluation        Vision   Vision Assessment?: No apparent visual deficits   Perception Perception Perception: Not tested  Praxis Praxis Praxis: Not tested    Cognition Arousal/Alertness: Awake/alert Behavior During Therapy: WFL for tasks assessed/performed Overall Cognitive Status: Within  Functional Limits for tasks assessed                                          Exercises Exercises: General Lower Extremity, General Upper Extremity General Exercises - Upper Extremity Shoulder Flexion: AROM, Left, 10 reps, Seated General Exercises - Lower Extremity Ankle Circles/Pumps: AROM, Both, 10 reps, Seated Long Arc Quad: AROM, Both, 10 reps, Seated Hip Flexion/Marching: AROM, Both, 10 reps, Seated    Shoulder Instructions       General Comments VSS    Pertinent Vitals/ Pain       Pain Assessment Pain Assessment: Faces Pain Score: 5  Faces Pain Scale: Hurts even more Pain Location: R shoulder with movement Pain Descriptors / Indicators: Discomfort, Grimacing, Guarding Pain Intervention(s): Limited activity within patient's tolerance, Monitored during session, Repositioned  Home Living                                          Prior Functioning/Environment              Frequency  Min 2X/week        Progress Toward Goals  OT Goals(current goals can now be found in the care plan section)  Progress towards OT goals: Progressing toward goals  Acute Rehab OT Goals Patient Stated Goal: none stated OT Goal Formulation: With patient Time For Goal Achievement: 12/13/22 Potential to Achieve Goals: Good ADL Goals Pt Will Perform Upper Body Dressing: with min assist;sitting Pt Will Perform Lower Body Dressing: with adaptive equipment;with min assist;sitting/lateral leans;sit to/from stand;bed level Pt Will Transfer to Toilet: stand pivot transfer;bedside commode;with min assist  Plan Discharge plan remains appropriate;Frequency remains appropriate    Co-evaluation                 AM-PAC OT "6 Clicks" Daily Activity     Outcome Measure   Help from another person eating meals?: A Little Help from another person taking care of personal grooming?: A Little Help from another person toileting, which includes using toliet,  bedpan, or urinal?: A Lot Help from another person bathing (including washing, rinsing, drying)?: A Lot Help from another person to put on and taking off regular upper body clothing?: A Lot Help from another person to put on and taking off regular lower body clothing?: A Lot 6 Click Score: 14    End of Session Equipment Utilized During Treatment: Rolling walker (2 wheels);Gait belt  OT Visit Diagnosis: Unsteadiness on feet (R26.81);Other abnormalities of gait and mobility (R26.89);Repeated falls (R29.6);Muscle weakness (generalized) (M62.81);History of falling (Z91.81)   Activity Tolerance Patient tolerated treatment well   Patient Left in bed;with call bell/phone within reach;with bed alarm set   Nurse Communication Mobility status        Time: 2423-5361 OT Time Calculation (min): 34 min  Charges: OT General Charges $OT Visit: 1 Visit OT Treatments $Therapeutic Activity: 23-37 mins  Renaye Rakers, OTD, OTR/L SecureChat Preferred Acute Rehab [phone] - [place] 12/02/2022, 3:09 PM

## 2022-12-02 NOTE — Inpatient Diabetes Management (Signed)
Inpatient Diabetes Program Recommendations  AACE/ADA: New Consensus Statement on Inpatient Glycemic Control (2015)  Target Ranges:  Prepandial:   less than 140 mg/dL      Peak postprandial:   less than 180 mg/dL (1-2 hours)      Critically ill patients:  140 - 180 mg/dL   Lab Results  Component Value Date   GLUCAP 189 (H) 12/02/2022   HGBA1C 7.4 (H) 11/27/2022    Review of Glycemic Control  Latest Reference Range & Units 12/01/22 11:23 12/01/22 16:27 12/01/22 21:10 12/02/22 06:13  Glucose-Capillary 70 - 99 mg/dL 170 (H) 197 (H) 177 (H) 189 (H)  (H): Data is abnormally high Diabetes history: DM Outpatient Diabetes medications: Lantus 10 units QD, Humalog TID Current orders for Inpatient glycemic control: Novolog 0-20 units TID, Novolog 0-5 units QHS, Semglee 10 units QD  Inpatient Diabetes Program Recommendations:    Consider increasing Semglee to 14 units QD.   Thanks, Bronson Curb, MSN, RNC-OB Diabetes Coordinator [phone] (8a-5p)

## 2022-12-03 LAB — BASIC METABOLIC PANEL
Anion gap: 9 (ref 5–15)
BUN: 27 mg/dL — ABNORMAL HIGH (ref 8–23)
CO2: 22 mmol/L (ref 22–32)
Calcium: 8.3 mg/dL — ABNORMAL LOW (ref 8.9–10.3)
Chloride: 103 mmol/L (ref 98–111)
Creatinine, Ser: 0.66 mg/dL (ref 0.44–1.00)
GFR, Estimated: >60 mL/min (ref >60)
Glucose, Bld: 230 mg/dL — ABNORMAL HIGH (ref 70–99)
Potassium: 4.2 mmol/L (ref 3.5–5.1)
Sodium: 134 mmol/L — ABNORMAL LOW (ref 135–145)

## 2022-12-03 LAB — MISC LABCORP TEST (SEND OUT): Labcorp test code: 9985

## 2022-12-03 LAB — GLUCOSE, CAPILLARY
Glucose-Capillary: 138 mg/dL — ABNORMAL HIGH (ref 70–99)
Glucose-Capillary: 180 mg/dL — ABNORMAL HIGH (ref 70–99)
Glucose-Capillary: 156 mg/dL — ABNORMAL HIGH (ref 70–99)
Glucose-Capillary: 246 mg/dL — ABNORMAL HIGH (ref 70–99)

## 2022-12-03 NOTE — Progress Notes (Signed)
Patient seen and examined, no changes from my note yesterday, -Plan for discharge to CIR today if bed available -Remains medically stable  Domenic Polite, MD

## 2022-12-03 NOTE — Progress Notes (Signed)
IP rehab admissions - We do have approval for CIR admission.  No bed available today on CIR for this patient.  I will have my partner follow up tomorrow for bed availability and potential admission.  Call for questions.  [phone]

## 2022-12-03 NOTE — Progress Notes (Addendum)
Physical Therapy Treatment Patient Details Name: Llana Deshazo MRN: [id number] DOB: 29-Nov-1950 Today's Date: 12/03/2022   History of Present Illness 72 yo female admitted 1/11 for Acute on chronic diastolic CHF. Past medical history of hypertension, T2DM, hypertension, SAH in 2007    PT Comments    Pt making steady progress with mobility and eventually was able to achieve full standing with use of Stedy and 2 person assist. Continue to recommend acute inpatient rehab (AIR) for post-acute therapy needs.    Recommendations for follow up therapy are one component of a multi-disciplinary discharge planning process, led by the attending physician.  Recommendations may be updated based on patient status, additional functional criteria and insurance authorization.  Follow Up Recommendations  Acute inpatient rehab (3hours/day)     Assistance Recommended at Discharge Frequent or constant Supervision/Assistance  Patient can return home with the following Two people to help with walking and/or transfers;Two people to help with bathing/dressing/bathroom   Equipment Recommendations  Other (comment) (To be determined)    Recommendations for Other Services       Precautions / Restrictions Precautions Precautions: Fall Precaution Comments: Reports several falls Restrictions Weight Bearing Restrictions: No Other Position/Activity Restrictions: painful R shoulder with limited ROM     Mobility  Bed Mobility Overal bed mobility: Needs Assistance Bed Mobility: Supine to Sit     Supine to sit: Max assist, +2 for safety/equipment     General bed mobility comments: Assist to bring legs off of bed, elevate trunk into sitting and bring hips to EOB    Transfers Overall transfer level: Needs assistance Equipment used: Rolling walker (2 wheels), Ambulation equipment used Transfers: Sit to/from Stand, Bed to chair/wheelchair/BSC Sit to Stand: Max assist, From elevated surface, +2 physical  assistance, Mod assist           General transfer comment: Attempted x 3 times to stand from bed with +2 max assist with walker and only able to achieve partial stand. Pt fearful of leaning anteriorly to bring hips up. Used Stedy and pt able to stand with +2 max assist to bring hips up from bed. Verbal and tactile cues to fully extend hips and bring abdomen forward toward bar of Stedy. From [facility] seat pt able to stand with mod assist. Unable to stand from low recliner with Stedy. Transfer via Lift Equipment: Stedy  Ambulation/Gait                   Stairs             Wheelchair Mobility    Modified Rankin (Stroke Patients Only)       Balance Overall balance assessment: Needs assistance Sitting-balance support: Feet supported, Single extremity supported, Bilateral upper extremity supported Sitting balance-Leahy Scale: Poor Sitting balance - Comments: Intermittent posterior lean Postural control: Posterior lean Standing balance support: Bilateral upper extremity supported, Reliant on assistive device for balance Standing balance-Leahy Scale: Poor Standing balance comment: Once full standing in Stedy achieved pt able to maintain for 60 secs with min assist                            Cognition Arousal/Alertness: Awake/alert Behavior During Therapy: WFL for tasks assessed/performed Overall Cognitive Status: Within Functional Limits for tasks assessed  Exercises      General Comments        Pertinent Vitals/Pain Pain Assessment Pain Assessment: Faces Faces Pain Scale: Hurts even more Pain Location: R shoulder with movement Pain Descriptors / Indicators: Discomfort, Grimacing, Guarding Pain Intervention(s): Limited activity within patient's tolerance, Monitored during session, Repositioned    Home Living                          Prior Function            PT Goals (current  goals can now be found in the care plan section) Progress towards PT goals: Progressing toward goals    Frequency    Min 3X/week      PT Plan Current plan remains appropriate    Co-evaluation              AM-PAC PT "6 Clicks" Mobility   Outcome Measure  Help needed turning from your back to your side while in a flat bed without using bedrails?: A Lot Help needed moving from lying on your back to sitting on the side of a flat bed without using bedrails?: A Lot Help needed moving to and from a bed to a chair (including a wheelchair)?: Total Help needed standing up from a chair using your arms (e.g., wheelchair or bedside chair)?: Total Help needed to walk in hospital room?: Total Help needed climbing 3-5 steps with a railing? : Total 6 Click Score: 8    End of Session   Activity Tolerance: Patient tolerated treatment well Patient left: in chair;with call bell/phone within reach;with chair alarm set Nurse Communication: Mobility status;Need for lift equipment (maximove pad in chair for return to bed) PT Visit Diagnosis: Muscle weakness (generalized) (M62.81);Other abnormalities of gait and mobility (R26.89);Pain;Repeated falls (R29.6) Pain - Right/Left: Right Pain - part of body: Shoulder     Time: 1610-9604 PT Time Calculation (min) (ACUTE ONLY): 28 min  Charges:  $Therapeutic Activity: 23-37 mins                     [place] [facility] [place] 12/03/2022, 2:05 PM

## 2022-12-04 ENCOUNTER — Encounter (HOSPITAL_COMMUNITY): Payer: Self-pay | Admitting: Internal Medicine

## 2022-12-04 ENCOUNTER — Other Ambulatory Visit: Payer: Self-pay

## 2022-12-04 ENCOUNTER — Encounter (HOSPITAL_COMMUNITY): Payer: Self-pay | Admitting: Physical Medicine & Rehabilitation

## 2022-12-04 ENCOUNTER — Inpatient Hospital Stay (HOSPITAL_COMMUNITY)
Admission: RE | Admit: 2022-12-04 | Discharge: 2023-01-06 | DRG: 945 | Disposition: A | Discharge status: Home Health | Payer: Medicare Other | Source: Intra-hospital | Attending: Physical Medicine & Rehabilitation | Admitting: Physical Medicine & Rehabilitation

## 2022-12-04 DIAGNOSIS — M19011 Primary osteoarthritis, right shoulder: Secondary | ICD-10-CM | POA: Diagnosis not present

## 2022-12-04 DIAGNOSIS — M25511 Pain in right shoulder: Secondary | ICD-10-CM | POA: Diagnosis not present

## 2022-12-04 DIAGNOSIS — Z79899 Other long term (current) drug therapy: Secondary | ICD-10-CM

## 2022-12-04 DIAGNOSIS — I5033 Acute on chronic diastolic (congestive) heart failure: Secondary | ICD-10-CM | POA: Diagnosis not present

## 2022-12-04 DIAGNOSIS — T502X5A Adverse effect of carbonic-anhydrase inhibitors, benzothiadiazides and other diuretics, initial encounter: Secondary | ICD-10-CM | POA: Diagnosis not present

## 2022-12-04 DIAGNOSIS — N3000 Acute cystitis without hematuria: Secondary | ICD-10-CM

## 2022-12-04 DIAGNOSIS — I10 Essential (primary) hypertension: Secondary | ICD-10-CM | POA: Diagnosis not present

## 2022-12-04 DIAGNOSIS — M1990 Unspecified osteoarthritis, unspecified site: Secondary | ICD-10-CM | POA: Diagnosis present

## 2022-12-04 DIAGNOSIS — M25562 Pain in left knee: Secondary | ICD-10-CM | POA: Diagnosis not present

## 2022-12-04 DIAGNOSIS — G8929 Other chronic pain: Secondary | ICD-10-CM | POA: Diagnosis present

## 2022-12-04 DIAGNOSIS — M1711 Unilateral primary osteoarthritis, right knee: Secondary | ICD-10-CM | POA: Diagnosis not present

## 2022-12-04 DIAGNOSIS — H409 Unspecified glaucoma: Secondary | ICD-10-CM | POA: Diagnosis not present

## 2022-12-04 DIAGNOSIS — F411 Generalized anxiety disorder: Secondary | ICD-10-CM | POA: Insufficient documentation

## 2022-12-04 DIAGNOSIS — R0902 Hypoxemia: Secondary | ICD-10-CM | POA: Diagnosis present

## 2022-12-04 DIAGNOSIS — E875 Hyperkalemia: Secondary | ICD-10-CM | POA: Diagnosis not present

## 2022-12-04 DIAGNOSIS — E1069 Type 1 diabetes mellitus with other specified complication: Secondary | ICD-10-CM | POA: Diagnosis not present

## 2022-12-04 DIAGNOSIS — Z1611 Resistance to penicillins: Secondary | ICD-10-CM | POA: Diagnosis not present

## 2022-12-04 DIAGNOSIS — H5461 Unqualified visual loss, right eye, normal vision left eye: Secondary | ICD-10-CM | POA: Diagnosis not present

## 2022-12-04 DIAGNOSIS — Z794 Long term (current) use of insulin: Secondary | ICD-10-CM

## 2022-12-04 DIAGNOSIS — N3944 Nocturnal enuresis: Secondary | ICD-10-CM | POA: Diagnosis present

## 2022-12-04 DIAGNOSIS — R5381 Other malaise: Principal | ICD-10-CM | POA: Diagnosis present

## 2022-12-04 DIAGNOSIS — B9620 Unspecified Escherichia coli [E. coli] as the cause of diseases classified elsewhere: Secondary | ICD-10-CM | POA: Diagnosis not present

## 2022-12-04 DIAGNOSIS — N3281 Overactive bladder: Secondary | ICD-10-CM | POA: Diagnosis present

## 2022-12-04 DIAGNOSIS — Z96651 Presence of right artificial knee joint: Secondary | ICD-10-CM | POA: Diagnosis present

## 2022-12-04 DIAGNOSIS — I4891 Unspecified atrial fibrillation: Secondary | ICD-10-CM | POA: Diagnosis not present

## 2022-12-04 DIAGNOSIS — I110 Hypertensive heart disease with heart failure: Secondary | ICD-10-CM | POA: Diagnosis not present

## 2022-12-04 DIAGNOSIS — G4700 Insomnia, unspecified: Secondary | ICD-10-CM | POA: Diagnosis not present

## 2022-12-04 DIAGNOSIS — J069 Acute upper respiratory infection, unspecified: Secondary | ICD-10-CM | POA: Diagnosis not present

## 2022-12-04 DIAGNOSIS — M19042 Primary osteoarthritis, left hand: Secondary | ICD-10-CM | POA: Diagnosis present

## 2022-12-04 DIAGNOSIS — E785 Hyperlipidemia, unspecified: Secondary | ICD-10-CM | POA: Diagnosis not present

## 2022-12-04 DIAGNOSIS — R197 Diarrhea, unspecified: Secondary | ICD-10-CM | POA: Diagnosis not present

## 2022-12-04 DIAGNOSIS — E109 Type 1 diabetes mellitus without complications: Secondary | ICD-10-CM | POA: Diagnosis present

## 2022-12-04 DIAGNOSIS — M21371 Foot drop, right foot: Secondary | ICD-10-CM | POA: Diagnosis not present

## 2022-12-04 DIAGNOSIS — Z8673 Personal history of transient ischemic attack (TIA), and cerebral infarction without residual deficits: Secondary | ICD-10-CM

## 2022-12-04 DIAGNOSIS — I5032 Chronic diastolic (congestive) heart failure: Secondary | ICD-10-CM

## 2022-12-04 DIAGNOSIS — E876 Hypokalemia: Secondary | ICD-10-CM | POA: Diagnosis not present

## 2022-12-04 DIAGNOSIS — N390 Urinary tract infection, site not specified: Secondary | ICD-10-CM | POA: Diagnosis not present

## 2022-12-04 DIAGNOSIS — E10649 Type 1 diabetes mellitus with hypoglycemia without coma: Secondary | ICD-10-CM | POA: Diagnosis not present

## 2022-12-04 DIAGNOSIS — Z7983 Long term (current) use of bisphosphonates: Secondary | ICD-10-CM

## 2022-12-04 DIAGNOSIS — K5900 Constipation, unspecified: Secondary | ICD-10-CM

## 2022-12-04 DIAGNOSIS — M1712 Unilateral primary osteoarthritis, left knee: Secondary | ICD-10-CM | POA: Diagnosis not present

## 2022-12-04 DIAGNOSIS — R2681 Unsteadiness on feet: Secondary | ICD-10-CM | POA: Diagnosis not present

## 2022-12-04 DIAGNOSIS — K5901 Slow transit constipation: Secondary | ICD-10-CM | POA: Diagnosis not present

## 2022-12-04 DIAGNOSIS — E1169 Type 2 diabetes mellitus with other specified complication: Secondary | ICD-10-CM | POA: Diagnosis not present

## 2022-12-04 DIAGNOSIS — E1065 Type 1 diabetes mellitus with hyperglycemia: Secondary | ICD-10-CM

## 2022-12-04 DIAGNOSIS — F419 Anxiety disorder, unspecified: Secondary | ICD-10-CM | POA: Diagnosis not present

## 2022-12-04 DIAGNOSIS — R42 Dizziness and giddiness: Secondary | ICD-10-CM | POA: Diagnosis not present

## 2022-12-04 DIAGNOSIS — Z888 Allergy status to other drugs, medicaments and biological substances status: Secondary | ICD-10-CM

## 2022-12-04 DIAGNOSIS — M19041 Primary osteoarthritis, right hand: Secondary | ICD-10-CM | POA: Diagnosis present

## 2022-12-04 DIAGNOSIS — Z8249 Family history of ischemic heart disease and other diseases of the circulatory system: Secondary | ICD-10-CM

## 2022-12-04 DIAGNOSIS — N179 Acute kidney failure, unspecified: Secondary | ICD-10-CM | POA: Diagnosis not present

## 2022-12-04 DIAGNOSIS — Z885 Allergy status to narcotic agent status: Secondary | ICD-10-CM

## 2022-12-04 LAB — GLUCOSE, CAPILLARY
Glucose-Capillary: 190 mg/dL — ABNORMAL HIGH (ref 70–99)
Glucose-Capillary: 227 mg/dL — ABNORMAL HIGH (ref 70–99)
Glucose-Capillary: 212 mg/dL — ABNORMAL HIGH (ref 70–99)
Glucose-Capillary: 198 mg/dL — ABNORMAL HIGH (ref 70–99)

## 2022-12-04 MED ORDER — DILTIAZEM HCL ER COATED BEADS 180 MG PO CP24
180.0000 mg | ORAL_CAPSULE | Freq: Every day | ORAL | Status: DC
  Administered 2022-12-05 – 2023-01-06 (×33): 180 mg via ORAL
  Filled 2022-12-04 (×33): qty 1

## 2022-12-04 MED ORDER — PHENOL 1.4 % MT LIQD: 1.0000 | OROMUCOSAL | Status: DC | PRN

## 2022-12-04 MED ORDER — ENOXAPARIN SODIUM 40 MG/0.4ML IJ SOSY
40.0000 mg | PREFILLED_SYRINGE | INTRAMUSCULAR | Status: DC
  Administered 2022-12-04 – 2023-01-05 (×33): 40 mg via SUBCUTANEOUS
  Filled 2022-12-04 (×33): qty 0.4

## 2022-12-04 MED ORDER — ACETAMINOPHEN 325 MG PO TABS
325.0000 mg | ORAL_TABLET | ORAL | Status: DC | PRN
  Administered 2022-12-09 – 2022-12-11 (×2): 650 mg via ORAL
  Administered 2022-12-22: 325 mg via ORAL
  Administered 2022-12-25: 650 mg via ORAL
  Administered 2022-12-31: 325 mg via ORAL
  Administered 2023-01-01: 650 mg via ORAL
  Filled 2022-12-04 (×8): qty 2

## 2022-12-04 MED ORDER — ASPIRIN 325 MG PO TBEC
325.0000 mg | DELAYED_RELEASE_TABLET | Freq: Every day | ORAL | Status: DC
  Administered 2022-12-05 – 2023-01-06 (×33): 325 mg via ORAL
  Filled 2022-12-04 (×33): qty 1

## 2022-12-04 MED ORDER — PROCHLORPERAZINE MALEATE 5 MG PO TABS
5.0000 mg | ORAL_TABLET | Freq: Four times a day (QID) | ORAL | Status: DC | PRN
  Administered 2022-12-07: 5 mg via ORAL
  Administered 2022-12-10: 10 mg via ORAL
  Administered 2023-01-03: 5 mg via ORAL
  Filled 2022-12-04: qty 2
  Filled 2022-12-04: qty 1
  Filled 2022-12-04: qty 2

## 2022-12-04 MED ORDER — SPIRONOLACTONE 12.5 MG HALF TABLET
12.5000 mg | ORAL_TABLET | Freq: Every day | ORAL | Status: DC
  Administered 2022-12-05 – 2022-12-28 (×24): 12.5 mg via ORAL
  Filled 2022-12-04 (×24): qty 1

## 2022-12-04 MED ORDER — LOSARTAN POTASSIUM 25 MG PO TABS: 25.0000 mg | ORAL_TABLET | Freq: Every day | ORAL | Status: DC

## 2022-12-04 MED ORDER — METOPROLOL SUCCINATE ER 100 MG PO TB24: 100.0000 mg | ORAL_TABLET | Freq: Every day | ORAL | Status: DC

## 2022-12-04 MED ORDER — OFF THE BEAT BOOK
Freq: Once | Status: AC
  Filled 2022-12-04: qty 1

## 2022-12-04 MED ORDER — ALBUTEROL SULFATE HFA 108 (90 BASE) MCG/ACT IN AERS: 2.0000 | INHALATION_SPRAY | Freq: Four times a day (QID) | RESPIRATORY_TRACT | Status: DC | PRN

## 2022-12-04 MED ORDER — MIRABEGRON ER 50 MG PO TB24
50.0000 mg | ORAL_TABLET | Freq: Every day | ORAL | Status: DC
  Administered 2022-12-05 – 2023-01-06 (×33): 50 mg via ORAL
  Filled 2022-12-04 (×33): qty 1

## 2022-12-04 MED ORDER — HYDROCERIN EX CREA
TOPICAL_CREAM | Freq: Two times a day (BID) | CUTANEOUS | Status: DC
  Administered 2022-12-04 – 2022-12-06 (×3): 1 via TOPICAL
  Filled 2022-12-04 (×2): qty 113

## 2022-12-04 MED ORDER — SPIRONOLACTONE 25 MG PO TABS: 12.5000 mg | ORAL_TABLET | Freq: Every day | ORAL | Status: DC

## 2022-12-04 MED ORDER — DIPHENHYDRAMINE HCL 12.5 MG/5ML PO ELIX
12.5000 mg | ORAL_SOLUTION | Freq: Four times a day (QID) | ORAL | Status: DC | PRN
  Administered 2022-12-11: 12.5 mg via ORAL
  Filled 2022-12-04: qty 10

## 2022-12-04 MED ORDER — POLYETHYLENE GLYCOL 3350 17 G PO PACK
17.0000 g | PACK | Freq: Every day | ORAL | Status: DC | PRN
  Filled 2022-12-04: qty 1

## 2022-12-04 MED ORDER — LIVING BETTER WITH HEART FAILURE BOOK: Freq: Once | Status: AC

## 2022-12-04 MED ORDER — PROCHLORPERAZINE EDISYLATE 10 MG/2ML IJ SOLN: 5.0000 mg | Freq: Four times a day (QID) | INTRAMUSCULAR | Status: DC | PRN

## 2022-12-04 MED ORDER — GUAIFENESIN 200 MG PO TABS: 200.0000 mg | ORAL_TABLET | ORAL | Status: DC | PRN

## 2022-12-04 MED ORDER — FLEET ENEMA 7-19 GM/118ML RE ENEM: 1.0000 | ENEMA | Freq: Once | RECTAL | Status: DC | PRN

## 2022-12-04 MED ORDER — GABAPENTIN 100 MG PO CAPS
100.0000 mg | ORAL_CAPSULE | Freq: Every day | ORAL | Status: DC
  Administered 2022-12-04 – 2023-01-05 (×33): 100 mg via ORAL
  Filled 2022-12-04 (×34): qty 1

## 2022-12-04 MED ORDER — TRAZODONE HCL 50 MG PO TABS
25.0000 mg | ORAL_TABLET | Freq: Every evening | ORAL | Status: DC | PRN
  Administered 2022-12-04: 25 mg via ORAL
  Administered 2022-12-05 – 2022-12-29 (×5): 50 mg via ORAL
  Filled 2022-12-04 (×9): qty 1

## 2022-12-04 MED ORDER — BISACODYL 10 MG RE SUPP: 10.0000 mg | Freq: Every day | RECTAL | Status: DC | PRN

## 2022-12-04 MED ORDER — ALBUTEROL SULFATE (2.5 MG/3ML) 0.083% IN NEBU: 2.5000 mg | INHALATION_SOLUTION | Freq: Four times a day (QID) | RESPIRATORY_TRACT | Status: DC | PRN

## 2022-12-04 MED ORDER — INSULIN GLARGINE-YFGN 100 UNIT/ML ~~LOC~~ SOLN
15.0000 [IU] | Freq: Every day | SUBCUTANEOUS | Status: DC
  Administered 2022-12-04: 15 [IU] via SUBCUTANEOUS
  Filled 2022-12-04 (×2): qty 0.15

## 2022-12-04 MED ORDER — DICLOFENAC SODIUM 1 % EX GEL
2.0000 g | Freq: Four times a day (QID) | CUTANEOUS | Status: DC
  Administered 2022-12-04 – 2023-01-05 (×98): 2 g via TOPICAL
  Filled 2022-12-04 (×2): qty 100

## 2022-12-04 MED ORDER — LOSARTAN POTASSIUM 25 MG PO TABS
25.0000 mg | ORAL_TABLET | Freq: Every day | ORAL | Status: DC
  Administered 2022-12-05 – 2022-12-08 (×4): 25 mg via ORAL
  Filled 2022-12-04 (×4): qty 1

## 2022-12-04 MED ORDER — LATANOPROST 0.005 % OP SOLN
1.0000 [drp] | Freq: Every day | OPHTHALMIC | Status: DC
  Administered 2022-12-04 – 2023-01-05 (×33): 1 [drp] via OPHTHALMIC
  Filled 2022-12-04 (×2): qty 2.5

## 2022-12-04 MED ORDER — OXYCODONE-ACETAMINOPHEN 5-325 MG PO TABS
1.0000 | ORAL_TABLET | Freq: Four times a day (QID) | ORAL | Status: DC | PRN
  Administered 2022-12-05 – 2023-01-05 (×40): 1 via ORAL
  Filled 2022-12-04 (×41): qty 1

## 2022-12-04 MED ORDER — GUAIFENESIN-DM 100-10 MG/5ML PO SYRP: 5.0000 mL | ORAL_SOLUTION | Freq: Four times a day (QID) | ORAL | Status: DC | PRN

## 2022-12-04 MED ORDER — ALUM & MAG HYDROXIDE-SIMETH 200-200-20 MG/5ML PO SUSP: 30.0000 mL | ORAL | Status: DC | PRN

## 2022-12-04 MED ORDER — FUROSEMIDE 40 MG PO TABS
40.0000 mg | ORAL_TABLET | Freq: Every day | ORAL | Status: DC
  Administered 2022-12-05 – 2023-01-06 (×33): 40 mg via ORAL
  Filled 2022-12-04 (×33): qty 1

## 2022-12-04 MED ORDER — NORTRIPTYLINE HCL 10 MG PO CAPS
10.0000 mg | ORAL_CAPSULE | Freq: Every day | ORAL | Status: DC
  Administered 2022-12-04 – 2023-01-05 (×33): 10 mg via ORAL
  Filled 2022-12-04 (×35): qty 1

## 2022-12-04 MED ORDER — INSULIN ASPART 100 UNIT/ML IJ SOLN
0.0000 [IU] | Freq: Three times a day (TID) | INTRAMUSCULAR | Status: DC
  Administered 2022-12-04: 7 [IU] via SUBCUTANEOUS
  Administered 2022-12-05: 4 [IU] via SUBCUTANEOUS
  Administered 2022-12-05: 7 [IU] via SUBCUTANEOUS
  Administered 2022-12-05 – 2022-12-07 (×4): 4 [IU] via SUBCUTANEOUS
  Administered 2022-12-07: 2 [IU] via SUBCUTANEOUS
  Administered 2022-12-08: 7 [IU] via SUBCUTANEOUS
  Administered 2022-12-08: 11 [IU] via SUBCUTANEOUS
  Administered 2022-12-08: 4 [IU] via SUBCUTANEOUS
  Administered 2022-12-09 (×2): 7 [IU] via SUBCUTANEOUS
  Administered 2022-12-10: 4 [IU] via SUBCUTANEOUS
  Administered 2022-12-10: 3 [IU] via SUBCUTANEOUS
  Administered 2022-12-11: 4 [IU] via SUBCUTANEOUS

## 2022-12-04 MED ORDER — ASPIRIN 325 MG PO TABS: 325.0000 mg | ORAL_TABLET | Freq: Every day | ORAL | Status: DC

## 2022-12-04 MED ORDER — FUROSEMIDE 40 MG PO TABS: 40.0000 mg | ORAL_TABLET | Freq: Every day | ORAL | Status: DC

## 2022-12-04 MED ORDER — PRAVASTATIN SODIUM 40 MG PO TABS
40.0000 mg | ORAL_TABLET | Freq: Every day | ORAL | Status: DC
  Administered 2022-12-04 – 2023-01-05 (×33): 40 mg via ORAL
  Filled 2022-12-04 (×33): qty 1

## 2022-12-04 MED ORDER — METOPROLOL SUCCINATE ER 50 MG PO TB24
100.0000 mg | ORAL_TABLET | Freq: Every day | ORAL | Status: DC
  Administered 2022-12-05 – 2023-01-06 (×33): 100 mg via ORAL
  Filled 2022-12-04 (×34): qty 2

## 2022-12-04 MED ORDER — PROCHLORPERAZINE 25 MG RE SUPP: 12.5000 mg | Freq: Four times a day (QID) | RECTAL | Status: DC | PRN

## 2022-12-04 MED ORDER — DILTIAZEM HCL ER COATED BEADS 180 MG PO CP24: 180.0000 mg | ORAL_CAPSULE | Freq: Every day | ORAL | Status: DC

## 2022-12-04 MED ORDER — ONDANSETRON HCL 4 MG/2ML IJ SOLN: 4.0000 mg | Freq: Four times a day (QID) | INTRAMUSCULAR | Status: DC | PRN

## 2022-12-04 MED ORDER — TRAMADOL HCL 50 MG PO TABS
50.0000 mg | ORAL_TABLET | Freq: Two times a day (BID) | ORAL | Status: DC | PRN
  Administered 2022-12-07 – 2022-12-29 (×2): 50 mg via ORAL
  Filled 2022-12-04 (×2): qty 1

## 2022-12-04 MED ORDER — INSULIN ASPART 100 UNIT/ML IJ SOLN
0.0000 [IU] | Freq: Every day | INTRAMUSCULAR | Status: DC
  Administered 2022-12-05 – 2022-12-06 (×2): 3 [IU] via SUBCUTANEOUS

## 2022-12-04 NOTE — Discharge Summary (Signed)
Physician Discharge Summary  [name] GEX:[id number] DOB: 1951/05/23 DOA: 11/26/2022  PCP: Garlan Fillers, MD  Admit date: 11/26/2022 Discharge date: 12/04/2022  Time spent: 45 minutes  Recommendations for Outpatient Follow-up:  CHF TOC clinic in 2 weeks PCP Dr. Jarold Motto in 1 to 2 weeks, please check BMP at follow-up   Discharge Diagnoses:  Principal Problem:   Acute on chronic diastolic CHF (congestive heart failure) (HCC) Atrial fibrillation with RVR   AKI (acute kidney injury) (HCC)   Essential hypertension Type 2 diabetes mellitus   Class 2 obesity History of subarachnoid hemorrhage  Discharge Condition: Improved  Diet recommendation: Low-sodium, diabetic  Filed Weights   12/02/22 0534 12/03/22 0348 12/04/22 0500  Weight: 100.2 kg 98.5 kg 98 kg    History of present illness:  71/F w hypertension, T2DM, hypertension, SAH in 2007, who presented with 3 to 4 weeks of dry cough and dyspnea on exertion, > developed orthopnea and palpitations. In ED, BP 99/75, HR 124, + rales bilaterally, Tachypnea, AFib RVR  -CXR w/cardiomegaly, bilateral hilar vascular congestion, positive moderate right pleural effusion with opacity at the right base,CTA no PE, positive right pleural effusion with compression atelectasis, positive bilateral ground glass opacities.   Hospital Course:   Acute on chronic diastolic CHF  -Echo EF 65 to 70%, RV systolic function with mild reduction. Mild dilatated RV cavity.  -diuresed with IV lasix with good effect, she is 11.4 L negative, transition to oral diuretics -now on spironolactone, losartan, metoprolol succinate and furosemide.  No SGLT 2 inh due to type 1 DM.  -Discharge planning, CIR today  -CHF TOC clinic follow-up arranged   Acute hypoxemic respiratory failure  -from CHF/Right pleural effusion -Sp thoracentesis (transudate per fluid analysis).  -weaned off O2   A-fib w/ RVR -now on metoprolol 100 mg succinate.  And Cardizem -Patient  has a history of subarachnoid hemorrhage -Old records reviewed, neurology visit in 2013 reported history of perimesencephalic subarachnoid hemorrhage.  2007 MRI with subarachnoid hemorrhage layering along the right anterolateral aspect of he brain stem, within the paramesencephalic cistern and along the right temporal and parietal sulci.Interventricular hemorrhage with fluid -fluid levels in the ventricles bilaterally. No focal lesion to explain patient's hemorrhage.  Considering this past findings will hold on anticoagulation for now.  Add aspirin -unable to consider DCCV without anticoagulation -Cardiology follow-up arranged   AKI (acute kidney injury) (HCC) Hyperkalemia. -improved   Essential hypertension Continue metoprolol, losartan and spironolactone.    Type 1 diabetes mellitus on insulin therapy (HCC) -Basal insulin 10 units. (Home dose).  Will increase dose -SSI   Class 2 obesity Calculated BMI is 38,1  Ambulatory dysfunction with bilateral knee osteoarthritis.    Right shoulder osteoarthritis. -X-ray with postsurgical changes, no acute findings Continue pain control and outpatient follow up with orthopedics.   Discharge Exam: Vitals:   12/04/22 0851 12/04/22 1113  BP: (!) 160/65 (!) 150/74  Pulse: 93 93  Resp: 20 20  Temp: 98.4 F (36.9 C) 98.3 F (36.8 C)  SpO2: 95% 92%  Obese pleasant female sitting up in bed, AAOx3 CVS: S1-S2, irregularly irregular rhythm Lungs: Clear bilaterally Abdomen: Soft, nontender, bowel sounds present Extremities: No edema   Discharge Instructions   Discharge Instructions     Ambulatory referral to Cardiology   Complete by: As directed    New Afib      Allergies as of 12/04/2022       Reactions   Codeine    Tape  Medication List     STOP taking these medications    celecoxib 100 MG capsule Commonly known as: CELEBREX   metoprolol tartrate 25 MG tablet Commonly known as: LOPRESSOR    oxyCODONE-acetaminophen 5-325 MG tablet Commonly known as: PERCOCET/ROXICET       TAKE these medications    alendronate 70 MG tablet Commonly known as: FOSAMAX Take 70 mg by mouth once a week.   ascorbic acid 500 MG tablet Commonly known as: VITAMIN C Take 500 mg by mouth daily.   aspirin 325 MG tablet Take 1 tablet (325 mg total) by mouth daily. Start taking on: December 05, 2022   calcium-vitamin D 250-100 MG-UNIT tablet Take 1 tablet by mouth 2 (two) times daily.   Centrum Silver 50+Women Tabs Take 1 tablet by mouth daily.   Co Q 10 100 MG Caps Take 100 mg by mouth daily.   diltiazem 180 MG 24 hr capsule Commonly known as: CARDIZEM CD Take 1 capsule (180 mg total) by mouth daily. Start taking on: December 05, 2022   fish oil-omega-3 fatty acids 1000 MG capsule Take 2 g by mouth daily.   furosemide 40 MG tablet Commonly known as: LASIX Take 1 tablet (40 mg total) by mouth daily. Start taking on: December 05, 2022   gabapentin 100 MG capsule Commonly known as: NEURONTIN Take 100 mg by mouth at bedtime.   insulin glargine 100 UNIT/ML injection Commonly known as: LANTUS Inject 10 Units into the skin at bedtime.   insulin lispro 100 UNIT/ML injection Commonly known as: HUMALOG Inject into the skin 3 (three) times daily before meals.   latanoprost 0.005 % ophthalmic solution Commonly known as: XALATAN 1 drop at bedtime.   losartan 25 MG tablet Commonly known as: COZAAR Take 1 tablet (25 mg total) by mouth daily. Start taking on: December 05, 2022 What changed:  medication strength how much to take   metoprolol succinate 100 MG 24 hr tablet Commonly known as: TOPROL-XL Take 1 tablet (100 mg total) by mouth daily. Take with or immediately following a meal. Start taking on: December 05, 2022   Myrbetriq 50 MG Tb24 tablet Generic drug: mirabegron ER Take 50 mg by mouth daily.   ondansetron 4 MG disintegrating tablet Commonly known as: ZOFRAN-ODT Take  1 tablet (4 mg total) by mouth every 8 (eight) hours as needed for nausea or vomiting.   OneTouch Ultra test strip Generic drug: glucose blood 1 each 4 (four) times daily.   pravastatin 40 MG tablet Commonly known as: PRAVACHOL Take 40 mg by mouth daily.   PROBIOTIC PO Take by mouth daily.   RELION INSULIN SYR 0.3ML/31G 31G X 5/16" 0.3 ML Misc Generic drug: Insulin Syringe-Needle U-100 USE 4 SYRINGES DAILY   spironolactone 25 MG tablet Commonly known as: ALDACTONE Take 0.5 tablets (12.5 mg total) by mouth daily. Start taking on: December 05, 2022   STOOL SOFTENER PO Take by mouth daily.   traMADol 50 MG tablet Commonly known as: ULTRAM Take 50 mg by mouth 2 (two) times daily as needed.       Allergies  Allergen Reactions   Codeine    Tape       The results of significant diagnostics from this hospitalization (including imaging, microbiology, ancillary and laboratory) are listed below for reference.    Significant Diagnostic Studies: DG Shoulder Right  Result Date: 11/30/2022 CLINICAL DATA:  Right shoulder pain EXAM: RIGHT SHOULDER - 2+ VIEW COMPARISON:  10/20/2021 FINDINGS: Postsurgical changes are noted in  the proximal right humerus with deformity identified. Underlying bony thorax appears within normal limits. No fracture or dislocation is seen. Stable bone island is noted in the mid humerus. No new focal abnormality is seen. IMPRESSION: Postsurgical  changes without acute abnormality. Electronically Signed   By: Alcide CleverMark  Lukens M.D.   On: 11/30/2022 19:31   IR THORACENTESIS ASP PLEURAL SPACE W/IMG GUIDE  Result Date: 11/27/2022 INDICATION: Shortness of breath. COVID pneumonia. Right-sided pleural effusion. Request for diagnostic and therapeutic thoracentesis. EXAM: ULTRASOUND GUIDED RIGHT THORACENTESIS MEDICATIONS: 1% plain lidocaine, 7 mL COMPLICATIONS: None immediate. PROCEDURE: An ultrasound guided thoracentesis was thoroughly discussed with the patient and  questions answered. The benefits, risks, alternatives and complications were also discussed. The patient understands and wishes to proceed with the procedure. Written consent was obtained. Ultrasound was performed to localize and mark an adequate pocket of fluid in the right chest. The area was then prepped and draped in the normal sterile fashion. 1% Lidocaine was used for local anesthesia. Under ultrasound guidance a 6 Fr Safe-T-Centesis catheter was introduced. Thoracentesis was performed. The catheter was removed and a dressing applied. FINDINGS: A total of approximately 1.3 L of hazy yellow fluid was removed. Samples were sent to the laboratory as requested by the clinical team. IMPRESSION: Successful ultrasound guided right thoracentesis yielding 1.3 L of pleural fluid. Read by: Brayton ElKevin Bruning PA-C Electronically Signed   By: Malachy MoanHeath  McCullough M.D.   On: 11/27/2022 13:38   VAS US LOWER EXTREMITY VENOUS (DVT)  Result Date: 11/27/2022  Lower Venous DVT Study Patient Name:  [name]  Date of Exam:   11/27/2022 Medical Rec #: [id number]    Accession #:    [id number] Date of Birth: 20-Dec-1950    Patient Gender: F Patient Age:   [age] years [facility] Procedure:      VAS US LOWER EXTREMITY VENOUS (DVT) Referring Phys: Mikey CollegePING ZHANG --------------------------------------------------------------------------------  Indications: Pain.  Limitations: Body habitus and poor ultrasound/tissue interface. Comparison Study: no prior Performing Technologist: [place] PonderMegan Stricklin RVS  Examination Guidelines: A complete evaluation includes B-mode imaging, spectral Doppler, color Doppler, and power Doppler as needed of all accessible portions of each vessel. Bilateral testing is considered an integral part of a complete examination. Limited examinations for reoccurring indications may be performed as noted. The reflux portion of the exam is performed with the patient in reverse Trendelenburg.   +--------+---------------+---------+-----------+----------+--------------------+ RIGHT   CompressibilityPhasicitySpontaneityPropertiesThrombus Aging       +--------+---------------+---------+-----------+----------+--------------------+ CFV     Full           Yes      Yes                                       +--------+---------------+---------+-----------+----------+--------------------+ SFJ     Full                                                              +--------+---------------+---------+-----------+----------+--------------------+ FV Prox Full                                                              +--------+---------------+---------+-----------+----------+--------------------+  FV Mid                 Yes      Yes                  patent by color                                                           doppler              +--------+---------------+---------+-----------+----------+--------------------+ FV                     Yes      Yes                  patent by color      Distal                                               doppler              +--------+---------------+---------+-----------+----------+--------------------+ PFV     Full                                                              +--------+---------------+---------+-----------+----------+--------------------+ POP     Full           Yes      Yes                                       +--------+---------------+---------+-----------+----------+--------------------+ PTV                    Yes      Yes                  patent by color                                                           doppler              +--------+---------------+---------+-----------+----------+--------------------+ PERO                   Yes      Yes                  patent by color                                                           doppler               +--------+---------------+---------+-----------+----------+--------------------+   +---------+---------------+---------+-----------+----------+-------------------+  LEFT     CompressibilityPhasicitySpontaneityPropertiesThrombus Aging      +---------+---------------+---------+-----------+----------+-------------------+ CFV      Full           Yes      Yes                                      +---------+---------------+---------+-----------+----------+-------------------+ SFJ      Full                                                             +---------+---------------+---------+-----------+----------+-------------------+ FV Prox  Full                                                             +---------+---------------+---------+-----------+----------+-------------------+ FV Mid   Full                                                             +---------+---------------+---------+-----------+----------+-------------------+ FV DistalFull           Yes      Yes                                      +---------+---------------+---------+-----------+----------+-------------------+ PFV      Full                                                             +---------+---------------+---------+-----------+----------+-------------------+ POP      Full           Yes      Yes                                      +---------+---------------+---------+-----------+----------+-------------------+ PTV      Full                                                             +---------+---------------+---------+-----------+----------+-------------------+ PERO                                                  Not well visualized +---------+---------------+---------+-----------+----------+-------------------+     Summary: RIGHT: - There is no evidence of deep vein thrombosis in the  lower extremity. However, portions of this examination were limited- see technologist  comments above.  - No cystic structure found in the popliteal fossa.  LEFT: - There is no evidence of deep vein thrombosis in the lower extremity.  - No cystic structure found in the popliteal fossa.  *See table(s) above for measurements and observations. Electronically signed by Sherald Hesshristopher Clark MD on 11/27/2022 at 10:53:53 AM.    Final    ECHOCARDIOGRAM COMPLETE  Result Date: 11/27/2022    ECHOCARDIOGRAM REPORT   Patient Name:   [name] Date of Exam: 11/27/2022 Medical Rec #:  [id number]   Height:       67.0 in Accession #:    [id number]  Weight:       197.1 lb Date of Birth:  03-24-1951   BSA:          2.010 m Patient Age:    71 years    BP:           156/85 mmHg Patient Gender: F           HR:           99 bpm. Exam Location:  Inpatient Procedure: 2D Echo, Cardiac Doppler and Color Doppler Indications:    CHF-Acute Diastolic I50.31  History:        Patient has no prior history of Echocardiogram examinations.                 CHF, Arrythmias:Atrial Fibrillation; Risk Factors:Hypertension,                 Diabetes and Dyslipidemia.  Sonographer:    Leta Junglingiffany Cooper RDCS Referring Phys: [id number] Emeline GeneralPING T ZHANG IMPRESSIONS  1. Left ventricular ejection fraction, by estimation, is 65 to 70%. The left ventricle has normal function. The left ventricle has no regional wall motion abnormalities. Left ventricular diastolic function could not be evaluated.  2. Right ventricular systolic function is mildly reduced. The right ventricular size is mildly enlarged. Tricuspid regurgitation signal is inadequate for assessing PA pressure.  3. Left atrial size was mildly dilated.  4. Right atrial size was mildly dilated.  5. The mitral valve is degenerative. Trivial mitral valve regurgitation. Mild mitral stenosis. The mean mitral valve gradient is 4.3 mmHg with average heart rate of 96 bpm. Moderate to severe mitral annular calcification.  6. The aortic valve was not well visualized. Aortic valve regurgitation is trivial. Aortic  valve sclerosis/calcification is present, without any evidence of aortic stenosis. Aortic valve area, by VTI measures 2.01 cm. Aortic valve mean gradient measures 4.0 mmHg. Aortic valve Vmax measures 1.21 m/s.  7. The inferior vena cava is dilated in size with <50% respiratory variability, suggesting right atrial pressure of 15 mmHg. FINDINGS  Left Ventricle: Left ventricular ejection fraction, by estimation, is 65 to 70%. The left ventricle has normal function. The left ventricle has no regional wall motion abnormalities. The left ventricular internal cavity size was normal in size. There is  no left ventricular hypertrophy. Left ventricular diastolic function could not be evaluated due to atrial fibrillation. Left ventricular diastolic function could not be evaluated. Right Ventricle: The right ventricular size is mildly enlarged. No increase in right ventricular wall thickness. Right ventricular systolic function is mildly reduced. Tricuspid regurgitation signal is inadequate for assessing PA pressure. Left Atrium: Left atrial size was mildly dilated. Right Atrium: Right atrial size was mildly dilated. Pericardium: There is no evidence of pericardial effusion. Mitral Valve: The mitral valve is degenerative in appearance. Moderate  to severe mitral annular calcification. Trivial mitral valve regurgitation. Mild mitral valve stenosis. MV peak gradient, 11.3 mmHg. The mean mitral valve gradient is 4.3 mmHg with average heart rate of 96 bpm. Tricuspid Valve: The tricuspid valve is grossly normal. Tricuspid valve regurgitation is mild . No evidence of tricuspid stenosis. Aortic Valve: The aortic valve was not well visualized. Aortic valve regurgitation is trivial. Aortic valve sclerosis/calcification is present, without any evidence of aortic stenosis. Aortic valve mean gradient measures 4.0 mmHg. Aortic valve peak gradient measures 5.9 mmHg. Aortic valve area, by VTI measures 2.01 cm. Pulmonic Valve: The pulmonic  valve was not well visualized. Pulmonic valve regurgitation is not visualized. No evidence of pulmonic stenosis. Aorta: The aortic root is normal in size and structure. Venous: The inferior vena cava is dilated in size with less than 50% respiratory variability, suggesting right atrial pressure of 15 mmHg. IAS/Shunts: The atrial septum is grossly normal. Additional Comments: There is pleural effusion in the right lateral region.  LEFT VENTRICLE PLAX 2D LVIDd:         4.40 cm   Diastology LVIDs:         3.10 cm   LV e' medial:    5.56 cm/s LV PW:         1.00 cm   LV E/e' medial:  27.0 LV IVS:        1.00 cm   LV e' lateral:   4.77 cm/s LVOT diam:     1.60 cm   LV E/e' lateral: 31.5 LV SV:         46 LV SV Index:   23 LVOT Area:     2.01 cm  RIGHT VENTRICLE RV Basal diam:  4.50 cm RV Mid diam:    4.50 cm RV S prime:     10.40 cm/s TAPSE (M-mode): 1.6 cm LEFT ATRIUM             Index        RIGHT ATRIUM           Index LA diam:        4.40 cm 2.19 cm/m   RA Area:     22.90 cm LA Vol (A2C):   94.0 ml 46.77 ml/m  RA Volume:   74.60 ml  37.12 ml/m LA Vol (A4C):   56.2 ml 27.96 ml/m LA Biplane Vol: 71.8 ml 35.73 ml/m  AORTIC VALVE AV Area (Vmax):    1.91 cm AV Area (Vmean):   1.84 cm AV Area (VTI):     2.01 cm AV Vmax:           121.00 cm/s AV Vmean:          89.600 cm/s AV VTI:            0.229 m AV Peak Grad:      5.9 mmHg AV Mean Grad:      4.0 mmHg LVOT Vmax:         115.00 cm/s LVOT Vmean:        81.800 cm/s LVOT VTI:          0.229 m LVOT/AV VTI ratio: 1.00  AORTA Ao Root diam: 2.50 cm MITRAL VALVE MV Area (PHT): 3.48 cm     SHUNTS MV Area VTI:   1.34 cm     Systemic VTI:  0.23 m MV Peak grad:  11.3 mmHg    Systemic Diam: 1.60 cm MV Mean grad:  4.3 mmHg MV Vmax:  1.68 m/s MV Vmean:      90.6 cm/s MV Decel Time: 218 msec MV E velocity: 150.00 cm/s Lennie Odor MD Electronically signed by Lennie Odor MD Signature Date/Time: 11/27/2022/10:47:31 AM    Final    DG Chest Port 1 View  Result Date:  11/27/2022 CLINICAL DATA:  Status post thoracentesis. 1.3 liters from the RIGHT side. EXAM: PORTABLE CHEST 1 VIEW COMPARISON:  11/26/2022 FINDINGS: Interval RIGHT thoracentesis. No significant residual pleural effusion. There is no pneumothorax. Heart is enlarged but stable in configuration. There is atelectasis in the LEFT LOWER lobe. No pulmonary edema. Remote postoperative and degenerative changes in the RIGHT shoulder. IMPRESSION: 1. Interval RIGHT thoracentesis. No pneumothorax. 2. Stable cardiomegaly. Electronically Signed   By: Norva Pavlov M.D.   On: 11/27/2022 10:22   CT Angio Chest PE W and/or Wo Contrast  Result Date: 11/26/2022 CLINICAL DATA:  Shortness of breath. EXAM: CT ANGIOGRAPHY CHEST WITH CONTRAST TECHNIQUE: Multidetector CT imaging of the chest was performed using the standard protocol during bolus administration of intravenous contrast. Multiplanar CT image reconstructions and MIPs were obtained to evaluate the vascular anatomy. RADIATION DOSE REDUCTION: This exam was performed according to the departmental dose-optimization program which includes automated exposure control, adjustment of the mA and/or kV according to patient size and/or use of iterative reconstruction technique. CONTRAST:  79mL OMNIPAQUE IOHEXOL 350 MG/ML SOLN COMPARISON:  None Available. FINDINGS: Cardiovascular: Satisfactory opacification of the pulmonary arteries to the segmental level. No evidence of pulmonary embolism. Normal heart size. No pericardial effusion. Coronary artery calcifications are noted. Mediastinum/Nodes: No enlarged mediastinal, hilar, or axillary lymph nodes. Thyroid gland, trachea, and esophagus demonstrate no significant findings. Lungs/Pleura: No pneumothorax is noted. Moderate size right pleural effusion is noted with associated atelectasis of the right lower lobe. Minimal left pleural effusion is noted with adjacent subsegmental atelectasis. Upper Abdomen: No acute abnormality.  Musculoskeletal: No chest wall abnormality. No acute or significant osseous findings. Review of the MIP images confirms the above findings. IMPRESSION: No definite evidence of pulmonary embolus is noted. Moderate right pleural effusion is noted with associated atelectasis of right lower lobe. Minimal left pleural effusion is noted with adjacent subsegmental atelectasis. Coronary artery calcifications are noted suggesting coronary artery disease. Aortic Atherosclerosis (ICD10-I70.0). Electronically Signed   By: Lupita Raider M.D.   On: 11/26/2022 14:49   DG Chest Port 1 View  Result Date: 11/26/2022 CLINICAL DATA:  Shortness of breath. EXAM: PORTABLE CHEST 1 VIEW COMPARISON:  Mar 22, 2007. FINDINGS: Mild cardiomegaly is noted. Moderate right basilar opacity is noted concerning for pneumonia or atelectasis with associated pleural effusion. Minimal left basilar subsegmental atelectasis is noted. Postsurgical changes are noted in right humerus. IMPRESSION: Moderate right basilar opacity concerning for pneumonia or atelectasis with associated pleural effusion. Electronically Signed   By: Lupita Raider M.D.   On: 11/26/2022 13:34    Microbiology: Recent Results (from the past 240 hour(s))  Resp panel by RT-PCR (RSV, Flu A&B, Covid) Anterior Nasal Swab     Status: None   Collection Time: 11/26/22  1:01 PM   Specimen: Anterior Nasal Swab  Result Value Ref Range Status   SARS Coronavirus 2 by RT PCR NEGATIVE NEGATIVE Final    Comment: (NOTE) SARS-CoV-2 target nucleic acids are NOT DETECTED.  The SARS-CoV-2 RNA is generally detectable in upper respiratory specimens during the acute phase of infection. The lowest concentration of SARS-CoV-2 viral copies this assay can detect is 138 copies/mL. A negative result does not preclude SARS-Cov-2  infection and should not be used as the sole basis for treatment or other patient management decisions. A negative result may occur with  improper specimen  collection/handling, submission of specimen other than nasopharyngeal swab, presence of viral mutation(s) within the areas targeted by this assay, and inadequate number of viral copies(<138 copies/mL). A negative result must be combined with clinical observations, patient history, and epidemiological information. The expected result is Negative.  Fact Sheet for Patients:  BloggerCourse.com  Fact Sheet for Healthcare Providers:  SeriousBroker.it  This test is no t yet approved or cleared by the [place] FDA and  has been authorized for detection and/or diagnosis of SARS-CoV-2 by FDA under an Emergency Use Authorization (EUA). This EUA will remain  in effect (meaning this test can be used) for the duration of the COVID-19 declaration under Section 564(b)(1) of the Act, 21 U.S.C.section 360bbb-3(b)(1), unless the authorization is terminated  or revoked sooner.       Influenza A by PCR NEGATIVE NEGATIVE Final   Influenza B by PCR NEGATIVE NEGATIVE Final    Comment: (NOTE) The Xpert Xpress SARS-CoV-2/FLU/RSV plus assay is intended as an aid in the diagnosis of influenza from Nasopharyngeal swab specimens and should not be used as a sole basis for treatment. Nasal washings and aspirates are unacceptable for Xpert Xpress SARS-CoV-2/FLU/RSV testing.  Fact Sheet for Patients: BloggerCourse.com  Fact Sheet for Healthcare Providers: SeriousBroker.it  This test is not yet approved or cleared by the [place] FDA and has been authorized for detection and/or diagnosis of SARS-CoV-2 by FDA under an Emergency Use Authorization (EUA). This EUA will remain in effect (meaning this test can be used) for the duration of the COVID-19 declaration under Section 564(b)(1) of the Act, 21 U.S.C. section 360bbb-3(b)(1), unless the authorization is terminated or revoked.     Resp Syncytial  Virus by PCR NEGATIVE NEGATIVE Final    Comment: (NOTE) Fact Sheet for Patients: BloggerCourse.com  Fact Sheet for Healthcare Providers: SeriousBroker.it  This test is not yet approved or cleared by the [place] FDA and has been authorized for detection and/or diagnosis of SARS-CoV-2 by FDA under an Emergency Use Authorization (EUA). This EUA will remain in effect (meaning this test can be used) for the duration of the COVID-19 declaration under Section 564(b)(1) of the Act, 21 U.S.C. section 360bbb-3(b)(1), unless the authorization is terminated or revoked.  Performed at [facility], [address] [place], [place] [address]   Culture, blood (routine x 2)     Status: None   Collection Time: 11/26/22  1:05 PM   Specimen: BLOOD  Result Value Ref Range Status   Specimen Description BLOOD BLOOD RIGHT HAND  Final   Special Requests   Final    BOTTLES DRAWN AEROBIC AND ANAEROBIC Blood Culture results may not be optimal due to an inadequate volume of blood received in culture bottles   Culture   Final    NO GROWTH 5 DAYS Performed at [facility], [address] [place], [place] [address]    Report Status 12/01/2022 FINAL  Final  Culture, blood (routine x 2)     Status: None   Collection Time: 11/26/22  2:00 PM   Specimen: BLOOD  Result Value Ref Range Status   Specimen Description BLOOD LEFT ANTECUBITAL  Final   Special Requests   Final    BOTTLES DRAWN AEROBIC AND ANAEROBIC Blood Culture results may not be optimal due to an excessive volume of blood received in  culture bottles   Culture   Final    NO GROWTH 5 DAYS Performed at [facility], [address] [place], [place] [address]    Report Status 12/01/2022 FINAL  Final  Culture, body fluid w Gram Stain-bottle     Status: None   Collection Time: 11/27/22 10:17 AM   Specimen: Pleura  Result Value Ref Range Status   Specimen Description PLEURAL   Final   Special Requests NONE  Final   Culture   Final    NO GROWTH 5 DAYS Performed at [facility], [address], [place], [place] [address]    Report Status 12/02/2022 FINAL  Final  Gram stain     Status: None   Collection Time: 11/27/22 10:17 AM   Specimen: Pleura  Result Value Ref Range Status   Specimen Description PLEURAL  Final   Special Requests NONE  Final   Gram Stain   Final    NO WBC SEEN NO ORGANISMS SEEN Performed at [facility], [address], [place], [place] [address]    Report Status 11/27/2022 FINAL  Final  Respiratory (~20 pathogens) panel by PCR     Status: None   Collection Time: 12/02/22 11:08 AM   Specimen: Nasopharyngeal Swab; Respiratory  Result Value Ref Range Status   Adenovirus NOT DETECTED NOT DETECTED Final   Coronavirus 229E NOT DETECTED NOT DETECTED Final    Comment: (NOTE) The Coronavirus on the Respiratory Panel, DOES NOT test for the novel  Coronavirus (2019 nCoV)    Coronavirus HKU1 NOT DETECTED NOT DETECTED Final   Coronavirus NL63 NOT DETECTED NOT DETECTED Final   Coronavirus OC43 NOT DETECTED NOT DETECTED Final   Metapneumovirus NOT DETECTED NOT DETECTED Final   Rhinovirus / Enterovirus NOT DETECTED NOT DETECTED Final   Influenza A NOT DETECTED NOT DETECTED Final   Influenza B NOT DETECTED NOT DETECTED Final   Parainfluenza Virus 1 NOT DETECTED NOT DETECTED Final   Parainfluenza Virus 2 NOT DETECTED NOT DETECTED Final   Parainfluenza Virus 3 NOT DETECTED NOT DETECTED Final   Parainfluenza Virus 4 NOT DETECTED NOT DETECTED Final   Respiratory Syncytial Virus NOT DETECTED NOT DETECTED Final   Bordetella pertussis NOT DETECTED NOT DETECTED Final   Bordetella Parapertussis NOT DETECTED NOT DETECTED Final   Chlamydophila pneumoniae NOT DETECTED NOT DETECTED Final   Mycoplasma pneumoniae NOT DETECTED NOT DETECTED Final    Comment: Performed at [facility], [address], [place], [place] [address]      Labs: Basic Metabolic Panel: Recent Labs  Lab 11/28/22 0049 11/29/22 0115 11/30/22 0059 12/02/22 0130 12/03/22 0117  NA 139 141 138 137 134*  K 4.0 4.0 3.9 3.5 4.2  CL 104 105 98 96* 103  CO2 25 30 30 30 22   GLUCOSE 174* 111* 227* 219* 230*  BUN 23 13 10 10  27*  CREATININE 0.97 0.70 0.75 0.76 0.66  CALCIUM 8.6* 9.1 9.1 8.8* 8.3*  MG  --  1.9  --   --   --    Liver Function Tests: No results for input(s): "AST", "ALT", "ALKPHOS", "BILITOT", "PROT", "ALBUMIN" in the last 168 hours. No results for input(s): "LIPASE", "AMYLASE" in the last 168 hours. No results for input(s): "AMMONIA" in the last 168 hours. CBC: Recent Labs  Lab 12/02/22 0130  WBC 5.0  HGB 13.8  HCT 45.2  MCV 86.9  PLT 144*   Cardiac Enzymes: No results for input(s): "CKTOTAL", "CKMB", "CKMBINDEX", "  TROPONINI" in the last 168 hours. BNP: BNP (last 3 results) Recent Labs    11/26/22 1327  BNP 548.1*    ProBNP (last 3 results) No results for input(s): "PROBNP" in the last 8760 hours.  CBG: Recent Labs  Lab 12/03/22 1210 12/03/22 1738 12/03/22 2021 12/04/22 0524 12/04/22 1115  GLUCAP 180* 156* 246* 190* 227*       Signed:  Zannie Cove MD.  [facility] 12/04/2022, 11:28 AM

## 2022-12-04 NOTE — TOC Transition Note (Signed)
Transition of Care (TOC) - CM/SW Discharge Note Marvetta Gibbons RN, BSN Transitions of Care Unit 4E- RN Case Manager See Treatment Team for direct phone #   Patient Details  Name: [name] MRN: [id number] Date of Birth: [birth year]  Transition [facility] Care [facility]) CM/SW Contact:  Dawayne Patricia, RN Phone Number: 12/04/2022, 12:42 PM   Clinical Narrative:    CM received notice this am that pt has bed available today for [facility]- per MD pt stable for transition to [facility] today.  Insurance Josem Kaufmann has been received.   No further TOC needs noted- Pt to transition to [facility] later today.    Final next level of care: IP Rehab Facility Barriers to Discharge: No Barriers Identified   Patient Goals and CMS Choice CMS Medicare.gov Compare Post Acute Care list provided to:: Patient Choice offered to / list presented to : Patient, Adult Children  Discharge Placement                   [facility]      Discharge Plan and Services Additional resources added to the After Visit Summary for     Discharge Planning Services: CM Consult Post Acute Care Choice: IP Rehab          DME Arranged: N/A DME Agency: NA       HH Arranged: NA HH Agency: NA        Social Determinants of Health (SDOH) Interventions SDOH Screenings   Food Insecurity: No Food Insecurity (12/01/2022)  Housing: Low Risk  (12/01/2022)  Transportation Needs: No Transportation Needs (12/01/2022)  Utilities: Not At Risk (12/01/2022)  Tobacco Use: Low Risk  (11/27/2022)     Readmission Risk Interventions    12/04/2022   12:42 PM  Readmission Risk Prevention Plan  Transportation Screening Complete  PCP or Specialist Appt within 5-7 Days Complete  Home Care Screening Complete  Medication Review (RN CM) Complete
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
## 2022-12-04 NOTE — Care Management Important Message (Signed)
Important Message  Patient Details  Name: [name] MRN: [id number] Date of Birth: [birth year]   Medicare Important Message Given:  Yes     Shelda Altes 12/04/2022, 11:38 AM

## 2022-12-04 NOTE — Progress Notes (Signed)
Inpatient Rehab Admissions Coordinator:    I have a CIR bed for this Pt. And can admit today. Rn may call report to [place], [place], [place] Admissions Coordinator  [phone] ([place]) [phone] (office)

## 2022-12-04 NOTE — Progress Notes (Incomplete)
Inpatient Rehabilitation Admission Medication Review by a Pharmacist  A complete drug regimen review was completed for this patient to identify any potential clinically significant medication issues.  High Risk Drug Classes Is patient taking? Indication by Medication  Antipsychotic {Receiving?:26196}   Anticoagulant {Receiving?:26196}   Antibiotic {Receiving?:26196}   Opioid {Receiving?:26196}   Antiplatelet {Receiving?:26196}   Hypoglycemics/insulin {Yes or No?:26198}   Vasoactive Medication {Receiving?:26196}   Chemotherapy {Receiving Chemo?:26197}   Other {Yes or No?:26198} Alendronate - bone health Calcium, MVI, Vit C, fish oil - supplement Aspirin - *** Diltiazem, metoprolol - afib Furosemide, spironolactone - CHF Gabapentin - neuropathy Insulin glargine, insulin aspart - DM Latanoprost - glaucoma Losartan - HTN Myrbetriq - urinary urgency  Ondansetron - PRN nausea Pravastatin - HLD Tramadol - PRN pain      Type of Medication Issue Identified Description of Issue Recommendation(s)  Drug Interaction(s) (clinically significant)     Duplicate Therapy     Allergy     No Medication Administration End Date     Incorrect Dose     Additional Drug Therapy Needed     Significant med changes from prior encounter (inform family/care partners about these prior to discharge). Celecoxib, metoprolol, percocet stopped on discharge Communicate medication changes with patient/family at discharge  Other       Clinically significant medication issues were identified that warrant physician communication and completion of prescribed/recommended actions by midnight of the next day:  {Yes or No?:26198}  Name of provider notified for urgent issues identified: ***   Provider Method of Notification: ***    Pharmacist comments: ***   Time spent performing this drug regimen review (minutes): 20   Thank you for allowing pharmacy to be a part of this patient's care.  ***
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
## 2022-12-04 NOTE — Progress Notes (Signed)
Patient arrieved via bed from 4E. Daughter accompanying patient. Patient is A&O x 4 and able to make her needs known. Lung sounds clear bilateral, ABD soft non distended. Bilateral lower extremity edema  +2 right, + 1 left.. Right great toe has an abrasion 2 x 2 covered with a foam lift dressing. Patient has limited movement in her right shoulder, and right knee. Declines pain medications at this time. Call light within reach.

## 2022-12-05 DIAGNOSIS — N3281 Overactive bladder: Secondary | ICD-10-CM

## 2022-12-05 DIAGNOSIS — R5381 Other malaise: Secondary | ICD-10-CM | POA: Diagnosis not present

## 2022-12-05 DIAGNOSIS — E1069 Type 1 diabetes mellitus with other specified complication: Secondary | ICD-10-CM

## 2022-12-05 LAB — GLUCOSE, CAPILLARY
Glucose-Capillary: 179 mg/dL — ABNORMAL HIGH (ref 70–99)
Glucose-Capillary: 209 mg/dL — ABNORMAL HIGH (ref 70–99)
Glucose-Capillary: 221 mg/dL — ABNORMAL HIGH (ref 70–99)
Glucose-Capillary: 254 mg/dL — ABNORMAL HIGH (ref 70–99)

## 2022-12-05 LAB — URINALYSIS, ROUTINE W REFLEX MICROSCOPIC
Bilirubin Urine: NEGATIVE
Glucose, UA: NEGATIVE mg/dL
Ketones, ur: NEGATIVE mg/dL
Nitrite: NEGATIVE
Protein, ur: NEGATIVE mg/dL
Specific Gravity, Urine: 1.008 (ref 1.005–1.030)
WBC, UA: >50 WBC/hpf — ABNORMAL HIGH (ref 0–5)
pH: 7 (ref 5.0–8.0)

## 2022-12-05 MED ORDER — MELATONIN 5 MG PO TABS
5.0000 mg | ORAL_TABLET | Freq: Every day | ORAL | Status: DC
  Administered 2022-12-05 – 2022-12-11 (×7): 5 mg via ORAL
  Filled 2022-12-05 (×7): qty 1

## 2022-12-05 MED ORDER — INSULIN GLARGINE-YFGN 100 UNIT/ML ~~LOC~~ SOLN
16.0000 [IU] | Freq: Every day | SUBCUTANEOUS | Status: DC
  Administered 2022-12-05 – 2022-12-09 (×5): 16 [IU] via SUBCUTANEOUS
  Filled 2022-12-05 (×6): qty 0.16

## 2022-12-05 NOTE — Progress Notes (Signed)
Patient continent/incontinent of urine, frequently wet, PVR volumes in 250 when measured. Discussed toileting plan with therapy, will use purewick at this time until patient able to toilet. Current bed level with nursing.  Per order UA/CNS sent. States she has overactive bladder at baseline.
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
## 2022-12-05 NOTE — Progress Notes (Signed)
Inpatient Rehabilitation Admission Medication Review by a Pharmacist  A complete drug regimen review was completed for this patient to identify any potential clinically significant medication issues.  High Risk Drug Classes Is patient taking? Indication by Medication  Antipsychotic No   Anticoagulant Yes Enoxaparin - VTE ppx   Antibiotic No   Opioid No   Antiplatelet Yes Aspirin   Hypoglycemics/insulin Yes Insulin glargine, insulin aspart - DM  Vasoactive Medication Yes Diltiazem, metoprolol - afib Losartan - HTN Furosemide, spironolactone - CHF  Chemotherapy No   Other Yes Alendronate - bone health Calcium, MVI, Vit C, fish oil - supplement Nortriptyline - mood  Gabapentin - neuropathy Latanoprost - glaucoma Myrbetriq - urinary urgency  Ondansetron - PRN nausea Pravastatin - HLD Tramadol - PRN pain  Voltaren gel - pain  Melatonin - sleep      Type of Medication Issue Identified Description of Issue Recommendation(s)  Drug Interaction(s) (clinically significant)     Duplicate Therapy     Allergy     No Medication Administration End Date     Incorrect Dose     Additional Drug Therapy Needed     Significant med changes from prior encounter (inform family/care partners about these prior to discharge). Celecoxib, percocet stopped on discharge Communicate medication changes with patient/family at discharge  Other       Clinically significant medication issues were identified that warrant physician communication and completion of prescribed/recommended actions by midnight of the next day:  No   Time spent performing this drug regimen review (minutes): 20   Thank you for allowing pharmacy to be a part of this patient's care.  Francena Hanly, PharmD Pharmacy Resident  12/05/2022 12:22 PM

## 2022-12-05 NOTE — Progress Notes (Signed)
Occupational Therapy Session Note  Patient Details  Name: [name] MRN: [id number] Date of Birth: [birth year]  Today's Date: 12/05/2022 OT Individual Time: 7628-3151 OT Individual Time Calculation (min): 59 min    Short Term Goals: Week 1:  OT Short Term Goal 1 (Week 1): Pt will complete STS transfer with Mod A + LRAD in preparation for standing ADL tasks. OT Short Term Goal 2 (Week 1): Pt will complete simple bathing activities with consistent Mod A + LRAD. OT Short Term Goal 3 (Week 1): Pt will complete LB dressing with Mod A + LRAD.  Skilled Therapeutic Interventions/Progress Updates:  Pt received resting in bed for skilled OT session with focus on toileting, OOB activity, and functiona transfers. Pt agreeable to interventions, demonstrating overall pleasant mood. Pt with varying levels of pain in R-shoulder/arm. OT offering intermediate rest breaks and positioning suggestions throughout session to address pain/fatigue and maximize participation/safety in session.   Pt performs R/L rolling for bed-level toileting/bed-pan placement and brief change. Pt expressing concerns over increased urgency and episodes of incontinence, concerns communicate with nursing, and bladder scan completed during session. Pt completes supine>sit EOB with Mod A and HOB elevated.  Pt benefiting from clear/concise instruction when placing stedy for transfer and for most effective hand placement. Pt requires several attempts, Max A + heavy multimodal cuing to reach stance on stedy, and eventual seat on perch. Pt continuing to demonstrate increased fear of falling, experiencing some relief with verbal reassurance of safe positioning, and when placed in-front of mirror and asked to practice PLB. Pt with R-lean sitting on perch, self-correcting when prompted to look in mirror. Pt requires heavy Max A to come back to standing and lower onto recliner, expressing increased pain in R-arm (elevated at end of session).   Pt  remained resting in recliner with all immediate needs met at end of session. Pt continues to be appropriate for skilled OT intervention to promote further functional independence. '  Therapy Documentation Precautions:  Precautions Precautions: Shoulder Type of Shoulder Precautions: R-shoulder pain with ROM/WB Precaution Comments: History of frequent falls at home. Restrictions Weight Bearing Restrictions: No   Therapy/Group: Individual Therapy  Maudie Mercury, OTR/L, MSOT  12/05/2022, 7:10 PM

## 2022-12-05 NOTE — Plan of Care (Cosign Needed)
  Problem: RH Balance Goal: LTG Patient will maintain dynamic sitting balance (PT) Description: LTG:  Patient will maintain dynamic sitting balance with assistance during mobility activities (PT) Flowsheets (Taken 12/05/2022 1525) LTG: Pt will maintain dynamic sitting balance during mobility activities with:: Minimal Assistance - Patient > 75% Goal: LTG Patient will maintain dynamic standing balance (PT) Description: LTG:  Patient will maintain dynamic standing balance with assistance during mobility activities (PT) Flowsheets (Taken 12/05/2022 1525) LTG: Pt will maintain dynamic standing balance during mobility activities with:: Minimal Assistance - Patient > 75%   Problem: Sit to Stand Goal: LTG:  Patient will perform sit to stand with assistance level (PT) Description: LTG:  Patient will perform sit to stand with assistance level (PT) Flowsheets (Taken 12/05/2022 1525) LTG: PT will perform sit to stand in preparation for functional mobility with assistance level: Minimal Assistance - Patient > 75%   Problem: RH Bed Mobility Goal: LTG Patient will perform bed mobility with assist (PT) Description: LTG: Patient will perform bed mobility with assistance, with/without cues (PT). Flowsheets (Taken 12/05/2022 1525) LTG: Pt will perform bed mobility with assistance level of: Contact Guard/Touching assist   Problem: RH Bed to Chair Transfers Goal: LTG Patient will perform bed/chair transfers w/assist (PT) Description: LTG: Patient will perform bed to chair transfers with assistance (PT). Flowsheets (Taken 12/05/2022 1525) LTG: Pt will perform Bed to Chair Transfers with assistance level: Minimal Assistance - Patient > 75%   Problem: RH Car Transfers Goal: LTG Patient will perform car transfers with assist (PT) Description: LTG: Patient will perform car transfers with assistance (PT). Flowsheets (Taken 12/05/2022 1525) LTG: Pt will perform car transfers with assist:: Moderate Assistance - Patient  50 - 74%   Problem: RH Ambulation Goal: LTG Patient will ambulate in controlled environment (PT) Description: LTG: Patient will ambulate in a controlled environment, # of feet with assistance (PT). Flowsheets (Taken 12/05/2022 1525) LTG: Pt will ambulate in controlled environ  assist needed:: Minimal Assistance - Patient > 75% LTG: Ambulation distance in controlled environment: 10-15 ft Goal: LTG Patient will ambulate in home environment (PT) Description: LTG: Patient will ambulate in home environment, # of feet with assistance (PT). Flowsheets (Taken 12/05/2022 1525) LTG: Pt will ambulate in home environ  assist needed:: Minimal Assistance - Patient > 75% LTG: Ambulation distance in home environment: 10-83ft   Problem: RH Stairs Goal: LTG Patient will ambulate up and down stairs w/assist (PT) Description: LTG: Patient will ambulate up and down # of stairs with assistance (PT) Flowsheets (Taken 12/05/2022 1525) LTG: Pt will ambulate up/down stairs assist needed:: Moderate Assistance - Patient 50 - 74% LTG: Pt will  ambulate up and down number of stairs: 3 stairs

## 2022-12-05 NOTE — Progress Notes (Signed)
PROGRESS NOTE   Subjective/Complaints: Pt reporting that she slept poorly, having trouble staying asleep because she has a lot on her mind. Pain ok. LBM today was a little loose but not diarrhea. Having incontinence/bed wetting-- has had issue with overactive bladder before, on Myrbetriq, states she can feel when she needs to urinate but isn't able to get to the commode in time. Had purewick during hospitalization and this was ended yesterday. Did notice some increased urinary frequency today, wants to have U/A done.   ROS: +urinary frequency, +insomnia. Denies fevers, chills, CP, SOB, abd pain, N/V/D/C, dysuria, new/worsening numbness, tingling, focal weakness, or any other complaints at this time.   Objective:   No results found. No results for input(s): "WBC", "HGB", "HCT", "PLT" in the last 72 hours. Recent Labs    12/03/22 0117  NA 134*  K 4.2  CL 103  CO2 22  GLUCOSE 230*  BUN 27*  CREATININE 0.66  CALCIUM 8.3*    Intake/Output Summary (Last 24 hours) at 12/05/2022 0827 Last data filed at 12/04/2022 2250 Gross per 24 hour  Intake 620 ml  Output 1800 ml  Net -1180 ml        Physical Exam: Vital Signs Blood pressure (!) 157/67, pulse (!) 104, temperature 98.1 F (36.7 C), temperature source Oral, resp. rate 18, height 5\' 7"  (1.702 m), weight 94.3 kg, SpO2 93 %.  Physical Exam Constitution: Appropriate appearance for age. No apparent distress +Obese HEENT: R pupil fixed, dilated, blind; L pupil reactive, EOM grossly intact.  Resp: CTAB. No rales, rhonchi, or wheezing. Cardio: Irregular rhythm, regular rate. No mumurs, rubs, or gallops. No peripheral edema. Abdomen: Nondistended. Nontender. +bowel sounds. Psych: Appropriate mood and affect. Skin: BLE with ichthyosis. R foot mepilex dressing with open wound, clean base, mild bloody drainage. Finger bilateral hand flushed appearing. No edema    Neurologic/MsK:    DTRs: Reflexes were 2+ in bilateral UEs; absent bilateral patella; 2+ BL achilles. Babinsky: flexor responses b/l.   Hoffmans: negative b/l Sensory exam: revealed reduced sensation to light touch over right plantar foot and heel. . Motor exam:       R Genu Valgum. ROM limited to 90 degrees R shoulder, -5 degrees R dorsiflexion      Strength:                RUE: 4/5 SA, 5-/5 EF, 5-/5 EE, 5/5 WE, 5/5 FF, 5/5 FA                 LUE: 5/5 SA, 5/5 EF, 5/5 EE, 5/5 WE, 5/5 FF, 5/5 FA                 RLE: 3+/5 HF, 4+/5 KE, 4/5 DF,  5/5 PF                 LLE:  3-/5 HF, 3+/5 KE, 5/5 DF,  5/5 PF  Coordination: Fine motor coordination was normal.      Assessment/Plan: 1. Functional deficits which require 3+ hours per day of interdisciplinary therapy in a comprehensive inpatient rehab setting. Physiatrist is providing close team supervision and 24 hour management of active medical problems listed below.  Physiatrist and rehab team continue to assess barriers to discharge/monitor patient progress toward functional and medical goals  Care Tool:  Bathing              Bathing assist       Upper Body Dressing/Undressing Upper body dressing        Upper body assist      Lower Body Dressing/Undressing Lower body dressing            Lower body assist       Toileting Toileting    Toileting assist       Transfers Chair/bed transfer  Transfers assist           Locomotion Ambulation   Ambulation assist              Walk 10 feet activity   Assist           Walk 50 feet activity   Assist           Walk 150 feet activity   Assist           Walk 10 feet on uneven surface  activity   Assist           Wheelchair     Assist               Wheelchair 50 feet with 2 turns activity    Assist            Wheelchair 150 feet activity     Assist          Blood pressure (!) 157/67, pulse (!) 104, temperature  98.1 F (36.7 C), temperature source Oral, resp. rate 18, height 5\' 7"  (1.702 m), weight 94.3 kg, SpO2 93 %.  Medical Problem List and Plan: 1. Functional deficits secondary to debility due to acute on chronic HFpEF, atrial fibrillation with RVR             -patient may shower             -ELOS/Goals: 10-14 days, SPV PT, OT goals  2.  Antithrombotics: -DVT/anticoagulation:  Lovenox 40 mg daily (No full-dose AC d/t Hx SAH 2007)             -antiplatelet therapy: ASA 325 daily per IM until OP cards f/u 3. Pain Management:  Oxycodone or tramadol prn.  -encourage use of Voltaren gel 2g QID for OA right shoulder/right knee.   4. Mood/Behavior/Sleep: LCSW to follow for evaluation and support.              -Melatonin prn--12/05/22 ordered as scheduled 5mg  QHS, Trazodone PRN             -antipsychotic agents: N/A  -Nortriptyline 10mg  QHS, Neurontin 100mg  QHS 5. Neuropsych/cognition: This patient is capable of making decisions on her own behalf. 6. Skin/Wound Care: Add lotion to BLE and hands             -routine pressure relief measures.  7. Fluids/Electrolytes/Nutrition: Monitor I/O. Intake improving. Monitor CMP 12/07/22 and weekly BMPs starting 12/14/22 -May need to stay dry to avoid fluid overload. 8. Acute on chronic diastolic CHF: Strict I/O w/daily weights. -Cardiac diet. Monitor for signs of overload.  -continue Aldactone 12.5mg  QD, metoprolol 100mg  QD, pravastatin 40mg  QD and Losartan 25mg  QD -monitor for recurrent hyperkalemia (Losartan dose decreased 01/14) -12/05/22 weight downtrending, monitor, not volume overloaded today Filed Weights   12/04/22 1559 12/04/22 1646 12/05/22 0505  Weight: 96.8 kg 96.8 kg 94.3 kg  9. A Fib w/RVR: Monitor HR TID--continue Cardizem 180mg  QD and Metoprolol 100mg  QD 10. HTN: BPs with some lability, 130s-160s SBP -12/05/22 monitor for now, Losartan decreased 11/29/22, could consider going back to home dosing if appropriate Vitals:   12/04/22 1559  12/04/22 1646 12/04/22 1940 12/05/22 0343  BP: 137/67 137/67 139/72 (!) 157/67   12/05/22 0843 12/05/22 0845  BP: (!) 161/65 (!) 161/65    11.  T1DM: Hgb A1c- 7.4. Monitor BS ac/hs and use SSI for elevated BS             -continue insulin glargline 15 units daily.  -May need to add meal coverage-->used 5- 7units tid prn PTA.  -12/05/22 CBGs 170s-200s, will increase Semglee to 16U QD, monitor CBG (last 3)  Recent Labs    12/04/22 2041 12/05/22 0551 12/05/22 1142  GLUCAP 198* 179* 209*     12. Chronic bilateral shoulder and left knee pain: Oxycodone prn -Refusing voltaren gel for local measures-->encourage use             -Aquathermia for local measures.              -L knee: Follows OP ortho, no benefit from injections in the past 13. Upper respiratory infection, Nocturnal hypoxia: Question sleep apnea.  -Encourage pulmonary hygiene -Respiratory viral panel negative x2 inpatient -Remains with cough, intermittent SOB - add PRN inhaler, monitor 14.  Pre-renal azotemia: Likely due to diuresis. BUN up but SCr improving.   -Continue to monitor.  -12/05/22 labs from 12/03/22 with BUN 27, SCr 0.66; monitor on labs on Mon 12/07/22 15. Overactive bladder, incontinence: hx of, on Myrbetriq 50mg  QD; now off purewick -12/05/22 having increased frequency and incontinence, ordered U/A, UCx, and scheduled voiding program    LOS: 1 days A FACE TO [place] 12/05/2022, 8:27 AM

## 2022-12-05 NOTE — Plan of Care (Signed)
  Problem: RH Balance Goal: LTG: Patient will maintain dynamic sitting balance (OT) Description: LTG:  Patient will maintain dynamic sitting balance with assistance during activities of daily living (OT) Flowsheets (Taken 12/05/2022 1850) LTG: Pt will maintain dynamic sitting balance during ADLs with: Supervision/Verbal cueing Goal: LTG Patient will maintain dynamic standing with ADLs (OT) Description: LTG:  Patient will maintain dynamic standing balance with assist during activities of daily living (OT)  Flowsheets (Taken 12/05/2022 1850) LTG: Pt will maintain dynamic standing balance during ADLs with: Minimal Assistance - Patient > 75%   Problem: Sit to Stand Goal: LTG:  Patient will perform sit to stand in prep for activites of daily living with assistance level (OT) Description: LTG:  Patient will perform sit to stand in prep for activites of daily living with assistance level (OT) Flowsheets (Taken 12/05/2022 1850) LTG: PT will perform sit to stand in prep for activites of daily living with assistance level: Minimal Assistance - Patient > 75%   Problem: RH Bathing Goal: LTG Patient will bathe all body parts with assist levels (OT) Description: LTG: Patient will bathe all body parts with assist levels (OT) Flowsheets (Taken 12/05/2022 1850) LTG: Pt will perform bathing with assistance level/cueing: Minimal Assistance - Patient > 75%   Problem: RH Dressing Goal: LTG Patient will perform lower body dressing w/assist (OT) Description: LTG: Patient will perform lower body dressing with assist, with/without cues in positioning using equipment (OT) Flowsheets (Taken 12/05/2022 1850) LTG: Pt will perform lower body dressing with assistance level of: Minimal Assistance - Patient > 75%   Problem: RH Toileting Goal: LTG Patient will perform toileting task (3/3 steps) with assistance level (OT) Description: LTG: Patient will perform toileting task (3/3 steps) with assistance level (OT)  Flowsheets  (Taken 12/05/2022 1850) LTG: Pt will perform toileting task (3/3 steps) with assistance level: Minimal Assistance - Patient > 75%   Problem: RH Toilet Transfers Goal: LTG Patient will perform toilet transfers w/assist (OT) Description: LTG: Patient will perform toilet transfers with assist, with/without cues using equipment (OT) Flowsheets (Taken 12/05/2022 1850) LTG: Pt will perform toilet transfers with assistance level of: Minimal Assistance - Patient > 75%   Problem: RH Tub/Shower Transfers Goal: LTG Patient will perform tub/shower transfers w/assist (OT) Description: LTG: Patient will perform tub/shower transfers with assist, with/without cues using equipment (OT) Flowsheets (Taken 12/05/2022 1850) LTG: Pt will perform tub/shower stall transfers with assistance level of: Minimal Assistance - Patient > 75%
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
## 2022-12-06 DIAGNOSIS — N3000 Acute cystitis without hematuria: Secondary | ICD-10-CM

## 2022-12-06 DIAGNOSIS — R5381 Other malaise: Secondary | ICD-10-CM | POA: Diagnosis not present

## 2022-12-06 DIAGNOSIS — E1069 Type 1 diabetes mellitus with other specified complication: Secondary | ICD-10-CM | POA: Diagnosis not present

## 2022-12-06 DIAGNOSIS — N3281 Overactive bladder: Secondary | ICD-10-CM | POA: Diagnosis not present

## 2022-12-06 LAB — GLUCOSE, CAPILLARY
Glucose-Capillary: 108 mg/dL — ABNORMAL HIGH (ref 70–99)
Glucose-Capillary: 163 mg/dL — ABNORMAL HIGH (ref 70–99)
Glucose-Capillary: 169 mg/dL — ABNORMAL HIGH (ref 70–99)
Glucose-Capillary: 271 mg/dL — ABNORMAL HIGH (ref 70–99)

## 2022-12-06 MED ORDER — NITROFURANTOIN MONOHYD MACRO 100 MG PO CAPS
100.0000 mg | ORAL_CAPSULE | Freq: Two times a day (BID) | ORAL | Status: AC
  Administered 2022-12-06 – 2022-12-10 (×10): 100 mg via ORAL
  Filled 2022-12-06 (×11): qty 1

## 2022-12-06 NOTE — Progress Notes (Signed)
PROGRESS NOTE   Subjective/Complaints: Slept ok last night, had a BM yesterday, pain doing ok. Still having some increased urinary frequency but denies other issues or complaints today.   ROS: +urinary frequency, +insomnia-improving. Denies fevers, chills, CP, SOB, abd pain, N/V/D/C, dysuria, new/worsening numbness, tingling, focal weakness, or any other complaints at this time.   Objective:   No results found. No results for input(s): "WBC", "HGB", "HCT", "PLT" in the last 72 hours. No results for input(s): "NA", "K", "CL", "CO2", "GLUCOSE", "BUN", "CREATININE", "CALCIUM" in the last 72 hours.  Urinalysis    Component Value Date/Time   COLORURINE YELLOW 12/05/2022 1630   APPEARANCEUR CLOUDY (A) 12/05/2022 1630   LABSPEC 1.008 12/05/2022 1630   PHURINE 7.0 12/05/2022 1630   GLUCOSEU NEGATIVE 12/05/2022 1630   HGBUR MODERATE (A) 12/05/2022 1630   BILIRUBINUR NEGATIVE 12/05/2022 1630   KETONESUR NEGATIVE 12/05/2022 1630   PROTEINUR NEGATIVE 12/05/2022 1630   NITRITE NEGATIVE 12/05/2022 1630   LEUKOCYTESUR LARGE (A) 12/05/2022 1630  Urine Micro 12/05/22: Bacteria: few RBC: 21-50 Squamous: 0-5 WBC clumps: present WBC: >50  UCx 12/05/22: pending     Intake/Output Summary (Last 24 hours) at 12/06/2022 0705 Last data filed at 12/06/2022 0120 Gross per 24 hour  Intake 594 ml  Output 900 ml  Net -306 ml         Physical Exam: Vital Signs Blood pressure 134/70, pulse 84, temperature 98.3 F (36.8 C), temperature source Oral, resp. rate 18, height 5\' 7"  (1.702 m), weight 94.1 kg, SpO2 90 %.  Physical Exam Constitution: Appropriate appearance for age. No apparent distress +Obese HEENT: R pupil fixed, dilated, blind; L pupil reactive, EOM grossly intact.  Resp: CTAB. No rales, rhonchi, or wheezing. Cardio: Irregular rhythm, regular rate. No mumurs, rubs, or gallops. No peripheral edema. Abdomen: Nondistended.  Nontender. +bowel sounds. Psych: Appropriate mood and affect. Skin: BLE with ichthyosis. R foot mepilex dressing with open wound, clean base, mild bloody drainage. Finger bilateral hand flushed appearing. Trace b/l  edema    Neurologic/MsK:   DTRs: Reflexes were 2+ in bilateral UEs; absent bilateral patella; 2+ BL achilles. Babinsky: flexor responses b/l.   Hoffmans: negative b/l Sensory exam: revealed reduced sensation to light touch over right plantar foot and heel. . Motor exam:       R Genu Valgum. ROM limited to 90 degrees R shoulder, -5 degrees R dorsiflexion      Strength:                RUE: 4/5 SA, 5-/5 EF, 5-/5 EE, 5/5 WE, 5/5 FF, 5/5 FA                 LUE: 5/5 SA, 5/5 EF, 5/5 EE, 5/5 WE, 5/5 FF, 5/5 FA                 RLE: 3+/5 HF, 4+/5 KE, 4/5 DF,  5/5 PF                 LLE:  3-/5 HF, 3+/5 KE, 5/5 DF,  5/5 PF  Coordination: Fine motor coordination was normal.      Assessment/Plan: 1. Functional deficits which require 3+ hours  per day of interdisciplinary therapy in a comprehensive inpatient rehab setting. Physiatrist is providing close team supervision and 24 hour management of active medical problems listed below. Physiatrist and rehab team continue to assess barriers to discharge/monitor patient progress toward functional and medical goals  Care Tool:  Bathing    Body parts bathed by patient: Right arm, Chest, Abdomen, Front perineal area, Right upper leg, Left upper leg, Face   Body parts bathed by helper: Left arm, Buttocks, Right lower leg, Left lower leg     Bathing assist Assist Level: Moderate Assistance - Patient 50 - 74%     Upper Body Dressing/Undressing Upper body dressing   What is the patient wearing?: Pull over shirt    Upper body assist Assist Level: Minimal Assistance - Patient > 75%    Lower Body Dressing/Undressing Lower body dressing      What is the patient wearing?: Incontinence brief     Lower body assist Assist for lower body  dressing: Dependent - Patient 0%     Toileting Toileting    Toileting assist Assist for toileting: Total Assistance - Patient < 25%     Transfers Chair/bed transfer  Transfers assist     Chair/bed transfer assist level: 2 Helpers     Locomotion Ambulation   Ambulation assist   Ambulation activity did not occur: Safety/medical concerns          Walk 10 feet activity   Assist  Walk 10 feet activity did not occur: Safety/medical concerns        Walk 50 feet activity   Assist Walk 50 feet with 2 turns activity did not occur: Safety/medical concerns         Walk 150 feet activity   Assist Walk 150 feet activity did not occur: Safety/medical concerns         Walk 10 feet on uneven surface  activity   Assist Walk 10 feet on uneven surfaces activity did not occur: Safety/medical concerns         Wheelchair     Assist Is the patient using a wheelchair?: Yes Type of Wheelchair: Manual    Wheelchair assist level: Dependent - Patient 0% Max wheelchair distance: 150    Wheelchair 50 feet with 2 turns activity    Assist        Assist Level: Dependent - Patient 0%   Wheelchair 150 feet activity     Assist      Assist Level: Dependent - Patient 0%   Blood pressure 134/70, pulse 84, temperature 98.3 F (36.8 C), temperature source Oral, resp. rate 18, height 5\' 7"  (1.702 m), weight 94.1 kg, SpO2 90 %.  Medical Problem List and Plan: 1. Functional deficits secondary to debility due to acute on chronic HFpEF, atrial fibrillation with RVR             -patient may shower             -ELOS/Goals: 10-14 days, ST, PT, OT goals  2.  Antithrombotics: -DVT/anticoagulation:  Lovenox 40 mg daily (No full-dose AC d/t Hx SAH 2007)             -antiplatelet therapy: ASA 325 daily per IM until OP cards f/u 3. Pain Management:  Oxycodone or tramadol prn.  -encourage use of Voltaren gel 2g QID for OA right shoulder/right knee.   4.  Mood/Behavior/Sleep: LCSW to follow for evaluation and support.              -Melatonin  prn--12/05/22 ordered as scheduled 5mg  QHS, Trazodone PRN   -12/06/22 sleeping improving, continue current regimen             -antipsychotic agents: N/A  -Nortriptyline 10mg  QHS, Neurontin 100mg  QHS 5. Neuropsych/cognition: This patient is capable of making decisions on her own behalf. 6. Skin/Wound Care: Add lotion to BLE and hands             -routine pressure relief measures.  7. Fluids/Electrolytes/Nutrition: Monitor I/O. Intake improving. Monitor CMP 12/07/22 and weekly BMPs starting 12/14/22 -May need to stay dry to avoid fluid overload. 8. Acute on chronic diastolic CHF: Strict I/O w/daily weights. -Cardiac diet. Monitor for signs of overload.  -continue Lasix 40mg  QD, Aldactone 12.5mg  QD, metoprolol 100mg  QD, pravastatin 40mg  QD and Losartan 25mg  QD -monitor for recurrent hyperkalemia (Losartan dose decreased 01/14) -12/06/22 weight stable, monitor, not too volume overloaded today Filed Weights   12/04/22 1646 12/05/22 0505 12/06/22 0500  Weight: 96.8 kg 94.3 kg 94.1 kg     9. A Fib w/RVR: Monitor HR TID--continue Cardizem 180mg  QD and Metoprolol 100mg  QD 10. HTN: BPs with some lability, 130s-160s SBP -12/05/22 monitor for now, Losartan decreased 11/29/22, could consider going back to home dosing if appropriate -12/06/22 some lability but overall stable, monitor Vitals:   12/04/22 1559 12/04/22 1646 12/04/22 1940 12/05/22 0343  BP: 137/67 137/67 139/72 (!) 157/67   12/05/22 0843 12/05/22 0845 12/05/22 1716 12/05/22 1729  BP: (!) 161/65 (!) 161/65 137/72 (!) 161/65   12/05/22 1940 12/06/22 0304  BP: 127/81 134/70     11.  T1DM: Hgb A1c- 7.4. Monitor BS ac/hs and use SSI for elevated BS             -continue insulin glargline 15 units daily.  -May need to add meal coverage-->used 5- 7units tid prn PTA.  -12/05/22 CBGs 170s-200s, will increase Semglee to 16U QD, monitor -12/06/22 CBGs variable but  this morning 108; continue to monitor on this regimen, consider adding short acting daytime/mealtime coverage if trend continues CBG (last 3)  Recent Labs    12/05/22 1712 12/05/22 2104 12/06/22 0607  GLUCAP 221* 254* 108*      12. Chronic bilateral shoulder and left knee pain: Oxycodone prn -Refusing voltaren gel for local measures-->encourage use             -Aquathermia for local measures.              -L knee: Follows OP ortho, no benefit from injections in the past 13. Upper respiratory infection, Nocturnal hypoxia: Question sleep apnea.  -Encourage pulmonary hygiene -Respiratory viral panel negative x2 inpatient -Remains with cough, intermittent SOB - add PRN inhaler, monitor 14.  Pre-renal azotemia: Likely due to diuresis. BUN up but SCr improving.   -Continue to monitor.  -12/05/22 labs from 12/03/22 with BUN 27, SCr 0.66; monitor on labs on Mon 12/07/22 15. Overactive bladder, incontinence: hx of, on Myrbetriq 50mg  QD; now off purewick -12/05/22 having increased frequency and incontinence, ordered U/A, UCx, and scheduled voiding program -12/06/22 U/A c/w UTI, see #16 16. UTI 12/06/22:  -U/A c/w UTI, ordered Macrobid 100mg  BID x5 days (12/06/22>>12/10/22 last dose), UCx pending    LOS: 2 days A FACE TO [place] 12/06/2022, 7:05 AM

## 2022-12-07 DIAGNOSIS — R5381 Other malaise: Secondary | ICD-10-CM | POA: Diagnosis not present

## 2022-12-07 DIAGNOSIS — R42 Dizziness and giddiness: Secondary | ICD-10-CM

## 2022-12-07 DIAGNOSIS — N3000 Acute cystitis without hematuria: Secondary | ICD-10-CM | POA: Diagnosis not present

## 2022-12-07 DIAGNOSIS — E876 Hypokalemia: Secondary | ICD-10-CM

## 2022-12-07 DIAGNOSIS — I10 Essential (primary) hypertension: Secondary | ICD-10-CM

## 2022-12-07 LAB — URINE CULTURE: Culture: >=100000 — AB

## 2022-12-07 LAB — CBC WITH DIFFERENTIAL/PLATELET
Abs Immature Granulocytes: 0.01 10*3/uL (ref 0.00–0.07)
Basophils Absolute: 0 10*3/uL (ref 0.0–0.1)
Basophils Relative: 1 %
Eosinophils Absolute: 0.3 10*3/uL (ref 0.0–0.5)
Eosinophils Relative: 6 %
HCT: 46.5 % — ABNORMAL HIGH (ref 36.0–46.0)
Hemoglobin: 14.4 g/dL (ref 12.0–15.0)
Immature Granulocytes: 0 %
Lymphocytes Relative: 33 %
Lymphs Abs: 1.6 10*3/uL (ref 0.7–4.0)
MCH: 26.4 pg (ref 26.0–34.0)
MCHC: 31 g/dL (ref 30.0–36.0)
MCV: 85.3 fL (ref 80.0–100.0)
Monocytes Absolute: 0.5 10*3/uL (ref 0.1–1.0)
Monocytes Relative: 11 %
Neutro Abs: 2.3 10*3/uL (ref 1.7–7.7)
Neutrophils Relative %: 49 %
Platelets: 197 10*3/uL (ref 150–400)
RBC: 5.45 MIL/uL — ABNORMAL HIGH (ref 3.87–5.11)
RDW: 16.3 % — ABNORMAL HIGH (ref 11.5–15.5)
WBC: 4.7 10*3/uL (ref 4.0–10.5)
nRBC: 0 % (ref 0.0–0.2)

## 2022-12-07 LAB — GLUCOSE, CAPILLARY
Glucose-Capillary: 173 mg/dL — ABNORMAL HIGH (ref 70–99)
Glucose-Capillary: 116 mg/dL — ABNORMAL HIGH (ref 70–99)
Glucose-Capillary: 158 mg/dL — ABNORMAL HIGH (ref 70–99)
Glucose-Capillary: 185 mg/dL — ABNORMAL HIGH (ref 70–99)

## 2022-12-07 LAB — COMPREHENSIVE METABOLIC PANEL
ALT: 27 U/L (ref 0–44)
AST: 43 U/L — ABNORMAL HIGH (ref 15–41)
Albumin: 2.5 g/dL — ABNORMAL LOW (ref 3.5–5.0)
Alkaline Phosphatase: 111 U/L (ref 38–126)
Anion gap: 6 (ref 5–15)
BUN: 12 mg/dL (ref 8–23)
CO2: 32 mmol/L (ref 22–32)
Calcium: 8.8 mg/dL — ABNORMAL LOW (ref 8.9–10.3)
Chloride: 96 mmol/L — ABNORMAL LOW (ref 98–111)
Creatinine, Ser: 0.71 mg/dL (ref 0.44–1.00)
GFR, Estimated: >60 mL/min (ref >60)
Glucose, Bld: 181 mg/dL — ABNORMAL HIGH (ref 70–99)
Potassium: 3 mmol/L — ABNORMAL LOW (ref 3.5–5.1)
Sodium: 134 mmol/L — ABNORMAL LOW (ref 135–145)
Total Bilirubin: 0.8 mg/dL (ref 0.3–1.2)
Total Protein: 5.8 g/dL — ABNORMAL LOW (ref 6.5–8.1)

## 2022-12-07 MED ORDER — MECLIZINE HCL 25 MG PO TABS: 12.5000 mg | ORAL_TABLET | Freq: Three times a day (TID) | ORAL | Status: DC | PRN

## 2022-12-07 MED ORDER — POTASSIUM CHLORIDE CRYS ER 20 MEQ PO TBCR
30.0000 meq | EXTENDED_RELEASE_TABLET | Freq: Two times a day (BID) | ORAL | Status: DC
  Administered 2022-12-07 – 2022-12-10 (×7): 30 meq via ORAL
  Filled 2022-12-07 (×7): qty 1

## 2022-12-07 NOTE — Progress Notes (Signed)
Inpatient Rehabilitation  Patient information reviewed and entered into eRehab system by Jezabelle Chisolm M. Julio Storr, M.A., CCC/SLP, PPS Coordinator.  Information including medical coding, functional ability and quality indicators will be reviewed and updated through discharge.    

## 2022-12-07 NOTE — Progress Notes (Signed)
Physical Therapy Session Note  Patient Details  Name: [name] MRN: [id number] Date of Birth: 04-02-1951  Today's Date: 12/07/2022 PT Individual Time: 1344-1410 PT Individual Time Calculation (min): 26 min  Today's Date: 12/07/2022 PT Missed Time: 19 Minutes Missed Time Reason: Other (Comment) (scheduling error)  Short Term Goals: Week 1:  PT Short Term Goal 1 (Week 1): pt will be mod assist in slide board transfers PT Short Term Goal 2 (Week 1): pt will be mod assist with bed mobility PT Short Term Goal 3 (Week 1): pt will be maxA x1 w/ sit to stand trasnfer  Skilled Therapeutic Interventions/Progress Updates:   Arrived late to session due to scheduling error. Received pt sitting upright in bed reporting urgent need to void - retrieved bedpan due to urgency, removed pants with max A, and pt rolled R with max A to place bedpan. Pt able to control bed settings to get in optimal position to void - noted dark cloudy urine; pt reports having UTI. Performed anterior peri-care with set up assist, then rolled off bedpan to R with mod A and dependent for posterior peri-care. Donned clean brief and pants in supine with max A via rolling multiple times - pt able to assist more with pulling pants up on L side > R due to reports of 6/10 R shoulder pain. Pt able to roll with as little as min A using bedrails but required up to max A when anxious. Pt then scooted to [facility] with max A using Trendelenburg bed position with LEs in hooklying and pulling on headboard with L shoulder, although had difficulty maintaining hooklying. Concluded session with pt semi-reclined in bed, needs within reach, and bed alarm on. 19 minutes missed of skilled physical therapy.   Therapy Documentation Precautions:  Precautions Precautions: Shoulder Type of Shoulder Precautions: R-shoulder pain with ROM/WB Precaution Comments: History of frequent falls at home. Restrictions Weight Bearing Restrictions: No  Therapy/Group:  Individual Therapy Alfonse Alpers PT, DPT  12/07/2022, 1:36 PM

## 2022-12-07 NOTE — Progress Notes (Signed)
Physical Therapy Session Note  Patient Details  Name: [name] MRN: [id number] Date of Birth: 06/05/1951  Today's Date: 12/07/2022 PT Individual Time: 0810-0905 PT Individual Time Calculation (min): 55 min   Short Term Goals: Week 1:  PT Short Term Goal 1 (Week 1): pt will be mod assist in slide board transfers PT Short Term Goal 2 (Week 1): pt will be mod assist with bed mobility PT Short Term Goal 3 (Week 1): pt will be maxA x1 w/ sit to stand trasnfer  Skilled Therapeutic Interventions/Progress Updates:    Chart reviewed and pt agreeable to therapy. Pt received semi-reclined in bed with no c/o pain. Also of note, pt required toileting, so pt placed on bedpan by RN and PT returned after 10 mins. Session focused on functional transfers to promote home mobility. Pt initiated session with transfer to EOB using ModA + bed features. Pt then set up in front of STEDY with PT placing gait belt, and pt required MaxA for forward scoot at EOB. Pt noted to have limited RUE and RLE mobility. Also of note, pt displayed posterior tilt until STEDY in place for forward handhold.  In STEDY, pt attempted to stand with MaxA, but was unable to lift off bed. PT changed out STEDY for Denna Haggard. In Denna Haggard, pt was able to scoot further forward with MaxA with better hip clearance. Pt made attempt to stand with MaxA. Pt was only able to come to partial STEDY long enough for PT to place L hip support. Pt then became highly anxious and said she felt she would fall. PT reassured pt that she was secure in STEDY and PT had control of her balance. Pt continued to express strong fear of standing despite upright posture and sitting balance with CGA. Pt was able to complete another partial stand with MaxA for PT to place R hip support. Pt then sat in STEDY with CGA for balance. Pt became highly anxious that she felt off balance, though displayed no sign of swaying or tilting. PT attempted to help pt stand with MaxA, but pt  fearful to stand. PT called for assistance, and pt required MaxA x3 for stand and return sit to bed from STEDY. Pt then required MaxA x2 for return to supine in bed. PT and pt discussed need for further investigation into pt c/o unsteadiness and fear of falling. Pt polite and agreeable. At end of session, pt was left semi-reclined in bed with alarm engaged, nurse call bell and all needs in reach.     Therapy Documentation Precautions:  Precautions Precautions: Shoulder Type of Shoulder Precautions: R-shoulder pain with ROM/WB Precaution Comments: History of frequent falls at home. Restrictions Weight Bearing Restrictions: No   Therapy/Group: Individual Therapy  [name] 12/07/2022, 12:41 PM
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
## 2022-12-07 NOTE — Progress Notes (Signed)
Patient ID: [name], female   DOB: 1951/05/06, 72 y.o.   MRN: [id number] Met with the patient to review current situation, rehab process, team conference and plan of care. Reviewed secondary issues including HF, Afib, DM, HTN, HLD along with medications and dietary modification recommendations. Patient noted treatment for arthritis in shoulder is working however concerned that she is not moving as well as PTA and has a fear of fall backwards that is limiting her ability to use the stedy. Continue to follow along to address educational needs to facilitate preparation for discharge. Margarito Liner

## 2022-12-07 NOTE — Progress Notes (Signed)
Occupational Therapy Session Note  Patient Details  Name: [name] MRN: [id number] Date of Birth: 1951-05-13  Session 1 Today's Date: 12/07/2022 OT Individual Time: 8413-2440 OT Individual Time Calculation (min): 54 min    Session 2  Today's Date: 12/07/2022 OT Individual Time: 1027-2536 OT Individual Time Calculation (min): 34 min    Short Term Goals: Week 1:  OT Short Term Goal 1 (Week 1): Pt will complete STS transfer with Mod A + LRAD in preparation for standing ADL tasks. OT Short Term Goal 2 (Week 1): Pt will complete simple bathing activities with consistent Mod A + LRAD. OT Short Term Goal 3 (Week 1): Pt will complete LB dressing with Mod A + LRAD.  Skilled Therapeutic Interventions/Progress Updates:    Session 1 Pt received supine with no c/o pain, agreeable to OT session. She was very anxious during session but willing to complete all activities. She came to EOB with increased time and min A overall using bed rails. Initiated stand but pt reporting need to urinate. Suggested BSC but pt stating it is occurring too quickly. She returned to supine, requring mod A to reposition. Bedpan placed, pt rolling with mod A overall. Poor B knee flexion and R shoulder limitations at baseline limiting. She voided urine and a new brief was donned with total A. She came back to EOB with mod vc for technique and mod A. She attempted to stand from the EOB with mod A and had great hip clearance but d/t very high fear of falling, had difficulty bringing her hips fully to upright position. She returned to EOB and +2 assist was acquired to assist. She was able to stand with no more than mod +2 assist to rotate pelvis forward enough to bring stedy pads forward. She was transferred to the recliner. She was left sitting up in the recliner with all needs met.    Session 2 Pt received supine with no c/o pain, agreeable to OT session. Pt completed bed mobility to EOB with mod instructional cueing and mod  A. She sat EOB and worked on static sitting balance while OT assisted with hair care. She was able to sit statically for 10 min without LOB. She completed 2x10 modified sit ups for improving trunk control and bed mobility independence. She returned to supine with mod A and was left with all needs met.    Therapy Documentation Precautions:  Precautions Precautions: Shoulder Type of Shoulder Precautions: R-shoulder pain with ROM/WB Precaution Comments: History of frequent falls at home. Restrictions Weight Bearing Restrictions: No   Therapy/Group: Individual Therapy  Curtis Sites 12/07/2022, 6:22 AM

## 2022-12-07 NOTE — Progress Notes (Signed)
PROGRESS NOTE   Subjective/Complaints: Pt was standing in steady today when I first came it. Steady was not working correctly and she required some help to sit down. She reports  feeling off balance when standing. She says she was undergoing workup for vertigo as outpatient.   ROS: +urinary frequency, +insomnia-improving. +dizziness, Denies fevers, chills, CP, SOB, abd pain, N/V/D/C, dysuria, new/worsening numbness, tingling, focal weakness, or any other complaints at this time.   Objective:   No results found. Recent Labs    12/07/22 0554  WBC 4.7  HGB 14.4  HCT 46.5*  PLT 197   Recent Labs    12/07/22 0554  NA 134*  K 3.0*  CL 96*  CO2 32  GLUCOSE 181*  BUN 12  CREATININE 0.71  CALCIUM 8.8*    Urinalysis    Component Value Date/Time   COLORURINE YELLOW 12/05/2022 1630   APPEARANCEUR CLOUDY (A) 12/05/2022 1630   LABSPEC 1.008 12/05/2022 1630   PHURINE 7.0 12/05/2022 1630   GLUCOSEU NEGATIVE 12/05/2022 1630   HGBUR MODERATE (A) 12/05/2022 1630   BILIRUBINUR NEGATIVE 12/05/2022 1630   KETONESUR NEGATIVE 12/05/2022 1630   PROTEINUR NEGATIVE 12/05/2022 1630   NITRITE NEGATIVE 12/05/2022 1630   LEUKOCYTESUR LARGE (A) 12/05/2022 1630  Urine Micro 12/05/22: Bacteria: few RBC: 21-50 Squamous: 0-5 WBC clumps: present WBC: >50  UCx 12/05/22: pending     Intake/Output Summary (Last 24 hours) at 12/07/2022 0806 Last data filed at 12/06/2022 2234 Gross per 24 hour  Intake 949 ml  Output 400 ml  Net 549 ml         Physical Exam: Vital Signs Blood pressure (!) 161/76, pulse 88, temperature 98 F (36.7 C), temperature source Oral, resp. rate 18, height 5\' 7"  (1.702 m), weight 92.8 kg, SpO2 93 %.  Physical Exam Constitution: Appropriate appearance for age. No apparent distress +Obese, working with therapy HEENT: R pupil fixed, dilated, blind; L pupil reactive, EOM grossly intact.  Resp: CTAB. No rales,  rhonchi, or wheezing. Cardio: Irregular rhythm, regular rate. No peripheral edema. Abdomen: Nondistended. Nontender. +bowel sounds. Psych: Appropriate mood and affect. pleasant Skin: BLE with ichthyosis. R foot mepilex dressing with open wound, clean base, mild bloody drainage. Finger bilateral hand flushed appearing. Trace b/l  edema    Neurologic/MsK:   DTRs: Reflexes were 2+ in bilateral UEs; absent bilateral patella; 2+ BL achilles. Babinsky: flexor responses b/l.   Hoffmans: negative b/l Sensory exam: revealed reduced sensation to light touch over right plantar foot and heel. . Motor exam:       R Genu Valgum. ROM limited to 90 degrees R shoulder, -5 degrees R dorsiflexion      Strength:                RUE: 4/5 SA, 5-/5 EF, 5-/5 EE, 5/5 WE, 5/5 FF, 5/5 FA                 LUE: 5/5 SA, 5/5 EF, 5/5 EE, 5/5 WE, 5/5 FF, 5/5 FA                 RLE: 3+/5 HF, 4+/5 KE, 4/5 DF,  5/5 PF  LLE:  3-/5 HF, 3+/5 KE, 5/5 DF,  5/5 PF  Coordination: Fine motor coordination was normal.      Assessment/Plan: 1. Functional deficits which require 3+ hours per day of interdisciplinary therapy in a comprehensive inpatient rehab setting. Physiatrist is providing close team supervision and 24 hour management of active medical problems listed below. Physiatrist and rehab team continue to assess barriers to discharge/monitor patient progress toward functional and medical goals  Care Tool:  Bathing    Body parts bathed by patient: Right arm, Chest, Abdomen, Front perineal area, Right upper leg, Left upper leg, Face   Body parts bathed by helper: Left arm, Buttocks, Right lower leg, Left lower leg     Bathing assist Assist Level: Moderate Assistance - Patient 50 - 74%     Upper Body Dressing/Undressing Upper body dressing   What is the patient wearing?: Pull over shirt    Upper body assist Assist Level: Minimal Assistance - Patient > 75%    Lower Body Dressing/Undressing Lower body  dressing      What is the patient wearing?: Incontinence brief     Lower body assist Assist for lower body dressing: Dependent - Patient 0%     Toileting Toileting    Toileting assist Assist for toileting: Total Assistance - Patient < 25%     Transfers Chair/bed transfer  Transfers assist     Chair/bed transfer assist level: 2 Helpers     Locomotion Ambulation   Ambulation assist   Ambulation activity did not occur: Safety/medical concerns          Walk 10 feet activity   Assist  Walk 10 feet activity did not occur: Safety/medical concerns        Walk 50 feet activity   Assist Walk 50 feet with 2 turns activity did not occur: Safety/medical concerns         Walk 150 feet activity   Assist Walk 150 feet activity did not occur: Safety/medical concerns         Walk 10 feet on uneven surface  activity   Assist Walk 10 feet on uneven surfaces activity did not occur: Safety/medical concerns         Wheelchair     Assist Is the patient using a wheelchair?: Yes Type of Wheelchair: Manual    Wheelchair assist level: Dependent - Patient 0% Max wheelchair distance: 150    Wheelchair 50 feet with 2 turns activity    Assist        Assist Level: Dependent - Patient 0%   Wheelchair 150 feet activity     Assist      Assist Level: Dependent - Patient 0%   Blood pressure (!) 161/76, pulse 88, temperature 98 F (36.7 C), temperature source Oral, resp. rate 18, height 5\' 7"  (1.702 m), weight 92.8 kg, SpO2 93 %.  Medical Problem List and Plan: 1. Functional deficits secondary to debility due to acute on chronic HFpEF, atrial fibrillation with RVR             -patient may shower             -ELOS/Goals: 10-14 days, ST, PT, OT goals  2.  Antithrombotics: -DVT/anticoagulation:  Lovenox 40 mg daily (No full-dose AC d/t Hx SAH 2007)             -antiplatelet therapy: ASA 325 daily per IM until OP cards f/u 3. Pain  Management:  Oxycodone or tramadol prn.  -encourage use of Voltaren gel  2g QID for OA right shoulder/right knee.   4. Mood/Behavior/Sleep: LCSW to follow for evaluation and support.              -Melatonin prn--12/05/22 ordered as scheduled 5mg  QHS, Trazodone PRN   -12/06/22 sleeping improving, continue current regimen             -antipsychotic agents: N/A  -Nortriptyline 10mg  QHS, Neurontin 100mg  QHS 5. Neuropsych/cognition: This patient is capable of making decisions on her own behalf. 6. Skin/Wound Care: Add lotion to BLE and hands             -routine pressure relief measures.  7. Fluids/Electrolytes/Nutrition: Monitor I/O. Intake improving. Monitor CMP 12/07/22 and weekly BMPs starting 12/14/22 -May need to stay dry to avoid fluid overload. 8. Acute on chronic diastolic CHF: Strict I/O w/daily weights. -Cardiac diet. Monitor for signs of overload.  -continue Lasix 40mg  QD, Aldactone 12.5mg  QD, metoprolol 100mg  QD, pravastatin 40mg  QD and Losartan 25mg  QD -monitor for recurrent hyperkalemia (Losartan dose decreased 01/14) -12/06/22 weight stable, monitor, not too volume overloaded today Filed Weights   12/05/22 0505 12/06/22 0500 12/07/22 0430  Weight: 94.3 kg 94.1 kg 92.8 kg     9. A Fib w/RVR: Monitor HR TID--continue Cardizem 180mg  QD and Metoprolol 100mg  QD 10. HTN: BPs with some lability, 130s-160s SBP -12/05/22 monitor for now, Losartan decreased 11/29/22, could consider going back to home dosing if appropriate -1/22 few elevated but overall controlled, continue to monitor Vitals:   12/04/22 1646 12/04/22 1940 12/05/22 0343 12/05/22 0843  BP: 137/67 139/72 (!) 157/67 (!) 161/65   12/05/22 0845 12/05/22 1716 12/05/22 1729 12/05/22 1940  BP: (!) 161/65 137/72 (!) 161/65 127/81   12/06/22 0304 12/06/22 1531 12/06/22 1948 12/07/22 0209  BP: 134/70 (!) 116/59 114/86 (!) 161/76     11.  T1DM: Hgb A1c- 7.4. Monitor BS ac/hs and use SSI for elevated BS             -continue insulin  glargline 15 units daily.  -May need to add meal coverage-->used 5- 7units tid prn PTA.  -12/05/22 CBGs 170s-200s, will increase Semglee to 16U QD, monitor -12/06/22 CBGs variable but this morning 108; continue to monitor on this regimen, consider adding short acting daytime/mealtime coverage if trend continues CBG (last 3)  Recent Labs    12/06/22 2100 12/07/22 0558 12/07/22 1133  GLUCAP 271* 173* 116*     12. Chronic bilateral shoulder and left knee pain: Oxycodone prn -Refusing voltaren gel for local measures-->encourage use             -Aquathermia for local measures.              -L knee: Follows OP ortho, no benefit from injections in the past 13. Upper respiratory infection, Nocturnal hypoxia: Question sleep apnea.  -Encourage pulmonary hygiene -Respiratory viral panel negative x2 inpatient -Remains with cough, intermittent SOB - add PRN inhaler, monitor 14.  Pre-renal azotemia: Likely due to diuresis. BUN up but SCr improving.   -Continue to monitor.  -12/05/22 labs from 12/03/22 with BUN 27, SCr 0.66; monitor on labs on Mon 12/07/22 15. Overactive bladder, incontinence: hx of, on Myrbetriq 50mg  QD; now off purewick -12/05/22 having increased frequency and incontinence, ordered U/A, UCx, and scheduled voiding program -12/06/22 U/A c/w UTI, see #16 16. UTI 12/06/22:  -U/A c/w UTI, ordered Macrobid 100mg  BID x5 days (12/06/22>>12/10/22 last dose), UCx pending- Echoli sensitive to macrobid  17.  Hypokalemia  -47meq x2, recheck tomorrow  18. Dizziness -1/22 Will start meclizine 12.5mg  PRN, check orthostatic VS    LOS: 3 days A FACE TO FACE EVALUATION WAS PERFORMED  Jennye Boroughs 12/07/2022, 8:06 AM

## 2022-12-07 NOTE — IPOC Note (Signed)
Overall Plan of Care [facility]) Patient Details Name: [name] MRN: [id number] DOB: May 02, 1951  Admitting Diagnosis: [facility] Problems: Principal Problem:   Debility     Functional Problem List: Nursing Bladder, Bowel, Pain, Endurance, Medication Management, Safety  PT Balance, Endurance, Motor, Pain, Perception, Sensory, Safety  OT Balance, Endurance, Pain, Perception, Sensory, Vision  SLP    TR         Basic ADL's: OT Bathing, Dressing, Toileting     Advanced  ADL's: OT       Transfers: PT Bed Mobility, Bed to Chair, Car  OT Toilet, Tub/Shower     Locomotion: PT Ambulation, Emergency planning/management officer, Stairs     Additional Impairments: OT    SLP        TR      Anticipated Outcomes Item Anticipated Outcome  Self Feeding    Swallowing      Basic self-care  Min A  Toileting  Min A   Bathroom Transfers Min A  Bowel/Bladder  manage bowel and bladder w mod I assist  Transfers     Locomotion     Communication     Cognition     Pain  < 4 with prns  Safety/Judgment  manage w cues   Therapy Plan: PT Intensity: Minimum of 1-2 x/day ,45 to 90 minutes PT Frequency: 5 out of 7 days PT Duration Estimated Length of Stay: 3 weeks OT Intensity: Minimum of 1-2 x/day, 45 to 90 minutes OT Frequency: 5 out of 7 days OT Duration/Estimated Length of Stay: 17-21 days     Team Interventions: Nursing Interventions Bladder Management, Disease Management/Prevention, Medication Management, Discharge Planning, Patient/Family Education, Bowel Management, Pain Management  PT interventions Ambulation/gait training, Balance/vestibular training, Discharge planning, Pain management, Skin care/wound management, Therapeutic Activities, UE/LE Coordination activities, UE/LE Strength taining/ROM, Wheelchair propulsion/positioning, Neuromuscular re-education, Functional mobility training, Patient/family education, Cognitive remediation/compensation, Disease  management/prevention, Therapeutic Exercise  OT Interventions Balance/vestibular training, Discharge planning, DME/adaptive equipment instruction, Functional mobility training, Pain management, Patient/family education, Self Care/advanced ADL retraining, Therapeutic Activities, Therapeutic Exercise, UE/LE Strength taining/ROM, Visual/perceptual remediation/compensation  SLP Interventions    TR Interventions    SW/CM Interventions Discharge Planning, Psychosocial Support, Patient/Family Education   Barriers to Discharge MD  Medical stability and Home enviroment access/loayout  Nursing Decreased caregiver support 1 level 3 ste right rail w spouse;Husband is retired and can assist.  Dtr lives in basement and dtr works.  PT      OT Incontinence, Lack of/limited family support, Weight    SLP      SW Decreased caregiver support, Insurance for SNF coverage     Team Discharge Planning: Destination: PT-Home ,OT- Home , SLP-  Projected Follow-up: PT-Home health PT, OT-  Home health OT, SLP-  Projected Equipment Needs: PT-Sliding board, Wheelchair (measurements), Rolling walker with 5" wheels, OT- To be determined, SLP-  Equipment Details: PT- , OT-  Patient/family involved in discharge planning: PT- Patient,  OT-Patient, SLP-   MD ELOS:10-14 Medical Rehab Prognosis:  Excellent Assessment: The patient has been admitted for CIR therapies with the diagnosis of debility due to acute on chronic HFpEF, atrial fibrillation with RVR . The team will be addressing functional mobility, strength, stamina, balance, safety, adaptive techniques and equipment, self-care, bowel and bladder mgt, patient and caregiver education. Goals have been set at supervision. Anticipated discharge destination is home.        See Team Conference Notes for weekly updates to the plan of care

## 2022-12-07 NOTE — Progress Notes (Signed)
Inpatient Rehabilitation Center Individual Statement of Services  Patient Name:  [name]  Date:  12/07/2022  Welcome to the [facility].  Our goal is to provide you with an individualized program based on your diagnosis and situation, designed to meet your specific needs.  With this comprehensive rehabilitation program, you will be expected to participate in at least 3 hours of rehabilitation therapies Monday-Friday, with modified therapy programming on the weekends.  Your rehabilitation program will include the following services:  Physical Therapy (PT), Occupational Therapy (OT), 24 hour per day rehabilitation nursing, Therapeutic Recreaction (TR), Care Coordinator, Rehabilitation Medicine, Nutrition Services, and Pharmacy Services  Weekly team conferences will be held on Wednesday to discuss your progress.  Your Inpatient Rehabilitation Care Coordinator will talk with you frequently to get your input and to update you on team discussions.  Team conferences with you and your family in attendance may also be held.  Expected length of stay: 17-21 days  Overall anticipated outcome: min level of assist  Depending on your progress and recovery, your program may change. Your Inpatient Rehabilitation Care Coordinator will coordinate services and will keep you informed of any changes. Your Inpatient Rehabilitation Care Coordinator's name and contact numbers are listed  below.  The following services may also be recommended but are not provided by the [facility]:   [place] will be made to provide these services after discharge if needed.  Arrangements include referral to agencies that provide these services.  Your insurance has been verified to be:  [place] primary doctor is:  Leanna Battles  Pertinent information will be shared with your doctor and your insurance  company.  Inpatient Rehabilitation Care Coordinator:  Ovidio Kin, [place] or Emilia Beck  Information discussed with and copy given to patient by: Elease Hashimoto, 12/07/2022, 9:58 AM

## 2022-12-08 DIAGNOSIS — E876 Hypokalemia: Secondary | ICD-10-CM | POA: Diagnosis not present

## 2022-12-08 DIAGNOSIS — F411 Generalized anxiety disorder: Secondary | ICD-10-CM

## 2022-12-08 DIAGNOSIS — I10 Essential (primary) hypertension: Secondary | ICD-10-CM | POA: Diagnosis not present

## 2022-12-08 DIAGNOSIS — R5381 Other malaise: Secondary | ICD-10-CM | POA: Diagnosis not present

## 2022-12-08 DIAGNOSIS — R7989 Other specified abnormal findings of blood chemistry: Secondary | ICD-10-CM

## 2022-12-08 LAB — BASIC METABOLIC PANEL
Anion gap: 12 (ref 5–15)
BUN: 9 mg/dL (ref 8–23)
CO2: 28 mmol/L (ref 22–32)
Calcium: 9 mg/dL (ref 8.9–10.3)
Chloride: 96 mmol/L — ABNORMAL LOW (ref 98–111)
Creatinine, Ser: 0.67 mg/dL (ref 0.44–1.00)
GFR, Estimated: >60 mL/min (ref >60)
Glucose, Bld: 226 mg/dL — ABNORMAL HIGH (ref 70–99)
Potassium: 3.6 mmol/L (ref 3.5–5.1)
Sodium: 136 mmol/L (ref 135–145)

## 2022-12-08 LAB — GLUCOSE, CAPILLARY
Glucose-Capillary: 186 mg/dL — ABNORMAL HIGH (ref 70–99)
Glucose-Capillary: 285 mg/dL — ABNORMAL HIGH (ref 70–99)
Glucose-Capillary: 250 mg/dL — ABNORMAL HIGH (ref 70–99)
Glucose-Capillary: 182 mg/dL — ABNORMAL HIGH (ref 70–99)

## 2022-12-08 MED ORDER — ALPRAZOLAM 0.25 MG PO TABS
0.2500 mg | ORAL_TABLET | Freq: Two times a day (BID) | ORAL | Status: DC | PRN
  Administered 2022-12-09 – 2022-12-27 (×12): 0.25 mg via ORAL
  Filled 2022-12-08 (×13): qty 1

## 2022-12-08 MED ORDER — LOSARTAN POTASSIUM 50 MG PO TABS
50.0000 mg | ORAL_TABLET | Freq: Every day | ORAL | Status: DC
  Administered 2022-12-09 – 2022-12-29 (×21): 50 mg via ORAL
  Filled 2022-12-08 (×21): qty 1

## 2022-12-08 NOTE — Progress Notes (Signed)
Occupational Therapy Session Note  Patient Details  Name: [name] MRN: [id number] Date of Birth: 09/29/1951  Today's Date: 12/08/2022 OT Individual Time: 6734-1937 OT Individual Time Calculation (min): 70 min    Short Term Goals: Week 1:  OT Short Term Goal 1 (Week 1): Pt will complete STS transfer with Mod A + LRAD in preparation for standing ADL tasks. OT Short Term Goal 2 (Week 1): Pt will complete simple bathing activities with consistent Mod A + LRAD. OT Short Term Goal 3 (Week 1): Pt will complete LB dressing with Mod A + LRAD.  Skilled Therapeutic Interventions/Progress Updates:    Patient received seated in wheelchair, very pleasant and agreeable to OT.  Patient indicates she had been texting her daughter to determine if she had any anxiety prior to this hospitalization - and she shared that her daughter laughed - and shared that she has longstanding anxiety issues.  Patient transported to gym and used slide board for level surface transfer out of and in to wheelchair with constant reassurance, +2 to stabilize chair, and mod assist for weight shift.  Patient verbalizing fear of falling - even when sitting solidly on firm surface.  Patient unable to appreciate when seated fully back in chair - asking repeatedly "Am I in the chair yet."  Patient unable to draw from her environment any cues to help with this due to level of anxiety.  Patient then became tearful after transfer - although easily redirected and calmed.   In unsupported sitting worked on components of movement needed for transitions - e.g. forward flexion, upright sitting, lateral weight shifts.  Patient left up in wheelchair in room with personal items in reach, bedside table in front, and safety belt in place and engaged.    Therapy Documentation Precautions:  Precautions Precautions: Shoulder Type of Shoulder Precautions: R-shoulder pain with ROM/WB Precaution Comments: History of frequent falls at  home. Restrictions Weight Bearing Restrictions: No  Pain:  Not scored - patient reports chronic pain in R shoulder with movement    Therapy/Group: Individual Therapy  Mariah Milling 12/08/2022, 12:52 PM

## 2022-12-08 NOTE — Progress Notes (Signed)
Physical Therapy Session Note  Patient Details  Name: [name] MRN: [id number] Date of Birth: 07/08/1951  Today's Date: 12/08/2022 PT Individual Time: 1305-1430 PT Individual Time Calculation (min): 85 min   Short Term Goals: Week 1:  PT Short Term Goal 1 (Week 1): pt will be mod assist in slide board transfers PT Short Term Goal 2 (Week 1): pt will be mod assist with bed mobility PT Short Term Goal 3 (Week 1): pt will be maxA x1 w/ sit to stand trasnfer  Skilled Therapeutic Interventions/Progress Updates: Pt presented in w/c completing lunch agreeable to therapy. Pt states pain controlled currently 3/10, rest breaks and repositioning provided during session due to increased anxiety and RUE. Pt noted to not have pants on due to previous transfer to/from [facility] for urinary void. PTA spent several minutes prior to mobilizing pt discussing current increased anxiety levels with mobility with PTA providing gentle encouragement to breathe through mobility and that all mobility to be performed in safest manner possible. Pt verbalized understanding. PTA threaded pants total A and set up pt with Stedy. Pt able to scoot anteriorly with modA and verbal cues for sequencing. Pt stood in [place] with +2 assist to allow PTA to pull pants over hips. Pt then with episode of increased anxiety with pt stating "I'm falling" (although pt has not moved). Pt then noted to push into extension although pt stating felt like she was falling to L. Pt then returned to sitting in w/c. Discussed with pt what PTA observed and that she tended to push backwards when anxious. Pt then transported to rehab gym and was able to scoot anteriorly with modA and was able to lean to R to allow PTA to place Slide board under pt. Pt then performed Slide board transfer to mat with maxA x 2 due to pt leaning back with anxiety/fear and not able to get good placement of feet under body. Pt required several minutes to calm down and reposition. Once  completed pt participated in sitting lateral leans followed by ball taps in unsupported sitting for sitting dynamic balance. After several minutes pt noted to be more comfortable sitting unsupported and was able to perform reaching tasks and correct back to midline with close supervision. Discussed findings with pt that she was able to perform reaches in all directions without increased anxiety. Pt then set up with Slide board and performed Slide board transfer to w/c with maxA x 2. Pt transported back to room and pt indicated need for urinary void. Pt set up with Stedy and performed Sit to stand in [place] with maxA x 2 however pt noted to have improved posture. Pt transferred to [facility] and had continent urinary void. Pt was able to stand in same manner and was total A for peri-care. Pt transferred back to w/c and remained in w/c at end of session. Pt set up in w/c and left with call bell within reach and needs met.      Therapy Documentation Precautions:  Precautions Precautions: Shoulder Type of Shoulder Precautions: R-shoulder pain with ROM/WB Precaution Comments: History of frequent falls at home. Restrictions Weight Bearing Restrictions: No General:   Vital Signs: Therapy Vitals Temp: 98.2 F (36.8 C) Pulse Rate: 69 Resp: 16 BP: 117/61 Oxygen Therapy O2 Device: Room Air Pain:   Mobility:   Locomotion :    Trunk/Postural Assessment :    Balance:   Exercises:   Other Treatments:      Therapy/Group: Individual Therapy  Dalynn Jhaveri 12/08/2022,  4:26 PM

## 2022-12-08 NOTE — Progress Notes (Signed)
PROGRESS NOTE   Subjective/Complaints: She reports having severe anxiety when standing with therapy. She also continues to feel off balance when she is standing. She feels like she is not standing straight up and feels like she is leaning even when therapy notes she is standing upright.   She does not feel like the room is spinning.   ROS: +urinary frequency, +insomnia-improving. +dizziness, Denies fevers, chills, CP, SOB, abd pain, N/V/D/C, dysuria, new/worsening numbness, tingling, focal weakness, or any other complaints at this time.  + anxiety  Objective:   No results found. Recent Labs    12/07/22 0554  WBC 4.7  HGB 14.4  HCT 46.5*  PLT 197    Recent Labs    12/07/22 0554  NA 134*  K 3.0*  CL 96*  CO2 32  GLUCOSE 181*  BUN 12  CREATININE 0.71  CALCIUM 8.8*    Urinalysis    Component Value Date/Time   COLORURINE YELLOW 12/05/2022 1630   APPEARANCEUR CLOUDY (A) 12/05/2022 1630   LABSPEC 1.008 12/05/2022 1630   PHURINE 7.0 12/05/2022 1630   GLUCOSEU NEGATIVE 12/05/2022 1630   HGBUR MODERATE (A) 12/05/2022 1630   BILIRUBINUR NEGATIVE 12/05/2022 1630   KETONESUR NEGATIVE 12/05/2022 1630   PROTEINUR NEGATIVE 12/05/2022 1630   NITRITE NEGATIVE 12/05/2022 1630   LEUKOCYTESUR LARGE (A) 12/05/2022 1630  Urine Micro 12/05/22: Bacteria: few RBC: 21-50 Squamous: 0-5 WBC clumps: present WBC: >50  UCx 12/05/22: pending     Intake/Output Summary (Last 24 hours) at 12/08/2022 0819 Last data filed at 12/08/2022 0331 Gross per 24 hour  Intake 716 ml  Output 350 ml  Net 366 ml         Physical Exam: Vital Signs Blood pressure (!) 156/70, pulse 80, temperature 98.1 F (36.7 C), temperature source Oral, resp. rate 17, height 5\' 7"  (1.702 m), weight 94.8 kg, SpO2 92 %.  Physical Exam Constitution: Appropriate appearance for age. No apparent distress +Obese, working with therapy HEENT: R pupil fixed,  dilated, blind; L pupil reactive, EOM grossly intact.  Resp: CTAB. No rales, rhonchi, or wheezing. Cardio: Irregular rhythm, regular rate. No peripheral edema. Abdomen: Nondistended. Nontender. +bowel sounds. Psych: Appropriate mood and affect. pleasant Skin: BLE with ichthyosis. R foot mepilex dressing with open wound, clean base, mild bloody drainage. Finger bilateral hand flushed appearing. Trace b/l  edema    Neurologic/MsK:   DTRs: Reflexes were 2+ in bilateral UEs; absent bilateral patella; 2+ BL achilles. Babinsky: flexor responses b/l.   Hoffmans: negative b/l Sensory exam: revealed reduced sensation to light touch over right plantar foot and heel. . Motor exam:       R Genu Valgum. ROM limited to 90 degrees R shoulder, -5 degrees R dorsiflexion      Strength:                RUE: 4/5 SA, 5-/5 EF, 5-/5 EE, 5/5 WE, 5/5 FF, 5/5 FA                 LUE: 5/5 SA, 5/5 EF, 5/5 EE, 5/5 WE, 5/5 FF, 5/5 FA  RLE: 3+/5 HF, 4+/5 KE, 4/5 DF,  5/5 PF                 LLE:  3-/5 HF, 3+/5 KE, 5/5 DF,  5/5 PF  Coordination: Fine motor coordination was normal.      Assessment/Plan: 1. Functional deficits which require 3+ hours per day of interdisciplinary therapy in a comprehensive inpatient rehab setting. Physiatrist is providing close team supervision and 24 hour management of active medical problems listed below. Physiatrist and rehab team continue to assess barriers to discharge/monitor patient progress toward functional and medical goals  Care Tool:  Bathing    Body parts bathed by patient: Right arm, Chest, Abdomen, Front perineal area, Right upper leg, Left upper leg, Face   Body parts bathed by helper: Left arm, Buttocks, Right lower leg, Left lower leg     Bathing assist Assist Level: Moderate Assistance - Patient 50 - 74%     Upper Body Dressing/Undressing Upper body dressing   What is the patient wearing?: Pull over shirt    Upper body assist Assist Level:  Minimal Assistance - Patient > 75%    Lower Body Dressing/Undressing Lower body dressing      What is the patient wearing?: Incontinence brief     Lower body assist Assist for lower body dressing: Dependent - Patient 0%     Toileting Toileting    Toileting assist Assist for toileting: Total Assistance - Patient < 25%     Transfers Chair/bed transfer  Transfers assist     Chair/bed transfer assist level: 2 Helpers     Locomotion Ambulation   Ambulation assist   Ambulation activity did not occur: Safety/medical concerns          Walk 10 feet activity   Assist  Walk 10 feet activity did not occur: Safety/medical concerns        Walk 50 feet activity   Assist Walk 50 feet with 2 turns activity did not occur: Safety/medical concerns         Walk 150 feet activity   Assist Walk 150 feet activity did not occur: Safety/medical concerns         Walk 10 feet on uneven surface  activity   Assist Walk 10 feet on uneven surfaces activity did not occur: Safety/medical concerns         Wheelchair     Assist Is the patient using a wheelchair?: Yes Type of Wheelchair: Manual    Wheelchair assist level: Dependent - Patient 0% Max wheelchair distance: 150    Wheelchair 50 feet with 2 turns activity    Assist        Assist Level: Dependent - Patient 0%   Wheelchair 150 feet activity     Assist      Assist Level: Dependent - Patient 0%   Blood pressure (!) 156/70, pulse 80, temperature 98.1 F (36.7 C), temperature source Oral, resp. rate 17, height 5\' 7"  (1.702 m), weight 94.8 kg, SpO2 92 %.  Medical Problem List and Plan: 1. Functional deficits secondary to debility due to acute on chronic HFpEF, atrial fibrillation with RVR             -patient may shower             -ELOS/Goals: 10-14 days, ST, PT, OT goals  2.  Antithrombotics: -DVT/anticoagulation:  Lovenox 40 mg daily (No full-dose AC d/t Hx SAH 2007)              -  antiplatelet therapy: ASA 325 daily per IM until OP cards f/u 3. Pain Management:  Oxycodone or tramadol prn.  -encourage use of Voltaren gel 2g QID for OA right shoulder/right knee.   4. Mood/Behavior/Sleep: LCSW to follow for evaluation and support.              -Melatonin prn--12/05/22 ordered as scheduled 5mg  QHS, Trazodone PRN   -12/06/22 sleeping improving, continue current regimen             -antipsychotic agents: N/A  -Nortriptyline 10mg  QHS, Neurontin 100mg  QHS 5. Neuropsych/cognition: This patient is capable of making decisions on her own behalf.  -start xanax 0.25 prn for anxiety 6. Skin/Wound Care: Add lotion to BLE and hands             -routine pressure relief measures.  7. Fluids/Electrolytes/Nutrition: Monitor I/O. Intake improving. Monitor CMP 12/07/22 and weekly BMPs starting 12/14/22 -May need to stay dry to avoid fluid overload. 8. Acute on chronic diastolic CHF: Strict I/O w/daily weights. -Cardiac diet. Monitor for signs of overload.  -continue Lasix 40mg  QD, Aldactone 12.5mg  QD, metoprolol 100mg  QD, pravastatin 40mg  QD and Losartan 25mg  QD -monitor for recurrent hyperkalemia (Losartan dose decreased 01/14) -1/23 weights appear stable Filed Weights   12/06/22 0500 12/07/22 0430 12/08/22 0331  Weight: 94.1 kg 92.8 kg 94.8 kg     9. A Fib w/RVR: Monitor HR TID--continue Cardizem 180mg  QD and Metoprolol 100mg  QD 10. HTN: BPs with some lability, 130s-160s SBP -12/05/22 monitor for now, Losartan decreased 11/29/22, could consider going back to home dosing if appropriate -1/23 BP has been elevated, will increase losartan to 50mg   Vitals:   12/05/22 0843 12/05/22 0845 12/05/22 1716 12/05/22 1729  BP: (!) 161/65 (!) 161/65 137/72 (!) 161/65   12/05/22 1940 12/06/22 0304 12/06/22 1531 12/06/22 1948  BP: 127/81 134/70 (!) 116/59 114/86   12/07/22 0209 12/07/22 1701 12/07/22 1922 12/08/22 0324  BP: (!) 161/76 (!) 146/70 (!) 148/65 (!) 156/70     11.  T1DM: Hgb A1c- 7.4.  Monitor BS ac/hs and use SSI for elevated BS             -continue insulin glargline 15 units daily.  -May need to add meal coverage-->used 5- 7units tid prn PTA.  -12/05/22 CBGs 170s-200s, will increase Semglee to 16U QD, monitor -12/06/22 CBGs variable but this morning 108; continue to monitor on this regimen, consider adding short acting daytime/mealtime coverage if trend continues CBG (last 3)  Recent Labs    12/07/22 1727 12/07/22 2044 12/08/22 0557  GLUCAP 158* 185* 186*      12. Chronic bilateral shoulder and left knee pain: Oxycodone prn -Refusing voltaren gel for local measures-->encourage use             -Aquathermia for local measures.              -L knee: Follows OP ortho, no benefit from injections in the past 13. Upper respiratory infection, Nocturnal hypoxia: Question sleep apnea.  -Encourage pulmonary hygiene -Respiratory viral panel negative x2 inpatient -Remains with cough, intermittent SOB - add PRN inhaler, monitor 14.  Pre-renal azotemia: Likely due to diuresis. BUN up but SCr improving.   -Continue to monitor.  -1/22 BUN and CR WNL 15. Overactive bladder, incontinence: hx of, on Myrbetriq 50mg  QD; now off purewick -12/05/22 having increased frequency and incontinence, ordered U/A, UCx, and scheduled voiding program -12/06/22 U/A c/w UTI, see #16 16. UTI 12/06/22:  -U/A c/w UTI, ordered Macrobid 100mg   BID x5 days (12/06/22>>12/10/22 last dose), UCx pending- Echoli sensitive to macrobid  17.  Hypokalemia  - x2, recheck tomorrow  -K+ up to 3.6, losartan increased today, will recheck thursday  18. Dizziness -1/22 Will start meclizine 12.5mg  PRN, check orthostatic VS    LOS: 4 days A FACE TO FACE EVALUATION WAS PERFORMED  Fanny Dance 12/08/2022, 8:19 AM

## 2022-12-08 NOTE — Progress Notes (Signed)
Physical Therapy Session Note  Patient Details  Name: [name] MRN: [id number] Date of Birth: [birth year]  Today's Date: 12/08/2022 PT Individual Time: 0930-1000 PT Individual Time Calculation (min): 30 min   Short Term Goals: Week 1:  PT Short Term Goal 1 (Week 1): pt will be mod assist in slide board transfers PT Short Term Goal 2 (Week 1): pt will be mod assist with bed mobility PT Short Term Goal 3 (Week 1): pt will be maxA x1 w/ sit to stand trasnfer  Skilled Therapeutic Interventions/Progress Updates:    Pt seated in w/c on arrival and agreeable to therapy. Nsg present and administering pain medication at start of session. Pt reports 7/10 pain in R shoulder that worsens with mobility. R shoulder pain was biggest limiting factor this session, increased time spent problem solving for reduced pain and increased participation. Pt transported to therapy gym for time management and energy conservation. Pt participated in Sit to stand and pre-standing activity in //bars. Pt able to come to stand with mod-max A after several false starts. Pt did best when supporting R arm on therapist's arm or shoulder to maintain anterior postioning and avoid weightbearing. Pt unable to achieve full upright stand d/t pain and limited LE ROM. Pt also participated in pre-standing anterior weight shifts in bars to clear buttocks with good success and more manageable anxiety levels. Occasionally during session, pt reports feeling a falling sensation, which triggers some anxiety. Pt returned to room and remained in w/c, was left with all needs in reach and alarm active.   Therapy Documentation Precautions:  Precautions Precautions: Shoulder Type of Shoulder Precautions: R-shoulder pain with ROM/WB Precaution Comments: History of frequent falls at home. Restrictions Weight Bearing Restrictions: No General:       Therapy/Group: Individual Therapy  Mickel Fuchs 12/08/2022, 12:41 PM

## 2022-12-08 NOTE — Progress Notes (Signed)
Occupational Therapy Session Note  Patient Details  Name: [name] MRN: [id number] Date of Birth: [birth year]  Today's Date: 12/08/2022 OT Individual Time: 8299-3716 OT Individual Time Calculation (min): 65 min    Short Term Goals: Week 1:  OT Short Term Goal 1 (Week 1): Pt will complete STS transfer with Mod A + LRAD in preparation for standing ADL tasks. OT Short Term Goal 2 (Week 1): Pt will complete simple bathing activities with consistent Mod A + LRAD. OT Short Term Goal 3 (Week 1): Pt will complete LB dressing with Mod A + LRAD.  Skilled Therapeutic Interventions/Progress Updates:    Pt received supine with no c/o pain, agreeable to OT session. Lab present drawing blood. Pt came to EOB with mod instructional cueing and min A. She completed sit > stand in the stedy with mod +2 assist. Much improved standing initiation and independence today. She remained standing for 1 minute statically with min A. She was taken via stedy to the sink. Moving in the stedy gave her a LOT of anxiety and she required increased time and cueing for bringing her RR down and recentering herself mentally. She completed UB ADLs perched in the stedy- also gave a lot of anxiety with very poor proprioception. Mod A for ADLs. 5x sit <> stands in the stedy with mod +2 assist for total A LB bathing and dressing. Pt sat in the w/c and then stood x3 at the sink with mod +2 assist, very heavy L knee blocking and stabilization required. Pt also with likely L heel cord tightness as it is difficult to place LUE firmly on the ground. She then sat while OT assisted with washing hair in the sink- pt very excited about this. Performed for hygiene and improving self efficacy while discussing anxiety reducing techniques. She was left sitting up in the w/c with new elevating leg rests to improve fit on w/c. All needs within reach.    Therapy Documentation Precautions:  Precautions Precautions: Shoulder Type of Shoulder Precautions:  R-shoulder pain with ROM/WB Precaution Comments: History of frequent falls at home. Restrictions Weight Bearing Restrictions: No   Therapy/Group: Individual Therapy  Curtis Sites 12/08/2022, 6:25 AM

## 2022-12-09 DIAGNOSIS — K5900 Constipation, unspecified: Secondary | ICD-10-CM

## 2022-12-09 DIAGNOSIS — F411 Generalized anxiety disorder: Secondary | ICD-10-CM | POA: Diagnosis not present

## 2022-12-09 DIAGNOSIS — E1169 Type 2 diabetes mellitus with other specified complication: Secondary | ICD-10-CM

## 2022-12-09 DIAGNOSIS — E876 Hypokalemia: Secondary | ICD-10-CM | POA: Diagnosis not present

## 2022-12-09 DIAGNOSIS — Z794 Long term (current) use of insulin: Secondary | ICD-10-CM

## 2022-12-09 DIAGNOSIS — R5381 Other malaise: Secondary | ICD-10-CM | POA: Diagnosis not present

## 2022-12-09 LAB — GLUCOSE, CAPILLARY
Glucose-Capillary: 103 mg/dL — ABNORMAL HIGH (ref 70–99)
Glucose-Capillary: 233 mg/dL — ABNORMAL HIGH (ref 70–99)
Glucose-Capillary: 239 mg/dL — ABNORMAL HIGH (ref 70–99)
Glucose-Capillary: 189 mg/dL — ABNORMAL HIGH (ref 70–99)

## 2022-12-09 MED ORDER — AMMONIUM LACTATE 5 % EX LOTN
TOPICAL_LOTION | Freq: Two times a day (BID) | CUTANEOUS | Status: DC
  Filled 2022-12-09: qty 222

## 2022-12-09 MED ORDER — POLYETHYLENE GLYCOL 3350 17 G PO PACK
17.0000 g | PACK | Freq: Every day | ORAL | Status: DC
  Administered 2022-12-09: 17 g via ORAL
  Filled 2022-12-09 (×2): qty 1

## 2022-12-09 MED ORDER — INSULIN ASPART 100 UNIT/ML IJ SOLN
2.0000 [IU] | Freq: Three times a day (TID) | INTRAMUSCULAR | Status: DC
  Administered 2022-12-09 (×2): 2 [IU] via SUBCUTANEOUS

## 2022-12-09 NOTE — Progress Notes (Signed)
Patient ID: [name], female   DOB: Mar 09, 1951, 72 y.o.   MRN: [id number]  Met with pt and husband who got to come today due to 10 days from [place]. Both were very happy to see each other. Discussed with both team conference goals of min assist and no target discharge date yet due to need to work on her anxiety. Pt has the movement it is the anxiety which limits her she reports she has fallen so many time sit is hard for her. She reports doing better this afternoon and hopefully this will continue. She is aware she will need to be at a level husband can manage while daughter is at work. She knows this and hopefully with consistent therapists she will do better due to building rapport with them and trusting them. Husband agrees and will be here more often now that he can and observe her in therapies. Aware will set target discharge date next week in conference.

## 2022-12-09 NOTE — Progress Notes (Signed)
Physical Therapy Session Note  Patient Details  Name: [name] MRN: [id number] Date of Birth: 09/25/1951  Today's Date: 12/09/2022 PT Individual Time: 1307-1405 PT Individual Time Calculation (min): 58 min   Short Term Goals: Week 1:  PT Short Term Goal 1 (Week 1): pt will be mod assist in slide board transfers PT Short Term Goal 2 (Week 1): pt will be mod assist with bed mobility PT Short Term Goal 3 (Week 1): pt will be maxA x1 w/ sit to stand trasnfer  Skilled Therapeutic Interventions/Progress Updates: Pt presented in w/c agreeable to therapy. Pt states received pain meds earlier. Pain primarily in R shoulder with rest breaks and distraction provided as needed during session. Extensive discussion with pt regarding what can we do as a team to help with anxiety regarding mobility. Discussed with pt continuing standing trials and can alternate between [place]. Pt voiced understanding. Pt transported to rehab gym and was able to scoot anteriorly with modA to [place]. Pt was able to lean to L to allow slide board placement. Pt then performed Slide board transfer to R with modA however with significantly improved technique than previous day with this therapist. At [facility] pt able to scoot more anteriorly to allow mat to be elevated and Stedy placed in front of pt. PTA placed blanket between pt's legs and yoga block between pt's feet. Pt was able to stand with modA x 2 from elevated mat and maintain standing for x 15 sec. On second stand PTA used mirror feedback to allow pt to make postural changes and pt was able to maintain standing with good posture for ~40sec. Pt indicated some increase in pain in R shoulder but not in BLE. Pt maintained in perch and was able to transfer w/c although pt did start leaning to L with pt becoming fearful and PTA providing tactile cues to correct (pt was ultimately able to correct). Pt able to sit in w/c and once leg rests placed was able to scoot anteriorly in w/c with  modA initially then complete with CGA. Pt transported back to room and remained in w/c at end of session with call bell within reach and needs met.      Therapy Documentation Precautions:  Precautions Precautions: Shoulder Type of Shoulder Precautions: R-shoulder pain with ROM/WB Precaution Comments: History of frequent falls at home. Restrictions Weight Bearing Restrictions: No General:   Vital Signs: Therapy Vitals Temp: 98.2 F (36.8 C) Temp Source: Oral Pulse Rate: 78 Resp: 16 BP: 129/64 Patient Position (if appropriate): Sitting Oxygen Therapy SpO2: 97 % O2 Device: Room Air Pain:   Mobility:   Locomotion :    Trunk/Postural Assessment :    Balance:   Exercises:   Other Treatments:      Therapy/Group: Individual Therapy  Jiayi Lengacher 12/09/2022, 4:19 PM

## 2022-12-09 NOTE — Progress Notes (Signed)
Occupational Therapy Session Note  Patient Details  Name: [name] MRN: [id number] Date of Birth: April 18, 1951  Today's Date: 12/09/2022 OT Individual Time: 4656-8127 OT Individual Time Calculation (min): 56 min    Short Term Goals: Week 1:  OT Short Term Goal 1 (Week 1): Pt will complete STS transfer with Mod A + LRAD in preparation for standing ADL tasks. OT Short Term Goal 2 (Week 1): Pt will complete simple bathing activities with consistent Mod A + LRAD. OT Short Term Goal 3 (Week 1): Pt will complete LB dressing with Mod A + LRAD.  Skilled Therapeutic Interventions/Progress Updates:    Pt received supine with no c/o pain, agreeable to OT session. Pt came to EOB with min A to shift R hip forward. She came to standing in the stedy with min A to position the RLE and only min A to stand from an elevated EOB. She was able to stand from the perched stedy for bariatric BSC with min A. Max A for toileting tasks overall. Unable to void BM and (+) urine void. She stood with heavy mod A, +2 assist for stabilization and management of stedy flaps. Patient required increased time for initiation, cuing, rest breaks, and for completion of tasks throughout session. Utilized therapeutic use of self throughout to promote efficiency. She completed 3 more sit > stands d/t poor standing tolerance and requiring breaks as OT and tech worked incontinence brief up. She transferred to the w/c. Good improvement in anxiety this session, pt also confirming that d/t familiarity and rapport with this writer she is less anxious. She required total A to don pants. Pt stood using the end of the bed as support and knee blocking, with OT providing L knee adduction block with max A. She completed oral care at the sink with set up assist seated. Pt was left sitting up in the w/c with all needs met and call bell within reach.    Therapy Documentation Precautions:  Precautions Precautions: Shoulder Type of Shoulder Precautions:  R-shoulder pain with ROM/WB Precaution Comments: History of frequent falls at home. Restrictions Weight Bearing Restrictions: No  Therapy/Group: Individual Therapy  Curtis Sites 12/09/2022, 6:40 AM

## 2022-12-09 NOTE — Progress Notes (Signed)
Physical Therapy Session Note  Patient Details  Name: [name] MRN: [id number] Date of Birth: 11/11/1951  Today's Date: 12/09/2022 PT Individual Time: 0915-1030 PT Individual Time Calculation (min): 75 min   Short Term Goals: Week 1:  PT Short Term Goal 1 (Week 1): pt will be mod assist in slide board transfers PT Short Term Goal 2 (Week 1): pt will be mod assist with bed mobility PT Short Term Goal 3 (Week 1): pt will be maxA x1 w/ sit to stand trasnfer  Skilled Therapeutic Interventions/Progress Updates:      Pt sitting in w/c on arrival - agreeable to PT tx. Patient scraped her R shin in earlier session, mild skin tear. RN aware and NT donned dressing/gauze.   Pt transported in w/c to main rehab gym.   Focused session on sit<>stands. Patient HIGHLY anxious and VERY high levels of fear of falling, impacting mobility training. Extended seated rest breaks, calming, guided breathing throughout session. Discussed in brief Rx that can assist with anxiety and spoke with RN afterwards regarding scheduled vs PRN anxiety med.   Attempted to facilitate sit<>stands using Clarise Cruz Plus. Provided demonstration prior to improve understanding and reduce anxiety. Needed +2 assist to stand using the [facility]. 2nd assist for stabilizing her feet as her L foot stays adducted and her R foot stays extended. Able to stand for only brief periods, primarily self limited by fear of falling, anxiety, and R shoulder pain (chronic). Deferred further trials due to anxiety.  SB transfer with +2 modA from w/c to mat table. TotalA for board placement and max cues for calming.   Focused remainder of time on trying to simulate home environment to calm nerves - from elevated mat table (to simulate chair lift) and bariatric RW, worked on standing - pt unable to reach full upright with adequate hip/trunk extension. Pt continuing to express high levels of fear of falling despite safely setting up the environment to prevent  this.   Pt returned to her w/c in similar manner as above, continued fear of falling and high anxiety.  Concluded session in w/c in her room, call bell in reach, and all needs met.   Pt confirms home setup and PLOF to help with clarity. Pt reports she would walk with a walker, relies on a chair lift to stand and a very elevated    Therapy Documentation Precautions:  Precautions Precautions: Shoulder Type of Shoulder Precautions: R-shoulder pain with ROM/WB Precaution Comments: History of frequent falls at home. Restrictions Weight Bearing Restrictions: No General:     Therapy/Group: Individual Therapy  Alger Simons 12/09/2022, 7:52 AM

## 2022-12-09 NOTE — Patient Care Conference (Signed)
Inpatient RehabilitationTeam Conference and Plan of Care Update Date: 12/09/2022   Time: 12:19 PM    Patient Name: [name]      Medical Record Number: [id number]  Date of Birth: 08-29-1951 Sex: Female         Room/Bed: 4M08C/4M08C-01 Payor Info: Payor: [place] / Plan: [employer] MEDICARE / Product Type: *No Product type* /    Admit Date/Time:  12/04/2022  3:43 PM  Primary Diagnosis:  [facility] Problems: Principal Problem:   Debility Active Problems:   Constipation    Expected Discharge Date: Expected Discharge Date:  (TBD)  Team Members Present: Physician leading conference: Dr. Jennye Boroughs Social Worker Present: Ovidio Kin, LCSW Nurse Present: Dorien Chihuahua, RN PT Present: Terence Lux, PT OT Present: Laverle Hobby, OT PPS Coordinator present : Gunnar Fusi, SLP     Current Status/Progress Goal Weekly Team Focus  Bowel/Bladder   Patient incontinent of urine at time/ Using bed pan.Patient also has urgency,and difficulty starting a stream occasionally.   Patient will be free of incontinence episodes.   Bladder training/ assess patient for need to void.    Swallow/Nutrition/ Hydration               ADL's   mod A standing from the elevated EOB in the stedy, mod +2 to stand with the sink, needs heavy stabilization at the knees without the stedy, total A LB ADLs, min A LB. Severly limited by anxiety and R shoulder pain   min A   ADLs, anxiety reduction, transfers, standing balance, d/c planning    Mobility   MaxA bed mobility, mod-maxA x 2 slide board transfers, modA x 2 - maxA x 2 sit to stand. good static and dyanmic sitting balance but severly limited by fear/anxiety for all mobility   min assist w/ bed mobility and transfers, mod assist for gait at least 10-15 ft  Standing tolerance, pre-gait activities, transfers, d/c planning    Communication                Safety/Cognition/ Behavioral Observations                Pain   Patient occoasionally complaining of Right shoulder pain. Voltaren cream Scheduled, PRN Tramadol and oxy.   Patient to be able to perform ADL and therapy without pain.   Continue pain assessment and maintain pain at patient's Goal.    Skin   Patient has no pressure injury. Existing BLE discoloration.   Patient will remain free from pressure injury.  Provide skin care after incontinence episode. Encourage frequent turning and reposition      Discharge Planning:  HOme with husband who has some health issues-daughter lives in the basement and works out of the home M-F. Once husband can come in due to [place] will see if can provide physical care   Team Discussion: Patient admitted with debility; hx. of chronic vertigo. Right shoulder mobility limitations; voltaren gel for pain. Patient limited by fear/anxiety with sitting as well as standing.  Used lift chair and high toilet PTA.  Patient on target to meet rehab goals: Currently needs min assist for use of stedy, sitting EOB and standing using foot-railing of bed. Needs total assist for lower body care and min assist for upper body ADLs. Needs mod - max assist for slide-board trasfers and mod assist for sit- stand. Goals for discharge set for min-mod assist overall.  *See Care Plan and progress notes for long and short-term goals.  Revisions to Treatment Plan:  Psychology consult - anxiety   Teaching Needs: Safety, medications, transfers, toileting, skin care, etc.   Current Barriers to Discharge: Decreased caregiver support and Home enviroment access/layout  Possible Resolutions to Barriers: Family education     Medical Summary Current Status: debility, vertigo, anxiety, hypokalmeia, constipation DM2  Barriers to Discharge: Medical stability;Electrolyte abnormality;Self-care education  Barriers to Discharge Comments: debility, vertigo, anxiety, hypokalmeia, constipation DM2 Possible Resolutions to [employer] Focus:  adjust insulin, monitor lytes, xanax started prn, mirlax   Continued Need for Acute Rehabilitation Level of Care: The patient requires daily medical management by a physician with specialized training in physical medicine and rehabilitation for the following reasons: Direction of a multidisciplinary physical rehabilitation program to maximize functional independence : Yes Medical management of patient stability for increased activity during participation in an intensive rehabilitation regime.: Yes Analysis of laboratory values and/or radiology reports with any subsequent need for medication adjustment and/or medical intervention. : Yes   I attest that I was present, lead the team conference, and concur with the assessment and plan of the team.   Dorien Chihuahua B 12/09/2022, 2:14 PM

## 2022-12-09 NOTE — Progress Notes (Addendum)
PROGRESS NOTE   Subjective/Complaints: She has anxiety with therapy when standing, however she feels like this is getting better gradually.  Reports hx of chronic vertigo, has seen neurology in the past for this. Reports no BM in a few days but this is not unusual for her. She has not additional concerns.  ROS: +urinary frequency, +insomnia-improving. +dizziness, , Denies fevers, chills, CP, SOB, abd pain, N/V/D/C, dysuria, new/worsening numbness, tingling, focal weakness, or any other complaints at this time.  + anxiety- chronic, worse when she is standing  Objective:   No results found. Recent Labs    12/07/22 0554  WBC 4.7  HGB 14.4  HCT 46.5*  PLT 197    Recent Labs    12/07/22 0554 12/08/22 0744  NA 134* 136  K 3.0* 3.6  CL 96* 96*  CO2 32 28  GLUCOSE 181* 226*  BUN 12 9  CREATININE 0.71 0.67  CALCIUM 8.8* 9.0    Urinalysis    Component Value Date/Time   COLORURINE YELLOW 12/05/2022 1630   APPEARANCEUR CLOUDY (A) 12/05/2022 1630   LABSPEC 1.008 12/05/2022 1630   PHURINE 7.0 12/05/2022 1630   GLUCOSEU NEGATIVE 12/05/2022 1630   HGBUR MODERATE (A) 12/05/2022 1630   BILIRUBINUR NEGATIVE 12/05/2022 1630   KETONESUR NEGATIVE 12/05/2022 1630   PROTEINUR NEGATIVE 12/05/2022 1630   NITRITE NEGATIVE 12/05/2022 1630   LEUKOCYTESUR LARGE (A) 12/05/2022 1630  Urine Micro 12/05/22: Bacteria: few RBC: 21-50 Squamous: 0-5 WBC clumps: present WBC: >50  UCx 12/05/22: pending     Intake/Output Summary (Last 24 hours) at 12/09/2022 1025 Last data filed at 12/09/2022 0818 Gross per 24 hour  Intake 960 ml  Output 150 ml  Net 810 ml         Physical Exam: Vital Signs Blood pressure (!) 140/70, pulse 82, temperature 98 F (36.7 C), temperature source Oral, resp. rate 18, height 5\' 7"  (1.702 m), weight 94 kg, SpO2 95 %.  Physical Exam Constitution: Appropriate appearance for age. No apparent distress  +Obese, lying in bed HEENT: R pupil fixed, dilated, blind; L pupil reactive, EOM grossly intact.  Resp: CTAB. No rales, rhonchi, or wheezing. Cardio:IIR Abdomen: Nondistended. Nontender. +bowel sounds. Psych: Appropriate mood and affect. pleasant Skin: BLE with ichthyosis. R foot mepilex dressing with open wound, clean base, mild bloody drainage. Finger bilateral hand flushed appearing. Trace b/l  edema    Neurologic/MsK:   Sensory exam: revealed reduced sensation to light touch over right plantar foot and heel. . Motor exam:  Moving all 4 extremities   Prior Exam      Strength:                RUE: 4/5 SA, 5-/5 EF, 5-/5 EE, 5/5 WE, 5/5 FF, 5/5 FA                 LUE: 5/5 SA, 5/5 EF, 5/5 EE, 5/5 WE, 5/5 FF, 5/5 FA                 RLE: 3+/5 HF, 4+/5 KE, 4/5 DF,  5/5 PF                 LLE:  3-/5 HF, 3+/5 KE, 5/5 DF,  5/5 PF  Coordination: Fine motor coordination was normal.      Assessment/Plan: 1. Functional deficits which require 3+ hours per day of interdisciplinary therapy in a comprehensive inpatient rehab setting. Physiatrist is providing close team supervision and 24 hour management of active medical problems listed below. Physiatrist and rehab team continue to assess barriers to discharge/monitor patient progress toward functional and medical goals  Care Tool:  Bathing    Body parts bathed by patient: Right arm, Chest, Abdomen, Front perineal area, Right upper leg, Left upper leg, Face   Body parts bathed by helper: Left arm, Buttocks, Right lower leg, Left lower leg     Bathing assist Assist Level: Moderate Assistance - Patient 50 - 74%     Upper Body Dressing/Undressing Upper body dressing   What is the patient wearing?: Pull over shirt    Upper body assist Assist Level: Contact Guard/Touching assist    Lower Body Dressing/Undressing Lower body dressing      What is the patient wearing?: Incontinence brief, Pants     Lower body assist Assist for lower  body dressing: Total Assistance - Patient < 25%     Toileting Toileting    Toileting assist Assist for toileting: Total Assistance - Patient < 25%     Transfers Chair/bed transfer  Transfers assist     Chair/bed transfer assist level: 2 Helpers     Locomotion Ambulation   Ambulation assist   Ambulation activity did not occur: Safety/medical concerns          Walk 10 feet activity   Assist  Walk 10 feet activity did not occur: Safety/medical concerns        Walk 50 feet activity   Assist Walk 50 feet with 2 turns activity did not occur: Safety/medical concerns         Walk 150 feet activity   Assist Walk 150 feet activity did not occur: Safety/medical concerns         Walk 10 feet on uneven surface  activity   Assist Walk 10 feet on uneven surfaces activity did not occur: Safety/medical concerns         Wheelchair     Assist Is the patient using a wheelchair?: Yes Type of Wheelchair: Manual    Wheelchair assist level: Dependent - Patient 0% Max wheelchair distance: 150    Wheelchair 50 feet with 2 turns activity    Assist        Assist Level: Dependent - Patient 0%   Wheelchair 150 feet activity     Assist      Assist Level: Dependent - Patient 0%   Blood pressure (!) 140/70, pulse 82, temperature 98 F (36.7 C), temperature source Oral, resp. rate 18, height 5\' 7"  (1.702 m), weight 94 kg, SpO2 95 %.  Medical Problem List and Plan: 1. Functional deficits secondary to debility due to acute on chronic HFpEF, atrial fibrillation with RVR             -patient may shower             -ELOS/Goals: 10-14 days, ST, PT, OT goals   Team conference today please see physician documentation under team conference tab, met with team  to discuss problems,progress, and goals. Formulized individual treatment plan based on medical history, underlying problem and comorbidities.   -Consider psych consult for anxiety  2.   Antithrombotics: -DVT/anticoagulation:  Lovenox 40 mg daily (No full-dose AC d/t Hx  [place] 2007)             -antiplatelet therapy: ASA 325 daily per IM until OP cards f/u 3. Pain Management:  Oxycodone or tramadol prn.  -encourage use of Voltaren gel 2g QID for OA right shoulder/right knee.   4. Mood/Behavior/Sleep: LCSW to follow for evaluation and support.              -Melatonin prn--12/05/22 ordered as scheduled 5mg  QHS, Trazodone PRN   -12/06/22 sleeping improving, continue current regimen             -antipsychotic agents: N/A  -Nortriptyline 10mg  QHS, Neurontin 100mg  QHS 5. Neuropsych/cognition: This patient is capable of making decisions on her own behalf.  -start xanax 0.25 prn for anxiety  -Consider SSRI 6. Skin/Wound Care: Add lotion to BLE and hands             -routine pressure relief measures.  7. Fluids/Electrolytes/Nutrition: Monitor I/O. Intake improving. Monitor CMP 12/07/22 and weekly BMPs starting 12/14/22 -May need to stay dry to avoid fluid overload. 8. Acute on chronic diastolic CHF: Strict I/O w/daily weights. -Cardiac diet. Monitor for signs of overload.  -continue Lasix 40mg  QD, Aldactone 12.5mg  QD, metoprolol 100mg  QD, pravastatin 40mg  QD and Losartan 25mg  QD -monitor for recurrent hyperkalemia (Losartan dose decreased 01/14) -1/23 weights appear stable Filed Weights   12/07/22 0430 12/08/22 0331 12/09/22 0500  Weight: 92.8 kg 94.8 kg 94 kg     9. A Fib w/RVR: Monitor HR TID--continue Cardizem 180mg  QD and Metoprolol 100mg  QD 10. HTN: BPs with some lability, 130s-160s SBP -12/05/22 monitor for now, Losartan decreased 11/29/22, could consider going back to home dosing if appropriate -1/23 BP has been elevated, will increase losartan to 50mg   Vitals:   12/05/22 1729 12/05/22 1940 12/06/22 0304 12/06/22 1531  BP: (!) 161/65 127/81 134/70 (!) 116/59   12/06/22 1948 12/07/22 0209 12/07/22 1701 12/07/22 1922  BP: 114/86 (!) 161/76 (!) 146/70 (!) 148/65   12/08/22 0324  12/08/22 1454 12/08/22 1957 12/09/22 0317  BP: (!) 156/70 117/61 120/64 (!) 140/70     11.  T1DM: Hgb A1c- 7.4. Monitor BS ac/hs and use SSI for elevated BS             -continue insulin glargline 15 units daily.  -May need to add meal coverage-->used 5- 7units tid prn PTA.  -12/05/22 CBGs 170s-200s, will increase Semglee to 16U QD, monitor -12/06/22 CBGs variable but this morning 108; continue to monitor on this regimen, consider adding short acting daytime/mealtime coverage if trend continues -1/24 add 2 units mealtime insulin CBG (last 3)  Recent Labs    12/08/22 1628 12/08/22 2142 12/09/22 0607  GLUCAP 250* 182* 103*      12. Chronic bilateral shoulder and left knee pain: Oxycodone prn -Refusing voltaren gel for local measures-->encourage use             -Aquathermia for local measures.              -L knee: Follows OP ortho, no benefit from injections in the past 13. Upper respiratory infection, Nocturnal hypoxia: Question sleep apnea.  -Encourage pulmonary hygiene -Respiratory viral panel negative x2 inpatient -Remains with cough, intermittent SOB - add PRN inhaler, monitor 14.  Pre-renal azotemia: Likely due to diuresis. BUN up but SCr improving.   -Continue to monitor.  -1/22 BUN and CR WNL 15. Overactive bladder, incontinence: hx of, on Myrbetriq 50mg  QD; now off purewick -12/05/22 having increased frequency and incontinence, ordered  U/A, UCx, and scheduled voiding program -12/06/22 U/A c/w UTI, see #16 -Continue myrbetriq,she is mostly continent with occasional incontinent episodes 16. UTI 12/06/22:  -U/A c/w UTI, ordered Macrobid 100mg  BID x5 days (12/06/22>>12/10/22 last dose), UCx pending- Echoli sensitive to macrobid  17.  Hypokalemia  -12/12/22 x2, recheck tomorrow  -K+ up to 3.6, losartan increased today,  Recheck tomorrow  18. Dizziness -1/22 Will start meclizine 12.5mg  PRN, check orthostatic VS  -She has HX of vertigo going back to 2013, seen by Dr. 2014  Neurology, felt to be related to vestibulopathy  19. Constipation  -Schedule miralax    LOS: 5 days A FACE TO FACE EVALUATION WAS PERFORMED  Modesto Charon 12/09/2022, 10:25 AM

## 2022-12-10 DIAGNOSIS — E875 Hyperkalemia: Secondary | ICD-10-CM

## 2022-12-10 LAB — GLUCOSE, CAPILLARY
Glucose-Capillary: 81 mg/dL (ref 70–99)
Glucose-Capillary: 188 mg/dL — ABNORMAL HIGH (ref 70–99)
Glucose-Capillary: 125 mg/dL — ABNORMAL HIGH (ref 70–99)
Glucose-Capillary: 44 mg/dL — CL (ref 70–99)
Glucose-Capillary: 121 mg/dL — ABNORMAL HIGH (ref 70–99)

## 2022-12-10 LAB — BASIC METABOLIC PANEL
Anion gap: 7 (ref 5–15)
BUN: 7 mg/dL — ABNORMAL LOW (ref 8–23)
CO2: 32 mmol/L (ref 22–32)
Calcium: 9.4 mg/dL (ref 8.9–10.3)
Chloride: 100 mmol/L (ref 98–111)
Creatinine, Ser: 0.65 mg/dL (ref 0.44–1.00)
GFR, Estimated: >60 mL/min (ref >60)
Glucose, Bld: 97 mg/dL (ref 70–99)
Potassium: 5.4 mmol/L — ABNORMAL HIGH (ref 3.5–5.1)
Sodium: 139 mmol/L (ref 135–145)

## 2022-12-10 MED ORDER — INSULIN ASPART 100 UNIT/ML IJ SOLN
4.0000 [IU] | Freq: Three times a day (TID) | INTRAMUSCULAR | Status: DC
  Administered 2022-12-10 – 2022-12-11 (×3): 4 [IU] via SUBCUTANEOUS

## 2022-12-10 MED ORDER — POLYETHYLENE GLYCOL 3350 17 G PO PACK
17.0000 g | PACK | Freq: Two times a day (BID) | ORAL | Status: DC
  Administered 2022-12-10 – 2022-12-11 (×2): 17 g via ORAL
  Filled 2022-12-10 (×2): qty 1

## 2022-12-10 MED ORDER — SODIUM ZIRCONIUM CYCLOSILICATE 5 G PO PACK
5.0000 g | PACK | Freq: Once | ORAL | Status: AC
  Administered 2022-12-10: 5 g via ORAL
  Filled 2022-12-10: qty 1

## 2022-12-10 MED ORDER — SORBITOL 70 % SOLN
60.0000 mL | Freq: Once | Status: AC
  Administered 2022-12-10: 60 mL via ORAL
  Filled 2022-12-10: qty 60

## 2022-12-10 MED ORDER — INSULIN GLARGINE-YFGN 100 UNIT/ML ~~LOC~~ SOLN
14.0000 [IU] | Freq: Every day | SUBCUTANEOUS | Status: DC
  Filled 2022-12-10 (×2): qty 0.14

## 2022-12-10 NOTE — Plan of Care (Signed)
  Problem: Consults Goal: RH GENERAL PATIENT EDUCATION Description: See Patient Education module for education specifics. Outcome: Progressing   Problem: RH BOWEL ELIMINATION Goal: RH STG MANAGE BOWEL WITH ASSISTANCE Description: STG Manage Bowel with mod I Assistance. Outcome: Progressing Goal: RH STG MANAGE BOWEL W/MEDICATION W/ASSISTANCE Description: STG Manage Bowel with Medication with mod I Assistance. Outcome: Progressing   Problem: RH BLADDER ELIMINATION Goal: RH STG MANAGE BLADDER WITH ASSISTANCE Description: STG Manage Bladder With mod I Assistance Outcome: Progressing Goal: RH STG MANAGE BLADDER WITH MEDICATION WITH ASSISTANCE Description: STG Manage Bladder With Medication With mod I Assistance. Outcome: Progressing   Problem: RH SAFETY Goal: RH STG ADHERE TO SAFETY PRECAUTIONS W/ASSISTANCE/DEVICE Description: STG Adhere to Safety Precautions With cues Assistance/Device. Outcome: Progressing   Problem: RH PAIN MANAGEMENT Goal: RH STG PAIN MANAGED AT OR BELOW PT'S PAIN GOAL Description: < 4 with prns Outcome: Progressing   Problem: RH KNOWLEDGE DEFICIT GENERAL Goal: RH STG INCREASE KNOWLEDGE OF SELF CARE AFTER HOSPITALIZATION Description: Patient and family will be able to manage care at discharge using educational handouts/resources independently Outcome: Progressing   Problem: Consults Goal: RH GENERAL PATIENT EDUCATION Description: See Patient Education module for education specifics. Outcome: Progressing   Problem: RH BOWEL ELIMINATION Goal: RH STG MANAGE BOWEL WITH ASSISTANCE Description: STG Manage Bowel with mod I Assistance. Outcome: Progressing Goal: RH STG MANAGE BOWEL W/MEDICATION W/ASSISTANCE Description: STG Manage Bowel with Medication with mod I Assistance. Outcome: Progressing   Problem: RH BLADDER ELIMINATION Goal: RH STG MANAGE BLADDER WITH ASSISTANCE Description: STG Manage Bladder With mod I Assistance Outcome: Progressing Goal: RH  STG MANAGE BLADDER WITH MEDICATION WITH ASSISTANCE Description: STG Manage Bladder With Medication With mod I Assistance. Outcome: Progressing   Problem: RH SAFETY Goal: RH STG ADHERE TO SAFETY PRECAUTIONS W/ASSISTANCE/DEVICE Description: STG Adhere to Safety Precautions With cues Assistance/Device. Outcome: Progressing   Problem: RH PAIN MANAGEMENT Goal: RH STG PAIN MANAGED AT OR BELOW PT'S PAIN GOAL Description: < 4 with prns Outcome: Progressing   Problem: RH KNOWLEDGE DEFICIT GENERAL Goal: RH STG INCREASE KNOWLEDGE OF SELF CARE AFTER HOSPITALIZATION Description: Patient and family will be able to manage care at discharge using educational handouts/resources independently Outcome: Progressing

## 2022-12-10 NOTE — Progress Notes (Signed)
PROGRESS NOTE   Subjective/Complaints: Seen at bedside. Reports chronic R shoulder pain. She says she is followed by ortho, voltaren helping her pain.  She continues to have anxiety with therapy.   ROS: +urinary frequency, +insomnia-improving. +dizziness, , Denies fevers, chills, CP, SOB, abd pain, N/V/D/C, dysuria, new/worsening numbness, tingling, focal weakness + anxiety- chronic, worse when she is standing + chronic R shoulder pain  Objective:   No results found. No results for input(s): "WBC", "HGB", "HCT", "PLT" in the last 72 hours.  Recent Labs    12/08/22 0744 12/10/22 0529  NA 136 139  K 3.6 5.4*  CL 96* 100  CO2 28 32  GLUCOSE 226* 97  BUN 9 7*  CREATININE 0.67 0.65  CALCIUM 9.0 9.4    Urinalysis    Component Value Date/Time   COLORURINE YELLOW 12/05/2022 1630   APPEARANCEUR CLOUDY (A) 12/05/2022 1630   LABSPEC 1.008 12/05/2022 1630   PHURINE 7.0 12/05/2022 1630   GLUCOSEU NEGATIVE 12/05/2022 1630   HGBUR MODERATE (A) 12/05/2022 1630   BILIRUBINUR NEGATIVE 12/05/2022 1630   KETONESUR NEGATIVE 12/05/2022 1630   PROTEINUR NEGATIVE 12/05/2022 1630   NITRITE NEGATIVE 12/05/2022 1630   LEUKOCYTESUR LARGE (A) 12/05/2022 1630  Urine Micro 12/05/22: Bacteria: few RBC: 21-50 Squamous: 0-5 WBC clumps: present WBC: >50  UCx 12/05/22: pending    Shoulder Xray 11/30/22 Postsurgical changes are noted in the proximal right humerus with deformity identified. Underlying bony thorax appears within normal limits. No fracture or dislocation is seen. Stable bone island is noted in the mid humerus. No new focal abnormality is seen.   IMPRESSION: Postsurgical  changes without acute abnormality.     Intake/Output Summary (Last 24 hours) at 12/10/2022 0821 Last data filed at 12/10/2022 0740 Gross per 24 hour  Intake 1260 ml  Output 2150 ml  Net -890 ml         Physical Exam: Vital Signs Blood pressure  136/82, pulse 73, temperature 97.9 F (36.6 C), temperature source Oral, resp. rate 16, height 5\' 7"  (1.702 m), weight 89.9 kg, SpO2 93 %.  Physical Exam Constitution: Appropriate appearance for age. No apparent distress +Obese, lying in bed HEENT: R pupil fixed, dilated, blind; L pupil reactive, EOM grossly intact.  Resp: CTAB. No rales, rhonchi, or wheezing. Cardio:IIR Abdomen: Nondistended. Nontender. +bowel sounds. Psych: Appropriate mood and affect. pleasant Skin: BLE with ichthyosis. R foot mepilex dressing with open wound, clean base, mild bloody drainage. Finger bilateral hand flushed appearing. Trace b/l  edema    Neurologic/MsK:   Sensory exam: revealed reduced sensation to light touch over right plantar foot and heel. . Motor exam:  Moving all 4 extremities R shoulder tenderness with palpation and Rom in all directions  Prior Exam      Strength:                RUE: 4/5 SA, 5-/5 EF, 5-/5 EE, 5/5 WE, 5/5 FF, 5/5 FA                 LUE: 5/5 SA, 5/5 EF, 5/5 EE, 5/5 WE, 5/5 FF, 5/5 FA  RLE: 3+/5 HF, 4+/5 KE, 4/5 DF,  5/5 PF                 LLE:  3-/5 HF, 3+/5 KE, 5/5 DF,  5/5 PF  Coordination: Fine motor coordination was normal.      Assessment/Plan: 1. Functional deficits which require 3+ hours per day of interdisciplinary therapy in a comprehensive inpatient rehab setting. Physiatrist is providing close team supervision and 24 hour management of active medical problems listed below. Physiatrist and rehab team continue to assess barriers to discharge/monitor patient progress toward functional and medical goals  Care Tool:  Bathing    Body parts bathed by patient: Right arm, Chest, Abdomen, Front perineal area, Right upper leg, Left upper leg, Face   Body parts bathed by helper: Left arm, Buttocks, Right lower leg, Left lower leg     Bathing assist Assist Level: Moderate Assistance - Patient 50 - 74%     Upper Body Dressing/Undressing Upper body  dressing   What is the patient wearing?: Pull over shirt    Upper body assist Assist Level: Contact Guard/Touching assist    Lower Body Dressing/Undressing Lower body dressing      What is the patient wearing?: Incontinence brief, Pants     Lower body assist Assist for lower body dressing: Total Assistance - Patient < 25%     Toileting Toileting    Toileting assist Assist for toileting: Total Assistance - Patient < 25%     Transfers Chair/bed transfer  Transfers assist     Chair/bed transfer assist level: 2 Helpers     Locomotion Ambulation   Ambulation assist   Ambulation activity did not occur: Safety/medical concerns          Walk 10 feet activity   Assist  Walk 10 feet activity did not occur: Safety/medical concerns        Walk 50 feet activity   Assist Walk 50 feet with 2 turns activity did not occur: Safety/medical concerns         Walk 150 feet activity   Assist Walk 150 feet activity did not occur: Safety/medical concerns         Walk 10 feet on uneven surface  activity   Assist Walk 10 feet on uneven surfaces activity did not occur: Safety/medical concerns         Wheelchair     Assist Is the patient using a wheelchair?: Yes Type of Wheelchair: Manual    Wheelchair assist level: Dependent - Patient 0% Max wheelchair distance: 150    Wheelchair 50 feet with 2 turns activity    Assist        Assist Level: Dependent - Patient 0%   Wheelchair 150 feet activity     Assist      Assist Level: Dependent - Patient 0%   Blood pressure 136/82, pulse 73, temperature 97.9 F (36.6 C), temperature source Oral, resp. rate 16, height 5\' 7"  (1.702 m), weight 89.9 kg, SpO2 93 %.  Medical Problem List and Plan: 1. Functional deficits secondary to debility due to acute on chronic HFpEF, atrial fibrillation with RVR             -patient may shower             -ELOS/Goals: 10-14 days, Min A/Mod  A-gait  -Continue CIR   2.  Antithrombotics: -DVT/anticoagulation:  Lovenox 40 mg daily (No full-dose AC d/t Hx SAH 2007)             -  antiplatelet therapy: ASA 325 daily per IM until OP cards f/u 3. Pain Management:  Oxycodone or tramadol prn.  -encourage use of Voltaren gel 2g QID for OA right shoulder/right knee.   4. Mood/Behavior/Sleep: LCSW to follow for evaluation and support.              -Melatonin prn--12/05/22 ordered as scheduled 5mg  QHS, Trazodone PRN   -12/06/22 sleeping improving, continue current regimen             -antipsychotic agents: N/A  -Nortriptyline 10mg  QHS, Neurontin 100mg  QHS 5. Neuropsych/cognition: This patient is capable of making decisions on her own behalf.  -start xanax 0.25 prn for anxiety  -Consulted Psychiatry- appreciate assistance 6. Skin/Wound Care: Add lotion to BLE and hands             -routine pressure relief measures.  7. Fluids/Electrolytes/Nutrition: Monitor I/O. Intake improving. Monitor CMP 12/07/22 and weekly BMPs starting 12/14/22 -May need to stay dry to avoid fluid overload. 8. Acute on chronic diastolic CHF: Strict I/O w/daily weights. -Cardiac diet. Monitor for signs of overload.  -continue Lasix 40mg  QD, Aldactone 12.5mg  QD, metoprolol 100mg  QD, pravastatin 40mg  QD and Losartan 25mg  QD -monitor for recurrent hyperkalemia (Losartan dose decreased 01/14) -1/25 weights stable Filed Weights   12/08/22 0331 12/09/22 0500 12/10/22 0500  Weight: 94.8 kg 94 kg 89.9 kg     9. A Fib w/RVR: Monitor HR TID--continue Cardizem 180mg  QD and Metoprolol 100mg  QD 10. HTN: BPs with some lability, 130s-160s SBP -12/05/22 monitor for now, Losartan decreased 11/29/22, could consider going back to home dosing if appropriate -1/23 BP has been elevated, will increase losartan to 50mg    Intermittently elevated, monitor tend Vitals:   12/06/22 1948 12/07/22 0209 12/07/22 1701 12/07/22 1922  BP: 114/86 (!) 161/76 (!) 146/70 (!) 148/65   12/08/22 0324  12/08/22 1454 12/08/22 1957 12/09/22 0317  BP: (!) 156/70 117/61 120/64 (!) 140/70   12/09/22 1308 12/09/22 1947 12/10/22 0356 12/10/22 0818  BP: 129/64 (!) 151/77 (!) 160/75 136/82     11.  T1DM: Hgb A1c- 7.4. Monitor BS ac/hs and use SSI for elevated BS             -continue insulin glargline 15 units daily.  -May need to add meal coverage-->used 5- 7units tid prn PTA.  -12/05/22 CBGs 170s-200s, will increase Semglee to 16U QD, monitor -12/06/22 CBGs variable but this morning 108; continue to monitor on this regimen, consider adding short acting daytime/mealtime coverage if trend continues -1/24 add 2 units mealtime insulin -1/25 increase mealtime to 4 units insulin, decrease lantus to 14 units, glucose has been higher in the day and lower in the AMs CBG (last 3)  Recent Labs    12/09/22 1637 12/09/22 2029 12/10/22 0627  GLUCAP 239* 189* 81      12. Chronic bilateral shoulder and left knee pain: Oxycodone prn -Refusing voltaren gel for local measures-->encourage use             -Aquathermia for local measures.              -L knee: Follows OP ortho, no benefit from injections in the past 13. Upper respiratory infection, Nocturnal hypoxia: Question sleep apnea.  -Encourage pulmonary hygiene -Respiratory viral panel negative x2 inpatient -Remains with cough, intermittent SOB - add PRN inhaler, monitor  14.  Pre-renal azotemia: Likely due to diuresis. BUN up but SCr improving.   -Continue to monitor.  -1/22 BUN and CR WNL  15. Overactive  bladder, incontinence: hx of, on Myrbetriq 50mg  QD; now off purewick -12/05/22 having increased frequency and incontinence, ordered U/A, UCx, and scheduled voiding program -12/06/22 U/A c/w UTI, see #16 -Continue myrbetriq,she is mostly continent with occasional incontinent episodes  16. UTI 12/06/22:  -U/A c/w UTI, ordered Macrobid 100mg  BID x5 days (12/06/22>>12/10/22 last dose), UCx pending- Echoli sensitive to macrobid  17.   Hypokalemia  -1meq x2, recheck tomorrow  -K+ up to 3.6, losartan increased today,  Recheck tomorrow  -1/25 K+ now elevated hyperkalemia at 5.4, lokelma 5g  x1, stop potassium supplementation, discussed with pharmacy, recheck tomorrow  18. Dizziness -1/22 Will start meclizine 12.5mg  PRN, check orthostatic VS  -She has HX of vertigo going back to 2013, seen by Dr. Jacelyn Grip [facility], felt to be related to vestibulopathy  19. Constipation  -Schedule miralax -Increase miralax to  BID  20. R shoulder pain  -Xray on 1/15 with postsurgical changes, continue voltaren gel    LOS: 6 days A FACE TO FACE EVALUATION WAS PERFORMED  Jennye Boroughs 12/10/2022, 8:21 AM

## 2022-12-10 NOTE — Progress Notes (Signed)
Occupational Therapy Session Note  Patient Details  Name: [name] MRN: [id number] Date of Birth: 12-16-1950  Today's Date: 12/10/2022 OT Individual Time: 9563-8756 OT Individual Time Calculation (min): 72 min    Short Term Goals: Week 1:  OT Short Term Goal 1 (Week 1): Pt will complete STS transfer with Mod A + LRAD in preparation for standing ADL tasks. OT Short Term Goal 2 (Week 1): Pt will complete simple bathing activities with consistent Mod A + LRAD. OT Short Term Goal 3 (Week 1): Pt will complete LB dressing with Mod A + LRAD.  Skilled Therapeutic Interventions/Progress Updates:    Pt received sitting in the w/c with 6/10 pain in her  R flank and shoulder, reporting she is premedicated and agreeable to OT session.  Pt taken via w/c to the therapy gym. She stood from the w/c in the stedy with pillow blocking knees and yoga block preventing feet from adducting in. She stood with heavy mod A of one person. From perched seat she completed 4x sit > stand with CGA- min A ! She worked on [place] activation in standing, extending and flexing hips with UE support on the bar. Activity working on strengthening posterior chain for ADL transfers. She was then transferred to the mat via stedy. She attempted to stand from EOM x4 with +2 support. Pt has great initiation from the mat and is able to get her hips almost fully lifted with only min A. Heavier max A to bring her hips forward to full stand. Eventually used sheet around her hips to facilitate forward pelvis translation and this was very effective with 3 people assist. She then used the stedy to stand from only slightly elevated mat but was unable to stand with several attempts. The mat was elevated and pt was able to stand with mod A. Pt returned to her w/c and to her room. She was left sitting up with all needs met, waiting for nursing staff to assist with toileting needs.   Therapy Documentation Precautions:  Precautions Precautions:  Shoulder Type of Shoulder Precautions: R-shoulder pain with ROM/WB Precaution Comments: History of frequent falls at home. Restrictions Weight Bearing Restrictions: No  Therapy/Group: Individual Therapy  Curtis Sites 12/10/2022, 7:40 AM

## 2022-12-10 NOTE — Progress Notes (Signed)
Occupational Therapy Note  Patient Details  Name: [name] MRN: [id number] Date of Birth: 1951-05-01  Today's Date: 12/10/2022 OT Missed Time: 51 Minutes Missed Time Reason: Patient ill (comment);Other (comment) (emesis episode)  Pt missed 60 mins of group session as pt experiencing emesis episode. Will make up missed minutes as time allows.    Corinne Ports [facility] 12/10/2022, 4:01 PM
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
## 2022-12-10 NOTE — Progress Notes (Signed)
Physical Therapy Session Note  Patient Details  Name: [name] MRN: [id number] Date of Birth: Jul 16, 1951  Today's Date: 12/10/2022 PT Individual Time: 0920-1005 PT Individual Time Calculation (min): 45 min   Short Term Goals: Week 1:  PT Short Term Goal 1 (Week 1): pt will be mod assist in slide board transfers PT Short Term Goal 2 (Week 1): pt will be mod assist with bed mobility PT Short Term Goal 3 (Week 1): pt will be maxA x1 w/ sit to stand trasnfer  Skilled Therapeutic Interventions/Progress Updates: Pt presented in bed agreeable to therapy. Pt states had to take pain meds early in am due to difficulty sleeping.  Pt expressing need to use toilet. Performed bed mobility with modA and heavy use of bed features. Pt required modA to scoot to EOB. Pt was elevated and Stedy placed. With bed significantly elevated and increased time pt was able to stand with modA on second attempt. Pt transferred to [facility] and required modA to perform controlled lowering to [facility]. Pt with continent urinary void. Once completed pt attempted to stand to [facility] but unable to perform with +1 assist after x 3 attempts. To conserve energy obtained +2 assistance and pt was able to stand with heavy modA +2> Pt was able to tolerate stand only to complete peri care and then pt was able to stand from perch with minA x 2 to complete clothing management. Pt then transferred to w/c and was able to sit in w/c with modA for controlled descent. Pt then transferred to sink and completed oral hygiene with set up assist. Pt left in w/c at end of session with call bell within reach and needs met.     Tx2: Pt missed 30 min skilled PT due to nausea/sickness. Upon PTA entering room pt and nsg notified PTA that pt with recent rounds of emesis. Spoke briefly with pt with pt looking pale. Pt stating feeling very fatigued and "off" after emesis. PTA agreeable to check later if pt feeling a bit better. Will continue efforts as schedule permits.       Therapy Documentation Precautions:  Precautions Precautions: Shoulder Type of Shoulder Precautions: R-shoulder pain with ROM/WB Precaution Comments: History of frequent falls at home. Restrictions Weight Bearing Restrictions: No General: PT Amount of Missed Time (min): 30 Minutes PT Missed Treatment Reason: Patient ill (Comment) Vital Signs: Therapy Vitals Temp: 98.1 F (36.7 C) Temp Source: Oral Pulse Rate: 73 Resp: 16 BP: 125/72 Patient Position (if appropriate): Sitting Oxygen Therapy SpO2: 92 % O2 Device: Room Air Pain:   Mobility:   Locomotion :    Trunk/Postural Assessment :    Balance:   Exercises:   Other Treatments:      Therapy/Group: Individual Therapy  Vira Chaplin 12/10/2022, 2:08 PM

## 2022-12-10 NOTE — Progress Notes (Signed)
Vomiting episode of soup. PRN medication given per Md order. VS WNL. Patient stable. PA aware   Yehuda Mao, LPN

## 2022-12-10 NOTE — Progress Notes (Signed)
Recreational Therapy Session Note  Patient Details  Name: [name] MRN: [id number] Date of Birth: June 03, 1951 Today's Date: 12/10/2022  Pt declined participation in scheduled 60 minute stress management/coping group due to c/o nausea/vomiting.  Nino Amano 12/10/2022, 3:58 PM

## 2022-12-10 NOTE — Consult Note (Signed)
  [name] is a 72 year old female with history of SAH w/vertigo (vestibulopathy), spinal tumor s/p RSXN w/right foot drop, vertigo T1DM, HTN who was admitted on 11/26/2022 with 3-4 weeks of progressive SOB leading to  orthopnea and tachycardia. She was Covid negative,  found to have acute on chronic diastolic CHF as well as A fib with flutter. CTA negative for PE and showed large right pleural effusion. She was started on IV diuresis, IV diltiazem and loaded with digoxin. Right pleural effusion tapped for 1.3 L by radiology on 01/12 but continues to have issues with hypoxia with drop in saturations to mid 80's at nights. BIPAP ordered and question compliance.  BLE dopplers were negative for DVT. 2 D echo showed EF 65-70% with mild reduction in RV function and trivial mitral/aortic regurgitation. Metoprolol added and titrated up for rate control.    Psychiatry consult placed for anxiety.  She has a severe anxiety will work with PT/OT that has been there has been preventing her progress.  Chart review shows primary team has started patient on alprazolam 0.25 and gabapentin 100 mg p.o. nightly.  Further chart review shows patient with multiple cardiac conditions that can contribute to increased anxiety with standing and positioning.  Patient will benefit from brief psychotherapy with neuropsychologist in order to help manage symptoms associated with cardiac condition.  Unable to provide any additional recommendations at this time.  Primary team has been communicated.  Psychiatric consult service to sign off at this time.

## 2022-12-11 DIAGNOSIS — E109 Type 1 diabetes mellitus without complications: Secondary | ICD-10-CM

## 2022-12-11 LAB — GLUCOSE, CAPILLARY
Glucose-Capillary: 169 mg/dL — ABNORMAL HIGH (ref 70–99)
Glucose-Capillary: 223 mg/dL — ABNORMAL HIGH (ref 70–99)
Glucose-Capillary: 215 mg/dL — ABNORMAL HIGH (ref 70–99)
Glucose-Capillary: 198 mg/dL — ABNORMAL HIGH (ref 70–99)

## 2022-12-11 LAB — BASIC METABOLIC PANEL
Anion gap: 9 (ref 5–15)
BUN: 7 mg/dL — ABNORMAL LOW (ref 8–23)
CO2: 29 mmol/L (ref 22–32)
Calcium: 9.6 mg/dL (ref 8.9–10.3)
Chloride: 101 mmol/L (ref 98–111)
Creatinine, Ser: 0.67 mg/dL (ref 0.44–1.00)
GFR, Estimated: >60 mL/min (ref >60)
Glucose, Bld: 169 mg/dL — ABNORMAL HIGH (ref 70–99)
Potassium: 4.3 mmol/L (ref 3.5–5.1)
Sodium: 139 mmol/L (ref 135–145)

## 2022-12-11 MED ORDER — POLYETHYLENE GLYCOL 3350 17 G PO PACK
17.0000 g | PACK | Freq: Every day | ORAL | Status: DC
  Administered 2022-12-12 – 2022-12-14 (×3): 17 g via ORAL
  Filled 2022-12-11 (×3): qty 1

## 2022-12-11 MED ORDER — INSULIN ASPART 100 UNIT/ML IJ SOLN
3.0000 [IU] | Freq: Three times a day (TID) | INTRAMUSCULAR | Status: DC
  Administered 2022-12-11 – 2022-12-16 (×14): 3 [IU] via SUBCUTANEOUS

## 2022-12-11 MED ORDER — INSULIN GLARGINE-YFGN 100 UNIT/ML ~~LOC~~ SOLN
14.0000 [IU] | Freq: Every day | SUBCUTANEOUS | Status: DC
  Administered 2022-12-11 – 2022-12-16 (×6): 14 [IU] via SUBCUTANEOUS
  Filled 2022-12-11 (×7): qty 0.14

## 2022-12-11 MED ORDER — INSULIN ASPART 100 UNIT/ML IJ SOLN: 3.0000 [IU] | Freq: Three times a day (TID) | INTRAMUSCULAR | Status: DC

## 2022-12-11 MED ORDER — INSULIN ASPART 100 UNIT/ML IJ SOLN
0.0000 [IU] | Freq: Three times a day (TID) | INTRAMUSCULAR | Status: DC
  Administered 2022-12-11 (×2): 7 [IU] via SUBCUTANEOUS
  Administered 2022-12-12 (×3): 4 [IU] via SUBCUTANEOUS
  Administered 2022-12-13: 3 [IU] via SUBCUTANEOUS
  Administered 2022-12-13: 4 [IU] via SUBCUTANEOUS
  Administered 2022-12-14: 3 [IU] via SUBCUTANEOUS
  Administered 2022-12-15: 2 [IU] via SUBCUTANEOUS
  Administered 2022-12-15 – 2022-12-16 (×2): 3 [IU] via SUBCUTANEOUS
  Administered 2022-12-16 – 2022-12-17 (×2): 11 [IU] via SUBCUTANEOUS
  Administered 2022-12-17: 4 [IU] via SUBCUTANEOUS
  Administered 2022-12-18 (×2): 11 [IU] via SUBCUTANEOUS

## 2022-12-11 MED ORDER — INSULIN ASPART 100 UNIT/ML IJ SOLN
0.0000 [IU] | Freq: Every day | INTRAMUSCULAR | Status: DC
  Administered 2022-12-14 – 2022-12-28 (×7): 2 [IU] via SUBCUTANEOUS

## 2022-12-11 NOTE — Progress Notes (Signed)
Recreational Therapy Session Note  Patient Details  Name: [name] MRN: [id number] Date of Birth: 01/26/1951 Today's Date: 12/11/2022  Pain: c/o intermittent knee pain, relieved with repositioning. Skilled Therapeutic Interventions/Progress Updates: Session focused on the use of deep breathing/relaxation during mobility tasks during co-treat with PT.  Pt anxious with movement, fear of falling and anticipates pain.  Given moderate instructional cues, pt incorporated deep breathing into mobility tasks (sit->stands from EOM with STEDY and with RW).  Provided pt with large post it note to post in the room listing her accomplishments through her LOS to assist in mood, recognizing progress she is making vs focusing on what what she can't and hopefully reducing anxiety.  Pt appreciative but needs continued reenforcement.    Therapy/Group: Co-Treatment  Jarren Para 12/11/2022, 1:57 PM

## 2022-12-11 NOTE — Progress Notes (Signed)
Occupational Therapy Session Note  Patient Details  Name: [name] MRN: [id number] Date of Birth: Apr 02, 1951  Today's Date: 12/11/2022 OT Individual Time: 0800-0900 OT Individual Time Calculation (min): 60 min    Short Term Goals: Week 1:  OT Short Term Goal 1 (Week 1): Pt will complete STS transfer with Mod A + LRAD in preparation for standing ADL tasks. OT Short Term Goal 2 (Week 1): Pt will complete simple bathing activities with consistent Mod A + LRAD. OT Short Term Goal 3 (Week 1): Pt will complete LB dressing with Mod A + LRAD.  Skilled Therapeutic Interventions/Progress Updates:    Pt received supine with no c/o pain, agreeable to OT session.  Pt came to EOB with (S) ! Heavy use of bed rail. She stood in the stedy with mod A from EOB. She was transferred to the bariatric BSC and she voided urine. She completed peri hygiene anteriorly seated with CGA and cueing for technique. She completed sit > stand in the stedy with only min A ! She was transferred back to the w/c via stedy- standing from perch with CGA. She completed UB ADLs at the sink with provided LH sponge with (S). Pants were donned with total A +2. She stood from the w/c using the sink for UE support with mod A to elevate hips and then +2 assist to bring hips forward d/t anxiety setting in and pt panicking about falling. B knee blocking and foot blocking. She was left sitting up in the w/c with all needs met.   Therapy Documentation Precautions:  Precautions Precautions: Shoulder Type of Shoulder Precautions: R-shoulder pain with ROM/WB Precaution Comments: History of frequent falls at home. Restrictions Weight Bearing Restrictions: No  Therapy/Group: Individual Therapy  Curtis Sites 12/11/2022, 7:13 AM

## 2022-12-11 NOTE — Progress Notes (Signed)
Physical Therapy Session Note  Patient Details  Name: Sai Moura MRN: [id number] Date of Birth: 07-27-1951  Today's Date: 12/11/2022 PT Individual Time: [id number] and [id number]  PT Individual Time Calculation (min): 68 min and 81 min  Short Term Goals: Week 1:  PT Short Term Goal 1 (Week 1): pt will be mod assist in slide board transfers PT Short Term Goal 2 (Week 1): pt will be mod assist with bed mobility PT Short Term Goal 3 (Week 1): pt will be maxA x1 w/ sit to stand trasnfer  Skilled Therapeutic Interventions/Progress Updates: Tx1: Pt presented in w/c agreeable to therapy. Pt states pain controlled at moment, in shoulder and knee. Pt transported to rehab gym total A for time management. Pt required increased time for all mobility due to anxiety. Lisa,RT arrived and present throughout rest of session.  Pt able to scoot anteriorly in w/c with minA fading to CGA. Pt set up with Stedy and performed Stedy transfer to high/low mat requiring modA x 2. At mat pt performed Sit to stand from elevated mat with stedy with minA. Pt performed x 2 with standing tolerance up to 74min in [place]. Stedy then removed and provided with HDRW. With increased time/effort performed Sit to stand with heavy modA x 2 and RW. Pt with increased anxiety and although was able to maintain mostly erect posture and did not demonstrate any significant instability pt fearful of falling and was only able to stand ~20 sec. Provided encouragement and emotional support explaining that pt was able to successfully stand with RW and will continue to progress slowly towards goals. Pt then provided with [facility] and was able to stand with minA and remove [place] form mirror using LUE to work on decreasing reliance on BUE. On final stand pt transferred back to w/c with Stedy and with Stedy in place once seated pt was able to lean forward and scoot posteriorly in recliner. Pt transported back to room at end of session and remained in w/c with R arm  rest removed and pillow placed under R arm for support. Pt left with call bell within reach and needs met.    Tx2: Pt presented in w/c eating lunch agreeable to therapy. Pt states some increased pain in RUE and L knee, did not rate with rest breaks provided as needed during session. Pt transported to rehab gym for energy conservation. Due to pants not fully over hips Stedy obtained and performed Sit to stand with heavy modA x 2 to stand and transferred to high/low mat. At mat worked on unsupported sitting and core activation performing ball kicks initially without weights then 1.5lb ankle cuffs added for increased ms recruitment. Pt then participated in ball taps with emphasis on not leaning back as well as maintaining R foot on ground. PTA providing education to lean forward when tapping for ball as well as avoiding kicking RLE out. Pt initially with difficulty as pt stating unable to tell if foot is on ground but significantly improved when rehab tech placed hand behind pt to provide biofeedback when kicking ball. Pt then performed stand with Stedy and placed horseshoes on basketball net 6 x 2 with seated rest between bouts. On second round PTA providing multimodal cues to improve weight shift to R for improved posture with fair carryover. Pt transferred to w/c and returned to sitting with modA x 1 for controlled descent. Pt then transported to [facility] entrance for a few minutes outside with pt's disposition greatly improving and appreciative of fresh  air. Pt transported back to room at end of session and remained in w/c at end of session with call bell within reach and needs met.      Therapy Documentation Precautions:  Precautions Precautions: Shoulder Type of Shoulder Precautions: R-shoulder pain with ROM/WB Precaution Comments: History of frequent falls at home. Restrictions Weight Bearing Restrictions: No General:   Vital Signs: Therapy Vitals Temp: 98.4 F (36.9 C) Temp Source:  Oral   Therapy/Group: Individual Therapy  Ben Habermann 12/11/2022, 4:25 PM

## 2022-12-11 NOTE — Progress Notes (Signed)
PROGRESS NOTE   Subjective/Complaints: Working with therapy at the bedside. Has heating pad to R shoulder- this is helping her pain. She had an episode of hypogylemia yesterday. She had BM last night. She had nausea with vomiting yesterday after drinking soup and taking sorbitol.   ROS: +urinary frequency, +insomnia-improving. +dizziness, , Denies fevers, chills, CP, SOB, abd pain,  dysuria, new/worsening numbness, tingling, focal weakness + anxiety- chronic, worse when she is standing- improving + chronic R shoulder pain + N/V yesterday- has resolved  Objective:   No results found. No results for input(s): "WBC", "HGB", "HCT", "PLT" in the last 72 hours.  Recent Labs    12/10/22 0529 12/11/22 0541  NA 139 139  K 5.4* 4.3  CL 100 101  CO2 32 29  GLUCOSE 97 169*  BUN 7* 7*  CREATININE 0.65 0.67  CALCIUM 9.4 9.6   Urinalysis    Component Value Date/Time   COLORURINE YELLOW 12/05/2022 1630   APPEARANCEUR CLOUDY (A) 12/05/2022 1630   LABSPEC 1.008 12/05/2022 1630   PHURINE 7.0 12/05/2022 1630   GLUCOSEU NEGATIVE 12/05/2022 1630   HGBUR MODERATE (A) 12/05/2022 1630   BILIRUBINUR NEGATIVE 12/05/2022 1630   KETONESUR NEGATIVE 12/05/2022 1630   PROTEINUR NEGATIVE 12/05/2022 1630   NITRITE NEGATIVE 12/05/2022 1630   LEUKOCYTESUR LARGE (A) 12/05/2022 1630  Urine Micro 12/05/22: Bacteria: few RBC: 21-50 Squamous: 0-5 WBC clumps: present WBC: >50  UCx 12/05/22: pending    Shoulder Xray 11/30/22 Postsurgical changes are noted in the proximal right humerus with deformity identified. Underlying bony thorax appears within normal limits. No fracture or dislocation is seen. Stable bone island is noted in the mid humerus. No new focal abnormality is seen.   IMPRESSION: Postsurgical  changes without acute abnormality.     Intake/Output Summary (Last 24 hours) at 12/11/2022 0815 Last data filed at 12/10/2022 1400 Gross  per 24 hour  Intake 120 ml  Output --  Net 120 ml         Physical Exam: Vital Signs Blood pressure (!) 155/89, pulse 82, temperature 97.9 F (36.6 C), temperature source Oral, resp. rate 18, height 5\' 7"  (1.702 m), weight 88.5 kg, SpO2 95 %.  Physical Exam Constitution: Appropriate appearance for age. No apparent distress +Obese, sitting in [facility] working with therapy HEENT: R pupil fixed, dilated, blind; L pupil reactive, EOM grossly intact.  Resp: CTAB. No rales, rhonchi, or wheezing. Cardio:IIR Abdomen: Nondistended. Nontender. +bowel sounds. Psych: Appropriate mood and affect. pleasant Skin: BLE with ichthyosis. R foot mepilex dressing in place. Finger bilateral hand flushed appearing. Trace b/l  edema    Neurologic/MsK:   Sensory exam: revealed reduced sensation to light touch over right plantar foot and heel. . Motor exam:  Moving all 4 extremities R shoulder tenderness with palpation and Rom in all directions. Heating pad on R shoulder.   Prior Exam      Strength:                RUE: 4/5 SA, 5-/5 EF, 5-/5 EE, 5/5 WE, 5/5 FF, 5/5 FA                 LUE: 5/5 SA, 5/5  EF, 5/5 EE, 5/5 WE, 5/5 FF, 5/5 FA                 RLE: 3+/5 HF, 4+/5 KE, 4/5 DF,  5/5 PF                 LLE:  3-/5 HF, 3+/5 KE, 5/5 DF,  5/5 PF  Coordination: Fine motor coordination was normal.      Assessment/Plan: 1. Functional deficits which require 3+ hours per day of interdisciplinary therapy in a comprehensive inpatient rehab setting. Physiatrist is providing close team supervision and 24 hour management of active medical problems listed below. Physiatrist and rehab team continue to assess barriers to discharge/monitor patient progress toward functional and medical goals  Care Tool:  Bathing    Body parts bathed by patient: Right arm, Chest, Abdomen, Front perineal area, Right upper leg, Left upper leg, Face   Body parts bathed by helper: Left arm, Buttocks, Right lower leg, Left lower leg      Bathing assist Assist Level: Moderate Assistance - Patient 50 - 74%     Upper Body Dressing/Undressing Upper body dressing   What is the patient wearing?: Pull over shirt    Upper body assist Assist Level: Contact Guard/Touching assist    Lower Body Dressing/Undressing Lower body dressing      What is the patient wearing?: Incontinence brief, Pants     Lower body assist Assist for lower body dressing: Total Assistance - Patient < 25%     Toileting Toileting    Toileting assist Assist for toileting: Total Assistance - Patient < 25%     Transfers Chair/bed transfer  Transfers assist     Chair/bed transfer assist level: 2 Helpers     Locomotion Ambulation   Ambulation assist   Ambulation activity did not occur: Safety/medical concerns          Walk 10 feet activity   Assist  Walk 10 feet activity did not occur: Safety/medical concerns        Walk 50 feet activity   Assist Walk 50 feet with 2 turns activity did not occur: Safety/medical concerns         Walk 150 feet activity   Assist Walk 150 feet activity did not occur: Safety/medical concerns         Walk 10 feet on uneven surface  activity   Assist Walk 10 feet on uneven surfaces activity did not occur: Safety/medical concerns         Wheelchair     Assist Is the patient using a wheelchair?: Yes Type of Wheelchair: Manual    Wheelchair assist level: Dependent - Patient 0% Max wheelchair distance: 150    Wheelchair 50 feet with 2 turns activity    Assist        Assist Level: Dependent - Patient 0%   Wheelchair 150 feet activity     Assist      Assist Level: Dependent - Patient 0%   Blood pressure (!) 155/89, pulse 82, temperature 97.9 F (36.6 C), temperature source Oral, resp. rate 18, height 5\' 7"  (1.702 m), weight 88.5 kg, SpO2 95 %.  Medical Problem List and Plan: 1. Functional deficits secondary to debility due to acute on chronic HFpEF,  atrial fibrillation with RVR             -patient may shower             -ELOS/Goals: 10-14 days, Min A/Mod A-gait  -Continue CIR, PT/OT  2.  Antithrombotics: -DVT/anticoagulation:  Lovenox 40 mg daily (No full-dose AC d/t Hx SAH 2007)             -antiplatelet therapy: ASA 325 daily per IM until OP cards f/u 3. Pain Management:  Oxycodone or tramadol prn.  -encourage use of Voltaren gel 2g QID for OA right shoulder/right knee.   4. Mood/Behavior/Sleep: LCSW to follow for evaluation and support.              -Melatonin prn--12/05/22 ordered as scheduled 5mg  QHS, Trazodone PRN   -12/06/22 sleeping improving, continue current regimen             -antipsychotic agents: N/A  -Nortriptyline 10mg  QHS, Neurontin 100mg  QHS 5. Neuropsych/cognition: This patient is capable of making decisions on her own behalf.  -start xanax 0.25 prn for anxiety  -Consulted Psychiatry- psychiatry reviewed her case and case discussed with  12/08/22, psych recommended continuation current medications encourage therapy and did not feel this issue would benefit from inpatient psych, ok to cancel consult.   6. Skin/Wound Care: Add lotion to BLE and hands             -routine pressure relief measures.  7. Fluids/Electrolytes/Nutrition: Monitor I/O. Intake improving. Monitor CMP 12/07/22 and weekly BMPs starting 12/14/22 -May need to stay dry to avoid fluid overload. 8. Acute on chronic diastolic CHF: Strict I/O w/daily weights. -Cardiac diet. Monitor for signs of overload.  -continue Lasix 40mg  QD, Aldactone 12.5mg  QD, metoprolol 100mg  QD, pravastatin 40mg  QD and Losartan 25mg  QD -monitor for recurrent hyperkalemia (Losartan dose decreased 01/14) -1/26 weight trending down, continue to monitor Filed Weights   12/09/22 0500 12/10/22 0500 12/11/22 0500  Weight: 94 kg 89.9 kg 88.5 kg     9. A Fib w/RVR: Monitor HR TID--continue Cardizem 180mg  QD and Metoprolol 100mg  QD 10. HTN: BPs with some lability,  130s-160s SBP -12/05/22 monitor for now, Losartan decreased 11/29/22, could consider going back to home dosing if appropriate -1/23 BP has been elevated, will increase losartan to 50mg   1/26 fair control, continue current regimen Vitals:   12/07/22 1922 12/08/22 0324 12/08/22 1454 12/08/22 1957  BP: (!) 148/65 (!) 156/70 117/61 120/64   12/09/22 0317 12/09/22 1308 12/09/22 1947 12/10/22 0356  BP: (!) 140/70 129/64 (!) 151/77 (!) 160/75   12/10/22 0818 12/10/22 1309 12/10/22 1930 12/11/22 0443  BP: 136/82 125/72 135/72 (!) 155/89     11.  T1DM: Hgb A1c- 7.4. Monitor BS ac/hs and use SSI for elevated BS             -continue insulin glargline 15 units daily.  -May need to add meal coverage-->used 5- 7units tid prn PTA.  -12/05/22 CBGs 170s-200s, will increase Semglee to 16U QD, monitor -1/24 add 2 units mealtime insulin -1/25 increase mealtime to 4 units insulin, decrease lantus to 14 units, glucose has been higher in the day and lower in the Ams 1/26 decrease mealtime insulin to 3 units due to hypoglycemia, suspect this may have been due to poor PO intake yesterday, she refused semglee yesterday- will change to daytime  CBG (last 3)  Recent Labs    12/10/22 2052 12/10/22 2238 12/11/22 0616  GLUCAP 44* 121* 169*      12. Chronic bilateral shoulder and left knee pain: Oxycodone prn -Refusing voltaren gel for local measures-->encourage use             -Aquathermia for local measures.              -  L knee: Follows OP ortho, no benefit from injections in the past  13. Upper respiratory infection, Nocturnal hypoxia: Question sleep apnea.  -Encourage pulmonary hygiene -Respiratory viral panel negative x2 inpatient -Remains with cough, intermittent SOB - add PRN inhaler, monitor  14.  Pre-renal azotemia: Likely due to diuresis. BUN up but SCr improving.   -Continue to monitor.  -1/22 BUN and CR WNL  15. Overactive bladder, incontinence: hx of, on Myrbetriq 50mg  QD; now off  purewick -12/05/22 having increased frequency and incontinence, ordered U/A, UCx, and scheduled voiding program -12/06/22 U/A c/w UTI, see #16 -Continue myrbetriq,she is mostly continent with occasional incontinent episodes  16. UTI 12/06/22:  -U/A c/w UTI, ordered Macrobid 100mg  BID x5 days (12/06/22>>12/10/22 last dose), UCx pending- Echoli sensitive to macrobid  17.  Hypokalemia  -31meq x2, recheck tomorrow  -K+ up to 3.6, losartan increased today,  Recheck tomorrow  -1/25 K+ now elevated hyperkalemia at 5.4, lokelma 5g  x1, stop potassium supplementation, discussed with pharmacy, recheck tomorrow  1/26 K+ down to 4.3, recheck monday  18. Dizziness -1/22 Will start meclizine 12.5mg  PRN, check orthostatic VS  -She has HX of vertigo going back to 2013, seen by Dr. Jacelyn G[facility], felt to be related to vestibulopathy  19. Constipation  -Schedule miralax -1/26 had vomiting with  sorbitol yesterday, continue miralax daily, she had large BM yesterday  20. R shoulder pain  -Xray on 1/15 with postsurgical changes, continue voltaren gel    LOS: 7 days A FACE TO FACE EVALUATION WAS PERFORMED  Jennye Boroughs 12/11/2022, 8:15 AM

## 2022-12-11 NOTE — Plan of Care (Signed)
  Problem: RH BLADDER ELIMINATION Goal: RH STG MANAGE BLADDER WITH ASSISTANCE Description: STG Manage Bladder With mod I Assistance Outcome: Not Progressing; incontinence at times   

## 2022-12-12 LAB — GLUCOSE, CAPILLARY
Glucose-Capillary: 198 mg/dL — ABNORMAL HIGH (ref 70–99)
Glucose-Capillary: 194 mg/dL — ABNORMAL HIGH (ref 70–99)
Glucose-Capillary: 165 mg/dL — ABNORMAL HIGH (ref 70–99)
Glucose-Capillary: 105 mg/dL — ABNORMAL HIGH (ref 70–99)

## 2022-12-12 MED ORDER — MELATONIN 5 MG PO TABS
5.0000 mg | ORAL_TABLET | Freq: Every evening | ORAL | Status: DC | PRN
  Filled 2022-12-12: qty 1

## 2022-12-12 NOTE — Progress Notes (Signed)
PROGRESS NOTE   Subjective/Complaints: Feeling better today, had BM yesterday and is urinating ok. Slept poorly but thinks that the melatonin is actually making it worse-- would prefer this to be PRN. Pain in the R shoulder doing ok, at baseline level of discomfort. Denies other complaints today.   ROS: +urinary frequency-improved, +insomnia-improving. +dizziness.  Denies fevers, chills, CP, SOB, abd pain, n/v/d/c, dysuria, new/worsening numbness, tingling, focal weakness + anxiety- chronic, worse when she is standing- improving + chronic R shoulder pain  Objective:   No results found. No results for input(s): "WBC", "HGB", "HCT", "PLT" in the last 72 hours.  Recent Labs    12/10/22 0529 12/11/22 0541  NA 139 139  K 5.4* 4.3  CL 100 101  CO2 32 29  GLUCOSE 97 169*  BUN 7* 7*  CREATININE 0.65 0.67  CALCIUM 9.4 9.6    Urinalysis    Component Value Date/Time   COLORURINE YELLOW 12/05/2022 1630   APPEARANCEUR CLOUDY (A) 12/05/2022 1630   LABSPEC 1.008 12/05/2022 1630   PHURINE 7.0 12/05/2022 1630   GLUCOSEU NEGATIVE 12/05/2022 1630   HGBUR MODERATE (A) 12/05/2022 1630   BILIRUBINUR NEGATIVE 12/05/2022 1630   KETONESUR NEGATIVE 12/05/2022 1630   PROTEINUR NEGATIVE 12/05/2022 1630   NITRITE NEGATIVE 12/05/2022 1630   LEUKOCYTESUR LARGE (A) 12/05/2022 1630  Urine Micro 12/05/22: Bacteria: few RBC: 21-50 Squamous: 0-5 WBC clumps: present WBC: >50  UCx 12/05/22: >=100,000 COLONIES/mL ESCHERICHIA COLI  >=100,000 COLONIES/mL AEROCOCCUS SPECIES  Resistant to Ampicillin and Bactrim, intermediate to Amp/Sulbactam    Shoulder Xray 11/30/22 Postsurgical changes are noted in the proximal right humerus with deformity identified. Underlying bony thorax appears within normal limits. No fracture or dislocation is seen. Stable bone island is noted in the mid humerus. No new focal abnormality is seen.    IMPRESSION: Postsurgical  changes without acute abnormality.     Intake/Output Summary (Last 24 hours) at 12/12/2022 0700 Last data filed at 12/12/2022 0130 Gross per 24 hour  Intake 120 ml  Output 200 ml  Net -80 ml         Physical Exam: Vital Signs Blood pressure (!) 143/69, pulse 87, temperature 97.9 F (36.6 C), temperature source Oral, resp. rate 18, height 5\' 7"  (1.702 m), weight 88.1 kg, SpO2 94 %.  Physical Exam Constitution: Appropriate appearance for age. No apparent distress +Obese, sitting in [facility] working with therapy HEENT: R pupil fixed, dilated, blind; L pupil reactive, EOM grossly intact.  Resp: CTAB. No rales, rhonchi, or wheezing. Cardio: IIR Abdomen: Nondistended. Nontender. +bowel sounds. Psych: Appropriate mood and affect. pleasant Skin: BLE with ichthyosis. R foot mepilex dressing in place. Finger bilateral hand flushed appearing. Trace b/l  edema-improved today    Neurologic/MsK:   Sensory exam: revealed reduced sensation to light touch over right plantar foot and heel. . Motor exam:  Moving all 4 extremities R shoulder tenderness with palpation and Rom in all directions. Heating pad on R shoulder.   Prior Exam      Strength:                RUE: 4/5 SA, 5-/5 EF, 5-/5 EE, 5/5 WE, 5/5 FF, 5/5 FA  LUE: 5/5 SA, 5/5 EF, 5/5 EE, 5/5 WE, 5/5 FF, 5/5 FA                 RLE: 3+/5 HF, 4+/5 KE, 4/5 DF,  5/5 PF                 LLE:  3-/5 HF, 3+/5 KE, 5/5 DF,  5/5 PF  Coordination: Fine motor coordination was normal.      Assessment/Plan: 1. Functional deficits which require 3+ hours per day of interdisciplinary therapy in a comprehensive inpatient rehab setting. Physiatrist is providing close team supervision and 24 hour management of active medical problems listed below. Physiatrist and rehab team continue to assess barriers to discharge/monitor patient progress toward functional and medical goals  Care Tool:  Bathing    Body parts  bathed by patient: Right arm, Chest, Abdomen, Front perineal area, Right upper leg, Left upper leg, Face, Left arm, Buttocks, Left lower leg, Right lower leg   Body parts bathed by helper: Left arm, Buttocks, Right lower leg, Left lower leg     Bathing assist Assist Level: Moderate Assistance - Patient 50 - 74%     Upper Body Dressing/Undressing Upper body dressing   What is the patient wearing?: Pull over shirt    Upper body assist Assist Level: Supervision/Verbal cueing    Lower Body Dressing/Undressing Lower body dressing      What is the patient wearing?: Incontinence brief, Pants     Lower body assist Assist for lower body dressing: 2 Helpers     Toileting Toileting    Toileting assist Assist for toileting: Moderate Assistance - Patient 50 - 74%     Transfers Chair/bed transfer  Transfers assist     Chair/bed transfer assist level: 2 Helpers     Locomotion Ambulation   Ambulation assist   Ambulation activity did not occur: Safety/medical concerns          Walk 10 feet activity   Assist  Walk 10 feet activity did not occur: Safety/medical concerns        Walk 50 feet activity   Assist Walk 50 feet with 2 turns activity did not occur: Safety/medical concerns         Walk 150 feet activity   Assist Walk 150 feet activity did not occur: Safety/medical concerns         Walk 10 feet on uneven surface  activity   Assist Walk 10 feet on uneven surfaces activity did not occur: Safety/medical concerns         Wheelchair     Assist Is the patient using a wheelchair?: Yes Type of Wheelchair: Manual    Wheelchair assist level: Dependent - Patient 0% Max wheelchair distance: 150    Wheelchair 50 feet with 2 turns activity    Assist        Assist Level: Dependent - Patient 0%   Wheelchair 150 feet activity     Assist      Assist Level: Dependent - Patient 0%   Blood pressure (!) 143/69, pulse 87,  temperature 97.9 F (36.6 C), temperature source Oral, resp. rate 18, height 5\' 7"  (1.702 m), weight 88.1 kg, SpO2 94 %.  Medical Problem List and Plan: 1. Functional deficits secondary to debility due to acute on chronic HFpEF, atrial fibrillation with RVR             -patient may shower             -ELOS/Goals:  10-14 days, Min A/Mod A-gait  -Continue CIR, PT/OT   2.  Antithrombotics: -DVT/anticoagulation:  Lovenox 40 mg daily (No full-dose AC d/t Hx SAH 2007)             -antiplatelet therapy: ASA 325 daily per IM until OP cards f/u 3. Pain Management:  Oxycodone or tramadol prn.  -encourage use of Voltaren gel 2g QID for OA right shoulder/right knee.   4. Mood/Behavior/Sleep: LCSW to follow for evaluation and support.              -Melatonin prn--12/05/22 ordered as scheduled 5mg  QHS, Trazodone PRN   -12/06/22 sleeping improving, continue current regimen -12/12/22 wants melatonin to be changed to PRN, ordered this; advised she will need to request sleep meds if she needs them             -antipsychotic agents: N/A  -Nortriptyline 10mg  QHS, Neurontin 100mg  QHS 5. Neuropsych/cognition: This patient is capable of making decisions on her own behalf.  -start xanax 0.25 prn for anxiety -Consulted Psychiatry- psychiatry reviewed her case and case discussed with  Sheran Fava, psych recommended continuation current medications encourage therapy and did not feel this issue would benefit from inpatient psych, ok to cancel consult.   6. Skin/Wound Care: Add lotion to BLE and hands             -routine pressure relief measures.  7. Fluids/Electrolytes/Nutrition: Monitor I/O. Intake improving. Monitor CMP 12/07/22 and weekly BMPs starting 12/14/22 -May need to stay dry to avoid fluid overload. 8. Acute on chronic diastolic CHF: Strict I/O w/daily weights. -Cardiac diet. Monitor for signs of overload.  -continue Lasix 40mg  QD, Aldactone 12.5mg  QD, metoprolol 100mg  QD, pravastatin 40mg  QD and  Losartan 50mg  QD -monitor for recurrent hyperkalemia (Losartan dose decreased 01/14) -12/12/22 wt stable, continue to monitor Filed Weights   12/10/22 0500 12/11/22 0500 12/12/22 0446  Weight: 89.9 kg 88.5 kg 88.1 kg     9. A Fib w/RVR: Monitor HR TID--continue Cardizem 180mg  QD and Metoprolol 100mg  QD 10. HTN: BPs with some lability, 130s-160s SBP -12/05/22 monitor for now, Losartan decreased 11/29/22, could consider going back to home dosing if appropriate -1/23 BP has been elevated, will increase losartan to 50mg   -12/12/22 overall fair control, continue current regimen Vitals:   12/08/22 1454 12/08/22 1957 12/09/22 0317 12/09/22 1308  BP: 117/61 120/64 (!) 140/70 129/64   12/09/22 1947 12/10/22 0356 12/10/22 0818 12/10/22 1309  BP: (!) 151/77 (!) 160/75 136/82 125/72   12/10/22 1930 12/11/22 0443 12/11/22 1919 12/12/22 0324  BP: 135/72 (!) 155/89 (!) 140/74 (!) 143/69     11.  T1DM: Hgb A1c- 7.4. Monitor BS ac/hs and use SSI for elevated BS             -continue insulin glargline 15 units daily.  -May need to add meal coverage-->used 5- 7units tid prn PTA.  -12/05/22 CBGs 170s-200s, will increase Semglee to 16U QD, monitor -1/24 add 2 units mealtime insulin -1/25 increase mealtime to 4 units insulin, decrease lantus to 14 units, glucose has been higher in the day and lower in the Ams -1/26 decrease mealtime insulin to 3 units due to hypoglycemia, suspect this may have been due to poor PO intake yesterday, she refused semglee yesterday- will change to daytime -12/12/22 CBGs 200s overnight, monitor for now since Semglee adjusted yesterday  CBG (last 3)  Recent Labs    12/11/22 1645 12/11/22 2043 12/12/22 0605  GLUCAP 215* 198* 198*  12. Chronic bilateral shoulder and left knee pain: Oxycodone prn -Refusing voltaren gel for local measures-->encourage use             -Aquathermia for local measures.              -L knee: Follows OP ortho, no benefit from injections in the  past  13. Upper respiratory infection, Nocturnal hypoxia: Question sleep apnea.  -Encourage pulmonary hygiene -Respiratory viral panel negative x2 inpatient -Remains with cough, intermittent SOB - add PRN inhaler, monitor  14.  Pre-renal azotemia: Likely due to diuresis. BUN up but SCr improving.   -Continue to monitor.  -1/22 BUN and CR WNL, monitor on weekly labs starting 12/14/22  15. Overactive bladder, incontinence: hx of, on Myrbetriq 50mg  QD; now off purewick -12/05/22 having increased frequency and incontinence, ordered U/A, UCx, and scheduled voiding program -12/06/22 U/A c/w UTI, see #16 -Continue myrbetriq,she is mostly continent with occasional incontinent episodes  16. UTI 12/06/22:  -U/A c/w UTI, ordered Macrobid 100mg  BID x5 days (12/06/22>>12/10/22 last dose), UCx pending- Ecoli sensitive to macrobid, completed course, symptoms resolved  17.  Hypokalemia  -14meq x2, recheck tomorrow  -K+ up to 3.6, losartan increased today, Recheck tomorrow -1/25 K+ now elevated hyperkalemia at 5.4, lokelma 5g  x1, stop potassium supplementation, discussed with pharmacy, recheck tomorrow  -1/26 K+ down to 4.3, recheck Monday 12/14/22  18. Dizziness -1/22 Will start meclizine 12.5mg  PRN, check orthostatic VS -She has HX of vertigo going back to 2013, seen by Dr. Jacelyn Grip Neurology, felt to be related to vestibulopathy  19. Constipation  -Schedule miralax, PRN dulcolax supp and fleet enema -1/26 had vomiting with  sorbitol yesterday, continue miralax daily, she had large BM yesterday -12/12/22 LBM yesterday, continue current regimen  20. R shoulder pain  -Xray on 1/15 with postsurgical changes, continue voltaren gel    LOS: 8 days A FACE TO [place] 12/12/2022, 7:00 AM

## 2022-12-13 LAB — GLUCOSE, CAPILLARY
Glucose-Capillary: 93 mg/dL (ref 70–99)
Glucose-Capillary: 167 mg/dL — ABNORMAL HIGH (ref 70–99)
Glucose-Capillary: 125 mg/dL — ABNORMAL HIGH (ref 70–99)
Glucose-Capillary: 145 mg/dL — ABNORMAL HIGH (ref 70–99)
Glucose-Capillary: 146 mg/dL — ABNORMAL HIGH (ref 70–99)

## 2022-12-13 NOTE — Progress Notes (Signed)
Physical Therapy Session Note  Patient Details  Name: [name] MRN: [id number] Date of Birth: 1951/09/01  Today's Date: 12/13/2022 PT Individual Time: 1400-1500 PT Individual Time Calculation (min): 60 min   Short Term Goals: Week 1:  PT Short Term Goal 1 (Week 1): pt will be mod assist in slide board transfers PT Short Term Goal 2 (Week 1): pt will be mod assist with bed mobility PT Short Term Goal 3 (Week 1): pt will be maxA x1 w/ sit to stand trasnfer  Skilled Therapeutic Interventions/Progress Updates:      Therapy Documentation Precautions:  Precautions Precautions: Shoulder Type of Shoulder Precautions: R-shoulder pain with ROM/WB Precaution Comments: History of frequent falls at home. Restrictions Weight Bearing Restrictions: No  Pt agreeable to PT session with emphasis on transfer training. Pt reports 5/10 pain, pre-medicated and provided rest for relief.   Pt with heightened anxiety regarding mobility. PT educated on benefits of mental imagery for increased neural pathway activation. Pt participated in mental imagery (cooking in the kitchen) to address anxiety and increase confidence with mobility.   Pt participated in blocked practice of forward trunk flexion rocks and blue swiss ball UE walk outs to encourage momentum and desensitize fear associated with anterior lean. Pt requires max A x 1 with sit to stand c 5 with PT positioned anterior to patient.   Pt transported throughout session by w/c total A for time management and energy conservation. Pt left seated in w/c at bedside with all needs in reach.   Nurse provided patient with anxiety medications mid session.   Therapy/Group: Individual Therapy  Verl Dicker Verl Dicker PT, DPT  12/13/2022, 8:01 AM

## 2022-12-13 NOTE — Progress Notes (Signed)
Occupational Therapy Weekly Progress Note  Patient Details  Name: [name] MRN: [id number] Date of Birth: 28-Mar-1951  Beginning of progress report period: December 05, 2022 End of progress report period: December 13, 2022  Today's Date: 12/13/2022 OT Individual Time: 2725-3664 OT Individual Time Calculation (min): 60 min    Patient has met 2 of 3 short term goals.  [name] has made good progress this reporting period despite severely limiting anxiety. Pt has progressed well with familiar therapists to combat anxiety. She is not consistent yet but frequently able to stand with mod A from elevated surfaces using the stedy. She is completing UB ADLs with (S) and LB with total A. Family edu will be scheduled closer to d/ c.   Patient continues to demonstrate the following deficits: muscle weakness, decreased cardiorespiratoy endurance, and decreased sitting balance, decreased standing balance, decreased postural control, decreased balance strategies, and severe self limiting anxiety  and therefore will continue to benefit from skilled OT intervention to enhance overall performance with BADL and Reduce care partner burden.  Patient progressing toward long term goals..  Continue plan of care.  OT Short Term Goals Week 1:  OT Short Term Goal 1 (Week 1): Pt will complete STS transfer with Mod A + LRAD in preparation for standing ADL tasks. OT Short Term Goal 1 - Progress (Week 1): Met OT Short Term Goal 2 (Week 1): Pt will complete simple bathing activities with consistent Mod A + LRAD. OT Short Term Goal 2 - Progress (Week 1): Met OT Short Term Goal 3 (Week 1): Pt will complete LB dressing with Mod A + LRAD. OT Short Term Goal 3 - Progress (Week 1): Not met Week 2:  OT Short Term Goal 1 (Week 2): Pt will stand without the stedy with max of one person OT Short Term Goal 2 (Week 2): Pt will complete LB bathing tasks at bed level with min A OT Short Term Goal 3 (Week 2): Pt will stand statically for  5 min in prep for more functional ADL transfers  Skilled Therapeutic Interventions/Progress Updates:    Pt received supine with no c/o pain, agreeable to OT session. She came to EOB with (S) with heavy use of bed rail. She stood from elevated EOB using the stedy with min A! She  was transferred to the bariatric BSC via stedy- standing from perched seat with CGA. She voided urine. She completed anterior hygiene with CGA seated. She stood from the [facility] in the stedy with min A!! Great progress this morning. She completed UB ADLs and grooming tasks at the sink with (S). She required OT to thread pants over BLE but then stood at the sink with mod A!! Excellent initiation and anxiety much lower. +2 present to assist with pulling pants up. Patient required increased time for initiation, cuing, rest breaks, and for completion of tasks throughout session. Utilized therapeutic use of self throughout to promote efficiency. Pt was left sitting up in the w/c with all needs met and call bell within reach.    Therapy Documentation Precautions:  Precautions Precautions: Shoulder Type of Shoulder Precautions: R-shoulder pain with ROM/WB Precaution Comments: History of frequent falls at home. Restrictions Weight Bearing Restrictions: No   Therapy/Group: Individual Therapy  Curtis Sites 12/13/2022, 9:22 AM

## 2022-12-13 NOTE — Progress Notes (Signed)
PROGRESS NOTE   Subjective/Complaints: Feeling pretty well today, R shoulder pain is doing better today, likes the Kpad. Slept great last night! Hasn't had a BM since 12/11/22 but doesn't feel constipated, and wants to hold off on changing any medications for now. Denies any other complaints right now.   ROS: +urinary frequency-improved, +insomnia-improved. +dizziness.  Denies fevers, chills, CP, SOB, abd pain, n/v/d/c, dysuria, new/worsening numbness, tingling, focal weakness + anxiety- chronic, worse when she is standing- improving + chronic R shoulder pain-stable  Objective:   No results found. No results for input(s): "WBC", "HGB", "HCT", "PLT" in the last 72 hours.  Recent Labs    12/11/22 0541  NA 139  K 4.3  CL 101  CO2 29  GLUCOSE 169*  BUN 7*  CREATININE 0.67  CALCIUM 9.6      Shoulder Xray 11/30/22 Postsurgical changes are noted in the proximal right humerus with deformity identified. Underlying bony thorax appears within normal limits. No fracture or dislocation is seen. Stable bone island is noted in the mid humerus. No new focal abnormality is seen.   IMPRESSION: Postsurgical  changes without acute abnormality.     Intake/Output Summary (Last 24 hours) at 12/13/2022 0705 Last data filed at 12/12/2022 1900 Gross per 24 hour  Intake 960 ml  Output 650 ml  Net 310 ml         Physical Exam: Vital Signs Blood pressure (!) 156/75, pulse 85, temperature 97.9 F (36.6 C), temperature source Oral, resp. rate 18, height 5\' 7"  (1.702 m), weight 88.4 kg, SpO2 100 %.  Physical Exam Constitution: Appropriate appearance for age. No apparent distress +Obese, laying in bed HEENT: R pupil fixed, dilated, blind; L pupil reactive, EOM grossly intact.  Resp: CTAB. No rales, rhonchi, or wheezing. Cardio: IIR Abdomen: Nondistended. Nontender. +bowel sounds. Psych: Appropriate mood and affect. pleasant Skin: BLE  with ichthyosis. R foot mepilex dressing in place. Finger bilateral hand flushed appearing. No appreciable LE edema today.     Neurologic/MsK:   Sensory exam: revealed reduced sensation to light touch over right plantar foot and heel. . Motor exam:  Moving all 4 extremities R shoulder tenderness with palpation and Rom in all directions. Heating pad on R shoulder.   Prior Exam      Strength:                RUE: 4/5 SA, 5-/5 EF, 5-/5 EE, 5/5 WE, 5/5 FF, 5/5 FA                 LUE: 5/5 SA, 5/5 EF, 5/5 EE, 5/5 WE, 5/5 FF, 5/5 FA                 RLE: 3+/5 HF, 4+/5 KE, 4/5 DF,  5/5 PF                 LLE:  3-/5 HF, 3+/5 KE, 5/5 DF,  5/5 PF  Coordination: Fine motor coordination was normal.      Assessment/Plan: 1. Functional deficits which require 3+ hours per day of interdisciplinary therapy in a comprehensive inpatient rehab setting. Physiatrist is providing close team supervision and 24 hour management of active  medical problems listed below. Physiatrist and rehab team continue to assess barriers to discharge/monitor patient progress toward functional and medical goals  Care Tool:  Bathing    Body parts bathed by patient: Right arm, Chest, Abdomen, Front perineal area, Right upper leg, Left upper leg, Face, Left arm, Buttocks, Left lower leg, Right lower leg   Body parts bathed by helper: Left arm, Buttocks, Right lower leg, Left lower leg     Bathing assist Assist Level: Moderate Assistance - Patient 50 - 74%     Upper Body Dressing/Undressing Upper body dressing   What is the patient wearing?: Pull over shirt    Upper body assist Assist Level: Supervision/Verbal cueing    Lower Body Dressing/Undressing Lower body dressing      What is the patient wearing?: Incontinence brief, Pants     Lower body assist Assist for lower body dressing: 2 Helpers     Toileting Toileting    Toileting assist Assist for toileting: Moderate Assistance - Patient 50 - 74%      Transfers Chair/bed transfer  Transfers assist     Chair/bed transfer assist level: 2 Helpers     Locomotion Ambulation   Ambulation assist   Ambulation activity did not occur: Safety/medical concerns          Walk 10 feet activity   Assist  Walk 10 feet activity did not occur: Safety/medical concerns        Walk 50 feet activity   Assist Walk 50 feet with 2 turns activity did not occur: Safety/medical concerns         Walk 150 feet activity   Assist Walk 150 feet activity did not occur: Safety/medical concerns         Walk 10 feet on uneven surface  activity   Assist Walk 10 feet on uneven surfaces activity did not occur: Safety/medical concerns         Wheelchair     Assist Is the patient using a wheelchair?: Yes Type of Wheelchair: Manual    Wheelchair assist level: Dependent - Patient 0% Max wheelchair distance: 150    Wheelchair 50 feet with 2 turns activity    Assist        Assist Level: Dependent - Patient 0%   Wheelchair 150 feet activity     Assist      Assist Level: Dependent - Patient 0%   Blood pressure (!) 156/75, pulse 85, temperature 97.9 F (36.6 C), temperature source Oral, resp. rate 18, height 5\' 7"  (1.702 m), weight 88.4 kg, SpO2 100 %.  Medical Problem List and Plan: 1. Functional deficits secondary to debility due to acute on chronic HFpEF, atrial fibrillation with RVR             -patient may shower             -ELOS/Goals: 10-14 days, Min A/Mod A-gait  -Continue CIR, PT/OT  2.  Antithrombotics: -DVT/anticoagulation:  Lovenox 40 mg daily (No full-dose AC d/t Hx SAH 2007)             -antiplatelet therapy: ASA 325 daily per IM until OP cards f/u 3. Pain Management:  Oxycodone or tramadol prn. Kpad ordered -encourage use of Voltaren gel 2g QID for OA right shoulder/right knee.   4. Mood/Behavior/Sleep: LCSW to follow for evaluation and support.              -Melatonin prn--12/05/22  ordered as scheduled 5mg  QHS, Trazodone PRN   -12/06/22 sleeping improving,  continue current regimen -12/12/22 wants melatonin to be changed to PRN, ordered this; advised she will need to request sleep meds if she needs them -12/13/22 improved, cont regimen             -antipsychotic agents: N/A  -Nortriptyline 10mg  QHS, Neurontin 100mg  QHS 5. Neuropsych/cognition: This patient is capable of making decisions on her own behalf.  -start xanax 0.25 prn for anxiety -Consulted Psychiatry- psychiatry reviewed her case and case discussed with  , psych recommended continuation current medications encourage therapy and did not feel this issue would benefit from inpatient psych, ok to cancel consult.   6. Skin/Wound Care: Add lotion to BLE and hands             -routine pressure relief measures.  7. Fluids/Electrolytes/Nutrition: Monitor I/O. Intake improving. Monitor weekly BMPs starting 12/14/22 -May need to stay dry to avoid fluid overload. 8. Acute on chronic diastolic CHF: Strict I/O w/daily weights. -Cardiac diet. Monitor for signs of overload.  -continue Lasix 40mg  QD, Aldactone 12.5mg  QD, metoprolol 100mg  QD, pravastatin 40mg  QD and Losartan 50mg  QD -monitor for recurrent hyperkalemia (Losartan dose decreased 01/14) -12/13/22 wt stable, continue to monitor Filed Weights   12/11/22 0500 12/12/22 0446 12/13/22 0454  Weight: 88.5 kg 88.1 kg 88.4 kg     9. A Fib w/RVR: Monitor HR TID--continue Cardizem 180mg  QD and Metoprolol 100mg  QD 10. HTN: BPs with some lability, 130s-160s SBP -12/05/22 monitor for now, Losartan decreased 11/29/22, could consider going back to home dosing if appropriate -1/23 BP has been elevated, will increase losartan to 50mg   -12/13/22 variable but overall fair control, continue current regimen Vitals:   12/09/22 1947 12/10/22 0356 12/10/22 0818 12/10/22 1309  BP: (!) 151/77 (!) 160/75 136/82 125/72   12/10/22 1930 12/11/22 0443 12/11/22 1919 12/12/22 0324   BP: 135/72 (!) 155/89 (!) 140/74 (!) 143/69   12/12/22 0844 12/12/22 1307 12/12/22 1930 12/13/22 0427  BP: 131/63 (!) 118/50 (!) 115/53 (!) 156/75     11.  T1DM: Hgb A1c- 7.4. Monitor BS ac/hs and use SSI for elevated BS             -continue insulin glargline 15 units daily.  -May need to add meal coverage-->used 5- 7units tid prn PTA.  -12/05/22 CBGs 170s-200s, will increase Semglee to 16U QD, monitor -1/24 add 2 units mealtime insulin -1/25 increase mealtime to 4 units insulin, decrease lantus to 14 units, glucose has been higher in the day and lower in the Ams -1/26 decrease mealtime insulin to 3 units due to hypoglycemia, suspect this may have been due to poor PO intake yesterday, she refused semglee yesterday- will change to daytime -12/12/22 CBGs 200s overnight, monitor for now since Semglee adjusted yesterday -12/13/22 CBGs improving, cont current regimen  CBG (last 3)  Recent Labs    12/12/22 1646 12/12/22 2058 12/13/22 0559  GLUCAP 165* 105* 93      12. Chronic bilateral shoulder and left knee pain: Oxycodone prn -Refusing voltaren gel for local measures-->encourage use             -Aquathermia for local measures.              -L knee: Follows OP ortho, no benefit from injections in the past  13. Upper respiratory infection, Nocturnal hypoxia: Question sleep apnea.  -Encourage pulmonary hygiene -Respiratory viral panel negative x2 inpatient -Remains with cough, intermittent SOB - add PRN inhaler, monitor  14.  Pre-renal azotemia: Likely due to diuresis.  BUN up but SCr improving.   -Continue to monitor.  -1/22 BUN and CR WNL, monitor on weekly labs starting 12/14/22  15. Overactive bladder, incontinence: hx of, on Myrbetriq 50mg  QD; now off purewick -12/05/22 having increased frequency and incontinence, ordered U/A, UCx, and scheduled voiding program -12/06/22 U/A c/w UTI, see #16 -Continue myrbetriq,she is mostly continent with occasional incontinent episodes  16.  UTI 12/06/22:  -U/A c/w UTI, ordered Macrobid 100mg  BID x5 days (12/06/22>>12/10/22 last dose), UCx pending- Ecoli sensitive to macrobid, completed course, symptoms resolved  17.  Hypokalemia  -29meq x2, recheck tomorrow  -K+ up to 3.6, losartan increased today, Recheck tomorrow -1/25 K+ now elevated hyperkalemia at 5.4, lokelma 5g  x1, stop potassium supplementation, discussed with pharmacy, recheck tomorrow  -1/26 K+ down to 4.3, recheck Monday 12/14/22  18. Dizziness -1/22 Will start meclizine 12.5mg  PRN, check orthostatic VS -She has HX of vertigo going back to 2013, seen by Dr. Jacelyn Grip [facility], felt to be related to vestibulopathy  19. Constipation: reported as chronic  -Schedule miralax QD, PRN dulcolax supp and fleet enema -1/26 had vomiting with  sorbitol yesterday, continue miralax daily, she had large BM yesterday -12/13/22 LBM 2 days ago but doesn't feel constipated, states this is typical for her, wants to hold off on med changes-- monitor, if no BM by tomorrow then may want to consider adding a scheduled dose of Colace or other softener  20. R shoulder pain  -Xray on 1/15 with postsurgical changes, continue voltaren gel, Kpad    LOS: 9 days A FACE TO [place] 12/13/2022, 7:05 AM

## 2022-12-14 DIAGNOSIS — I5032 Chronic diastolic (congestive) heart failure: Secondary | ICD-10-CM

## 2022-12-14 LAB — BASIC METABOLIC PANEL
Anion gap: 8 (ref 5–15)
BUN: 15 mg/dL (ref 8–23)
CO2: 28 mmol/L (ref 22–32)
Calcium: 9.6 mg/dL (ref 8.9–10.3)
Chloride: 100 mmol/L (ref 98–111)
Creatinine, Ser: 0.76 mg/dL (ref 0.44–1.00)
GFR, Estimated: >60 mL/min (ref >60)
Glucose, Bld: 122 mg/dL — ABNORMAL HIGH (ref 70–99)
Potassium: 4.1 mmol/L (ref 3.5–5.1)
Sodium: 136 mmol/L (ref 135–145)

## 2022-12-14 LAB — CBC
HCT: 44.1 % (ref 36.0–46.0)
Hemoglobin: 13.6 g/dL (ref 12.0–15.0)
MCH: 26.5 pg (ref 26.0–34.0)
MCHC: 30.8 g/dL (ref 30.0–36.0)
MCV: 85.8 fL (ref 80.0–100.0)
Platelets: 307 10*3/uL (ref 150–400)
RBC: 5.14 MIL/uL — ABNORMAL HIGH (ref 3.87–5.11)
RDW: 16.2 % — ABNORMAL HIGH (ref 11.5–15.5)
WBC: 6.1 10*3/uL (ref 4.0–10.5)
nRBC: 0 % (ref 0.0–0.2)

## 2022-12-14 LAB — GLUCOSE, CAPILLARY
Glucose-Capillary: 127 mg/dL — ABNORMAL HIGH (ref 70–99)
Glucose-Capillary: 94 mg/dL (ref 70–99)
Glucose-Capillary: 105 mg/dL — ABNORMAL HIGH (ref 70–99)
Glucose-Capillary: 214 mg/dL — ABNORMAL HIGH (ref 70–99)

## 2022-12-14 MED ORDER — POLYETHYLENE GLYCOL 3350 17 G PO PACK
17.0000 g | PACK | Freq: Two times a day (BID) | ORAL | Status: DC
  Administered 2022-12-14 – 2022-12-17 (×5): 17 g via ORAL
  Filled 2022-12-14 (×10): qty 1

## 2022-12-14 NOTE — Progress Notes (Signed)
   12/14/22 1015  What Happened  Was fall witnessed? Yes  Who witnessed fall? Vladimir Creeks PT; Bari Edward  Patients activity before fall during therapy  Point of contact buttocks  Was patient injured? No  Provider Notification  Provider Name/Title Pam LOve PA  Date Provider Notified 12/14/22  Time Provider Notified 32  Method of Notification Face-to-face  Notification Reason Fall  Provider response No new orders  Date of Provider Response 12/14/22  Time of Provider Response 1025  Follow Up  Family notified No - patient refusal (patient refused)  Simple treatment Other (comment) (pt was not hurt)  Progress note created (see row info) Yes  Adult Fall Risk Assessment  Risk Factor Category (scoring not indicated) High fall risk per protocol (document High fall risk)  Age 24  Fall History: Fall within 6 months prior to admission 0  Elimination; Bowel and/or Urine Incontinence 2  Elimination; Bowel and/or Urine Urgency/Frequency 2  Medications: includes PCA/Opiates, Anti-convulsants, Anti-hypertensives, Diuretics, Hypnotics, Laxatives, Sedatives, and Psychotropics 5  Patient Care Equipment 2  Mobility-Assistance 2  Mobility-Gait 2  Mobility-Sensory Deficit 0  Altered awareness of immediate physical environment 0  Impulsiveness 0  Lack of understanding of one's physical/cognitive limitations 0  Total Score 17  Patient Fall Risk Level High fall risk  Adult Fall Risk Interventions  Required Bundle Interventions *See Row Information* High fall risk - low, moderate, and high requirements implemented  Additional Interventions Use of appropriate toileting equipment (bedpan, BSC, etc.)  Fall intervention(s) refused/Patient educated regarding refusal Open door if unsupervised;Yellow bracelet;Supervision while toileting/edge of bed sitting;Nonskid socks;Bed alarm  Screening for Fall Injury Risk (To be completed on HIGH fall risk patients) - Assessing Need for Floor Mats  Risk For Fall Injury-  Criteria for Floor Mats None identified - No additional interventions needed  Pain Assessment  Pain Scale 0-10  Pain Score 3  Pain Type Chronic pain  Pain Location Shoulder  Pain Orientation Right  Pain Descriptors / Indicators Aching  Pain Frequency Constant  Pain Onset On-going  Pain Intervention(s) Other (Comment)  Hot/Cold Interventions  Hot/Cold Interventions K-Pad/Aquathermia  K-Pad Status Off - No adverse reaction  K-Pad Q2h Skin Assessment No adverse effect  K-Pad Orientation Right  K-Pad Location shoulder  PCA/Epidural/Spinal Assessment  Respiratory Pattern Regular  Neurological  Neuro (WDL) WDL  Level of Consciousness Alert  Orientation Level Oriented X4  Cognition Appropriate at baseline  Speech Clear  RUE Motor Response Purposeful movement  RUE Sensation Full sensation  RUE Motor Strength 2  LUE Motor Response Purposeful movement  LUE Sensation Full sensation  LUE Motor Strength 4  RLE Motor Response Purposeful movement  RLE Sensation Full sensation  RLE Motor Strength 3  LLE Motor Response Purposeful movement  LLE Sensation Full sensation  LLE Motor Strength 4  Neuro Symptoms Anxiety  Musculoskeletal  Musculoskeletal (WDL) X  Assistive Device Stedy  Generalized Weakness Yes  Weight Bearing Restrictions No  Integumentary  Integumentary (WDL) X  Skin Color Appropriate for ethnicity  Skin Condition Dry  Skin Integrity Other (Comment)  Erythema/Redness Location Leg  Erythema/Redness Location Orientation Bilateral;Lower  Pain Assessment  Date Pain First Started  (admission)  Result of Injury No  Pain Assessment  Work-Related Injury No  Pain Screening  Response to Interventions pain medication helps per patient  Effect of Pain on Daily Activities per patient it helps prior to  activity  Clinical Progression Gradually improving

## 2022-12-14 NOTE — Progress Notes (Signed)
Physical Therapy Session Note  Patient Details  Name: [name] MRN: [id number] Date of Birth: 06-23-1951  Today's Date: 12/14/2022 PT Individual Time: 2426-8341 PT Individual Time Calculation (min): 90 min   Short Term Goals: Week 1:  PT Short Term Goal 1 (Week 1): pt will be mod assist in slide board transfers PT Short Term Goal 2 (Week 1): pt will be mod assist with bed mobility PT Short Term Goal 3 (Week 1): pt will be maxA x1 w/ sit to stand trasnfer  Skilled Therapeutic Interventions/Progress Updates:    Chart reviewed and pt agreeable to therapy. Pt received semi-reclined in bed with no c/o pain. Session focused on functional transfers, trunk strengthening, and BLE strengthening to promote decreased dependence with functional movement to access home. Pt initiated session with dependent donning of LE clothing and socks. Pt also completed R/L rolling with ModA + bedrails. Pt then sat EOB using ModA + bed features. Pt then completed series of forward leans and BLE leg press into floor with RW positioned in front of pt and CGA. Pt completed series of partial sit to stands using same leaning and floor pressing technique. Pt completed 6 partials stands with MaxA+ RW, with noted hip clearance on last partial stand. PT then called NT into room to support STEDY transfer. Pt completed sit to stand in STEDY with ModA + 2 + STEDY. Pt transferred to [facility]. At [facility], pt completed 4 sit to stands in STEDY with MinA + 1 and 30 sec stand holds. Pt then lowered to WC. Pt transferred to therapy gym for time management. In gym, pt set up at [facility]. Pt cued to lean forward and practice BLE marching at [employer]. Of note, pt progressed from 70 cm/sec to 45 cm/sec. Pt then returned to room and stated she would like to scoot back in the [facility]. Pt informed that she was scooted back in the [facility] with only 1 inch remaining at back. PT offered pillow, but pt stated she would rather scoot back. Pt attempted to scoot without success.  PT informed pt that STEDY could not get close enough for final inch in chair. PT set up pt at bedrail and called NT for support. PT blocked L knee and attempted MaxA + 2 + bedrail stand with pt. Pt only came to partial stand. As pt sat in chair, she pushed hips forward. Pt stated she felt like she was slipping and continued to push hips forwards with cues to avoid. Pt then progressed hips past front of WC. PT and NT were unable to lift pt back onto [facility], so PT and NT completed a slow and controlled descent to the ground while maintaining upper body and head support. Pt asked if she felt anything had hit or scrapped, which she verbalized no. NT and PT also confirmed no body part had hit ground or scrapped. RN then called to room with lift and pt was returned to bed dependently with hoyer lift. Team confirmed again no pain and injury during controlled descent. At end of session, pt was left semi-reclined in bed with RN present, alarm engaged, nurse call bell and all needs in reach.     Therapy Documentation Precautions:  Precautions Precautions: Shoulder Type of Shoulder Precautions: R-shoulder pain with ROM/WB Precaution Comments: History of frequent falls at home. Restrictions Weight Bearing Restrictions: No    Therapy/Group: Individual Therapy  Marquette Old, PT, DPT 12/14/2022, 3:21 PM

## 2022-12-14 NOTE — Progress Notes (Addendum)
PROGRESS NOTE   Subjective/Complaints: No new complaints or concerns this AM. She is a little frustrated because she would like to progress faster.    Review of Systems  Constitutional:  Negative for chills and fever.  Respiratory:  Negative for shortness of breath.   Cardiovascular:  Negative for chest pain.  Gastrointestinal:  Negative for nausea and vomiting.  Genitourinary:  Positive for frequency.  Musculoskeletal:  Positive for joint pain.  Neurological:  Positive for dizziness.  Psychiatric/Behavioral:  The patient has insomnia.     Objective:   No results found. Recent Labs    12/14/22 0557  WBC 6.1  HGB 13.6  HCT 44.1  PLT 307    Recent Labs    12/14/22 0557  NA 136  K 4.1  CL 100  CO2 28  GLUCOSE 122*  BUN 15  CREATININE 0.76  CALCIUM 9.6      Shoulder Xray 11/30/22 Postsurgical changes are noted in the proximal right humerus with deformity identified. Underlying bony thorax appears within normal limits. No fracture or dislocation is seen. Stable bone island is noted in the mid humerus. No new focal abnormality is seen.   IMPRESSION: Postsurgical  changes without acute abnormality.     Intake/Output Summary (Last 24 hours) at 12/14/2022 0819 Last data filed at 12/13/2022 1810 Gross per 24 hour  Intake 540 ml  Output --  Net 540 ml         Physical Exam: Vital Signs Blood pressure 123/69, pulse 73, temperature 97.9 F (36.6 C), temperature source Oral, resp. rate 18, height 5\' 7"  (1.702 m), weight 88 kg, SpO2 93 %.  Physical Exam Constitution: Appropriate appearance for age. No apparent distress +Obese, sitting in bed HEENT: R pupil fixed, dilated, blind; L pupil reactive, EOM grossly intact.  Resp: CTAB. No rales, rhonchi, or wheezing. Cardio: IIR, normal rate Abdomen: Nondistended. Nontender. +bowel sounds. Psych: Appropriate mood and affect. pleasant Skin: BLE with  ichthyosis. R foot mepilex dressing in place. Finger bilateral hand flushed appearing. No appreciable LE edema today.     Neurologic/MsK:   Sensory exam: revealed reduced sensation to light touch over right plantar foot and heel. . Motor exam:  Moving all 4 extremities R shoulder tenderness with palpation and Rom in all directions. Heating pad on R shoulder.   Prior Exam      Strength:                RUE: 4/5 SA, 5-/5 EF, 5-/5 EE, 5/5 WE, 5/5 FF, 5/5 FA                 LUE: 5/5 SA, 5/5 EF, 5/5 EE, 5/5 WE, 5/5 FF, 5/5 FA                 RLE: 3+/5 HF, 4+/5 KE, 4/5 DF,  5/5 PF                 LLE:  3-/5 HF, 3+/5 KE, 5/5 DF,  5/5 PF  Coordination: Fine motor coordination was normal.      Assessment/Plan: 1. Functional deficits which require 3+ hours per day of interdisciplinary therapy in a comprehensive inpatient rehab  setting. Physiatrist is providing close team supervision and 24 hour management of active medical problems listed below. Physiatrist and rehab team continue to assess barriers to discharge/monitor patient progress toward functional and medical goals  Care Tool:  Bathing    Body parts bathed by patient: Right arm, Chest, Abdomen, Front perineal area, Right upper leg, Left upper leg, Face, Left arm, Buttocks, Left lower leg, Right lower leg   Body parts bathed by helper: Left arm, Buttocks, Right lower leg, Left lower leg     Bathing assist Assist Level: Moderate Assistance - Patient 50 - 74%     Upper Body Dressing/Undressing Upper body dressing   What is the patient wearing?: Pull over shirt    Upper body assist Assist Level: Supervision/Verbal cueing    Lower Body Dressing/Undressing Lower body dressing      What is the patient wearing?: Incontinence brief, Pants     Lower body assist Assist for lower body dressing: 2 Helpers     Toileting Toileting    Toileting assist Assist for toileting: Moderate Assistance - Patient 50 - 74%      Transfers Chair/bed transfer  Transfers assist     Chair/bed transfer assist level: Dependent - mechanical lift     Locomotion Ambulation   Ambulation assist   Ambulation activity did not occur: Safety/medical concerns          Walk 10 feet activity   Assist  Walk 10 feet activity did not occur: Safety/medical concerns        Walk 50 feet activity   Assist Walk 50 feet with 2 turns activity did not occur: Safety/medical concerns         Walk 150 feet activity   Assist Walk 150 feet activity did not occur: Safety/medical concerns         Walk 10 feet on uneven surface  activity   Assist Walk 10 feet on uneven surfaces activity did not occur: Safety/medical concerns         Wheelchair     Assist Is the patient using a wheelchair?: Yes Type of Wheelchair: Manual    Wheelchair assist level: Dependent - Patient 0% Max wheelchair distance: 150    Wheelchair 50 feet with 2 turns activity    Assist        Assist Level: Dependent - Patient 0%   Wheelchair 150 feet activity     Assist      Assist Level: Dependent - Patient 0%   Blood pressure 123/69, pulse 73, temperature 97.9 F (36.6 C), temperature source Oral, resp. rate 18, height 5\' 7"  (1.702 m), weight 88 kg, SpO2 93 %.  Medical Problem List and Plan: 1. Functional deficits secondary to debility due to acute on chronic HFpEF, atrial fibrillation with RVR             -patient may shower             -ELOS/Goals: 10-14 days, Min A/Mod A-gait  -Continue CIR, PT/OT  2.  Antithrombotics: -DVT/anticoagulation:  Lovenox 40 mg daily (No full-dose AC d/t Hx SAH 2007)             -antiplatelet therapy: ASA 325 daily per IM until OP cards f/u 3. Pain Management:  Oxycodone or tramadol prn. Kpad ordered -encourage use of Voltaren gel 2g QID for OA right shoulder/right knee.   4. Mood/Behavior/Sleep: LCSW to follow for evaluation and support.              -Melatonin  prn--12/05/22 ordered as scheduled 5mg  QHS, Trazodone PRN   -12/06/22 sleeping improving, continue current regimen -12/12/22 wants melatonin to be changed to PRN, ordered this; advised she will need to request sleep meds if she needs them -12/13/22 improved, cont regimen             -antipsychotic agents: N/A  -Nortriptyline 10mg  QHS, Neurontin 100mg  QHS 5. Neuropsych/cognition: This patient is capable of making decisions on her own behalf.  -start xanax 0.25 prn for anxiety -Consulted Psychiatry- psychiatry reviewed her case and case discussed with  12/15/22, psych recommended continuation current medications encourage therapy and did not feel this issue would benefit from inpatient psych, ok to cancel consult.   6. Skin/Wound Care: Add lotion to BLE and hands             -routine pressure relief measures.  7. Fluids/Electrolytes/Nutrition: Monitor I/O. Intake improving. Monitor weekly BMPs starting 12/14/22 -May need to stay dry to avoid fluid overload. 8. Acute on chronic diastolic CHF: Strict I/O w/daily weights. -Cardiac diet. Monitor for signs of overload.  -continue Lasix 40mg  QD, Aldactone 12.5mg  QD, metoprolol 100mg  QD, pravastatin 40mg  QD and Losartan 50mg  QD -monitor for recurrent hyperkalemia (Losartan dose decreased 01/14) -1/29 wt stable continue to monitor Filed Weights   12/12/22 0446 12/13/22 0454 12/14/22 0448  Weight: 88.1 kg 88.4 kg 88 kg     9. A Fib w/RVR: Monitor HR TID--continue Cardizem 180mg  QD and Metoprolol 100mg  QD 10. HTN: BPs with some lability, 130s-160s SBP -12/05/22 monitor for now, Losartan decreased 11/29/22, could consider going back to home dosing if appropriate -1/23 BP has been elevated, will increase losartan to 50mg   -12/13/22 variable but overall fair control, continue current regimen Vitals:   12/10/22 1309 12/10/22 1930 12/11/22 0443 12/11/22 1919  BP: 125/72 135/72 (!) 155/89 (!) 140/74   12/12/22 0324 12/12/22 0844 12/12/22 1307  12/12/22 1930  BP: (!) 143/69 131/63 (!) 118/50 (!) 115/53   12/13/22 0427 12/13/22 1317 12/13/22 1951 12/14/22 0310  BP: (!) 156/75 (!) 110/53 115/63 123/69     11.  T1DM: Hgb A1c- 7.4. Monitor BS ac/hs and use SSI for elevated BS             -continue insulin glargline 15 units daily.  -May need to add meal coverage-->used 5- 7units tid prn PTA.  -12/05/22 CBGs 170s-200s, will increase Semglee to 16U QD, monitor -1/24 add 2 units mealtime insulin -1/25 increase mealtime to 4 units insulin, decrease lantus to 14 units, glucose has been higher in the day and lower in the Ams -1/26 decrease mealtime insulin to 3 units due to hypoglycemia, suspect this may have been due to poor PO intake yesterday, she refused semglee yesterday- will change to daytime -12/12/22 CBGs 200s overnight, monitor for now since Semglee adjusted yesterday -1/29 well controlled, continue current regimen  CBG (last 3)  Recent Labs    12/13/22 1647 12/13/22 2031 12/14/22 0607  GLUCAP 145* 146* 127*      12. Chronic bilateral shoulder and left knee pain: Oxycodone prn -Refusing voltaren gel for local measures-->encourage use             -Aquathermia for local measures.              -L knee: Follows OP ortho, no benefit from injections in the past  13. Upper respiratory infection, Nocturnal hypoxia: Question sleep apnea.  -Encourage pulmonary hygiene -Respiratory viral panel negative x2 inpatient -Remains with cough, intermittent SOB - add  PRN inhaler, monitor  14.  Pre-renal azotemia: Likely due to diuresis. BUN up but SCr improving.   -Continue to monitor.  -1/29 BUN and Cr WNL  15. Overactive bladder, incontinence: hx of, on Myrbetriq 50mg  QD; now off purewick -12/05/22 having increased frequency and incontinence, ordered U/A, UCx, and scheduled voiding program -12/06/22 U/A c/w UTI, see #16 -Continue myrbetriq,she is mostly continent with occasional incontinent episodes  16. UTI 12/06/22:  -U/A c/w UTI,  ordered Macrobid 100mg  BID x5 days (12/06/22>>12/10/22 last dose), UCx pending- Ecoli sensitive to macrobid, completed course, symptoms resolved  17.  Hypokalemia  -12/08/22 x2, recheck tomorrow  -K+ up to 3.6, losartan increased today, Recheck tomorrow -1/25 K+ now elevated hyperkalemia at 5.4, lokelma 5g  x1, stop potassium supplementation, discussed with pharmacy, recheck tomorrow  -1/29 K+ stable at 4.1  18. Dizziness -1/22 Will start meclizine 12.5mg  PRN, check orthostatic VS -She has HX of vertigo going back to 2013, seen by Dr. 2/22 Neurology, felt to be related to vestibulopathy  19. Constipation: reported as chronic  -Schedule miralax QD, PRN dulcolax supp and fleet enema -1/26 had vomiting with  sorbitol yesterday, continue miralax daily, she had large BM yesterday -1/29 miralax increased to BID  20. R shoulder pain  -Xray on 1/15 with postsurgical changes, continue voltaren gel, Kpad   Addendum, fall yesterday, no injury reported   LOS: 10 days A FACE TO FACE EVALUATION WAS PERFORMED  05-02-1977 12/14/2022, 8:19 AM

## 2022-12-14 NOTE — Progress Notes (Signed)
Occupational Therapy Session Note  Patient Details  Name: [name] MRN: [id number] Date of Birth: [birth year]  Session 1: Today's Date: 12/14/2022 OT Individual Time: 1100-1200 OT Individual Time Calculation (min): 60 min   Session 2: OT Individual Time: 1415-1530 OT Individual Time Calculation (min): 75 mins  Short Term Goals: Week 2:  OT Short Term Goal 1 (Week 2): Pt will stand without the stedy with max of one person OT Short Term Goal 2 (Week 2): Pt will complete LB bathing tasks at bed level with min A OT Short Term Goal 3 (Week 2): Pt will stand statically for 5 min in prep for more functional ADL transfers  Skilled Therapeutic Interventions/Progress Updates:    Session 1: Patient agreeable to participate in OT session. Reports 0/10 pain level.  Pt reports that she was lowered to the ground with PT and NT this morning and that the maximove was used to help get her into bed.   Patient participated in skilled OT session focusing on core strengthening, functional transfers, and sit to stand transitions. Therapist educated pt on sidelying to sit transition versus crunching up to sit in order to facilitate increased hip flexion and encourage pt to achieve positions that encourage an upright seated posture versus posterior lean. Min A provided to transition from sidelying to seated EOB. Stedy used to complete functional transfer from bed to w/c with Mod A.  While seated inside parallel bars pt completed preparatory movements for sit to stand transitions. Focused on maintaining a seated upright posture in w/c first. Pt completed hip flexion using her left arm to pull herself forward using the parallel bars. Right hand was placed on pt's knee due to chronic shoulder issues. Pt then progressed to pulling herself forward while attempting to lift her buttocks up off the seat briefly. With Mod-max A, therapist provided manual assist to bring pt's buttocks off seated further. With one trial, pt  was able to successfully demonstrate one full sit to stand.   Session 2:  Patient agreeable to participate in OT session. Reports 0/10 pain level.   Patient participated in skilled OT session focusing on standing tolerance, standing progression. Therapist utilized standing frame to facilitate hip flexion, glute activation, and trunk extension in order to improve sit to stand transitions and improve ability to complete functional transfers and functional mobility at highest level of independence. Pt provided with step by step instructions prior to any movement/mobility activity to decrease anxiety and allow her to complete movement herself with control. Standing frame utilized to focus on sit to stand transitions. Pt utilized deck of cards while standing to pass time and work on attention to body position and muscle activation. Pt provided with VC initially when standing to bring her trunk forward versus in extension. Once provided initial cue, pt was able to monitor standing posture and make adjustments to maintain.   Standing trials: (extended seated rest breaks provided between standing trials) 2' 5'20"  5' 5'  While seated, pt participated in functional reaching activity utilized squigz and resistive clothespins focusing on forward trunk flexion that is needed to initiate sit to stands. 1lb wrist weight placed on LUE to work on shoulder strength and stability. Pt required min assist due to visual deficits for hand eye coordination.  At end of session, pt assisted with w/c mobility while self propelling chair using LUE and total assist provided for right UE due to RTC deficits. Pt able to assist from ortho gym first entrance to double doors of  gym.     Therapy Documentation Precautions:  Precautions Precautions: Shoulder Type of Shoulder Precautions: R-shoulder pain with ROM/WB Precaution Comments: History of frequent falls at home. Restrictions Weight Bearing Restrictions:  No  Therapy/Group: Individual Therapy  Ailene Ravel, OTR/L,CBIS  Supplemental OT - [facility] and [facility] Secure Chat Preferred   12/14/2022, 8:00 AM

## 2022-12-14 NOTE — Plan of Care (Addendum)
  Problem: RH BLADDER ELIMINATION Goal: RH STG MANAGE BLADDER WITH ASSISTANCE Description: STG Manage Bladder With mod I Assistance Outcome: Not Progressing; incontinence at times; time toileting   Problem: RH BOWEL ELIMINATION Goal: RH STG MANAGE BOWEL WITH ASSISTANCE Description: STG Manage Bowel with mod I Assistance. Outcome: Not Progressing; LBM per report 1/25; Patient offered other laxatives but she said she will try the miralax first; Started BID today.   Problem: RH SAFETY Goal: RH STG ADHERE TO SAFETY PRECAUTIONS W/ASSISTANCE/DEVICE Description: STG Adhere to Safety Precautions With cues Assistance/Device. Outcome: Not Progressing; anxiety when getting up; needs a lot of encouragement

## 2022-12-15 DIAGNOSIS — F419 Anxiety disorder, unspecified: Secondary | ICD-10-CM

## 2022-12-15 LAB — GLUCOSE, CAPILLARY
Glucose-Capillary: 138 mg/dL — ABNORMAL HIGH (ref 70–99)
Glucose-Capillary: 142 mg/dL — ABNORMAL HIGH (ref 70–99)
Glucose-Capillary: 91 mg/dL (ref 70–99)
Glucose-Capillary: 79 mg/dL (ref 70–99)

## 2022-12-15 MED ORDER — SORBITOL 70 % SOLN
30.0000 mL | Freq: Every day | Status: DC | PRN
  Filled 2022-12-15: qty 30

## 2022-12-15 NOTE — Progress Notes (Signed)
Occupational Therapy Session Note  Patient Details  Name: [name] MRN: [id number] Date of Birth: 1951-06-30  Session 1 Today's Date: 12/15/2022 OT Individual Time: 7846-9629 OT Individual Time Calculation (min): 54 min    Session 2 Today's Date: 12/15/2022 OT Individual Time: 1400-1520 OT Individual Time Calculation (min): 80 min    Short Term Goals: Week 2:  OT Short Term Goal 1 (Week 2): Pt will stand without the stedy with max of one person OT Short Term Goal 2 (Week 2): Pt will complete LB bathing tasks at bed level with min A OT Short Term Goal 3 (Week 2): Pt will stand statically for 5 min in prep for more functional ADL transfers  Skilled Therapeutic Interventions/Progress Updates:    Session 1 Pt received supine with no c/o pain, agreeable to OT session. Discussed yesterday's fall (lowering to the floor) and carryover to home and fall risk in general. Discussed posterior bias and impact on sliding out of chair. Pt came to EOB with min A, several LOB's posteriorly sitting but she was able to correct- often reliant on pulling on her legs. She finished her breakfast quickly. She donned pants EOB with mod A to thread- reporting she uses a reacher at home. Bed elevated and stedy placed and pt able to stand with CGA ! She stood from perched seat several more times with CGA. She was taken to the sink in the stedy (seated in perched position) and was able to brush teeth in standing position with CGA. She required an extended rest break following. She was transferred back to her chair. Extensive discussion re d/c planning. Discussed option of having stedy at home staying in CIR longer to work on stand pivot goals. Pt reports she will think about it and discuss with family. Pt left sitting up in w/c with all needs met.   Session 2 Pt received sitting up in the w/c with no c/o pain. Session focused on advancing confidence, self-efficacy, functional mobility, and core control in the the  [place]. She stood in the stedy from the w/c with mod A. She was transferred to EOB to don the harness for the litegait. She was able to remain standing in the stedy for several minutes while OT adjusted harness. Attempted to adjust litegait from EOB but d/t the bar under the bed this was not possible. Used the stedy and pt stood from EOB with CGA for transfer to the w/c. From the w/c she stood with the [place]. She required several minutes to become comfortable in the [place]. She completed 509 ft total of functional mobility in the [place]!!! Such incredible progress for pt and she was so encouraged by her progress. Patient required increased time for initiation, cuing, rest breaks, and for completion of tasks throughout session. Utilized therapeutic use of self throughout to promote efficiency. She was left sitting up in the w/c with all needs met. Encouraged pt to use heat and scheduled pain medication for anticipated soreness.     Therapy Documentation Precautions:  Precautions Precautions: Shoulder Type of Shoulder Precautions: R-shoulder pain with ROM/WB Precaution Comments: History of frequent falls at home. Restrictions Weight Bearing Restrictions: No   Therapy/Group: Individual Therapy  [name] 12/15/2022, 6:45 AM

## 2022-12-15 NOTE — Progress Notes (Addendum)
PROGRESS NOTE   Subjective/Complaints: Reports she feels "very good" this AM. She had a controlled fall yesterday but no injury.   This did worsen her anxiety about falling.  No new concerns.     Review of Systems  Constitutional:  Negative for chills and fever.  Eyes:        Baseline blind R eye  Respiratory:  Negative for shortness of breath.   Cardiovascular:  Negative for chest pain.  Gastrointestinal:  Positive for constipation. Negative for nausea and vomiting.  Genitourinary:  Positive for frequency.  Musculoskeletal:  Positive for joint pain.  Neurological:  Positive for dizziness. Negative for headaches.  Psychiatric/Behavioral:  The patient is nervous/anxious and has insomnia.     Objective:   No results found. Recent Labs    12/14/22 0557  WBC 6.1  HGB 13.6  HCT 44.1  PLT 307    Recent Labs    12/14/22 0557  NA 136  K 4.1  CL 100  CO2 28  GLUCOSE 122*  BUN 15  CREATININE 0.76  CALCIUM 9.6      Shoulder Xray 11/30/22 Postsurgical changes are noted in the proximal right humerus with deformity identified. Underlying bony thorax appears within normal limits. No fracture or dislocation is seen. Stable bone island is noted in the mid humerus. No new focal abnormality is seen.   IMPRESSION: Postsurgical  changes without acute abnormality.     Intake/Output Summary (Last 24 hours) at 12/15/2022 0960 Last data filed at 12/14/2022 1213 Gross per 24 hour  Intake --  Output 375 ml  Net -375 ml         Physical Exam: Vital Signs Blood pressure 135/66, pulse 79, temperature 98 F (36.7 C), temperature source Oral, resp. rate 18, height 5\' 7"  (1.702 m), weight 88.5 kg, SpO2 95 %.  Physical Exam Constitution: Appropriate appearance for age. No apparent distress +Obese, sitting in bed HEENT: R pupil fixed, dilated, blind; L pupil reactive, EOM grossly intact, + disconjugate gaze Resp: CTAB. No  rales, rhonchi, or wheezing. Cardio: IIR, normal rate Abdomen: Nondistended. Nontender. +bowel sounds. Psych: Appropriate mood and affect. pleasant Skin: BLE with ichthyosis. R foot mepilex dressing in place. Finger bilateral hand flushed appearing. No appreciable LE edema today.     Neurologic/MsK:   Sensory exam: revealed reduced sensation to light touch over right plantar foot and heel. . Motor exam:  Moving all 4 extremities R shoulder tenderness with palpation and Rom in all directions. Heating pad on R shoulder.   Prior Exam      Strength:                RUE: 4/5 SA, 5-/5 EF, 5-/5 EE, 5/5 WE, 5/5 FF, 5/5 FA                 LUE: 5/5 SA, 5/5 EF, 5/5 EE, 5/5 WE, 5/5 FF, 5/5 FA                 RLE: 3+/5 HF, 4+/5 KE, 4/5 DF,  5/5 PF                 LLE:  3-/5 HF, 3+/5 KE,  5/5 DF,  5/5 PF  Coordination: Fine motor coordination was normal.      Assessment/Plan: 1. Functional deficits which require 3+ hours per day of interdisciplinary therapy in a comprehensive inpatient rehab setting. Physiatrist is providing close team supervision and 24 hour management of active medical problems listed below. Physiatrist and rehab team continue to assess barriers to discharge/monitor patient progress toward functional and medical goals  Care Tool:  Bathing    Body parts bathed by patient: Right arm, Chest, Abdomen, Front perineal area, Right upper leg, Left upper leg, Face, Left arm, Buttocks, Left lower leg, Right lower leg   Body parts bathed by helper: Left arm, Buttocks, Right lower leg, Left lower leg     Bathing assist Assist Level: Moderate Assistance - Patient 50 - 74%     Upper Body Dressing/Undressing Upper body dressing   What is the patient wearing?: Pull over shirt    Upper body assist Assist Level: Supervision/Verbal cueing    Lower Body Dressing/Undressing Lower body dressing      What is the patient wearing?: Incontinence brief, Pants     Lower body assist  Assist for lower body dressing: 2 Helpers     Toileting Toileting    Toileting assist Assist for toileting: 2 Helpers     Transfers Chair/bed transfer  Transfers assist     Chair/bed transfer assist level: Dependent - mechanical lift     Locomotion Ambulation   Ambulation assist   Ambulation activity did not occur: Safety/medical concerns          Walk 10 feet activity   Assist  Walk 10 feet activity did not occur: Safety/medical concerns        Walk 50 feet activity   Assist Walk 50 feet with 2 turns activity did not occur: Safety/medical concerns         Walk 150 feet activity   Assist Walk 150 feet activity did not occur: Safety/medical concerns         Walk 10 feet on uneven surface  activity   Assist Walk 10 feet on uneven surfaces activity did not occur: Safety/medical concerns         Wheelchair     Assist Is the patient using a wheelchair?: Yes Type of Wheelchair: Manual    Wheelchair assist level: Dependent - Patient 0% Max wheelchair distance: 150    Wheelchair 50 feet with 2 turns activity    Assist        Assist Level: Dependent - Patient 0%   Wheelchair 150 feet activity     Assist      Assist Level: Dependent - Patient 0%   Blood pressure 135/66, pulse 79, temperature 98 F (36.7 C), temperature source Oral, resp. rate 18, height 5\' 7"  (1.702 m), weight 88.5 kg, SpO2 95 %.  Medical Problem List and Plan: 1. Functional deficits secondary to debility due to acute on chronic HFpEF, atrial fibrillation with RVR             -patient may shower             -ELOS/Goals: 10-14 days, Min A/Mod A-gait  -Continue CIR, PT/OT  -Team conference tomorrow  2.  Antithrombotics: -DVT/anticoagulation:  Lovenox 40 mg daily (No full-dose AC d/t Hx SAH 2007)             -antiplatelet therapy: ASA 325 daily per IM until OP cards f/u 3. Pain Management:  Oxycodone or tramadol prn. Kpad ordered -encourage use of  Voltaren gel 2g QID for OA right shoulder/right knee.   4. Mood/Behavior/Sleep: LCSW to follow for evaluation and support.              -Melatonin prn--12/05/22 ordered as scheduled 5mg  QHS, Trazodone PRN   -12/06/22 sleeping improving, continue current regimen -12/12/22 wants melatonin to be changed to PRN, ordered this; advised she will need to request sleep meds if she needs them -12/13/22 improved, cont regimen             -antipsychotic agents: N/A  -Nortriptyline 10mg  QHS, Neurontin 100mg  QHS 5. Neuropsych/cognition: This patient is capable of making decisions on her own behalf.  -start xanax 0.25 prn for anxiety -Consulted Psychiatry- psychiatry reviewed her case and case discussed with  Sheran Fava, psych recommended continuation current medications encourage therapy and did not feel this issue would benefit from inpatient psych, ok to cancel consult.   -Consider start buspar or SSRI tomorrow 6. Skin/Wound Care: Add lotion to BLE and hands             -routine pressure relief measures.  7. Fluids/Electrolytes/Nutrition: Monitor I/O. Intake improving. Monitor weekly BMPs starting 12/14/22 -May need to stay dry to avoid fluid overload. 8. Acute on chronic diastolic CHF: Strict I/O w/daily weights. -Cardiac diet. Monitor for signs of overload.  -continue Lasix 40mg  QD, Aldactone 12.5mg  QD, metoprolol 100mg  QD, pravastatin 40mg  QD and Losartan 50mg  QD -monitor for recurrent hyperkalemia (Losartan dose decreased 01/14) -1/29 wt stable continue to monitor Filed Weights   12/13/22 0454 12/14/22 0448 12/15/22 0548  Weight: 88.4 kg 88 kg 88.5 kg     9. A Fib w/RVR: Monitor HR TID--continue Cardizem 180mg  QD and Metoprolol 100mg  QD 10. HTN: BPs with some lability, 130s-160s SBP -12/05/22 monitor for now, Losartan decreased 11/29/22, could consider going back to home dosing if appropriate -1/23 BP has been elevated, will increase losartan to 50mg   -1/30 well controlled overall Vitals:    12/12/22 0324 12/12/22 0844 12/12/22 1307 12/12/22 1930  BP: (!) 143/69 131/63 (!) 118/50 (!) 115/53   12/13/22 0427 12/13/22 1317 12/13/22 1951 12/14/22 0310  BP: (!) 156/75 (!) 110/53 115/63 123/69   12/14/22 1024 12/14/22 1340 12/14/22 1946 12/15/22 0530  BP: (!) 149/86 (!) 95/57 101/71 135/66     11.  T1DM: Hgb A1c- 7.4. Monitor BS ac/hs and use SSI for elevated BS             -continue insulin glargline 15 units daily.  -May need to add meal coverage-->used 5- 7units tid prn PTA.  -12/05/22 CBGs 170s-200s, will increase Semglee to 16U QD, monitor -1/24 add 2 units mealtime insulin -1/25 increase mealtime to 4 units insulin, decrease lantus to 14 units, glucose has been higher in the day and lower in the Ams -1/26 decrease mealtime insulin to 3 units due to hypoglycemia, suspect this may have been due to poor PO intake yesterday, she refused semglee yesterday- will change to daytime -12/12/22 CBGs 200s overnight, monitor for now since Semglee adjusted yesterday -1/30, had 1 elevated CBG 214 yesterday, but overall controlled, monitor  CBG (last 3)  Recent Labs    12/14/22 1625 12/14/22 2026 12/15/22 0727  GLUCAP 105* 214* 138*      12. Chronic bilateral shoulder and left knee pain: Oxycodone prn -Refusing voltaren gel for local measures-->encourage use             -Aquathermia for local measures.              -  L knee: Follows OP ortho, no benefit from injections in the past  13. Upper respiratory infection, Nocturnal hypoxia: Question sleep apnea.  -Encourage pulmonary hygiene -Respiratory viral panel negative x2 inpatient -Remains with cough, intermittent SOB - add PRN inhaler, monitor  14.  Pre-renal azotemia: Likely due to diuresis. BUN up but SCr improving.   -Continue to monitor.  -1/29 BUN and Cr WNL  15. Overactive bladder, incontinence: hx of, on Myrbetriq 50mg  QD; now off purewick -12/05/22 having increased frequency and incontinence, ordered U/A, UCx, and  scheduled voiding program -12/06/22 U/A c/w UTI, see #16 -Continue myrbetriq,she is mostly continent with occasional incontinent episodes  16. UTI 12/06/22:  -U/A c/w UTI, ordered Macrobid 100mg  BID x5 days (12/06/22>>12/10/22 last dose), UCx pending- Ecoli sensitive to macrobid, completed course, symptoms resolved  17.  Hypokalemia  -27meq x2, recheck tomorrow  -K+ up to 3.6, losartan increased today, Recheck tomorrow -1/25 K+ now elevated hyperkalemia at 5.4, lokelma 5g  x1, stop potassium supplementation, discussed with pharmacy, recheck tomorrow  -1/29 K+ stable at 4.1  18. Dizziness -1/22 Will start meclizine 12.5mg  PRN, check orthostatic VS -She has HX of vertigo going back to 2013, seen by Dr. Jacelyn Grip [facility]rology, felt to be related to vestibulopathy 1/30 fall yesterday but no injury, consider schedule meclizine  19. Constipation: reported as chronic  -Schedule miralax QD, PRN dulcolax supp and fleet enema -1/26 had vomiting with  sorbitol yesterday, continue miralax daily, she had large BM yesterday -1/29 miralax increased to BID -1/30 she declines stronger medication at this time,order sorbitol PRN  20. R shoulder pain  -Xray on 1/15 with postsurgical changes, continue voltaren gel, Kpad    LOS: 11 days A FACE TO FACE EVALUATION WAS PERFORMED  Jennye Boroughs 12/15/2022, 9:22 AM

## 2022-12-15 NOTE — Progress Notes (Signed)
Physical Therapy Session Note  Patient Details  Name: [name] MRN: [id number] Date of Birth: [birth year]  Today's Date: 12/15/2022 PT Individual Time: 1130-1200 PT Individual Time Calculation (min): 30 min   Short Term Goals: Week 1:  PT Short Term Goal 1 (Week 1): pt will be mod assist in slide board transfers PT Short Term Goal 2 (Week 1): pt will be mod assist with bed mobility PT Short Term Goal 3 (Week 1): pt will be maxA x1 w/ sit to stand trasnfer  Skilled Therapeutic Interventions/Progress Updates:    Chart reviewed and pt agreeable to therapy. Pt received seated in WC with no c/o pain. Session focused on functional transfers and standing tolerance to promote return to independent mobility. Pt initiated session with 2 attempted partial stands using MaxA + STEDY. Pt given VC for positioning to improve stand. Pt then completed sit to stand using MaxA + STEDY. In STEDY, pt completed 6 sit to stands with MinA and 30-60sec prolonged standing. Pt also completed lateral weight shifts in STEDY. Pt then lowered to Martha with MaxA. At end of session, pt was left seated in [facility] with alarm engaged, nurse call bell and all needs in reach.     Therapy Documentation Precautions:  Precautions Precautions: Shoulder Type of Shoulder Precautions: R-shoulder pain with ROM/WB Precaution Comments: History of frequent falls at home. Restrictions Weight Bearing Restrictions: No   Therapy/Group: Individual Therapy  Marquette Old, PT, DPT 12/15/2022, 12:08 PM

## 2022-12-15 NOTE — Progress Notes (Signed)
Physical Therapy Session Note  Patient Details  Name: [name] MRN: [id number] Date of Birth: [birth year]  Today's Date: 12/15/2022 PT Individual Time: 1005-1100 PT Individual Time Calculation (min): 55 min   Short Term Goals: Week 1:  PT Short Term Goal 1 (Week 1): pt will be mod assist in slide board transfers PT Short Term Goal 2 (Week 1): pt will be mod assist with bed mobility PT Short Term Goal 3 (Week 1): pt will be maxA x1 w/ sit to stand trasnfer  Skilled Therapeutic Interventions/Progress Updates: Pt presented in w/c agreeable to therapy. Pt states pain controlled currently. Increased with mobility with nsg notified and pain meds received during session. Session focused on standing tolerance in standing frame. Discussed briefly with pt yesterday's events with pt expressing feels more secure in standing frame. Discussed continuing to use standing frame as a place to progress incorporating more dynamics challenges. Pt transported to main gym for energy conservation and set up in standing frame. Pt was able to tolerate x 2 bouts of approx 10 min each with seated rest break between bouts. On first bout pt participated in using "logo" blocks sorting and stacking as distraction to allow time to pass. After approx 7 min PTA slightly loosened strap with pt working on lateral weight shifts as well as powering up from slightly lowered position to full erect stand. Pt was also able to shift L foot forward/backward however due to increased pain in LLE was unable to replicate on RLE. On second bout, pt worked on powering to stand from lower level x 10, standing TKE b/l x 10, and lateral weight shifts with pt able to successfully shift RLE! Prior to returning to w/c PTA lowered sling again and pt was able to complete x 1 stand from approx 45 degrees. Once pt back in w/c pt transported back to room and remained in w/c at end of session with call bell within reach and needs met.       Therapy  Documentation Precautions:  Precautions Precautions: Shoulder Type of Shoulder Precautions: R-shoulder pain with ROM/WB Precaution Comments: History of frequent falls at home. Restrictions Weight Bearing Restrictions: No General:   Vital Signs:   Pain: Pain Assessment Pain Scale: 0-10 Pain Score: 6  Mobility:   Locomotion :    Trunk/Postural Assessment :    Balance:   Exercises:   Other Treatments:      Therapy/Group: Individual Therapy  Pasco Marchitto 12/15/2022, 12:53 PM

## 2022-12-15 NOTE — Progress Notes (Signed)
Physical Therapy Weekly Progress Note  Patient Details  Name: [name] MRN: [id number] Date of Birth: 1951/01/29  Beginning of progress report period: December 05, 2022 End of progress report period: December 16, 2022   Patient has met 2 of 3 short term goals.  Pt has made slow but steady gains during current course of therapy. Pt can be limited by increased anxiety which has limited some progress in ambulation. With increased time pt can perform supine to sit with modA and use of bed features and primarily requires assist for BLE management when transferring to supine. Pt has shown progression of standing tolerance, performing Sit to stand from elevated mat with modA and can perform Sit to stand in [place] with up to light minA. Pre-gait activities have started and pt can tolerate up to 10 min in standing frame performing light weight shifting activities. Pt will benefit from continued CIR level skilled physical therapy to further progress her independence with functional mobility.   Patient continues to demonstrate the following deficits muscle weakness and muscle joint tightness, decreased cardiorespiratoy endurance, and decreased standing balance, decreased postural control, and decreased balance strategies and therefore will continue to benefit from skilled PT intervention to increase functional independence with mobility.  Patient progressing toward long term goals..  Continue plan of care.  PT Short Term Goals Week 1:  PT Short Term Goal 1 (Week 1): pt will be mod assist in slide board transfers PT Short Term Goal 1 - Progress (Week 1): Not met PT Short Term Goal 2 (Week 1): pt will be mod assist with bed mobility PT Short Term Goal 2 - Progress (Week 1): Met PT Short Term Goal 3 (Week 1): pt will be maxA x1 w/ sit to stand trasnfer PT Short Term Goal 3 - Progress (Week 1): Met Week 2:  PT Short Term Goal 1 (Week 2): Pt will perform supine<>sit min assist using bed features PT Short Term  Goal 2 (Week 2): Pt will perform sit<>stands using LRAD with no more than mod assist consistently PT Short Term Goal 3 (Week 2): Pt will perform bed<>chair transfers using LRAD with no more than mod assist PT Short Term Goal 4 (Week 2): Pt will ambulate at least 41ft using litegait for safety but no BWS using LRAD  Skilled Therapeutic Interventions:  Ambulation/gait training;Balance/vestibular training;Discharge planning;Pain management;Skin care/wound management;Therapeutic Activities;UE/LE Coordination activities;UE/LE Strength taining/ROM;Wheelchair propulsion/positioning;Neuromuscular re-education;Functional mobility training;Patient/family education;Cognitive remediation/compensation;Disease management/prevention;Therapeutic Exercise;Splinting/orthotics;Visual/perceptual remediation/compensation;Stair training;Psychosocial support;DME/adaptive equipment instruction;Community reintegration;Functional electrical stimulation   Therapy Documentation Precautions:  Precautions Precautions: Shoulder Type of Shoulder Precautions: R-shoulder pain with ROM/WB Precaution Comments: History of frequent falls at home. Restrictions Weight Bearing Restrictions: No   Bailley Guilford, PT Rosita DeChalus 12/15/2022, 4:46 PM

## 2022-12-16 DIAGNOSIS — M25511 Pain in right shoulder: Secondary | ICD-10-CM

## 2022-12-16 LAB — GLUCOSE, CAPILLARY
Glucose-Capillary: 134 mg/dL — ABNORMAL HIGH (ref 70–99)
Glucose-Capillary: 102 mg/dL — ABNORMAL HIGH (ref 70–99)
Glucose-Capillary: 259 mg/dL — ABNORMAL HIGH (ref 70–99)
Glucose-Capillary: 113 mg/dL — ABNORMAL HIGH (ref 70–99)

## 2022-12-16 MED ORDER — DULOXETINE HCL 20 MG PO CPEP
20.0000 mg | ORAL_CAPSULE | Freq: Every day | ORAL | Status: DC
  Administered 2022-12-16 – 2022-12-18 (×3): 20 mg via ORAL
  Filled 2022-12-16 (×3): qty 1

## 2022-12-16 MED ORDER — INSULIN ASPART 100 UNIT/ML IJ SOLN
2.0000 [IU] | Freq: Three times a day (TID) | INTRAMUSCULAR | Status: DC
  Administered 2022-12-16 – 2022-12-20 (×12): 2 [IU] via SUBCUTANEOUS

## 2022-12-16 NOTE — Progress Notes (Addendum)
PROGRESS NOTE   Subjective/Complaints: No new concerns or complaints. She is happy with her progress with ambulation. Continues to have anxiety but medications are helping.  R knee sore today, she is using heating pad and this is helping.    Review of Systems  Constitutional:  Negative for chills and fever.  Eyes:        Baseline blind R eye  Respiratory:  Negative for shortness of breath.   Cardiovascular:  Negative for chest pain and palpitations.  Gastrointestinal:  Positive for constipation. Negative for abdominal pain, nausea and vomiting.  Genitourinary:  Positive for frequency.  Musculoskeletal:  Positive for joint pain.  Neurological:  Positive for dizziness.  Psychiatric/Behavioral:  The patient is nervous/anxious.     Objective:   No results found. Recent Labs    12/14/22 0557  WBC 6.1  HGB 13.6  HCT 44.1  PLT 307    Recent Labs    12/14/22 0557  NA 136  K 4.1  CL 100  CO2 28  GLUCOSE 122*  BUN 15  CREATININE 0.76  CALCIUM 9.6      Shoulder Xray 11/30/22 Postsurgical changes are noted in the proximal right humerus with deformity identified. Underlying bony thorax appears within normal limits. No fracture or dislocation is seen. Stable bone island is noted in the mid humerus. No new focal abnormality is seen.   IMPRESSION: Postsurgical  changes without acute abnormality.     Intake/Output Summary (Last 24 hours) at 12/16/2022 0833 Last data filed at 12/15/2022 1825 Gross per 24 hour  Intake 354 ml  Output --  Net 354 ml         Physical Exam: Vital Signs Blood pressure (!) 147/74, pulse 73, temperature 97.7 F (36.5 C), resp. rate 16, height 5\' 7"  (1.702 m), weight 89.8 kg, SpO2 98 %.  Physical Exam Constitution: Appropriate appearance for age. No apparent distress +Obese, sitting in wc, heating pad on R knee HEENT: R pupil fixed, dilated, blind; L pupil reactive, EOM grossly  intact, + disconjugate gaze Resp: CTAB. No rales, rhonchi, or wheezing. Cardio: IIR, normal rate Abdomen: Nondistended. Nontender. +bowel sounds. Psych: Appropriate mood and affect. pleasant Skin: BLE with ichthyosis. R foot mepilex dressing in place. Finger bilateral hand flushed appearing. No appreciable LE edema today.      Neurologic/MsK:   Sensory exam: revealed reduced sensation to light touch over right plantar foot and heel. . Motor exam:  Moving all 4 extremities R shoulder tenderness with palpation and Rom in all directions.    Prior Exam      Strength:                RUE: 4/5 SA, 5-/5 EF, 5-/5 EE, 5/5 WE, 5/5 FF, 5/5 FA                 LUE: 5/5 SA, 5/5 EF, 5/5 EE, 5/5 WE, 5/5 FF, 5/5 FA                 RLE: 3+/5 HF, 4+/5 KE, 4/5 DF,  5/5 PF                 LLE:  3-/5  HF, 3+/5 KE, 5/5 DF,  5/5 PF  Coordination: Fine motor coordination was normal.      Assessment/Plan: 1. Functional deficits which require 3+ hours per day of interdisciplinary therapy in a comprehensive inpatient rehab setting. Physiatrist is providing close team supervision and 24 hour management of active medical problems listed below. Physiatrist and rehab team continue to assess barriers to discharge/monitor patient progress toward functional and medical goals  Care Tool:  Bathing    Body parts bathed by patient: Right arm, Chest, Abdomen, Front perineal area, Right upper leg, Left upper leg, Face, Left arm, Buttocks, Left lower leg, Right lower leg   Body parts bathed by helper: Left arm, Buttocks, Right lower leg, Left lower leg     Bathing assist Assist Level: Moderate Assistance - Patient 50 - 74%     Upper Body Dressing/Undressing Upper body dressing   What is the patient wearing?: Pull over shirt    Upper body assist Assist Level: Supervision/Verbal cueing    Lower Body Dressing/Undressing Lower body dressing      What is the patient wearing?: Incontinence brief, Pants      Lower body assist Assist for lower body dressing: 2 Helpers     Toileting Toileting    Toileting assist Assist for toileting: 2 Helpers     Transfers Chair/bed transfer  Transfers assist     Chair/bed transfer assist level: Dependent - mechanical lift     Locomotion Ambulation   Ambulation assist   Ambulation activity did not occur: Safety/medical concerns          Walk 10 feet activity   Assist  Walk 10 feet activity did not occur: Safety/medical concerns        Walk 50 feet activity   Assist Walk 50 feet with 2 turns activity did not occur: Safety/medical concerns         Walk 150 feet activity   Assist Walk 150 feet activity did not occur: Safety/medical concerns         Walk 10 feet on uneven surface  activity   Assist Walk 10 feet on uneven surfaces activity did not occur: Safety/medical concerns         Wheelchair     Assist Is the patient using a wheelchair?: Yes Type of Wheelchair: Manual    Wheelchair assist level: Dependent - Patient 0% Max wheelchair distance: 150    Wheelchair 50 feet with 2 turns activity    Assist        Assist Level: Dependent - Patient 0%   Wheelchair 150 feet activity     Assist      Assist Level: Dependent - Patient 0%   Blood pressure (!) 147/74, pulse 73, temperature 97.7 F (36.5 C), resp. rate 16, height 5\' 7"  (1.702 m), weight 89.8 kg, SpO2 98 %.  Medical Problem List and Plan: 1. Functional deficits secondary to debility due to acute on chronic HFpEF, atrial fibrillation with RVR             -patient may shower             -ELOS/Goals: 10-14 days, Min A/Mod A-gait  -Continue CIR, PT/OT  -Team conference today please see physician documentation under team conference tab, met with team  to discuss problems,progress, and goals. Formulized individual treatment plan based on medical history, underlying problem and comorbidities.    2.   Antithrombotics: -DVT/anticoagulation:  Lovenox 40 mg daily (No full-dose AC d/t Hx SAH 2007)             -  antiplatelet therapy: ASA 325 daily per IM until OP cards f/u 3. Pain Management:  Oxycodone or tramadol prn. Kpad ordered -encourage use of Voltaren gel 2g QID for OA right shoulder/right knee.   4. Mood/Behavior/Sleep: LCSW to follow for evaluation and support.              -Melatonin prn--12/05/22 ordered as scheduled 5mg  QHS, Trazodone PRN   -12/06/22 sleeping improving, continue current regimen -12/12/22 wants melatonin to be changed to PRN, ordered this; advised she will need to request sleep meds if she needs them -12/13/22 improved, cont regimen             -antipsychotic agents: N/A  -Nortriptyline 10mg  QHS, Neurontin 100mg  QHS 5. Neuropsych/cognition: This patient is capable of making decisions on her own behalf.  -start xanax 0.25 prn for anxiety -Consulted Psychiatry- psychiatry reviewed her case and case discussed with  Sheran Fava, psych recommended continuation current medications encourage therapy and did not feel this issue would benefit from inpatient psych, ok to cancel consult.   -1/31 start low dose duloxetine 20mg , discussed risks and benefits of this medication,may also help with joint pain 6. Skin/Wound Care: Add lotion to BLE and hands             -routine pressure relief measures.  7. Fluids/Electrolytes/Nutrition: Monitor I/O. Intake improving. Monitor weekly BMPs starting 12/14/22 -May need to stay dry to avoid fluid overload. 8. Acute on chronic diastolic CHF: Strict I/O w/daily weights. -Cardiac diet. Monitor for signs of overload.  -continue Lasix 40mg  QD, Aldactone 12.5mg  QD, metoprolol 100mg  QD, pravastatin 40mg  QD and Losartan 50mg  QD -monitor for recurrent hyperkalemia (Losartan dose decreased 01/14) -1/31 slightly up, continue to monitor Filed Weights   12/14/22 0448 12/15/22 0548 12/16/22 0500  Weight: 88 kg 88.5 kg 89.8 kg     9. A Fib  w/RVR: Monitor HR TID--continue Cardizem 180mg  QD and Metoprolol 100mg  QD 10. HTN: BPs with some lability, 130s-160s SBP -12/05/22 monitor for now, Losartan decreased 11/29/22, could consider going back to home dosing if appropriate -1/23 BP has been elevated, will increase losartan to 50mg   -1/31 well controlled overall Vitals:   12/12/22 1930 12/13/22 0427 12/13/22 1317 12/13/22 1951  BP: (!) 115/53 (!) 156/75 (!) 110/53 115/63   12/14/22 0310 12/14/22 1024 12/14/22 1340 12/14/22 1946  BP: 123/69 (!) 149/86 (!) 95/57 101/71   12/15/22 0530 12/15/22 1346 12/15/22 2104 12/16/22 0409  BP: 135/66 (!) 107/48 134/62 (!) 147/74     11.  T1DM: Hgb A1c- 7.4. Monitor BS ac/hs and use SSI for elevated BS             -continue insulin glargline 15 units daily.  -May need to add meal coverage-->used 5- 7units tid prn PTA.  -12/05/22 CBGs 170s-200s, will increase Semglee to 16U QD, monitor -1/24 add 2 units mealtime insulin -1/25 increase mealtime to 4 units insulin, decrease lantus to 14 units, glucose has been higher in the day and lower in the Ams -1/26 decrease mealtime insulin to 3 units due to hypoglycemia, suspect this may have been due to poor PO intake yesterday, she refused semglee yesterday- will change to daytime -12/12/22 CBGs 200s overnight, monitor for now since Semglee adjusted yesterday -1/31 trending a little lower, decrease mealtime insulin dose to 2 units  CBG (last 3)  Recent Labs    12/15/22 1637 12/15/22 2059 12/16/22 0549  GLUCAP 91 79 134*      12. Chronic bilateral shoulder and left knee pain:  Oxycodone prn -Refusing voltaren gel for local measures-->encourage use             -Aquathermia for local measures.              -L knee: Follows OP ortho, no benefit from injections in the past  13. Upper respiratory infection, Nocturnal hypoxia: Question sleep apnea.  -Encourage pulmonary hygiene -Respiratory viral panel negative x2 inpatient -Remains with cough,  intermittent SOB - add PRN inhaler, monitor  14.  Pre-renal azotemia: Likely due to diuresis. BUN up but SCr improving.   -Continue to monitor.  -1/29 BUN and Cr WNL  15. Overactive bladder, incontinence: hx of, on Myrbetriq 50mg  QD; now off purewick -12/05/22 having increased frequency and incontinence, ordered U/A, UCx, and scheduled voiding program -12/06/22 U/A c/w UTI, see #16 -Continue myrbetriq,she is mostly continent with occasional incontinent episodes  16. UTI 12/06/22:  -U/A c/w UTI, ordered Macrobid 100mg  BID x5 days (12/06/22>>12/10/22 last dose), UCx pending- Ecoli sensitive to macrobid, completed course, symptoms resolved  17.  Hypokalemia  -12/08/22 x2, recheck tomorrow  -K+ up to 3.6, losartan increased today, Recheck tomorrow -1/25 K+ now elevated hyperkalemia at 5.4, lokelma 5g  x1, stop potassium supplementation, discussed with pharmacy, recheck tomorrow  -1/29 K+ stable at 4.1  18. Dizziness -1/22 Will start meclizine 12.5mg  PRN, check orthostatic VS -She has HX of vertigo going back to 2013, seen by Dr. 2/22 Neurology, felt to be related to vestibulopathy 1/30 fall yesterday but no injury, consider schedule meclizine  19. Constipation: reported as chronic  -Schedule miralax QD, PRN dulcolax supp and fleet enema -1/26 had vomiting with  sorbitol yesterday, continue miralax daily, she had large BM yesterday -1/29 miralax increased to BID -1/30 she declines stronger medication at this time,order sorbitol PRN  20. R shoulder pain  -Xray on 1/15 with postsurgical changes, continue voltaren gel, Kpad  -1/31 Duloxetine started as this may help with joint pain also    LOS: 12 days A FACE TO FACE EVALUATION WAS PERFORMED  2/15 12/16/2022, 8:33 AM

## 2022-12-16 NOTE — Progress Notes (Signed)
Occupational Therapy Session Note  Patient Details  Name: [name] MRN: [id number] Date of Birth: 21-Sep-1951  Today's Date: 12/16/2022 OT Individual Time: 2992-4268 OT Individual Time Calculation (min): 73 min    Short Term Goals: Week 2:  OT Short Term Goal 1 (Week 2): Pt will stand without the stedy with max of one person OT Short Term Goal 2 (Week 2): Pt will complete LB bathing tasks at bed level with min A OT Short Term Goal 3 (Week 2): Pt will stand statically for 5 min in prep for more functional ADL transfers  Skilled Therapeutic Interventions/Progress Updates:    Pt received supine with no c/o pain, agreeable to OT session. She came to EOB with (S) using bed rail. Pt reporting some soreness in BUE from yesterday's functional mobility. She stood in the stedy with min A, more assist needed 2/2 soreness. She was transferred via stedy to the [facility] and she voided some BM and urine. She stood and required max A for toileting tasks. She was transferred back to the w/c following standing in the stedy with min A. She then completed oral care and grooming tasks seated in the w/c. She was assisted in hair care- washing and combing hair at the sink with min A. Completed for hygiene and improving confidence. She completed UB ADLs with (S). She used the reacher to thread pants with (S) and then stood in the stedy with mod A from the w/c (x4 attempts d/t fatigue) and required min A to pull up. She was left sitting up with all needs met.   Pt not doing as well today and feeling down re this. Extensive education provided on muscle soreness and how this is to be expected.   Therapy Documentation Precautions:  Precautions Precautions: Shoulder Type of Shoulder Precautions: R-shoulder pain with ROM/WB Precaution Comments: History of frequent falls at home. Restrictions Weight Bearing Restrictions: No  Therapy/Group: Individual Therapy  Curtis Sites 12/16/2022, 6:20 AM

## 2022-12-16 NOTE — Progress Notes (Signed)
Physical Therapy Session Note  Patient Details  Name: [name] MRN: [id number] Date of Birth: [birth year]  Today's Date: 12/16/2022 PT Individual Time: 1411-1505 PT Individual Time Calculation (min): 54 min   Short Term Goals: Week 2:  PT Short Term Goal 1 (Week 2): Pt will perform supine<>sit min assist using bed features PT Short Term Goal 2 (Week 2): Pt will perform sit<>stands using LRAD with no more than mod assist consistently PT Short Term Goal 3 (Week 2): Pt will perform bed<>chair transfers using LRAD with no more than mod assist PT Short Term Goal 4 (Week 2): Pt will ambulate at least 54ft using litegait for safety but no BWS using LRAD  Skilled Therapeutic Interventions/Progress Updates:  Pt received sitting in w/c with tears running down her eyes, but pt not reporting being upset and is eager to participate in therapy session. Therapist congratulating her on her progress with gait training yesterday and pt agreeable to do it again today.  Transported to/from gym in w/c for time management and energy conservation.  Sit>stand w/c>stedy with max assist for lifting to stand as pt continues to lack adequate anterior trunk weight shift while rising and also has decreased R knee flexion ROM due to prior injury limiting how far her feet can come back beneath her BOS.   Sitting on stedy seat donned litegait harness. Attached to litegait support and used the litegait to assist pt with coming to stand using UE support on litegait handles.  Gait training ~160ft in litegait harness providing partial body weight support (PBWS) with B UE support on litegait handles and therapist managing litegait. - starts with step-to pattern leading with R LE, cued to take smaller R LE steps and larger L LE step lengths to progress to reciprocal stepping pattern, much improved throughout  - pt has severe R knee valgus alignment with likely hip musculature weakness as well but likely bony abnormality - pt  lacks adequate foot clearance bilaterally in swing and maintains slightly wider BOS throughout - noticed towards end of gait pt has feet out in front of her center of gravity causing her to pull backwards on litegait  - had pt turn and walk backwards ~52ft to sit in w/c with pt having significant difficulty achieving positive step length backwards but this is excellent practice for her  Doffed harness as described above.  Sit<>stand transfer training with therapist providing visual demonstration and reinforcement of education on importance of increasing anterior trunk lean/weight shift with pt continuing to require mod assist to rise into standing - x2reps.   Transported back to her room and pt left seated with needs in reach and heat pad on R shoulder for pain management.   Therapy Documentation Precautions:  Precautions Precautions: Shoulder Type of Shoulder Precautions: R-shoulder pain with ROM/WB Precaution Comments: History of frequent falls at home. Restrictions Weight Bearing Restrictions: No   Pain: Reports chronic R knee pain as well as R shoulder pain - provided BWS for pain management of knee and provided heat pad to R shoulder at end of session.    Therapy/Group: Individual Therapy  Tawana Scale , PT, DPT, NCS, CSRS 12/16/2022, 12:59 PM

## 2022-12-16 NOTE — Patient Care Conference (Signed)
Inpatient RehabilitationTeam Conference and Plan of Care Update Date: 12/16/2022   Time: 12:24 PM    Patient Name: [name]      Medical Record Number: [id number]  Date of Birth: 1951/04/06 Sex: Female         Room/Bed: 4M08C/4M08C-01 Payor Info: Payor: [place] / Plan: [employer] MEDICARE / Product Type: *No Product type* /    Admit Date/Time:  12/04/2022  3:43 PM  Primary Diagnosis:  <principal problem not specified>  Hospital Problems: Active Problems:   Essential hypertension   Type 1 diabetes mellitus on insulin therapy ([place])   Primary osteoarthritis of both hands   A-fib ([place])   Chronic diastolic congestive heart failure ([place])   Constipation    Expected Discharge Date: Expected Discharge Date: 01/06/23  Team Members Present: Physician leading conference: Dr. Jennye Boroughs Social Worker Present: Ovidio Kin, LCSW Nurse Present: Dorien Chihuahua, RN PT Present: Jodi Mourning Rafoth, PT;Rosita Dechalus, PTA OT Present: Laverle Hobby, OT PPS Coordinator present : Gunnar Fusi, SLP     Current Status/Progress Goal Weekly Team Focus  Bowel/Bladder   Pt is continent of bowel/bladder   Pt will remain continent of bowel/bladder   Will assess qshift and PRN    Swallow/Nutrition/ Hydration               ADL's   has stood from elevated EOB in the stedy with CGA-min A pretty consistently. Walked 509 ft in the [place] and loved it! Mod A LB ADLs, (S) UB ADLs   min A   ADLs, anxiety management, transfers, standing balance, d/c planning    Mobility   Mod to maxA bed mobility (varies on pain), minA  sit to/from stand in [place], mod A with RW from EOM or EOB. unable to tolerate significant w/c propulsion due to chronic R shoulder issues. Able to tolerate standing frame up to 10 min   min assist w/ bed mobility and transfers, mod assist for gait at least 10-15 ft  transfers, gait, endurance, d/c planning    Communication                Safety/Cognition/  Behavioral Observations               Pain   Pt currently denies pain   Pt will continue to deny pain   Will assess qshift and PRN    Skin   Pt's skin is intact   Pt's skin will remain intact  Will assess qshift and PRN      Discharge Planning:  Pt doing better in therapies managing her anxiety, progressing slowly. Will need to see if husband and daughter can provide min-mod level   Team Discussion: Patient with high anxiety, hx of vertigo and fear of falling , poor endurance, debility, with shoulder soreness/pain (adhesion capsulitis post injury) and limited ROM.   Patient on target to meet rehab goals: Currently able to complete upper body ADLs with supervision and lower body care with mod assist.  Transfers from an elevated surface with min assist. Wheelchair mobility ranges from min - max assist at times.  Able to ambulate >500' with a lite gait and able to stand in a standing frame up to 10 minutes.   *See Care Plan and progress notes for long and short-term goals.   Revisions to Treatment Plan:  Power chair consult  MD adjusting medication for anxiety which will assist with pain management  Teaching Needs: Safety, medications, transfers, toileting, etc.   Current  Barriers to Discharge: Decreased caregiver support  Possible Resolutions to Barriers: Family education DME: recommend stedy for home     Medical Summary Current Status: debility, anxiety, CHF, HTN, DM1, hypokalemia, joint pain  Barriers to Discharge: Electrolyte abnormality;Medical stability  Barriers to Discharge Comments: debility, anxiety, CHF, HTN, DM1, hypokalemia, joint pain, vertigo-chronic Possible Resolutions to [employer] Focus: start duloxetine, monitor BP, monitor CBG   Continued Need for Acute Rehabilitation Level of Care: The patient requires daily medical management by a physician with specialized training in physical medicine and rehabilitation for the following  reasons: Direction of a multidisciplinary physical rehabilitation program to maximize functional independence : Yes Medical management of patient stability for increased activity during participation in an intensive rehabilitation regime.: Yes Analysis of laboratory values and/or radiology reports with any subsequent need for medication adjustment and/or medical intervention. : Yes   I attest that I was present, lead the team conference, and concur with the assessment and plan of the team.   Dorien Chihuahua B 12/16/2022, 4:09 PM

## 2022-12-16 NOTE — Progress Notes (Signed)
Patient ID: [name], female   DOB: 02/27/1951, 72 y.o.   MRN: [id number]  Met with pt to discuss team conference and progress this week. Pt is doing better and her anxiety is better with consistent therapists. Discussed may have a power chair evaluation while here, team is problem solving best option for pt and the safest since falling a lot prior to admission. Will have husband come in closer to target discharge date of 2/21 to learn her care and feel comfortable with it. Pt felt it was long and wants to be at home but understands her need to progress for husband's health issues also. Continue to work on discharge needs.

## 2022-12-16 NOTE — Progress Notes (Signed)
Physical Therapy Session Note  Patient Details  Name: [name] MRN: [id number] Date of Birth: Jul 03, 1951  Today's Date: 12/16/2022 PT Individual Time: 1045-1200 PT Individual Time Calculation (min): 75 min   Short Term Goals: Week 1:  PT Short Term Goal 1 (Week 1): pt will be mod assist in slide board transfers PT Short Term Goal 1 - Progress (Week 1): Not met PT Short Term Goal 2 (Week 1): pt will be mod assist with bed mobility PT Short Term Goal 2 - Progress (Week 1): Met PT Short Term Goal 3 (Week 1): pt will be maxA x1 w/ sit to stand trasnfer PT Short Term Goal 3 - Progress (Week 1): Met Week 2:     Skilled Therapeutic Interventions/Progress Updates: Pt presented in w/c agreeable to therapy. Pt states some soreness but no significant pain from yesterday's activities (gait with OT in Lite gait). Pt transported to day room and participated in Cybex Kinetron 40cm/sec 2 x 2 min bouts. Pt then propelled w/c for 14ft using BLE only for strengthening and general conditioning. Pt required several rest breaks and at times required assisting to extend RLE due to difficulty clearing R foot to advance. Pt transported remaining distance to main rehab gym and set up with standing frame. Participated in x 2 bouts of standing frame each between 8-9 min for increased tolerance in standing. On first bout pt only stood with sling lightly supporting pt while pt and PTA played several rounds of Connect Four. Pt was able to maintain good trunk control throughout. On second bout pt playec x 2 rounds of Connect Four, then with slackened sling performed TKE x 8 BLE, and mini sit to stands x 5. Pt states some soreness in quads but able to tolerate all activities. Pt returned to w/c and was able to scoot posteriorly with feet placed on floor! Pt transported back to room at end of session and remained in w/c with call bell within reach and RN present.      Therapy Documentation Precautions:   Precautions Precautions: Shoulder Type of Shoulder Precautions: R-shoulder pain with ROM/WB Precaution Comments: History of frequent falls at home. Restrictions Weight Bearing Restrictions: No General:   Vital Signs: Therapy Vitals Temp: 97.7 F (36.5 C) Temp Source: Oral Pulse Rate: (!) 57 Resp: 20 BP: 120/73 Patient Position (if appropriate): Lying Oxygen Therapy SpO2: 95 % O2 Device: Room Air Pain:   Mobility:   Locomotion :    Trunk/Postural Assessment :    Balance:   Exercises:   Other Treatments:      Therapy/Group: Individual Therapy  [name] 12/16/2022, 1:27 PM

## 2022-12-17 LAB — GLUCOSE, CAPILLARY
Glucose-Capillary: 54 mg/dL — ABNORMAL LOW (ref 70–99)
Glucose-Capillary: 66 mg/dL — ABNORMAL LOW (ref 70–99)
Glucose-Capillary: 69 mg/dL — ABNORMAL LOW (ref 70–99)
Glucose-Capillary: 159 mg/dL — ABNORMAL HIGH (ref 70–99)
Glucose-Capillary: 290 mg/dL — ABNORMAL HIGH (ref 70–99)
Glucose-Capillary: 176 mg/dL — ABNORMAL HIGH (ref 70–99)
Glucose-Capillary: 58 mg/dL — ABNORMAL LOW (ref 70–99)
Glucose-Capillary: 71 mg/dL (ref 70–99)

## 2022-12-17 MED ORDER — INSULIN GLARGINE-YFGN 100 UNIT/ML ~~LOC~~ SOLN
11.0000 [IU] | Freq: Every day | SUBCUTANEOUS | Status: DC
  Administered 2022-12-18 – 2022-12-21 (×4): 11 [IU] via SUBCUTANEOUS
  Filled 2022-12-17 (×5): qty 0.11

## 2022-12-17 MED ORDER — AMMONIUM LACTATE 12 % EX LOTN
TOPICAL_LOTION | Freq: Two times a day (BID) | CUTANEOUS | Status: DC
  Filled 2022-12-17: qty 225

## 2022-12-17 NOTE — Progress Notes (Signed)
PROGRESS NOTE   Subjective/Complaints: Reports she is happy with her progress with ambulation. She denies any side effects with duloxetine started yesterday.  She had a hypoglycemic event this AM.    Review of Systems  Constitutional:  Negative for chills and fever.  HENT:  Negative for congestion.   Eyes:        Baseline blind R eye  Respiratory:  Negative for shortness of breath.   Cardiovascular:  Negative for chest pain and palpitations.  Gastrointestinal:  Positive for constipation. Negative for abdominal pain, nausea and vomiting.  Genitourinary:  Positive for frequency.  Musculoskeletal:  Positive for joint pain.  Neurological:  Positive for dizziness.  Psychiatric/Behavioral:  The patient is nervous/anxious.     Objective:   No results found. No results for input(s): "WBC", "HGB", "HCT", "PLT" in the last 72 hours.  No results for input(s): "NA", "K", "CL", "CO2", "GLUCOSE", "BUN", "CREATININE", "CALCIUM" in the last 72 hours.    Shoulder Xray 11/30/22 Postsurgical changes are noted in the proximal right humerus with deformity identified. Underlying bony thorax appears within normal limits. No fracture or dislocation is seen. Stable bone island is noted in the mid humerus. No new focal abnormality is seen.   IMPRESSION: Postsurgical  changes without acute abnormality.     Intake/Output Summary (Last 24 hours) at 12/17/2022 1254 Last data filed at 12/17/2022 0842 Gross per 24 hour  Intake 714 ml  Output --  Net 714 ml         Physical Exam: Vital Signs Blood pressure (!) 150/72, pulse 88, temperature (!) 97.4 F (36.3 C), temperature source Oral, resp. rate 18, height 5\' 7"  (1.702 m), weight 89.7 kg, SpO2 94 %.  Physical Exam Constitution: Appropriate appearance for age. No apparent distress sitting in WC HEENT: R pupil fixed, dilated, blind; L pupil reactive, EOM grossly intact, + disconjugate  gaze Resp: CTAB. No rales, rhonchi, or wheezing. Good air movement Cardio: IIR, normal rate Abdomen: Nondistended. Nontender. +bowel sounds. Psych: very pleasant Skin: BLE with ichthyosis. R foot mepilex dressing in place. Finger bilateral hand flushed appearing. No appreciable LE edema today.      Neurologic/MsK:   Sensory exam: revealed reduced sensation to light touch over right plantar foot and heel. . Motor exam:  Moving all 4 extremities to gravity R shoulder tenderness with palpation and Rom in all directions.    Prior Exam      Strength:                RUE: 4/5 SA, 5-/5 EF, 5-/5 EE, 5/5 WE, 5/5 FF, 5/5 FA                 LUE: 5/5 SA, 5/5 EF, 5/5 EE, 5/5 WE, 5/5 FF, 5/5 FA                 RLE: 3+/5 HF, 4+/5 KE, 4/5 DF,  5/5 PF                 LLE:  3-/5 HF, 3+/5 KE, 5/5 DF,  5/5 PF  Coordination: Fine motor coordination was normal.      Assessment/Plan: 1. Functional deficits which require  3+ hours per day of interdisciplinary therapy in a comprehensive inpatient rehab setting. Physiatrist is providing close team supervision and 24 hour management of active medical problems listed below. Physiatrist and rehab team continue to assess barriers to discharge/monitor patient progress toward functional and medical goals  Care Tool:  Bathing    Body parts bathed by patient: Right arm, Chest, Abdomen, Front perineal area, Right upper leg, Left upper leg, Face, Left arm, Buttocks, Left lower leg, Right lower leg   Body parts bathed by helper: Left arm, Buttocks, Right lower leg, Left lower leg     Bathing assist Assist Level: Moderate Assistance - Patient 50 - 74%     Upper Body Dressing/Undressing Upper body dressing   What is the patient wearing?: Pull over shirt    Upper body assist Assist Level: Supervision/Verbal cueing    Lower Body Dressing/Undressing Lower body dressing      What is the patient wearing?: Incontinence brief, Pants     Lower body assist  Assist for lower body dressing: 2 Helpers     Toileting Toileting    Toileting assist Assist for toileting: 2 Helpers     Transfers Chair/bed transfer  Transfers assist     Chair/bed transfer assist level: Dependent - mechanical lift     Locomotion Ambulation   Ambulation assist   Ambulation activity did not occur: Safety/medical concerns          Walk 10 feet activity   Assist  Walk 10 feet activity did not occur: Safety/medical concerns        Walk 50 feet activity   Assist Walk 50 feet with 2 turns activity did not occur: Safety/medical concerns         Walk 150 feet activity   Assist Walk 150 feet activity did not occur: Safety/medical concerns         Walk 10 feet on uneven surface  activity   Assist Walk 10 feet on uneven surfaces activity did not occur: Safety/medical concerns         Wheelchair     Assist Is the patient using a wheelchair?: Yes Type of Wheelchair: Manual    Wheelchair assist level: Dependent - Patient 0% Max wheelchair distance: 150    Wheelchair 50 feet with 2 turns activity    Assist        Assist Level: Dependent - Patient 0%   Wheelchair 150 feet activity     Assist      Assist Level: Dependent - Patient 0%   Blood pressure (!) 150/72, pulse 88, temperature (!) 97.4 F (36.3 C), temperature source Oral, resp. rate 18, height 5\' 7"  (1.702 m), weight 89.7 kg, SpO2 94 %.  Medical Problem List and Plan: 1. Functional deficits secondary to debility due to acute on chronic HFpEF, atrial fibrillation with RVR             -patient may shower             -ELOS/Goals:2/21, min A/mod A for gait  -Continue CIR, PT/OT  2.  Antithrombotics: -DVT/anticoagulation:  Lovenox 40 mg daily (No full-dose AC d/t Hx SAH 2007)             -antiplatelet therapy: ASA 325 daily per IM until OP cards f/u 3. Pain Management:  Oxycodone or tramadol prn. Kpad ordered -encourage use of Voltaren gel 2g QID  for OA right shoulder/right knee.   4. Mood/Behavior/Sleep: LCSW to follow for evaluation and support.              -  Melatonin prn--12/05/22 ordered as scheduled 5mg  QHS, Trazodone PRN   -12/06/22 sleeping improving, continue current regimen -12/12/22 wants melatonin to be changed to PRN, ordered this; advised she will need to request sleep meds if she needs them -12/13/22 improved, cont regimen             -antipsychotic agents: N/A  -Nortriptyline 10mg  QHS, Neurontin 100mg  QHS 5. Neuropsych/cognition: This patient is capable of making decisions on her own behalf.  -start xanax 0.25 prn for anxiety -Consulted Psychiatry- psychiatry reviewed her case and case discussed with  Sheran Fava, psych recommended continuation current medications encourage therapy and did not feel this issue would benefit from inpatient psych, ok to cancel consult.   -1/31 start low dose duloxetine 20mg , discussed risks and benefits of this medication,may also help with joint pain 2/1 consider increase duloxetine to 30mg  daily if she tolerates it without side effects 6. Skin/Wound Care: Add lotion to BLE and hands             -routine pressure relief measures.  7. Fluids/Electrolytes/Nutrition: Monitor I/O. Intake improving. Monitor weekly BMPs starting 12/14/22 -May need to stay dry to avoid fluid overload. 8. Acute on chronic diastolic CHF: Strict I/O w/daily weights. -Cardiac diet. Monitor for signs of overload.  -continue Lasix 40mg  QD, Aldactone 12.5mg  QD, metoprolol 100mg  QD, pravastatin 40mg  QD and Losartan 50mg  QD -monitor for recurrent hyperkalemia (Losartan dose decreased 01/14) -2/1 A little up from a few days ago but appears stable overall Filed Weights   12/15/22 0548 12/16/22 0500 12/17/22 0359  Weight: 88.5 kg 89.8 kg 89.7 kg     9. A Fib w/RVR: Monitor HR TID--continue Cardizem 180mg  QD and Metoprolol 100mg  QD 10. HTN: BPs with some lability, 130s-160s SBP -12/05/22 monitor for now, Losartan  decreased 11/29/22, could consider going back to home dosing if appropriate -1/23 BP has been elevated, will increase losartan to 50mg   -2/1 elevated this am, monitor trend Vitals:   12/14/22 0310 12/14/22 1024 12/14/22 1340 12/14/22 1946  BP: 123/69 (!) 149/86 (!) 95/57 101/71   12/15/22 0530 12/15/22 1346 12/15/22 2104 12/16/22 0409  BP: 135/66 (!) 107/48 134/62 (!) 147/74   12/16/22 1316 12/16/22 2002 12/17/22 0359 12/17/22 0835  BP: 120/73 121/63 (!) 157/73 (!) 150/72     11.  T1DM: Hgb A1c- 7.4. Monitor BS ac/hs and use SSI for elevated BS             -continue insulin glargline 15 units daily.  -May need to add meal coverage-->used 5- 7units tid prn PTA.  -12/05/22 CBGs 170s-200s, will increase Semglee to 16U QD, monitor -1/24 add 2 units mealtime insulin -1/25 increase mealtime to 4 units insulin, decrease lantus to 14 units, glucose has been higher in the day and lower in the Ams -1/26 decrease mealtime insulin to 3 units due to hypoglycemia, suspect this may have been due to poor PO intake yesterday, she refused semglee yesterday- will change to daytime -12/12/22 CBGs 200s overnight, monitor for now since Semglee adjusted yesterday -1/31 trending a little lower, decrease mealtime insulin dose to 2 units -2/1 decrease semglee to 11 units from 14units, recommend nighttime snack tonight   CBG (last 3)  Recent Labs    12/17/22 0706 12/17/22 0828 12/17/22 1213  GLUCAP 66* 159* 290*      12. Chronic bilateral shoulder and left knee pain: Oxycodone prn -Refusing voltaren gel for local measures-->encourage use             -Aquathermia  for local measures.              -L knee: Follows OP ortho, no benefit from injections in the past  13. Upper respiratory infection, Nocturnal hypoxia: Question sleep apnea.  -Encourage pulmonary hygiene -Respiratory viral panel negative x2 inpatient -Remains with cough, intermittent SOB - add PRN inhaler, monitor  14.  Pre-renal azotemia:  Likely due to diuresis. BUN up but SCr improving.   -Continue to monitor.  -1/29 BUN and Cr WNL  15. Overactive bladder, incontinence: hx of, on Myrbetriq 50mg  QD; now off purewick -12/05/22 having increased frequency and incontinence, ordered U/A, UCx, and scheduled voiding program -12/06/22 U/A c/w UTI, see #16 -Continue myrbetriq,she is mostly continent with occasional incontinent episodes  16. UTI 12/06/22:  -U/A c/w UTI, ordered Macrobid 100mg  BID x5 days (12/06/22>>12/10/22 last dose), UCx pending- Ecoli sensitive to macrobid, completed course, symptoms resolved  17.  Hypokalemia  -12/08/22 x2, recheck tomorrow  -K+ up to 3.6, losartan increased today, Recheck tomorrow -1/25 K+ now elevated hyperkalemia at 5.4, lokelma 5g  x1, stop potassium supplementation, discussed with pharmacy, recheck tomorrow  -1/29 K+ stable at 4.1  18. Dizziness -1/22 Will start meclizine 12.5mg  PRN, check orthostatic VS -She has HX of vertigo going back to 2013, seen by Dr. 2/22 Neurology, felt to be related to vestibulopathy 1/30 fall yesterday but no injury, consider schedule meclizine  19. Constipation: reported as chronic  -Schedule miralax QD, PRN dulcolax supp and fleet enema -1/26 had vomiting with  sorbitol yesterday, continue miralax daily, she had large BM yesterday -1/29 miralax increased to BID -1/30 she declines stronger medication at this time,order sorbitol PRN -2/1 large bm yesterday, improved  20. R shoulder pain  -Xray on 1/15 with postsurgical changes, continue voltaren gel, Kpad  -1/31 Duloxetine started as this may help with joint pain also   LOS: 13 days A FACE TO FACE EVALUATION WAS PERFORMED  2/15 12/17/2022, 12:54 PM

## 2022-12-17 NOTE — Progress Notes (Signed)
Hypoglycemic Event  CBG: 69  Treatment: 8 oz juice/soda  Symptoms: None  Follow-up CBG: Time:0706 CBG Result:66  Possible Reasons for Event: Unknown  Comments/MD notified:Yes.  Let pt eat breakfast and then CBG will be rechecked again.    [name]

## 2022-12-17 NOTE — Progress Notes (Incomplete)
Recreational Therapy Session Note  Patient Details  Name: Kaleya Douse MRN: [id number] Date of Birth: [birth year] Today's Date: 12/17/2022  Pain: *** Skilled Therapeutic Interventions/Progress Updates: ***  Therapy/Group: {TR Therapy/Group:3049008}  Activity Level: {TR Activity Level:3049009} Level of assist: {TR Level of Assist:3049010}  Devaney Segers 12/17/2022, 8:41 AM

## 2022-12-17 NOTE — Progress Notes (Signed)
Physical Therapy Session Note  Patient Details  Name: [name] MRN: [id number] Date of Birth: [birth year]  Today's Date: 12/17/2022 PT Individual Time: [phone] and [id number]  PT Individual Time Calculation (min): 70 min and 80 min  Short Term Goals: Week 1:  PT Short Term Goal 1 (Week 1): pt will be mod assist in slide board transfers PT Short Term Goal 1 - Progress (Week 1): Not met PT Short Term Goal 2 (Week 1): pt will be mod assist with bed mobility PT Short Term Goal 2 - Progress (Week 1): Met PT Short Term Goal 3 (Week 1): pt will be maxA x1 w/ sit to stand trasnfer PT Short Term Goal 3 - Progress (Week 1): Met Week 2:  PT Short Term Goal 1 (Week 2): Pt will perform supine<>sit min assist using bed features PT Short Term Goal 2 (Week 2): Pt will perform sit<>stands using LRAD with no more than mod assist consistently PT Short Term Goal 3 (Week 2): Pt will perform bed<>chair transfers using LRAD with no more than mod assist PT Short Term Goal 4 (Week 2): Pt will ambulate at least 30ft using litegait for safety but no BWS using LRAD  Skilled Therapeutic Interventions/Progress Updates: Tx1: Pt presented in bed agreeable to therapy. Pt states pain currently controlled and RN arriving to provide am meds. Pt performed supine to sit with minA for truncal support only and use of bed features. With increased time and use of reacher pt was able to don pants with minA, PTA donned socks and shoes total A for time management. MD arrived for daily assessment while performing task. Pt agreeable to attempt to stand from elevated bed to pull pants over hips. Pt set up with RW and bed elevated. Pt made x 3 attempts to stand however due to increased anxiety pt began to move LLE forward causing increased retropulsion. Pt provided with encouragement and reminded pt of safety features in place (+2 present). On third try pt was able to minimally clear hips however became anxious and began to move LE forward  before powering up to full stand. Pt ultimately completed transfer with [facility] and pt was able to pull self up with minA but required some facilitation at hips to complete shift anteriorly. Pt transferred to w/c and then transported to main gym. Performed Stedy transfer to mat in same manner as prior and was able to raise mat to 22in. Pt then participated in kicking ball with 2/5lb weights on BLE 2 x 15 bilaterally. Pt also participate din hamstring pulls 2 x 10 with red theraband. Pt transferred back to w/c via Stedy and transported back to room. Pt left in w/c at end of session with call bell within reach, current needs met, and Kpack on pt's shoulder.    Tx2: Pt presented in w/c completing lunch agreeable to therapy. Pt denies pain at rest, c/o L knee pain with mobility. Pt transported to main gym and transferred to mat with Stedy. Lisa RT present for initial 45 min of session. Pt stood and set up with Maxi Sky sling to work on Sit to stand from elevated mat with RW. Once set up noted that [facility] battery not working therefore exchanged for [employer] sling. Pt set up with Lite Gait but with RW INSIDE of apparatus. With increased time for explanation and security pt attempted to stand from elevated mat. PTA noted that pt did not move feet when attempting to stand and although could not reach full  TKE pt was able to tolerate most body weight pushing through RW. PTA raised Lite Gait additionally for ambulation. Pt ambulated ~57ft with Lite Gait and RW with PTA assisting RW management. Pt c/o increased L knee pain but requesting to continue to walk. Pt then becoming fearful and noted increased movement of feet. PTA and RT worked on calming techniques (deep breathing, visualizations) to get pt to settle but noted increased extension in upper trunk and pt tending to move feet more forward. PTA did have pt attempt to look in mirror to see body positioning which pt was able to see but was unable to correct. With  increased time pt was ultimately able to take a step back and right self. Pt was then lowered to w/c via Lite Gait. Sling removed with pt leaning forward without difficulty. PTA then had discussion with PT regarding introduction of [place]. Pt currently feels very strongly about wanting to walk again but also understands that she is not progressing in that aspect as well as she is hoping for. Discussed will re-approach the subject next week giving her a few more days to work on standing with RW. Pt then transported to day room and participated in 2 x 13min bouts on Cybex Kinetron at 40cm/sec. Pt transported back to room at end of session and left in w/c with call bell within reach and needs met.       Therapy Documentation Precautions:  Precautions Precautions: Shoulder Type of Shoulder Precautions: R-shoulder pain with ROM/WB Precaution Comments: History of frequent falls at home. Restrictions Weight Bearing Restrictions: No General:   Vital Signs: Therapy Vitals Temp: 97.6 F (36.4 C) Pulse Rate: 71 Resp: 18 BP: (!) 100/55 Patient Position (if appropriate): Sitting Oxygen Therapy SpO2: 97 % O2 Device: Room Air Pain:   Mobility:   Locomotion :    Trunk/Postural Assessment :    Balance:   Exercises:   Other Treatments:      Therapy/Group: Individual Therapy  Mayelin Panos 12/17/2022, 4:20 PM

## 2022-12-17 NOTE — Progress Notes (Signed)
Hypoglycemic Event  CBG: 58  Treatment: 4 oz juice/soda  Symptoms: None  Follow-up CBG: Time:2119 CBG Result:71  Possible Reasons for Event: Unknown  Comments/MD notified:Dan [place], [employer]

## 2022-12-17 NOTE — Progress Notes (Signed)
Hypoglycemic Event  CBG: 54  Treatment: 4 oz juice/soda  Symptoms: None  Follow-up CBG: [id number] CBG Result:69  Possible Reasons for Event: Unknown  Comments/MD notified:Juice given    [name]

## 2022-12-17 NOTE — Progress Notes (Signed)
Occupational Therapy Session Note  Patient Details  Name: Guneet Delpino MRN: [id number] Date of Birth: 1951-02-08  Today's Date: 12/17/2022 OT Individual Time: 1105-1200 OT Individual Time Calculation (min): 55 min    Short Term Goals: Week 2:  OT Short Term Goal 1 (Week 2): Pt will stand without the stedy with max of one person OT Short Term Goal 2 (Week 2): Pt will complete LB bathing tasks at bed level with min A OT Short Term Goal 3 (Week 2): Pt will stand statically for 5 min in prep for more functional ADL transfers  Skilled Therapeutic Interventions/Progress Updates:    Pt received supine with no c/o pain. She was taken to the therapy gym via w/c. She stood from the w/c in the stedy with mod A. She was taken to the EOM. Session focused on sit <> stands form the mat. Problem solved through various issues, with the most problematic being the posterior bias. Utilized several strategies and various levels of assist to reduce posterior bias, R knee varus, and anxiety related stepping forward when coming to stand. Pt able to initiate stand with only min-mod A however once she clears the mat she immediately goes into a strong posterior bias and requires mod-max A +2 to safely come into standing. Great difficulty coming into full extension at the hips. Utilized several external cues to prevent stepping, including a block and a theraband. Pt frequently required max A to position BLE before standing. Pt taken back to her room following and left sitting up with all needs met.   Therapy Documentation Precautions:  Precautions Precautions: Shoulder Type of Shoulder Precautions: R-shoulder pain with ROM/WB Precaution Comments: History of frequent falls at home. Restrictions Weight Bearing Restrictions: No   Therapy/Group: Individual Therapy  Curtis Sites 12/17/2022, 8:28 AM

## 2022-12-18 LAB — GLUCOSE, CAPILLARY
Glucose-Capillary: 265 mg/dL — ABNORMAL HIGH (ref 70–99)
Glucose-Capillary: 268 mg/dL — ABNORMAL HIGH (ref 70–99)
Glucose-Capillary: 89 mg/dL (ref 70–99)
Glucose-Capillary: 148 mg/dL — ABNORMAL HIGH (ref 70–99)

## 2022-12-18 MED ORDER — MECLIZINE HCL 25 MG PO TABS
25.0000 mg | ORAL_TABLET | Freq: Three times a day (TID) | ORAL | Status: DC | PRN
  Administered 2022-12-31 – 2023-01-01 (×2): 25 mg via ORAL
  Filled 2022-12-18 (×2): qty 1

## 2022-12-18 MED ORDER — INSULIN ASPART 100 UNIT/ML IJ SOLN
0.0000 [IU] | Freq: Three times a day (TID) | INTRAMUSCULAR | Status: DC
  Administered 2022-12-19: 8 [IU] via SUBCUTANEOUS
  Administered 2022-12-19: 11 [IU] via SUBCUTANEOUS
  Administered 2022-12-19: 5 [IU] via SUBCUTANEOUS
  Administered 2022-12-20: 3 [IU] via SUBCUTANEOUS
  Administered 2022-12-20: 2 [IU] via SUBCUTANEOUS
  Administered 2022-12-20 – 2022-12-21 (×2): 11 [IU] via SUBCUTANEOUS
  Administered 2022-12-21: 3 [IU] via SUBCUTANEOUS
  Administered 2022-12-22: 2 [IU] via SUBCUTANEOUS
  Administered 2022-12-23: 3 [IU] via SUBCUTANEOUS
  Administered 2022-12-24: 2 [IU] via SUBCUTANEOUS
  Administered 2022-12-24: 8 [IU] via SUBCUTANEOUS
  Administered 2022-12-24: 3 [IU] via SUBCUTANEOUS
  Administered 2022-12-25: 5 [IU] via SUBCUTANEOUS

## 2022-12-18 NOTE — Progress Notes (Signed)
PROGRESS NOTE   Subjective/Complaints: She had a repeat hypoglycemic event last night. No side effects with the duloxetine. She continues to have anxiety in particular when doing new activities with therapy.   Review of Systems  Constitutional:  Negative for chills and fever.  HENT:  Negative for congestion.   Eyes:        Baseline blind R eye  Respiratory:  Negative for shortness of breath.   Cardiovascular:  Negative for chest pain and palpitations.  Gastrointestinal:  Positive for constipation. Negative for abdominal pain, nausea and vomiting.  Genitourinary:  Positive for frequency.  Musculoskeletal:  Positive for joint pain.  Neurological:  Positive for dizziness.  Psychiatric/Behavioral:  The patient is nervous/anxious.     Objective:   No results found. No results for input(s): "WBC", "HGB", "HCT", "PLT" in the last 72 hours.  No results for input(s): "NA", "K", "CL", "CO2", "GLUCOSE", "BUN", "CREATININE", "CALCIUM" in the last 72 hours.    Shoulder Xray 11/30/22 Postsurgical changes are noted in the proximal right humerus with deformity identified. Underlying bony thorax appears within normal limits. No fracture or dislocation is seen. Stable bone island is noted in the mid humerus. No new focal abnormality is seen.   IMPRESSION: Postsurgical  changes without acute abnormality.     Intake/Output Summary (Last 24 hours) at 12/18/2022 0817 Last data filed at 12/17/2022 1820 Gross per 24 hour  Intake 717 ml  Output --  Net 717 ml         Physical Exam: Vital Signs Blood pressure (!) 135/59, pulse 80, temperature 97.9 F (36.6 C), resp. rate 16, height 5\' 7"  (1.702 m), weight 90.3 kg, SpO2 93 %.  Physical Exam Constitution: Appropriate appearance for age. No apparent distress sitting in WC HEENT: R pupil fixed, dilated, blind; L pupil reactive, EOM grossly intact, + disconjugate gaze Resp: CTAB. No  rales, rhonchi, or wheezing. Good air movement Cardio: IIR, normal rate Abdomen: Nondistended. Nontender. +bowel sounds. Psych: very pleasant Skin: BLE with ichthyosis. R foot mepilex dressing in place. Finger bilateral hand flushed appearing. No appreciable LE edema today.      Neurologic/MsK:   Sensory exam: revealed reduced sensation to light touch over right plantar foot and heel. . Motor exam:  Moving all 4 extremities to gravity R shoulder tenderness with palpation and Rom in all directions.   No abnormal tone noted  Prior Exam      Strength:                RUE: 4/5 SA, 5-/5 EF, 5-/5 EE, 5/5 WE, 5/5 FF, 5/5 FA                 LUE: 5/5 SA, 5/5 EF, 5/5 EE, 5/5 WE, 5/5 FF, 5/5 FA                 RLE: 3+/5 HF, 4+/5 KE, 4/5 DF,  5/5 PF                 LLE:  3-/5 HF, 3+/5 KE, 5/5 DF,  5/5 PF  Coordination: Fine motor coordination was normal.      Assessment/Plan: 1. Functional deficits which require  3+ hours per day of interdisciplinary therapy in a comprehensive inpatient rehab setting. Physiatrist is providing close team supervision and 24 hour management of active medical problems listed below. Physiatrist and rehab team continue to assess barriers to discharge/monitor patient progress toward functional and medical goals  Care Tool:  Bathing    Body parts bathed by patient: Right arm, Chest, Abdomen, Front perineal area, Right upper leg, Left upper leg, Face, Left arm, Buttocks, Left lower leg, Right lower leg   Body parts bathed by helper: Left arm, Buttocks, Right lower leg, Left lower leg     Bathing assist Assist Level: Moderate Assistance - Patient 50 - 74%     Upper Body Dressing/Undressing Upper body dressing   What is the patient wearing?: Pull over shirt    Upper body assist Assist Level: Supervision/Verbal cueing    Lower Body Dressing/Undressing Lower body dressing      What is the patient wearing?: Incontinence brief, Pants     Lower body assist  Assist for lower body dressing: 2 Helpers     Toileting Toileting    Toileting assist Assist for toileting: 2 Helpers     Transfers Chair/bed transfer  Transfers assist     Chair/bed transfer assist level: Dependent - mechanical lift     Locomotion Ambulation   Ambulation assist   Ambulation activity did not occur: Safety/medical concerns          Walk 10 feet activity   Assist  Walk 10 feet activity did not occur: Safety/medical concerns        Walk 50 feet activity   Assist Walk 50 feet with 2 turns activity did not occur: Safety/medical concerns         Walk 150 feet activity   Assist Walk 150 feet activity did not occur: Safety/medical concerns         Walk 10 feet on uneven surface  activity   Assist Walk 10 feet on uneven surfaces activity did not occur: Safety/medical concerns         Wheelchair     Assist Is the patient using a wheelchair?: Yes Type of Wheelchair: Manual    Wheelchair assist level: Dependent - Patient 0% Max wheelchair distance: 150    Wheelchair 50 feet with 2 turns activity    Assist        Assist Level: Dependent - Patient 0%   Wheelchair 150 feet activity     Assist      Assist Level: Dependent - Patient 0%   Blood pressure (!) 135/59, pulse 80, temperature 97.9 F (36.6 C), resp. rate 16, height 5\' 7"  (1.702 m), weight 90.3 kg, SpO2 93 %.  Medical Problem List and Plan: 1. Functional deficits secondary to debility due to acute on chronic HFpEF, atrial fibrillation with RVR             -patient may shower             -ELOS/Goals:2/21, min A/mod A for gait  -Continue CIR, PT/OT  2.  Antithrombotics: -DVT/anticoagulation:  Lovenox 40 mg daily (No full-dose AC d/t Hx SAH 2007)             -antiplatelet therapy: ASA 325 daily per IM until OP cards f/u 3. Pain Management:  Oxycodone or tramadol prn. Kpad ordered -encourage use of Voltaren gel 2g QID for OA right shoulder/right  knee.   4. Mood/Behavior/Sleep: LCSW to follow for evaluation and support.              -  Melatonin prn--12/05/22 ordered as scheduled 5mg  QHS, Trazodone PRN   -12/06/22 sleeping improving, continue current regimen -12/12/22 wants melatonin to be changed to PRN, ordered this; advised she will need to request sleep meds if she needs them -12/13/22 improved, cont regimen             -antipsychotic agents: N/A  -Nortriptyline 10mg  QHS, Neurontin 100mg  QHS 5. Neuropsych/cognition: This patient is capable of making decisions on her own behalf.  -start xanax 0.25 prn for anxiety -Consulted Psychiatry- psychiatry reviewed her case and case discussed with  Sheran Fava, psych recommended continuation current medications encourage therapy and did not feel this issue would benefit from inpatient psych, ok to cancel consult.   -1/31 start low dose duloxetine 20mg , discussed risks and benefits of this medication,may also help with joint pain 2/2 increase duloxetine to 30mg  daily 6. Skin/Wound Care: Add lotion to BLE and hands             -routine pressure relief measures.  7. Fluids/Electrolytes/Nutrition: Monitor I/O. Intake improving. Monitor weekly BMPs starting 12/14/22 -May need to stay dry to avoid fluid overload. 8. Acute on chronic diastolic CHF: Strict I/O w/daily weights. -Cardiac diet. Monitor for signs of overload.  -continue Lasix 40mg  QD, Aldactone 12.5mg  QD, metoprolol 100mg  QD, pravastatin 40mg  QD and Losartan 50mg  QD -monitor for recurrent hyperkalemia (Losartan dose decreased 01/14) -2/1 A little up from a few days ago but appears stable overall Filed Weights   12/16/22 0500 12/17/22 0359 12/18/22 0334  Weight: 89.8 kg 89.7 kg 90.3 kg     9. A Fib w/RVR: Monitor HR TID--continue Cardizem 180mg  QD and Metoprolol 100mg  QD 10. HTN: BPs with some lability, 130s-160s SBP -12/05/22 monitor for now, Losartan decreased 11/29/22, could consider going back to home dosing if appropriate -1/23  BP has been elevated, will increase losartan to 50mg   -2/2 controlled overall, monitor Vitals:   12/14/22 1946 12/15/22 0530 12/15/22 1346 12/15/22 2104  BP: 101/71 135/66 (!) 107/48 134/62   12/16/22 0409 12/16/22 1316 12/16/22 2002 12/17/22 0359  BP: (!) 147/74 120/73 121/63 (!) 157/73   12/17/22 0835 12/17/22 1433 12/17/22 1947 12/18/22 0327  BP: (!) 150/72 (!) 100/55 129/71 (!) 135/59     11.  T1DM: Hgb A1c- 7.4. Monitor BS ac/hs and use SSI for elevated BS             -continue insulin glargline 15 units daily.  -May need to add meal coverage-->used 5- 7units tid prn PTA.  -12/05/22 CBGs 170s-200s, will increase Semglee to 16U QD, monitor -1/24 add 2 units mealtime insulin -1/25 increase mealtime to 4 units insulin, decrease lantus to 14 units, glucose has been higher in the day and lower in the Ams -1/26 decrease mealtime insulin to 3 units due to hypoglycemia, suspect this may have been due to poor PO intake yesterday, she refused semglee yesterday- will change to daytime -12/12/22 CBGs 200s overnight, monitor for now since Semglee adjusted yesterday -1/31 trending a little lower, decrease mealtime insulin dose to 2 units -2/1 decrease semglee to 11 units from 14units, recommend nighttime snack tonight  -2/2 decreased to moderate resistance correctional SSI CBG (last 3)  Recent Labs    12/17/22 2044 12/17/22 2119 12/18/22 0646  GLUCAP 58* 71 265*      12. Chronic bilateral shoulder and left knee pain: Oxycodone prn -Refusing voltaren gel for local measures-->encourage use             -Aquathermia for local measures.              -  L knee: Follows OP ortho, no benefit from injections in the past  13. Upper respiratory infection, Nocturnal hypoxia: Question sleep apnea.  -Encourage pulmonary hygiene -Respiratory viral panel negative x2 inpatient -Remains with cough, intermittent SOB - add PRN inhaler, monitor  14.  Pre-renal azotemia: Likely due to diuresis. BUN up but SCr  improving.   -Continue to monitor.  -1/29 BUN and Cr WNL  15. Overactive bladder, incontinence: hx of, on Myrbetriq 50mg  QD; now off purewick -12/05/22 having increased frequency and incontinence, ordered U/A, UCx, and scheduled voiding program -12/06/22 U/A c/w UTI, see #16 -Continue myrbetriq,she is mostly continent with occasional incontinent episodes  16. UTI 12/06/22:  -U/A c/w UTI, ordered Macrobid 100mg  BID x5 days (12/06/22>>12/10/22 last dose), UCx pending- Ecoli sensitive to macrobid, completed course, symptoms resolved  17.  Hypokalemia  -12/08/22 x2, recheck tomorrow  -K+ up to 3.6, losartan increased today, Recheck tomorrow -1/25 K+ now elevated hyperkalemia at 5.4, lokelma 5g  x1, stop potassium supplementation, discussed with pharmacy, recheck tomorrow  -1/29 K+ stable at 4.1  18. Dizziness -1/22 Will start meclizine 12.5mg  PRN, check orthostatic VS -She has HX of vertigo going back to 2013, seen by Dr. 2/22 Neurology, felt to be related to vestibulopathy 1/30 fall yesterday but no injury 2/2 increase meclizine to 25mg  TID prn  19. Constipation: reported as chronic  -Schedule miralax QD, PRN dulcolax supp and fleet enema -1/26 had vomiting with  sorbitol yesterday, continue miralax daily, she had large BM yesterday -1/29 miralax increased to BID -1/30 she declines stronger medication at this time,order sorbitol PRN -2/1 large bm yesterday, improved   20. R shoulder pain  -Xray on 1/15 with postsurgical changes, continue voltaren gel, Kpad  -1/31 Duloxetine started as this may help with joint pain also 2/2 Duloxetine increase to 30mg    LOS: 14 days A FACE TO FACE EVALUATION WAS PERFORMED  2/30 12/18/2022, 8:17 AM

## 2022-12-18 NOTE — Progress Notes (Signed)
Occupational Therapy Session Note  Patient Details  Name: [name] MRN: [id number] Date of Birth: 1951/02/07  Session 1  Today's Date: 12/18/2022 OT Individual Time: 4098-1191 OT Individual Time Calculation (min): 73 min   Session 2  Today's Date: 12/18/2022 OT Individual Time: 1305-1405 OT Individual Time Calculation (min): 60 min    Short Term Goals: Week 2:  OT Short Term Goal 1 (Week 2): Pt will stand without the stedy with max of one person OT Short Term Goal 2 (Week 2): Pt will complete LB bathing tasks at bed level with min A OT Short Term Goal 3 (Week 2): Pt will stand statically for 5 min in prep for more functional ADL transfers  Skilled Therapeutic Interventions/Progress Updates:    Session 1 Pt received supine with no c/o pain, agreeable to OT session. Pt on bedpan. Used this time to address bed mobility and peri hygiene at bed level as this may be an option at home. She rolled to the L with min A, reporting intermittent high pain as she adjusted position on her R shoulder. She required min A at the trunk to stabilize in side lying while she reached around for peri cleansing posteriorly. She required mod A overall. She required mod A to roll to the R d/t R shoulder limitations. She came to EOB with min A following. Pants donned with mod A. She stood in the stedy from elevated EOB with min A and pulled up pants in standing with stedy support. Pt transferred to the w/c. Oral care and UB ADLs with set up assist seated at the sink. Pt was taken via w/c to the therapy gym for time management. She used the [employer] Support to provided increased AROM to the R shoulder. With level 5 assist she was able to bring her shoulder into about 60 degrees of flexion. Mirror placed anteriorly to give visual feedback on reducing trap activation/compensation (severe). Manual release provided to middle traps at several trigger points. Pt returned to her room following. Pt was left sitting up in  the w/c with all needs met and call bell within reach.     Session 2 Pt received sitting with no c/o pain, agreeable to OT session. Pt was taken via w/c to the therapy gym for time management. Skilled +2 from PT to problem solve through functional ADL transfers. Session worked on functional sit <> stands from [employer] using 3 musketeers technique to facilitate hip extension, glute activation, and B foot blocking. Progressed to standing with the RW. Heavy facilitation required at her trunk to bring hips forward. She was able to progress to standing statically with as little as min A from one person with close guarding from the second. Progressed to having BUE on the RW. Two instances of min A only support as pt became more comfortable standing. Overall mod +2 required throughout with heavy facilitation of her pelvis, trunk, and BLE. Entire session required extra time and gentle cueing to reduce anxiety, as well as guided breathing. Extended rest breaks provided between each set. Intended carryover to ADL transfers and toileting tasks at home. Stedy used for mat <> wc transfers with mod A overall. Pt returned to her room and was left sitting up with all needs met.     Therapy Documentation Precautions:  Precautions Precautions: Shoulder Type of Shoulder Precautions: R-shoulder pain with ROM/WB Precaution Comments: History of frequent falls at home. Restrictions Weight Bearing Restrictions: No    Therapy/Group: Individual Therapy  Sanda Klein  Hesper Venturella 12/18/2022, 6:27 AM

## 2022-12-18 NOTE — Progress Notes (Signed)
Physical Therapy Session Note  Patient Details  Name: [name] MRN: [id number] Date of Birth: 20-Apr-1951  Today's Date: 12/18/2022 PT Individual Time: 1050-1205 PT Individual Time Calculation (min): 75 min   Short Term Goals: Week 2:  PT Short Term Goal 1 (Week 2): Pt will perform supine<>sit min assist using bed features PT Short Term Goal 2 (Week 2): Pt will perform sit<>stands using LRAD with no more than mod assist consistently PT Short Term Goal 3 (Week 2): Pt will perform bed<>chair transfers using LRAD with no more than mod assist PT Short Term Goal 4 (Week 2): Pt will ambulate at least 62ft using litegait for safety but no BWS using LRAD  Skilled Therapeutic Interventions/Progress Updates: Pt presented in w/c agreeable to therapy. Pt states pain well managed, premedicated and rest breaks provided as needed. Pt agreeable to continue working on Sit to stands with use of Lite gait. Pt transferred to mat via Stedy requiring modA x 1 with some facilitation to anteriorly weight shift. While perched on Stedy at [employer] harness applied. Pt able to stand in [place] with CGA and harness adjusted and tightened. Pt able to sit at mat with minA for controlled descent. Pt performed a total of 4 stands in Lite Gait with RW. Pt demonstrates excellent forward lean when rocking but tends to push shoulders/upper back into extension when actually initiating stand. Pt provided with demonstration and tactile cues for anterior weight shifting. Pt noted to not shuffle feet in manner that pt performed yesterday. On latter 2 stands PTA facilitating more forward weight shifting at trunk as well as placing mark on Lite Gait post as a cue to have pt lean towards spot with some success. Pt was however able to demosntrate improved posture and improved anterior translation of hips on latter 2 stands as well as have tech slightly slacken harness to allow for increased body weight. Advised to pt an improvement from  previous day and will continue to work on this. Once stands completed pt stood with [place] and harness removed while standing in [place]. Pt transported back to w/c and returned to room. In room pt indicating need for urinary void. Preformed Stand in [place] in same manner as prior and transferred over to [facility]. Pt required total assist for clothing management and minA for controlled descent to [facility]. Pt then handed off to NT with current needs met.      Therapy Documentation Precautions:  Precautions Precautions: Shoulder Type of Shoulder Precautions: R-shoulder pain with ROM/WB Precaution Comments: History of frequent falls at home. Restrictions Weight Bearing Restrictions: No General:   Vital Signs: Therapy Vitals Pulse Rate: 78 BP: 136/62 Pain:   Mobility:   Locomotion :    Trunk/Postural Assessment :    Balance:   Exercises:   Other Treatments:      Therapy/Group: Individual Therapy  Faizah Kandler 12/18/2022, 12:56 PM

## 2022-12-19 LAB — GLUCOSE, CAPILLARY
Glucose-Capillary: 248 mg/dL — ABNORMAL HIGH (ref 70–99)
Glucose-Capillary: 264 mg/dL — ABNORMAL HIGH (ref 70–99)
Glucose-Capillary: 343 mg/dL — ABNORMAL HIGH (ref 70–99)
Glucose-Capillary: 219 mg/dL — ABNORMAL HIGH (ref 70–99)

## 2022-12-19 MED ORDER — DOCUSATE SODIUM 100 MG PO CAPS: 100.0000 mg | ORAL_CAPSULE | Freq: Two times a day (BID) | ORAL | Status: DC | PRN

## 2022-12-19 MED ORDER — DULOXETINE HCL 30 MG PO CPEP
30.0000 mg | ORAL_CAPSULE | Freq: Every day | ORAL | Status: DC
  Administered 2022-12-19 – 2022-12-23 (×5): 30 mg via ORAL
  Filled 2022-12-19 (×5): qty 1

## 2022-12-19 MED ORDER — POLYETHYLENE GLYCOL 3350 17 G PO PACK: 17.0000 g | PACK | Freq: Two times a day (BID) | ORAL | Status: DC | PRN

## 2022-12-19 NOTE — Progress Notes (Signed)
PROGRESS NOTE   Subjective/Complaints: Feeling well today, had a BM this morning, having more frequent and looser stools-- not quite diarrhea, but more than usual. States at home she usually has constipation, but uses miralax/colace PRN, would prefer these be PRN for now-- she's been refusing the miralax lately, would be easier to have PRN. Wants colace PRN in case she decides she needs it.  Otherwise, doing well, no longer having incontinence, pain doing well, sleeping well. Denies any additional complaints.   Review of Systems  Constitutional:  Negative for chills and fever.  HENT:  Negative for congestion.   Eyes:        Baseline blind R eye  Respiratory:  Negative for shortness of breath.   Cardiovascular:  Negative for chest pain and palpitations.  Gastrointestinal:  Negative for abdominal pain, constipation, nausea and vomiting.  Genitourinary:  Positive for frequency (improved).  Musculoskeletal:  Positive for joint pain.  Neurological:  Positive for dizziness.  Psychiatric/Behavioral:  The patient is nervous/anxious.     Objective:   No results found. No results for input(s): "WBC", "HGB", "HCT", "PLT" in the last 72 hours.  No results for input(s): "NA", "K", "CL", "CO2", "GLUCOSE", "BUN", "CREATININE", "CALCIUM" in the last 72 hours.    Shoulder Xray 11/30/22 Postsurgical changes are noted in the proximal right humerus with deformity identified. Underlying bony thorax appears within normal limits. No fracture or dislocation is seen. Stable bone island is noted in the mid humerus. No new focal abnormality is seen.   IMPRESSION: Postsurgical  changes without acute abnormality.     Intake/Output Summary (Last 24 hours) at 12/19/2022 0704 Last data filed at 12/18/2022 1200 Gross per 24 hour  Intake 177 ml  Output --  Net 177 ml         Physical Exam: Vital Signs Blood pressure 131/68, pulse 63, temperature  98.7 F (37.1 C), temperature source Oral, resp. rate 18, height 5\' 7"  (1.702 m), weight 88.2 kg, SpO2 95 %.  Physical Exam Constitution: Appropriate appearance for age. No apparent distress sitting in bed HEENT: R pupil fixed, dilated, blind; L pupil reactive, EOM grossly intact, + disconjugate gaze Resp: CTAB. No rales, rhonchi, or wheezing. Good air movement Cardio: IIR, normal rate Abdomen: Nondistended. Nontender. +bowel sounds. Psych: very pleasant Skin: BLE with ichthyosis. RLE mepilex dressing in place. Finger bilateral hand flushed appearing. No appreciable LE edema today.      Neurologic/MsK:   Sensory exam: revealed reduced sensation to light touch over right plantar foot and heel. . Motor exam: Moving all 4 extremities to gravity R shoulder tenderness with palpation and Rom in all directions.   No abnormal tone noted  Prior Exam      Strength:                RUE: 4/5 SA, 5-/5 EF, 5-/5 EE, 5/5 WE, 5/5 FF, 5/5 FA                 LUE: 5/5 SA, 5/5 EF, 5/5 EE, 5/5 WE, 5/5 FF, 5/5 FA                 RLE: 3+/5 HF, 4+/5 KE,  4/5 DF,  5/5 PF                 LLE:  3-/5 HF, 3+/5 KE, 5/5 DF,  5/5 PF  Coordination: Fine motor coordination was normal.      Assessment/Plan: 1. Functional deficits which require 3+ hours per day of interdisciplinary therapy in a comprehensive inpatient rehab setting. Physiatrist is providing close team supervision and 24 hour management of active medical problems listed below. Physiatrist and rehab team continue to assess barriers to discharge/monitor patient progress toward functional and medical goals  Care Tool:  Bathing    Body parts bathed by patient: Right arm, Chest, Abdomen, Front perineal area, Right upper leg, Left upper leg, Face, Left arm, Buttocks, Left lower leg, Right lower leg   Body parts bathed by helper: Left arm, Buttocks, Right lower leg, Left lower leg     Bathing assist Assist Level: Moderate Assistance - Patient 50 - 74%      Upper Body Dressing/Undressing Upper body dressing   What is the patient wearing?: Pull over shirt    Upper body assist Assist Level: Supervision/Verbal cueing    Lower Body Dressing/Undressing Lower body dressing      What is the patient wearing?: Incontinence brief, Pants     Lower body assist Assist for lower body dressing: 2 Helpers     Toileting Toileting    Toileting assist Assist for toileting: Moderate Assistance - Patient 50 - 74%     Transfers Chair/bed transfer  Transfers assist     Chair/bed transfer assist level: Dependent - mechanical lift     Locomotion Ambulation   Ambulation assist   Ambulation activity did not occur: Safety/medical concerns          Walk 10 feet activity   Assist  Walk 10 feet activity did not occur: Safety/medical concerns        Walk 50 feet activity   Assist Walk 50 feet with 2 turns activity did not occur: Safety/medical concerns         Walk 150 feet activity   Assist Walk 150 feet activity did not occur: Safety/medical concerns         Walk 10 feet on uneven surface  activity   Assist Walk 10 feet on uneven surfaces activity did not occur: Safety/medical concerns         Wheelchair     Assist Is the patient using a wheelchair?: Yes Type of Wheelchair: Manual    Wheelchair assist level: Dependent - Patient 0% Max wheelchair distance: 150    Wheelchair 50 feet with 2 turns activity    Assist        Assist Level: Dependent - Patient 0%   Wheelchair 150 feet activity     Assist      Assist Level: Dependent - Patient 0%   Blood pressure 131/68, pulse 63, temperature 98.7 F (37.1 C), temperature source Oral, resp. rate 18, height 5\' 7"  (1.702 m), weight 88.2 kg, SpO2 95 %.  Medical Problem List and Plan: 1. Functional deficits secondary to debility due to acute on chronic HFpEF, atrial fibrillation with RVR             -patient may shower              -ELOS/Goals: 2/21, min A/mod A for gait  -Continue CIR, PT/OT  2.  Antithrombotics: -DVT/anticoagulation:  Lovenox 40 mg daily (No full-dose AC d/t Hx SAH 2007)             -  antiplatelet therapy: ASA 325 daily per IM until OP cards f/u 3. Pain Management:  Oxycodone or tramadol prn. Kpad ordered -encourage use of Voltaren gel 2g QID for OA right shoulder/right knee.   4. Mood/Behavior/Sleep: LCSW to follow for evaluation and support.              -Melatonin prn--12/05/22 ordered as scheduled 5mg  QHS, Trazodone PRN   -12/06/22 sleeping improving, continue current regimen -12/12/22 wants melatonin to be changed to PRN, ordered this; advised she will need to request sleep meds if she needs them -12/13/22 improved, cont regimen             -antipsychotic agents: N/A  -Nortriptyline 10mg  QHS, Neurontin 100mg  QHS 5. Neuropsych/cognition: This patient is capable of making decisions on her own behalf.  -start xanax 0.25 BID PRN for anxiety -Consulted Psychiatry- psychiatry reviewed her case and case discussed with  Sheran Fava, psych recommended continuation current medications encourage therapy and did not feel this issue would benefit from inpatient psych, ok to cancel consult.   -1/31 start low dose duloxetine 20mg , discussed risks and benefits of this medication,may also help with joint pain -2/2 increase duloxetine to 30mg  daily 6. Skin/Wound Care: Add lotion to BLE and hands             -routine pressure relief measures.  7. Fluids/Electrolytes/Nutrition: Monitor I/O. Intake improving. Monitor weekly BMPs, next 12/21/22 -May need to stay dry to avoid fluid overload. 8. Acute on chronic diastolic CHF: Strict I/O w/daily weights. -Cardiac diet. Monitor for signs of overload.  -continue Lasix 40mg  QD, Aldactone 12.5mg  QD, metoprolol 100mg  QD, pravastatin 40mg  QD and Losartan 50mg  QD -monitor for recurrent hyperkalemia (Losartan dose decreased 01/14) -2/1 A little up from a few days ago but  appears stable overall -12/19/22 stable, monitor Filed Weights   12/17/22 0359 12/18/22 0334 12/19/22 0505  Weight: 89.7 kg 90.3 kg 88.2 kg     9. A Fib w/RVR: Monitor HR TID--continue Cardizem 180mg  QD and Metoprolol 100mg  QD 10. HTN: BPs with some lability, 130s-160s SBP -12/05/22 monitor for now, Losartan decreased 11/29/22, could consider going back to home dosing if appropriate -1/23 BP has been elevated, will increase losartan to 50mg   -12/19/22 controlled overall, monitor Vitals:   12/16/22 0409 12/16/22 1316 12/16/22 2002 12/17/22 0359  BP: (!) 147/74 120/73 121/63 (!) 157/73   12/17/22 0835 12/17/22 1433 12/17/22 1947 12/18/22 0327  BP: (!) 150/72 (!) 100/55 129/71 (!) 135/59   12/18/22 0918 12/18/22 1410 12/18/22 1926 12/19/22 0345  BP: 136/62 107/61 121/65 131/68     11.  T1DM: Hgb A1c- 7.4. Monitor BS ac/hs and use SSI for elevated BS             -continue insulin glargline 15 units daily.  -May need to add meal coverage-->used 5- 7units tid prn PTA.  -12/05/22 CBGs 170s-200s, will increase Semglee to 16U QD, monitor -1/24 add 2 units mealtime insulin -1/25 increase mealtime to 4 units insulin, decrease lantus to 14 units, glucose has been higher in the day and lower in the Ams -1/26 decrease mealtime insulin to 3 units due to hypoglycemia, suspect this may have been due to poor PO intake yesterday, she refused semglee yesterday- will change to daytime -12/12/22 CBGs 200s overnight, monitor for now since Semglee adjusted yesterday -1/31 trending a little lower, decrease mealtime insulin dose to 2 units -2/1 decrease semglee to 11 units from 14units, recommend nighttime snack tonight  -2/2 decreased to moderate resistance correctional  SSI -12/19/22 CBGs widely varying, AM fasting 248, if continuing then could consider retitrating semglee, monitor for now CBG (last 3)  Recent Labs    12/18/22 1621 12/18/22 2043 12/19/22 0547  GLUCAP 89 148* 248*      12. Chronic bilateral  shoulder and left knee pain: Oxycodone prn -Refusing voltaren gel for local measures-->encourage use             -Aquathermia for local measures.              -L knee: Follows OP ortho, no benefit from injections in the past  13. Upper respiratory infection, Nocturnal hypoxia: Question sleep apnea.  -Encourage pulmonary hygiene -Respiratory viral panel negative x2 inpatient -Remains with cough, intermittent SOB - add PRN inhaler, monitor  14.  Pre-renal azotemia: Likely due to diuresis. BUN up but SCr improving.   -Continue to monitor.  -1/29 BUN and Cr WNL, f/up on weekly labs next 12/21/22  15. Overactive bladder, incontinence: hx of, on Myrbetriq 50mg  QD; now off purewick -12/05/22 having increased frequency and incontinence, ordered U/A, UCx, and scheduled voiding program -12/06/22 U/A c/w UTI, see #16 -Continue myrbetriq,she is mostly continent with occasional incontinent episodes -12/19/22 continues to have improvement, cont regimen  16. UTI 12/06/22:  -U/A c/w UTI, ordered Macrobid 100mg  BID x5 days (12/06/22>>12/10/22 last dose), UCx pending- Ecoli sensitive to macrobid, completed course, symptoms resolved  17.  Hypokalemia  -12/08/22 x2, recheck tomorrow  -K+ up to 3.6, losartan increased today, Recheck tomorrow -1/25 K+ now elevated hyperkalemia at 5.4, lokelma 5g  x1, stop potassium supplementation, discussed with pharmacy, recheck tomorrow  -1/29 K+ stable at 4.1, monitor on weekly labs, next 12/21/22  18. Dizziness -1/22 Will start meclizine 12.5mg  PRN, check orthostatic VS -She has HX of vertigo going back to 2013, seen by Dr. 2/22 Neurology, felt to be related to vestibulopathy -1/30 fall yesterday but no injury -2/2 increase meclizine to 25mg  TID prn  19. Constipation: reported as chronic  -Schedule miralax QD, PRN dulcolax supp and fleet enema -1/26 had vomiting with  sorbitol yesterday, continue miralax daily, she had large BM yesterday -1/29 miralax increased to BID -1/30  she declines stronger medication at this time,order sorbitol PRN -2/1 large bm yesterday, improved  -12/19/22 now having increased stool frequency, wants to change Miralax to BID PRN rather than scheduled; also wants Colace 100mg  BID PRN in case she needs it; knows to ask  20. R shoulder pain  -Xray on 1/15 with postsurgical changes, continue voltaren gel, Kpad  -1/31 Duloxetine started as this may help with joint pain also -2/2 Duloxetine increase to 30mg    LOS: 15 days A FACE TO FACE EVALUATION WAS PERFORMED  [address] [name] 12/19/2022, 7:04 AM

## 2022-12-19 NOTE — Progress Notes (Signed)
Occupational Therapy Session Note  Patient Details  Name: [name] MRN: [id number] Date of Birth: Jul 17, 1951  Today's Date: 12/19/2022 OT Individual Time: 4193-7902 OT Individual Time Calculation (min): 45 min    Short Term Goals: Week 2:  OT Short Term Goal 1 (Week 2): Pt will stand without the stedy with max of one person OT Short Term Goal 2 (Week 2): Pt will complete LB bathing tasks at bed level with min A OT Short Term Goal 3 (Week 2): Pt will stand statically for 5 min in prep for more functional ADL transfers   Skilled Therapeutic Interventions/Progress Updates:    Pt semi reclined in bed, requesting to use the toilet.  Pt donned pants at bed level per her request using bridge and rolling techniques with mod assist.  Supine to sit with min assist and cues to bring right shoulder and trunk forward to assist in weight shifting.  Provided visual demo of squat pivot transfer then pt return demonstrated with mod assist +2 EOB to w/c.  Transported to bathroom and completed squat pivot w/c to extra wide bariatric commode over toilet with mod assist +2.  Sit to stand at RW to pull pants and brief down requiring mod assist +2 and cues to prevent posterior lean and pelvic shift.  Changed pants due to leakage of urine while on commode (due to pt shifting pelvis posteriorly and leaning trunk back because of fear of falling). Educated pt on how to shift pelvis anteriorly and benefits of sitting upright to reduce risk of falling and aide in toileting independence.  Completed second sit<>stand in stedy due to time constraints and pt motivated to work on static standing with BUE release needing only CGA.  Transported pt back to w/c at bedside using stedy and required min assist for stand to sit.  Call bell in reach, seat alarm on.    Therapy Documentation Precautions:  Precautions Precautions: Shoulder Type of Shoulder Precautions: R-shoulder pain with ROM/WB Precaution Comments: History of frequent  falls at home. Restrictions Weight Bearing Restrictions: No    Therapy/Group: Individual Therapy  Ezekiel Slocumb 12/19/2022, 6:37 AM

## 2022-12-19 NOTE — Progress Notes (Signed)
Physical Therapy Session Note  Patient Details  Name: [name] MRN: [id number] Date of Birth: 12-22-1950  Today's Date: 12/19/2022 PT Individual Time: 1353-1509 PT Individual Time Calculation (min): 76 min   Short Term Goals: Week 2:  PT Short Term Goal 1 (Week 2): Pt will perform supine<>sit min assist using bed features PT Short Term Goal 2 (Week 2): Pt will perform sit<>stands using LRAD with no more than mod assist consistently PT Short Term Goal 3 (Week 2): Pt will perform bed<>chair transfers using LRAD with no more than mod assist PT Short Term Goal 4 (Week 2): Pt will ambulate at least 67ft using litegait for safety but no BWS using LRAD  Skilled Therapeutic Interventions/Progress Updates:    Pt received sitting in w/c and eager to participate in therapy session. Transported to day room. Sit>stand w/c>stedy with heavy min assist for lifting to stand and continued cuing for increased anterior trunk lean/weight shift. In perched sitting on stedy donned litegait harness. Stedy transfer to [facility].   Connected harness to litegait and performed sit<>stand retraining from slightly elevated EOM to RW while in harness for safety and for pt's sense of security due to increased fear of falling. Performed x3 reps sit<>stand with cuing primarily to continue with increased anterior forward trunk flexion and pt able to come to standing with min assist of 1. Once in standing pt very tense with B UE support on RW and reports feeling like the is falling backwards - therapist facilitated bringing AD forward and cued pt for increased knee extension bilaterally as pt starts to crouch down - 1x pt starts to have minor posterior lean and responds well to cuing to bring head/trunk forward - calming/relaxation cuing provided throughout.  Gait training in [place] with B UE support on litegait handles because feel pt would have too great of a fear of falling attempting gait using RW even if in litegait  harness for safety - totaled 234ft with +2 to assist with managing litegait - after initial ~53ft had to provide seated rest break due to pt having significant fear of falling and believing she is falling backwards - for remainder of gait distance, had to provide significant BWS (body weight support) to achieve full upright posture with her feet underneath her BOS otherwise pt's feet are too far anteriorly causing posterior lean in trunk and pulling backwards on litegait - once upright, pt able to achieve reciprocal stepping pattern frequently taking too great of a step with R LE requiring cuing to correct.  Sit>stand to stedy and removed harness as described above.  Transported back to room and pt reports she gets "frustrated" with her self because she wishes that she could do more and do better - therapist provided emotional support and encouragement that she is continuously making progress and to remain determined but patient. Pt's mood improved slightly. Discussed benefits of visualization and encouraged pt to spend next 10 minutes visualizing herself walking. Pt left with needs in reach and seat belt alarm on.  Therapy Documentation Precautions:  Precautions Precautions: Shoulder Type of Shoulder Precautions: R-shoulder pain with ROM/WB Precaution Comments: History of frequent falls at home. Restrictions Weight Bearing Restrictions: No   Pain: Reports chronic R knee pain but does not limit participation in session.    Therapy/Group: Individual Therapy  Tawana Scale , PT, DPT, NCS, CSRS 12/19/2022, 1:58 PM

## 2022-12-19 NOTE — Plan of Care (Signed)
  Problem: Consults Goal: RH GENERAL PATIENT EDUCATION Description: See Patient Education module for education specifics. Outcome: Progressing   Problem: RH BOWEL ELIMINATION Goal: RH STG MANAGE BOWEL WITH ASSISTANCE Description: STG Manage Bowel with mod I Assistance. Outcome: Progressing Goal: RH STG MANAGE BOWEL W/MEDICATION W/ASSISTANCE Description: STG Manage Bowel with Medication with mod I Assistance. Outcome: Progressing   Problem: RH BLADDER ELIMINATION Goal: RH STG MANAGE BLADDER WITH ASSISTANCE Description: STG Manage Bladder With mod I Assistance Outcome: Progressing Goal: RH STG MANAGE BLADDER WITH MEDICATION WITH ASSISTANCE Description: STG Manage Bladder With Medication With mod I Assistance. Outcome: Progressing   Problem: RH SAFETY Goal: RH STG ADHERE TO SAFETY PRECAUTIONS W/ASSISTANCE/DEVICE Description: STG Adhere to Safety Precautions With cues Assistance/Device. Outcome: Progressing   Problem: RH PAIN MANAGEMENT Goal: RH STG PAIN MANAGED AT OR BELOW PT'S PAIN GOAL Description: < 4 with prns Outcome: Progressing   Problem: RH KNOWLEDGE DEFICIT GENERAL Goal: RH STG INCREASE KNOWLEDGE OF SELF CARE AFTER HOSPITALIZATION Description: Patient and family will be able to manage care at discharge using educational handouts/resources independently Outcome: Progressing   Problem: Education: Goal: Ability to describe self-care measures that may prevent or decrease complications (Diabetes Survival Skills Education) will improve Outcome: Progressing Goal: Individualized Educational Video(s) Outcome: Progressing   Problem: Coping: Goal: Ability to adjust to condition or change in health will improve Outcome: Progressing   Problem: Health Behavior/Discharge Planning: Goal: Ability to identify and utilize available resources and services will improve Outcome: Progressing Goal: Ability to manage health-related needs will improve Outcome: Progressing   Problem:  Fluid Volume: Goal: Ability to maintain a balanced intake and output will improve Outcome: Progressing   Problem: Metabolic: Goal: Ability to maintain appropriate glucose levels will improve Outcome: Progressing   Problem: Nutritional: Goal: Maintenance of adequate nutrition will improve Outcome: Progressing Goal: Progress toward achieving an optimal weight will improve Outcome: Progressing   Problem: Skin Integrity: Goal: Risk for impaired skin integrity will decrease Outcome: Progressing   Problem: Tissue Perfusion: Goal: Adequacy of tissue perfusion will improve Outcome: Progressing

## 2022-12-20 LAB — GLUCOSE, CAPILLARY
Glucose-Capillary: 149 mg/dL — ABNORMAL HIGH (ref 70–99)
Glucose-Capillary: 325 mg/dL — ABNORMAL HIGH (ref 70–99)
Glucose-Capillary: 174 mg/dL — ABNORMAL HIGH (ref 70–99)
Glucose-Capillary: 219 mg/dL — ABNORMAL HIGH (ref 70–99)

## 2022-12-20 MED ORDER — INSULIN ASPART 100 UNIT/ML IJ SOLN
3.0000 [IU] | Freq: Three times a day (TID) | INTRAMUSCULAR | Status: DC
  Administered 2022-12-20 – 2022-12-25 (×12): 3 [IU] via SUBCUTANEOUS

## 2022-12-20 NOTE — Progress Notes (Signed)
PROGRESS NOTE   Subjective/Complaints: No new concerns or complaints reports. Reports she is working hard with therapy.   Review of Systems  Constitutional:  Negative for chills and fever.  HENT:  Negative for congestion.   Eyes:        Baseline blind R eye  Respiratory:  Negative for shortness of breath.   Cardiovascular:  Negative for chest pain and palpitations.  Gastrointestinal:  Negative for abdominal pain, constipation, nausea and vomiting.  Genitourinary:  Positive for frequency (improved).  Musculoskeletal:  Positive for joint pain.  Neurological:  Positive for dizziness.  Psychiatric/Behavioral:  The patient is nervous/anxious.     Objective:   No results found. No results for input(s): "WBC", "HGB", "HCT", "PLT" in the last 72 hours.  No results for input(s): "NA", "K", "CL", "CO2", "GLUCOSE", "BUN", "CREATININE", "CALCIUM" in the last 72 hours.    Shoulder Xray 11/30/22 Postsurgical changes are noted in the proximal right humerus with deformity identified. Underlying bony thorax appears within normal limits. No fracture or dislocation is seen. Stable bone island is noted in the mid humerus. No new focal abnormality is seen.   IMPRESSION: Postsurgical  changes without acute abnormality.     Intake/Output Summary (Last 24 hours) at 12/20/2022 0708 Last data filed at 12/19/2022 2100 Gross per 24 hour  Intake 836 ml  Output --  Net 836 ml         Physical Exam: Vital Signs Blood pressure (!) 122/58, pulse 90, temperature 98.1 F (36.7 C), temperature source Oral, resp. rate 18, height 5\' 7"  (1.702 m), weight 87.8 kg, SpO2 98 %.  Physical Exam Constitution: Appropriate appearance for age. Sitting in bedside chair HEENT: R pupil fixed, dilated, blind; L pupil reactive, EOM grossly intact, + disconjugate gaze Resp: CTAB. No rales, rhonchi, or wheezing. Good air movement Cardio: IIR, normal  rate Abdomen: Nondistended. Nontender. +bowel sounds. Psych: very pleasant Skin: BLE with ichthyosis. RLE mepilex dressing in place. Finger bilateral hand flushed appearing. No appreciable LE edema today.      Neurologic/MsK:   Sensory exam: revealed reduced sensation to light touch over right plantar foot and heel. . Motor exam: Moving all 4 extremities to gravity R knee tenderness, R shoulder tenderness with palpation and Rom in all directions.   No abnormal tone noted  Prior Exam      Strength:                RUE: 4/5 SA, 5-/5 EF, 5-/5 EE, 5/5 WE, 5/5 FF, 5/5 FA                 LUE: 5/5 SA, 5/5 EF, 5/5 EE, 5/5 WE, 5/5 FF, 5/5 FA                 RLE: 3+/5 HF, 4+/5 KE, 4/5 DF,  5/5 PF                 LLE:  3-/5 HF, 3+/5 KE, 5/5 DF,  5/5 PF  Coordination: Fine motor coordination was normal.      Assessment/Plan: 1. Functional deficits which require 3+ hours per day of interdisciplinary therapy in a comprehensive inpatient rehab setting.  Physiatrist is providing close team supervision and 24 hour management of active medical problems listed below. Physiatrist and rehab team continue to assess barriers to discharge/monitor patient progress toward functional and medical goals  Care Tool:  Bathing    Body parts bathed by patient: Right arm, Chest, Abdomen, Front perineal area, Right upper leg, Left upper leg, Face, Left arm, Buttocks, Left lower leg, Right lower leg   Body parts bathed by helper: Left arm, Buttocks, Right lower leg, Left lower leg     Bathing assist Assist Level: Moderate Assistance - Patient 50 - 74%     Upper Body Dressing/Undressing Upper body dressing   What is the patient wearing?: Pull over shirt    Upper body assist Assist Level: Supervision/Verbal cueing    Lower Body Dressing/Undressing Lower body dressing      What is the patient wearing?: Incontinence brief, Pants     Lower body assist Assist for lower body dressing: 2 Helpers      Toileting Toileting    Toileting assist Assist for toileting: Moderate Assistance - Patient 50 - 74%     Transfers Chair/bed transfer  Transfers assist     Chair/bed transfer assist level: Dependent - mechanical lift     Locomotion Ambulation   Ambulation assist   Ambulation activity did not occur: Safety/medical concerns          Walk 10 feet activity   Assist  Walk 10 feet activity did not occur: Safety/medical concerns        Walk 50 feet activity   Assist Walk 50 feet with 2 turns activity did not occur: Safety/medical concerns         Walk 150 feet activity   Assist Walk 150 feet activity did not occur: Safety/medical concerns         Walk 10 feet on uneven surface  activity   Assist Walk 10 feet on uneven surfaces activity did not occur: Safety/medical concerns         Wheelchair     Assist Is the patient using a wheelchair?: Yes Type of Wheelchair: Manual    Wheelchair assist level: Dependent - Patient 0% Max wheelchair distance: 150    Wheelchair 50 feet with 2 turns activity    Assist        Assist Level: Dependent - Patient 0%   Wheelchair 150 feet activity     Assist      Assist Level: Dependent - Patient 0%   Blood pressure (!) 122/58, pulse 90, temperature 98.1 F (36.7 C), temperature source Oral, resp. rate 18, height 5\' 7"  (1.702 m), weight 87.8 kg, SpO2 98 %.  Medical Problem List and Plan: 1. Functional deficits secondary to debility due to acute on chronic HFpEF, atrial fibrillation with RVR             -patient may shower             -ELOS/Goals: 2/21, min A/mod A for gait  -Continue CIR, PT/OT  2.  Antithrombotics: -DVT/anticoagulation:  Lovenox 40 mg daily (No full-dose AC d/t Hx SAH 2007)             -antiplatelet therapy: ASA 325 daily per IM until OP cards f/u 3. Pain Management:  Oxycodone or tramadol prn. Kpad ordered -encourage use of Voltaren gel 2g QID for OA right  shoulder/right knee.   4. Mood/Behavior/Sleep: LCSW to follow for evaluation and support.              -  Melatonin prn--12/05/22 ordered as scheduled 5mg  QHS, Trazodone PRN   -12/06/22 sleeping improving, continue current regimen -12/12/22 wants melatonin to be changed to PRN, ordered this; advised she will need to request sleep meds if she needs them -12/20/22 improved, cont regimen             -antipsychotic agents: N/A  -Nortriptyline 10mg  QHS, Neurontin 100mg  QHS 5. Neuropsych/cognition: This patient is capable of making decisions on her own behalf.  -start xanax 0.25 BID PRN for anxiety -Consulted Psychiatry- psychiatry reviewed her case and case discussed with  Sheran Fava, psych recommended continuation current medications encourage therapy and did not feel this issue would benefit from inpatient psych, ok to cancel consult.   -1/31 start low dose duloxetine 20mg , discussed risks and benefits of this medication,may also help with joint pain -2/2 increase duloxetine to 30mg  daily 6. Skin/Wound Care: Add lotion to BLE and hands             -routine pressure relief measures.  7. Fluids/Electrolytes/Nutrition: Monitor I/O. Intake improving. Monitor weekly BMPs, next 12/21/22 -May need to stay dry to avoid fluid overload. 8. Acute on chronic diastolic CHF: Strict I/O w/daily weights. -Cardiac diet. Monitor for signs of overload.  -continue Lasix 40mg  QD, Aldactone 12.5mg  QD, metoprolol 100mg  QD, pravastatin 40mg  QD and Losartan 50mg  QD -monitor for recurrent hyperkalemia (Losartan dose decreased 01/14) -2/1 A little up from a few days ago but appears stable overall -2/5 stable overall Filed Weights   12/18/22 0334 12/19/22 0505 12/20/22 0428  Weight: 90.3 kg 88.2 kg 87.8 kg     9. A Fib w/RVR: Monitor HR TID--continue Cardizem 180mg  QD and Metoprolol 100mg  QD 10. HTN: BPs with some lability, 130s-160s SBP -12/05/22 monitor for now, Losartan decreased 11/29/22, could consider going back  to home dosing if appropriate -1/23 BP has been elevated, will increase losartan to 50mg   -2/5 stable, continue current regimen Vitals:   12/17/22 0835 12/17/22 1433 12/17/22 1947 12/18/22 0327  BP: (!) 150/72 (!) 100/55 129/71 (!) 135/59   12/18/22 0918 12/18/22 1410 12/18/22 1926 12/19/22 0345  BP: 136/62 107/61 121/65 131/68   12/19/22 0909 12/19/22 1400 12/19/22 1925 12/20/22 0522  BP: (!) 146/62 131/69 (!) 140/77 (!) 122/58     11.  T1DM: Hgb A1c- 7.4. Monitor BS ac/hs and use SSI for elevated BS             -continue insulin glargline 15 units daily.  -May need to add meal coverage-->used 5- 7units tid prn PTA.  -12/05/22 CBGs 170s-200s, will increase Semglee to 16U QD, monitor -1/24 add 2 units mealtime insulin -1/25 increase mealtime to 4 units insulin, decrease lantus to 14 units, glucose has been higher in the day and lower in the Ams -1/26 decrease mealtime insulin to 3 units due to hypoglycemia, suspect this may have been due to poor PO intake yesterday, she refused semglee yesterday- will change to daytime -12/12/22 CBGs 200s overnight, monitor for now since Semglee adjusted yesterday -1/31 trending a little lower, decrease mealtime insulin dose to 2 units -2/1 decrease semglee to 11 units from 14units, recommend nighttime snack tonight  -2/2 decreased to moderate resistance correctional SSI -12/19/22 CBGs widely varying, AM fasting 248, if continuing then could consider retitrating semglee, monitor for now -12/20/22 daytime CBGs yesterday up a bit but AM fasting 149 today, will increase mealtime novolog to 3U again and leave Semglee as is; monitor closely -12/21/22 increase semglee to 13u  CBG (last 3)  Recent  Labs    12/20/22 1653 12/20/22 2114 12/21/22 0555  GLUCAP 174* 219* 172*     12. Chronic bilateral shoulder and left knee pain: Oxycodone prn -Refusing voltaren gel for local measures-->encourage use             -Aquathermia for local measures.              -L knee:  Follows OP ortho, no benefit from injections in the past  13. Upper respiratory infection, Nocturnal hypoxia: Question sleep apnea.  -Encourage pulmonary hygiene -Respiratory viral panel negative x2 inpatient -Remains with cough, intermittent SOB - add PRN inhaler, monitor  14.  Pre-renal azotemia: Likely due to diuresis. BUN up but SCr improving.   -Continue to monitor.  -2/5 Bun and Cr stable  15. Overactive bladder, incontinence: hx of, on Myrbetriq 50mg  QD; now off purewick -12/05/22 having increased frequency and incontinence, ordered U/A, UCx, and scheduled voiding program -12/06/22 U/A c/w UTI, see #16 -Continue myrbetriq,she is mostly continent with occasional incontinent episodes -12/19/22 continues to have improvement, cont regimen  16. UTI 12/06/22:  -U/A c/w UTI, ordered Macrobid 100mg  BID x5 days (12/06/22>>12/10/22 last dose), UCx pending- Ecoli sensitive to macrobid, completed course, symptoms resolved  17.  Hypokalemia  -51meq x2, recheck tomorrow  -K+ up to 3.6, losartan increased today, Recheck tomorrow -1/25 K+ now elevated hyperkalemia at 5.4, lokelma 5g  x1, stop potassium supplementation, discussed with pharmacy, recheck tomorrow  -2/5 K+ 3.6, will supplement with 27meq today  18. Dizziness -1/22 Will start meclizine 12.5mg  PRN, check orthostatic VS -She has HX of vertigo going back to 2013, seen by Dr. Jacelyn Grip [facility]rology, felt to be related to vestibulopathy -1/30 fall yesterday but no injury -2/2 increase meclizine to 25mg  TID prn  19. Constipation: reported as chronic  -Schedule miralax QD, PRN dulcolax supp and fleet enema -1/26 had vomiting with  sorbitol yesterday, continue miralax daily, she had large BM yesterday -1/29 miralax increased to BID -1/30 she declines stronger medication at this time,order sorbitol PRN -2/1 large bm yesterday, improved  -12/19/22 now having increased stool frequency, wants to change Miralax to BID PRN rather than scheduled; also wants  Colace 100mg  BID PRN in case she needs it; knows to ask -12/20/22 stable, monitor on current regimen (PRNs only)  20. R shoulder pain  -Xray on 1/15 with postsurgical changes, continue voltaren gel, Kpad  -1/31 Duloxetine started as this may help with joint pain also -2/2 Duloxetine increase to 30mg    LOS: 16 days A FACE TO [place] 12/20/2022, 7:08 AM

## 2022-12-21 LAB — CBC
HCT: 45.7 % (ref 36.0–46.0)
Hemoglobin: 14.8 g/dL (ref 12.0–15.0)
MCH: 27.3 pg (ref 26.0–34.0)
MCHC: 32.4 g/dL (ref 30.0–36.0)
MCV: 84.3 fL (ref 80.0–100.0)
Platelets: 251 10*3/uL (ref 150–400)
RBC: 5.42 MIL/uL — ABNORMAL HIGH (ref 3.87–5.11)
RDW: 17.1 % — ABNORMAL HIGH (ref 11.5–15.5)
WBC: 5.6 10*3/uL (ref 4.0–10.5)
nRBC: 0 % (ref 0.0–0.2)

## 2022-12-21 LAB — BASIC METABOLIC PANEL
Anion gap: 9 (ref 5–15)
BUN: 15 mg/dL (ref 8–23)
CO2: 28 mmol/L (ref 22–32)
Calcium: 9.3 mg/dL (ref 8.9–10.3)
Chloride: 100 mmol/L (ref 98–111)
Creatinine, Ser: 0.71 mg/dL (ref 0.44–1.00)
GFR, Estimated: >60 mL/min (ref >60)
Glucose, Bld: 174 mg/dL — ABNORMAL HIGH (ref 70–99)
Potassium: 3.6 mmol/L (ref 3.5–5.1)
Sodium: 137 mmol/L (ref 135–145)

## 2022-12-21 LAB — GLUCOSE, CAPILLARY
Glucose-Capillary: 172 mg/dL — ABNORMAL HIGH (ref 70–99)
Glucose-Capillary: 325 mg/dL — ABNORMAL HIGH (ref 70–99)
Glucose-Capillary: 74 mg/dL (ref 70–99)
Glucose-Capillary: 197 mg/dL — ABNORMAL HIGH (ref 70–99)

## 2022-12-21 MED ORDER — POTASSIUM CHLORIDE CRYS ER 10 MEQ PO TBCR
10.0000 meq | EXTENDED_RELEASE_TABLET | Freq: Once | ORAL | Status: AC
  Administered 2022-12-21: 10 meq via ORAL
  Filled 2022-12-21: qty 1

## 2022-12-21 MED ORDER — INSULIN GLARGINE-YFGN 100 UNIT/ML ~~LOC~~ SOLN
13.0000 [IU] | Freq: Every day | SUBCUTANEOUS | Status: DC
  Administered 2022-12-22 – 2022-12-25 (×4): 13 [IU] via SUBCUTANEOUS
  Filled 2022-12-21 (×5): qty 0.13

## 2022-12-21 NOTE — Progress Notes (Signed)
PROGRESS NOTE   Subjective/Complaints: No new concerns or complaints reports. Reports she is working hard with therapy.   Review of Systems  Constitutional:  Negative for chills and fever.  HENT:  Negative for congestion.   Eyes:        Baseline blind R eye  Respiratory:  Negative for shortness of breath.   Cardiovascular:  Negative for chest pain and palpitations.  Gastrointestinal:  Negative for abdominal pain, constipation, nausea and vomiting.  Genitourinary:  Positive for frequency (improved).  Musculoskeletal:  Positive for joint pain.  Neurological:  Positive for dizziness.  Psychiatric/Behavioral:  The patient is nervous/anxious.     Objective:   No results found. Recent Labs    12/21/22 0620  WBC 5.6  HGB 14.8  HCT 45.7  PLT 251    Recent Labs    12/21/22 0620  NA 137  K 3.6  CL 100  CO2 28  GLUCOSE 174*  BUN 15  CREATININE 0.71  CALCIUM 9.3      Shoulder Xray 11/30/22 Postsurgical changes are noted in the proximal right humerus with deformity identified. Underlying bony thorax appears within normal limits. No fracture or dislocation is seen. Stable bone island is noted in the mid humerus. No new focal abnormality is seen.   IMPRESSION: Postsurgical  changes without acute abnormality.     Intake/Output Summary (Last 24 hours) at 12/21/2022 1146 Last data filed at 12/21/2022 0700 Gross per 24 hour  Intake 592 ml  Output --  Net 592 ml         Physical Exam: Vital Signs Blood pressure (!) 147/77, pulse 89, temperature 97.8 F (36.6 C), temperature source Oral, resp. rate 18, height 5\' 7"  (1.702 m), weight 87.3 kg, SpO2 96 %.  Physical Exam Constitution: Appropriate appearance for age. Sitting in bedside chair HEENT: R pupil fixed, dilated, blind; L pupil reactive, EOM grossly intact, + disconjugate gaze Resp: CTAB. No rales, rhonchi, or wheezing. Good air movement Cardio: IIR,  normal rate Abdomen: Nondistended. Nontender. +bowel sounds. Psych: very pleasant Skin: BLE with ichthyosis. RLE mepilex dressing in place. Finger bilateral hand flushed appearing. No appreciable LE edema today.      Neurologic/MsK:   Sensory exam: revealed reduced sensation to light touch over right plantar foot and heel. . Motor exam: Moving all 4 extremities to gravity R knee tenderness, R shoulder tenderness with palpation and Rom in all directions.   No abnormal tone noted  Prior Exam      Strength:                RUE: 4/5 SA, 5-/5 EF, 5-/5 EE, 5/5 WE, 5/5 FF, 5/5 FA                 LUE: 5/5 SA, 5/5 EF, 5/5 EE, 5/5 WE, 5/5 FF, 5/5 FA                 RLE: 3+/5 HF, 4+/5 KE, 4/5 DF,  5/5 PF                 LLE:  3-/5 HF, 3+/5 KE, 5/5 DF,  5/5 PF  Coordination: Fine motor coordination was normal.  Assessment/Plan: 1. Functional deficits which require 3+ hours per day of interdisciplinary therapy in a comprehensive inpatient rehab setting. Physiatrist is providing close team supervision and 24 hour management of active medical problems listed below. Physiatrist and rehab team continue to assess barriers to discharge/monitor patient progress toward functional and medical goals  Care Tool:  Bathing    Body parts bathed by patient: Right arm, Chest, Abdomen, Front perineal area, Right upper leg, Left upper leg, Face, Left arm, Buttocks, Left lower leg, Right lower leg   Body parts bathed by helper: Left arm, Buttocks, Right lower leg, Left lower leg     Bathing assist Assist Level: Contact Guard/Touching assist (shower on BSC)     Upper Body Dressing/Undressing Upper body dressing   What is the patient wearing?: Pull over shirt    Upper body assist Assist Level: Supervision/Verbal cueing    Lower Body Dressing/Undressing Lower body dressing      What is the patient wearing?: Incontinence brief, Pants     Lower body assist Assist for lower body dressing: Moderate  Assistance - Patient 50 - 74% (in the stedy)     [place] for toileting: Moderate Assistance - Patient 50 - 74%     Transfers Chair/bed transfer  Transfers assist     Chair/bed transfer assist level: Dependent - mechanical lift     Locomotion Ambulation   Ambulation assist   Ambulation activity did not occur: Safety/medical concerns          Walk 10 feet activity   Assist  Walk 10 feet activity did not occur: Safety/medical concerns        Walk 50 feet activity   Assist Walk 50 feet with 2 turns activity did not occur: Safety/medical concerns         Walk 150 feet activity   Assist Walk 150 feet activity did not occur: Safety/medical concerns         Walk 10 feet on uneven surface  activity   Assist Walk 10 feet on uneven surfaces activity did not occur: Safety/medical concerns         Wheelchair     Assist Is the patient using a wheelchair?: Yes Type of Wheelchair: Manual    Wheelchair assist level: Dependent - Patient 0% Max wheelchair distance: 150    Wheelchair 50 feet with 2 turns activity    Assist        Assist Level: Dependent - Patient 0%   Wheelchair 150 feet activity     Assist      Assist Level: Dependent - Patient 0%   Blood pressure (!) 147/77, pulse 89, temperature 97.8 F (36.6 C), temperature source Oral, resp. rate 18, height 5\' 7"  (1.702 m), weight 87.3 kg, SpO2 96 %.  Medical Problem List and Plan: 1. Functional deficits secondary to debility due to acute on chronic HFpEF, atrial fibrillation with RVR             -patient may shower             -ELOS/Goals: 2/21, min A/mod A for gait  -Continue CIR, PT/OT  2.  Antithrombotics: -DVT/anticoagulation:  Lovenox 40 mg daily (No full-dose AC d/t Hx SAH 2007)             -antiplatelet therapy: ASA 325 daily per IM until OP cards f/u 3. Pain Management:  Oxycodone or tramadol prn. Kpad ordered -encourage  use of Voltaren gel 2g QID for OA  right shoulder/right knee.   4. Mood/Behavior/Sleep: LCSW to follow for evaluation and support.              -Melatonin prn--12/05/22 ordered as scheduled 5mg  QHS, Trazodone PRN   -12/06/22 sleeping improving, continue current regimen -12/12/22 wants melatonin to be changed to PRN, ordered this; advised she will need to request sleep meds if she needs them -12/20/22 improved, cont regimen             -antipsychotic agents: N/A  -Nortriptyline 10mg  QHS, Neurontin 100mg  QHS 5. Neuropsych/cognition: This patient is capable of making decisions on her own behalf.  -start xanax 0.25 BID PRN for anxiety -Consulted Psychiatry- psychiatry reviewed her case and case discussed with  Sheran Fava, psych recommended continuation current medications encourage therapy and did not feel this issue would benefit from inpatient psych, ok to cancel consult.   -1/31 start low dose duloxetine 20mg , discussed risks and benefits of this medication,may also help with joint pain -2/2 increase duloxetine to 30mg  daily 6. Skin/Wound Care: Add lotion to BLE and hands             -routine pressure relief measures.  7. Fluids/Electrolytes/Nutrition: Monitor I/O. Intake improving. Monitor weekly BMPs, next 12/21/22 -May need to stay dry to avoid fluid overload. 8. Acute on chronic diastolic CHF: Strict I/O w/daily weights. -Cardiac diet. Monitor for signs of overload.  -continue Lasix 40mg  QD, Aldactone 12.5mg  QD, metoprolol 100mg  QD, pravastatin 40mg  QD and Losartan 50mg  QD -monitor for recurrent hyperkalemia (Losartan dose decreased 01/14) -2/1 A little up from a few days ago but appears stable overall -2/5 stable overall Filed Weights   12/19/22 0505 12/20/22 0428 12/21/22 0441  Weight: 88.2 kg 87.8 kg 87.3 kg     9. A Fib w/RVR: Monitor HR TID--continue Cardizem 180mg  QD and Metoprolol 100mg  QD 10. HTN: BPs with some lability, 130s-160s SBP -12/05/22 monitor for now, Losartan  decreased 11/29/22, could consider going back to home dosing if appropriate -1/23 BP has been elevated, will increase losartan to 50mg   -2/5 stable, continue current regimen Vitals:   12/18/22 0918 12/18/22 1410 12/18/22 1926 12/19/22 0345  BP: 136/62 107/61 121/65 131/68   12/19/22 0909 12/19/22 1400 12/19/22 1925 12/20/22 0522  BP: (!) 146/62 131/69 (!) 140/77 (!) 122/58   12/20/22 1522 12/20/22 1955 12/21/22 0100 12/21/22 0556  BP: 108/65 139/79 (!) 149/65 (!) 147/77     11.  T1DM: Hgb A1c- 7.4. Monitor BS ac/hs and use SSI for elevated BS             -continue insulin glargline 15 units daily.  -May need to add meal coverage-->used 5- 7units tid prn PTA.  -12/05/22 CBGs 170s-200s, will increase Semglee to 16U QD, monitor -1/24 add 2 units mealtime insulin -1/25 increase mealtime to 4 units insulin, decrease lantus to 14 units, glucose has been higher in the day and lower in the Ams -1/26 decrease mealtime insulin to 3 units due to hypoglycemia, suspect this may have been due to poor PO intake yesterday, she refused semglee yesterday- will change to daytime -12/12/22 CBGs 200s overnight, monitor for now since Semglee adjusted yesterday -1/31 trending a little lower, decrease mealtime insulin dose to 2 units -2/1 decrease semglee to 11 units from 14units, recommend nighttime snack tonight  -2/2 decreased to moderate resistance correctional SSI -12/19/22 CBGs widely varying, AM fasting 248, if continuing then could consider retitrating semglee, monitor for now -12/20/22 daytime CBGs yesterday up a bit but AM fasting 149  today, will increase mealtime novolog to 3U again and leave Semglee as is; monitor closely -12/21/22 increase semglee to 13u  CBG (last 3)  Recent Labs    12/20/22 1653 12/20/22 2114 12/21/22 0555  GLUCAP 174* 219* 172*      12. Chronic bilateral shoulder and left knee pain: Oxycodone prn -Refusing voltaren gel for local measures-->encourage use             -Aquathermia  for local measures.              -L knee: Follows OP ortho, no benefit from injections in the past  13. Upper respiratory infection, Nocturnal hypoxia: Question sleep apnea.  -Encourage pulmonary hygiene -Respiratory viral panel negative x2 inpatient -Remains with cough, intermittent SOB - add PRN inhaler, monitor  14.  Pre-renal azotemia: Likely due to diuresis. BUN up but SCr improving.   -Continue to monitor.  -2/5 Bun and Cr stable  15. Overactive bladder, incontinence: hx of, on Myrbetriq 50mg  QD; now off purewick -12/05/22 having increased frequency and incontinence, ordered U/A, UCx, and scheduled voiding program -12/06/22 U/A c/w UTI, see #16 -Continue myrbetriq,she is mostly continent with occasional incontinent episodes -12/19/22 continues to have improvement, cont regimen  16. UTI 12/06/22:  -U/A c/w UTI, ordered Macrobid 100mg  BID x5 days (12/06/22>>12/10/22 last dose), UCx pending- Ecoli sensitive to macrobid, completed course, symptoms resolved  17.  Hypokalemia  -12/08/22 x2, recheck tomorrow  -K+ up to 3.6, losartan increased today, Recheck tomorrow -1/25 K+ now elevated hyperkalemia at 5.4, lokelma 5g  x1, stop potassium supplementation, discussed with pharmacy, recheck tomorrow  -2/5 K+ 3.6, will supplement with today  18. Dizziness -1/22 Will start meclizine 12.5mg  PRN, check orthostatic VS -She has HX of vertigo going back to 2013, seen by Dr. 2/22 Neurology, felt to be related to vestibulopathy -1/30 fall yesterday but no injury -2/2 increase meclizine to 25mg  TID prn  19. Constipation: reported as chronic  -Schedule miralax QD, PRN dulcolax supp and fleet enema -1/26 had vomiting with  sorbitol yesterday, continue miralax daily, she had large BM yesterday -1/29 miralax increased to BID -1/30 she declines stronger medication at this time,order sorbitol PRN -2/1 large bm yesterday, improved  -12/19/22 now having increased stool frequency, wants to change Miralax  to BID PRN rather than scheduled; also wants Colace 100mg  BID PRN in case she needs it; knows to ask -12/20/22 stable, monitor on current regimen (PRNs only)  20. R shoulder pain  -Xray on 1/15 with postsurgical changes, continue voltaren gel, Kpad  -1/31 Duloxetine started as this may help with joint pain also -2/2 Duloxetine increase to 30mg    LOS: 17 days A FACE TO FACE EVALUATION WAS PERFORMED  12/21/2022, 11:46 AM

## 2022-12-21 NOTE — Progress Notes (Signed)
Occupational Therapy Weekly Progress Note  Patient Details  Name: [name] MRN: [id number] Date of Birth: 1950/12/26  Beginning of progress report period: December 13, 2022 End of progress report period: December 21, 2022  Today's Date: 12/21/2022 OT Individual Time: 3300-7622 OT Individual Time Calculation (min): 72 min    Patient has met 3 of 3 short term goals.  Karmella continues to slowly progress toward her goals. Her anxiety has improved tremendously with familiar therapists and tasks but she is still limited by it significantly when trying something new. She is able to stand in the stedy with mod A consistently and min A for standing from the perch. She completes UB ADLs with (S), LB with mod A in the stedy and using a reacher. Therapy team is still pushing toward standing goals but at this time a stedy seems to be the likely transfer method at home. Ongoing conversations re goals of care and at the end of this week some more concrete decisions will be made.   Patient continues to demonstrate the following deficits: muscle weakness, decreased cardiorespiratoy endurance, and decreased sitting balance, decreased standing balance, decreased postural control, and decreased balance strategies and therefore will continue to benefit from skilled OT intervention to enhance overall performance with BADL and Reduce care partner burden.  Patient not progressing toward long term goals.  See goal revision..  Plan of care revisions: TBD for now- holding on changing goals to determine realistic plan of care and what type of assist family can provide.  OT Short Term Goals Week 2:  OT Short Term Goal 1 (Week 2): Pt will stand without the stedy with max of one person OT Short Term Goal 1 - Progress (Week 2): Met OT Short Term Goal 2 (Week 2): Pt will complete LB bathing tasks at bed level with min A OT Short Term Goal 2 - Progress (Week 2): Met OT Short Term Goal 3 (Week 2): Pt will stand statically for 5  min in prep for more functional ADL transfers OT Short Term Goal 3 - Progress (Week 2): Met Week 3:  OT Short Term Goal 1 (Week 3): Pt will stand consistently in the stedy with min A OT Short Term Goal 2 (Week 3): Pt will complete bed mobilty rolling R and L with min A OT Short Term Goal 3 (Week 3): Pt will complete LB bathing at bed level with min A  Skilled Therapeutic Interventions/Progress Updates:    Pt received supine with no c/o pain, agreeable to OT session. Pt came to EOB with (S) with use of bed features including HOB elevated and bed rails. Stedy positioned and she stood with CGA from elevated EOB. Transfer to the [facility] via stedy, requiring mod A for clothing management. Pt agreeable to take shower today. Bariatric BSC put in shower facing out. She stood from the [facility] in the stedy with min A. She was transferred into shower via stedy. She was consistently able to stand from perch seat in stedy with CGA. She completed UB bathing seated with (S). She required support under her feet on the commode to assist with postural control and confidence (pt anxious that she feels like she is falling without firmly planted feet). With cueing she was able to complete LB bathing with use of a LH sponge with (S) and instructional assist. She completed hair care with BUE, limited AROM with the RUE. She stood with mod A from the [facility] following. OT assisting with drying LE and posterior legs  and bottom. She was transferred to the w/c following. Shirt donned (S). Pants donned with max A d/t time constraints. Pt completed 1x more sit > stand in the stedy with mod A from the w/c. She completed oral care with (S) seated. She was left sitting up with all need met.   Therapy Documentation Precautions:  Precautions Precautions: Shoulder Type of Shoulder Precautions: R-shoulder pain with ROM/WB Precaution Comments: History of frequent falls at home. Restrictions Weight Bearing Restrictions: No  Therapy/Group:  Individual Therapy  Curtis Sites 12/21/2022, 10:29 AM

## 2022-12-21 NOTE — Progress Notes (Signed)
Physical Therapy Session Note  Patient Details  Name: [name] MRN: [id number] Date of Birth: 1951/08/24  Today's Date: 12/21/2022 PT Individual Time: 1330-1441 PT Individual Time Calculation (min): 71 min   Short Term Goals: Week 2:  PT Short Term Goal 1 (Week 2): Pt will perform supine<>sit min assist using bed features PT Short Term Goal 2 (Week 2): Pt will perform sit<>stands using LRAD with no more than mod assist consistently PT Short Term Goal 3 (Week 2): Pt will perform bed<>chair transfers using LRAD with no more than mod assist PT Short Term Goal 4 (Week 2): Pt will ambulate at least 52ft using litegait for safety but no BWS using LRAD  Skilled Therapeutic Interventions/Progress Updates:      Therapy Documentation Precautions:  Precautions Precautions: Shoulder Type of Shoulder Precautions: R-shoulder pain with ROM/WB Precaution Comments: History of frequent falls at home. Restrictions Weight Bearing Restrictions: No  Pt received seated in w/c at bedside and agreeable to PT session with emphasis on LE strengthening. Pt with unrated right shoulder and left knee pain, provided rest breaks for relief.   Pt transported total A for time management to main gym and required heavy min A for stedy transfer to mat and mod A for sit to lying.   Pt particpated in following exercises to address LE strength and activity tolerance deficits:   -1 x 10 standard gluteal bridges   -4 x 10 red thera band resisted gluteal bridges   - 3 x 8 R LE single leg bridges   -2 x 10 swiss ball hamstring curls/leg press   Pt requires increased time and min A for supine to sit to the right. PT educated pt on importance of being able to perform bed mobility in bilateral directions. Pt transitioned from supine to right S/L with emphasis on L UE contralateral reaching to facilitate protraction 1 x 10. Pt with increased right shoulder pain and provided rest for relief.   Pt returned to room by w/c and  left seated at bedside with all needs in reach.   Therapy/Group: Individual Therapy  Verl Dicker Verl Dicker PT, DPT  12/21/2022, 1:56 PM

## 2022-12-21 NOTE — Progress Notes (Signed)
Physical Therapy Session Note  Patient Details  Name: [name] MRN: [id number] Date of Birth: 1951/03/03  Today's Date: 12/21/2022 PT Individual Time: 1105-1200 PT Individual Time Calculation (min): 55 min   Short Term Goals: Week 2:  PT Short Term Goal 1 (Week 2): Pt will perform supine<>sit min assist using bed features PT Short Term Goal 2 (Week 2): Pt will perform sit<>stands using LRAD with no more than mod assist consistently PT Short Term Goal 3 (Week 2): Pt will perform bed<>chair transfers using LRAD with no more than mod assist PT Short Term Goal 4 (Week 2): Pt will ambulate at least 61ft using litegait for safety but no BWS using LRAD  Skilled Therapeutic Interventions/Progress Updates:     PT cotreats with Katharine Look, OT, for therapy with 2 sets of skilled hands due to pt's mobility needs and significant anxiety, which has impaired progress toward goals.  Pt received seated in WC and agrees to therapy. Reports pain in L>R knee. PT assist to don ace wrap for compression on L knee to provide pain management and pt reporting slight improvement. WC transport to gym for time management.  Pt performs transfer from [facility] to mat table with Stedy. Pt completes sit to stand in [place] with minA and cues for positioning of hips, hand placement, and initiation. Seated rest break prior to additional mobility. Pt performs sit to stand with bariatric RW and modA +2, with much improved sequencing and body mechanics, relative to previous session with this therapist. Pt able to maintain static standing with CGA +2 for safety due to pt's history of anxiety in standing and tendency for heavy posterior lean. Pt progresses to ambulation with modA +2, bariatric RW, and +3 for close WC follow for safety. Pt ambulates bouts of 35', 30', and 65' (!!) feet with extended seated rest breaks in between bouts. At times, pt is able to ambulate with modA from PT and CGA from OT. PT provides tactile cueing at trunk to  prevent posterior lean allow for increased confidence from pt, with good response.   Pt left seated in WC with all needs within reach.   Therapy Documentation Precautions:  Precautions Precautions: Shoulder Type of Shoulder Precautions: R-shoulder pain with ROM/WB Precaution Comments: History of frequent falls at home. Restrictions Weight Bearing Restrictions: No    Therapy/Group: Individual Therapy  Breck Coons, PT, DPT 12/21/2022, 12:03 PM

## 2022-12-22 LAB — GLUCOSE, CAPILLARY
Glucose-Capillary: 143 mg/dL — ABNORMAL HIGH (ref 70–99)
Glucose-Capillary: 103 mg/dL — ABNORMAL HIGH (ref 70–99)
Glucose-Capillary: 113 mg/dL — ABNORMAL HIGH (ref 70–99)
Glucose-Capillary: 205 mg/dL — ABNORMAL HIGH (ref 70–99)

## 2022-12-22 NOTE — Progress Notes (Signed)
Occupational Therapy Session Note  Patient Details  Name: [name] MRN: [id number] Date of Birth: [birth year]  Session 1 Today's Date: 12/22/2022 OT Individual Time: 0263-7858 OT Individual Time Calculation (min): 70 min    Session 2 Today's Date: 12/22/2022 OT Individual Time: 8502-7741 OT Individual Time Calculation (min): 28 min    Session 3 Today's Date: 12/22/2022 OT Individual Time: 2878-6767 OT Individual Time Calculation (min): 30 min  and Today's Date: 12/22/2022 OT Missed Time: 15 Minutes Missed Time Reason:  toileting tasks    Short Term Goals: Week 3:  OT Short Term Goal 1 (Week 3): Pt will stand consistently in the stedy with min A OT Short Term Goal 2 (Week 3): Pt will complete bed mobilty rolling R and L with min A OT Short Term Goal 3 (Week 3): Pt will complete LB bathing at bed level with min A  Skilled Therapeutic Interventions/Progress Updates:    Session 1 Pt received supine with no c/o pain, agreeable to OT session. She came to EOB with (S) using bed features. Discussed home set up and accessibility at length with pt providing measurements and pictures for references. She stood from EOB x4 attempts with posterior bias limiting upright posture and requiring mod A to power up. On the final trial she benefited from OT providing input to her R knee into extension. Pt unaware that bed was soaked in urine. Used the stedy to transfer pt to the bariatric BSC. She was able to stand with min A in the stedy and CGA from the perched seat. Toileting tasks with max A overall. She completed grooming tasks seated at the sink with set up assist. LB dressing completed with use of the reacher, requiring assist only to pull up in standing. Worked on R shoulder AROM using a mobile arm support- facilitation required at the elbow and shoulder to stabilize. She required frequent cueing to reduce middle trap compensation. She was left sitting up with all needs met.    Session 2 Session  focused on myofascial release in pt's posterior neck and R shoulder, with use of heat and AROM/AAROM. Pt was taken via w/c to the therapy gym for time management. Moist heat pack used to heat up both areas for several minutes as preparatory method. Gentle myofascial release performed lateral to her cervical spine with several areas of tension/tightness with improved AROM and improvement in pain reported directly following. Was also able to bring her R shoulder into 50 degrees of flexion and abduction with only minimal pain. Pt returned to her room and was left sitting up with all needs met.    Session 3 Pt missed first 15 min of session d/t toileting tasks with nursing. Pt was taken via w/c to the therapy gym for time management. Skilled co-tx with PT to address functional mobility and standing balance progression. Pt stood from w/c with mod A+2 to power up and then heavy mod-max A +2 to remain standing and complete about 10 ft of functional mobility. She had a lot of difficulty with coming out of posterior bias, heavy cueing for assist to bring pelvis and trunk forward. She became increasingly anxious and required a seated rest break. She completed one more trial, another 10 ft, before needing a rest break. She was very down on herself and required encouragement. Pt returned to her room and was left sitting up with all needs met, husband terry present.      Therapy Documentation Precautions:  Precautions Precautions: Shoulder Type of Shoulder  Precautions: R-shoulder pain with ROM/WB Precaution Comments: History of frequent falls at home. Restrictions Weight Bearing Restrictions: No  Therapy/Group: Individual Therapy  Curtis Sites 12/22/2022, 6:21 AM

## 2022-12-22 NOTE — Progress Notes (Signed)
PROGRESS NOTE   Subjective/Complaints: Reports nursing noted urine was cloudy this AM. She denies dysuria or other urinary symptoms. No additional concerns this AM.   Review of Systems  Constitutional:  Negative for chills, fever and malaise/fatigue.  HENT:  Negative for congestion.   Eyes:        Baseline blind R eye  Respiratory:  Negative for shortness of breath.   Cardiovascular:  Negative for chest pain.  Gastrointestinal:  Negative for abdominal pain, constipation, nausea and vomiting.  Genitourinary:  Negative for dysuria, flank pain, frequency (improved), hematuria and urgency.  Musculoskeletal:  Positive for joint pain.  Skin:  Negative for rash.  Neurological:  Positive for dizziness.  Psychiatric/Behavioral:  The patient is nervous/anxious.     Objective:   No results found. Recent Labs    12/21/22 0620  WBC 5.6  HGB 14.8  HCT 45.7  PLT 251    Recent Labs    12/21/22 0620  NA 137  K 3.6  CL 100  CO2 28  GLUCOSE 174*  BUN 15  CREATININE 0.71  CALCIUM 9.3      Shoulder Xray 11/30/22 Postsurgical changes are noted in the proximal right humerus with deformity identified. Underlying bony thorax appears within normal limits. No fracture or dislocation is seen. Stable bone island is noted in the mid humerus. No new focal abnormality is seen.   IMPRESSION: Postsurgical  changes without acute abnormality.     Intake/Output Summary (Last 24 hours) at 12/22/2022 0833 Last data filed at 12/22/2022 0818 Gross per 24 hour  Intake 417 ml  Output --  Net 417 ml         Physical Exam: Vital Signs Blood pressure 138/70, pulse 92, temperature 97.7 F (36.5 C), temperature source Oral, resp. rate 18, height 5\' 7"  (1.702 m), weight 87.7 kg, SpO2 94 %.  Physical Exam Constitution: Appropriate appearance for age. Sitting in bedside chair HEENT: R pupil fixed, dilated, blind; L pupil reactive, EOM  grossly intact, + disconjugate gaze, MMM Resp: CTAB. No rales, rhonchi, or wheezing. Good air movement Cardio: IIR, normal rate Abdomen: Nondistended. Nontender. +bowel sounds. Psych: very pleasant Skin: BLE with ichthyosis. RLE mepilex dressing in place. Finger bilateral hand flushed appearing. No appreciable LE edema today.      Neurologic/MsK:   Sensory exam: revealed reduced sensation to light touch over right plantar foot and heel. . Motor exam: Moving all 4 extremities to gravity R knee tenderness, R shoulder tenderness with palpation and Rom in all directions.   No abnormal tone noted  Prior Exam      Strength:                RUE: 4/5 SA, 5-/5 EF, 5-/5 EE, 5/5 WE, 5/5 FF, 5/5 FA                 LUE: 5/5 SA, 5/5 EF, 5/5 EE, 5/5 WE, 5/5 FF, 5/5 FA                 RLE: 3+/5 HF, 4+/5 KE, 4/5 DF,  5/5 PF  LLE:  3-/5 HF, 3+/5 KE, 5/5 DF,  5/5 PF  Coordination: Fine motor coordination was normal.      Assessment/Plan: 1. Functional deficits which require 3+ hours per day of interdisciplinary therapy in a comprehensive inpatient rehab setting. Physiatrist is providing close team supervision and 24 hour management of active medical problems listed below. Physiatrist and rehab team continue to assess barriers to discharge/monitor patient progress toward functional and medical goals  Care Tool:  Bathing    Body parts bathed by patient: Right arm, Chest, Abdomen, Front perineal area, Right upper leg, Left upper leg, Face, Left arm, Buttocks, Left lower leg, Right lower leg   Body parts bathed by helper: Left arm, Buttocks, Right lower leg, Left lower leg     Bathing assist Assist Level: Contact Guard/Touching assist (shower on BSC)     Upper Body Dressing/Undressing Upper body dressing   What is the patient wearing?: Pull over shirt    Upper body assist Assist Level: Supervision/Verbal cueing    Lower Body Dressing/Undressing Lower body dressing      What  is the patient wearing?: Incontinence brief, Pants     Lower body assist Assist for lower body dressing: Moderate Assistance - Patient 50 - 74% (in the stedy)     Toileting Toileting    Toileting assist Assist for toileting: Moderate Assistance - Patient 50 - 74%     Transfers Chair/bed transfer  Transfers assist     Chair/bed transfer assist level: Dependent - mechanical lift     Locomotion Ambulation   Ambulation assist   Ambulation activity did not occur: Safety/medical concerns          Walk 10 feet activity   Assist  Walk 10 feet activity did not occur: Safety/medical concerns        Walk 50 feet activity   Assist Walk 50 feet with 2 turns activity did not occur: Safety/medical concerns         Walk 150 feet activity   Assist Walk 150 feet activity did not occur: Safety/medical concerns         Walk 10 feet on uneven surface  activity   Assist Walk 10 feet on uneven surfaces activity did not occur: Safety/medical concerns         Wheelchair     Assist Is the patient using a wheelchair?: Yes Type of Wheelchair: Manual    Wheelchair assist level: Dependent - Patient 0% Max wheelchair distance: 150    Wheelchair 50 feet with 2 turns activity    Assist        Assist Level: Dependent - Patient 0%   Wheelchair 150 feet activity     Assist      Assist Level: Dependent - Patient 0%   Blood pressure 138/70, pulse 92, temperature 97.7 F (36.5 C), temperature source Oral, resp. rate 18, height 5\' 7"  (1.702 m), weight 87.7 kg, SpO2 94 %.  Medical Problem List and Plan: 1. Functional deficits secondary to debility due to acute on chronic HFpEF, atrial fibrillation with RVR             -patient may shower             -ELOS/Goals: 2/21, min A/mod A for gait  -Continue CIR, PT/OT  -Ambulated 65 feet yesterday  -Team conference tomorrow  2.  Antithrombotics: -DVT/anticoagulation:  Lovenox 40 mg daily (No  full-dose AC d/t Hx SAH 2007)             -  antiplatelet therapy: ASA 325 daily per IM until OP cards f/u 3. Pain Management:  Oxycodone or tramadol prn. Kpad ordered -encourage use of Voltaren gel 2g QID for OA right shoulder/right knee.   4. Mood/Behavior/Sleep: LCSW to follow for evaluation and support.              -Melatonin prn--12/05/22 ordered as scheduled 5mg  QHS, Trazodone PRN   -12/06/22 sleeping improving, continue current regimen -12/12/22 wants melatonin to be changed to PRN, ordered this; advised she will need to request sleep meds if she needs them -12/20/22 improved, cont regimen             -antipsychotic agents: N/A  -Nortriptyline 10mg  QHS, Neurontin 100mg  QHS 5. Neuropsych/cognition: This patient is capable of making decisions on her own behalf.  -start xanax 0.25 BID PRN for anxiety -Consulted Psychiatry- psychiatry reviewed her case and case discussed with  Sheran Fava, psych recommended continuation current medications encourage therapy and did not feel this issue would benefit from inpatient psych, ok to cancel consult.   -1/31 start low dose duloxetine 20mg , discussed risks and benefits of this medication,may also help with joint pain -2/2 increase duloxetine to 30mg  daily -2/6Consider increase duloxetine to 60 MG later this week 6. Skin/Wound Care: Add lotion to BLE and hands             -routine pressure relief measures.  7. Fluids/Electrolytes/Nutrition: Monitor I/O. Intake improving. Monitor weekly BMPs, next 12/21/22 -May need to stay dry to avoid fluid overload. 8. Acute on chronic diastolic CHF: Strict I/O w/daily weights. -Cardiac diet. Monitor for signs of overload.  -continue Lasix 40mg  QD, Aldactone 12.5mg  QD, metoprolol 100mg  QD, pravastatin 40mg  QD and Losartan 50mg  QD -monitor for recurrent hyperkalemia (Losartan dose decreased 01/14) -2/1 A little up from a few days ago but appears stable overall -2/6 wt stable Filed Weights   12/20/22 0428  12/21/22 0441 12/22/22 0320  Weight: 87.8 kg 87.3 kg 87.7 kg     9. A Fib w/RVR: Monitor HR TID--continue Cardizem 180mg  QD and Metoprolol 100mg  QD 10. HTN: BPs with some lability, 130s-160s SBP -12/05/22 monitor for now, Losartan decreased 11/29/22, could consider going back to home dosing if appropriate -1/23 BP has been elevated, will increase losartan to 50mg   -2/6 well controlled Vitals:   12/18/22 1926 12/19/22 0345 12/19/22 0909 12/19/22 1400  BP: 121/65 131/68 (!) 146/62 131/69   12/19/22 1925 12/20/22 0522 12/20/22 1522 12/20/22 1955  BP: (!) 140/77 (!) 122/58 108/65 139/79   12/21/22 0100 12/21/22 0556 12/21/22 1956 12/22/22 0310  BP: (!) 149/65 (!) 147/77 128/74 138/70     11.  T1DM: Hgb A1c- 7.4. Monitor BS ac/hs and use SSI for elevated BS             -continue insulin glargline 15 units daily.  -May need to add meal coverage-->used 5- 7units tid prn PTA.  -12/05/22 CBGs 170s-200s, will increase Semglee to 16U QD, monitor -1/24 add 2 units mealtime insulin -1/25 increase mealtime to 4 units insulin, decrease lantus to 14 units, glucose has been higher in the day and lower in the Ams -1/26 decrease mealtime insulin to 3 units due to hypoglycemia, suspect this may have been due to poor PO intake yesterday, she refused semglee yesterday- will change to daytime -12/12/22 CBGs 200s overnight, monitor for now since Semglee adjusted yesterday -1/31 trending a little lower, decrease mealtime insulin dose to 2 units -2/1 decrease semglee to 11 units from 14units, recommend nighttime snack  tonight  -2/2 decreased to moderate resistance correctional SSI -12/19/22 CBGs widely varying, AM fasting 248, if continuing then could consider retitrating semglee, monitor for now -12/20/22 daytime CBGs yesterday up a bit but AM fasting 149 today, will increase mealtime novolog to 3U again and leave Semglee as is; monitor closely -12/21/22 increase semglee to 13u -12/22/22 CBGS appears improved, monitor  response to change in insulin therapy yesterday  CBG (last 3)  Recent Labs    12/21/22 1647 12/21/22 2055 12/22/22 0605  GLUCAP 74 197* 143*      12. Chronic bilateral shoulder and left knee pain: Oxycodone prn -Refusing voltaren gel for local measures-->encourage use             -Aquathermia for local measures.              -L knee: Follows OP ortho, no benefit from injections in the past  13. Upper respiratory infection, Nocturnal hypoxia: Question sleep apnea.  -Encourage pulmonary hygiene -Respiratory viral panel negative x2 inpatient -Remains with cough, intermittent SOB - add PRN inhaler, monitor  14.  Pre-renal azotemia: Likely due to diuresis. BUN up but SCr improving.   -Continue to monitor.  -2/5 Bun and Cr stable  15. Overactive bladder, incontinence: hx of, on Myrbetriq 50mg  QD; now off purewick -12/05/22 having increased frequency and incontinence, ordered U/A, UCx, and scheduled voiding program -12/06/22 U/A c/w UTI, see #16 -Continue myrbetriq,she is mostly continent with occasional incontinent episodes -12/19/22 continues to have improvement, cont regimen  16. UTI 12/06/22:  -U/A c/w UTI, ordered Macrobid 100mg  BID x5 days (12/06/22>>12/10/22 last dose), UCx pending- Ecoli sensitive to macrobid, completed course, symptoms resolved -1/6 urine cloudy but not symptoms today, continue to monitor  17.  Hypokalemia  -19meq x2, recheck tomorrow  -K+ up to 3.6, losartan increased today, Recheck tomorrow -1/25 K+ now elevated hyperkalemia at 5.4, lokelma 5g  x1, stop potassium supplementation, discussed with pharmacy, recheck tomorrow  -2/5 K+ 3.6, will supplement with 43meq today  18. Dizziness -1/22 Will start meclizine 12.5mg  PRN, check orthostatic VS -She has HX of vertigo going back to 2013, seen by Dr. Jacelyn Grip [facility]rology, felt to be related to vestibulopathy -1/30 fall yesterday but no injury -2/2 increase meclizine to 25mg  TID prn  19. Constipation: reported as  chronic  -Schedule miralax QD, PRN dulcolax supp and fleet enema -1/26 had vomiting with  sorbitol yesterday, continue miralax daily, she had large BM yesterday -1/29 miralax increased to BID -1/30 she declines stronger medication at this time,order sorbitol PRN -2/1 large bm yesterday, improved  -12/19/22 now having increased stool frequency, wants to change Miralax to BID PRN rather than scheduled; also wants Colace 100mg  BID PRN in case she needs it; knows to ask -12/20/22 stable, monitor on current regimen (PRNs only)  20. R shoulder pain  -Xray on 1/15 with postsurgical changes, continue voltaren gel, Kpad  -1/31 Duloxetine started as this may help with joint pain also -2/2 Duloxetine increase to 30mg  -2/6 consider increase duloxetine dose later this week   LOS: 18 days A FACE TO FACE EVALUATION WAS PERFORMED  Jennye Boroughs 12/22/2022, 8:33 AM

## 2022-12-22 NOTE — Progress Notes (Signed)
Notified MD(Shtridleman) of noted odor and cloudiness to urine. Per MD continue to monitor at this time.    Yehuda Mao, LPN

## 2022-12-22 NOTE — Progress Notes (Signed)
Physical Therapy Session Note  Patient Details  Name: [name] MRN: [id number] Date of Birth: 03-Dec-1950  Today's Date: 12/22/2022 PT Individual Time: 1005-1100 PT Individual Time Calculation (min): 55 min   Short Term Goals: Week 2:  PT Short Term Goal 1 (Week 2): Pt will perform supine<>sit min assist using bed features PT Short Term Goal 2 (Week 2): Pt will perform sit<>stands using LRAD with no more than mod assist consistently PT Short Term Goal 3 (Week 2): Pt will perform bed<>chair transfers using LRAD with no more than mod assist PT Short Term Goal 4 (Week 2): Pt will ambulate at least 47ft using litegait for safety but no BWS using LRAD  Skilled Therapeutic Interventions/Progress Updates: Pt presented in w/c agreeable to therapy. Pt c/o mild pain in neck and referred HA. Rest breaks provided as needed during session. Pt transported to main gym and performed St[place] transfer to high/low mat with heavy minA. Pt then participated in Sit to stand from 22in height. Pt was overall modA to stand, requiring multimodal cues for increased R knee extension and max multimodal cues for leaning into RW vs extending BUE and pushing shoulders into extension. Pt was able to perform x 4 consistently with rest breaks between bouts. Pt was unable to bring RLE more underneath pt but was able to achieve improved posture when RW pushed out slightly. PTA required to provide slow specific cues to pt to avoid pt going into high panic and stepping forward causing increased posterior lean. On last stand pt was able to take x 6 steps forward to allow w/c placement with heavy mod near maxA. Pt was able to advance BLE independently however would push shoulders back causing significantly increased posterior lean. Pt was able to step forward enough to allow w/c to be placed behind pt. Pt then worked on leaning anteriorly, placing LUE on bench and attempting to lean to engage quads and allow for increased anterior pelvic tilt.  Pt encouraged to continue this movement in preparation for future stands. This activity followed by working on anterior pelvic tilts (slouching/transitioning to erect posture) with PTA's hand on pt's low back for increased biofeedback. Pt then performed Sit to stand from [place] trying to incorporate technique into stand. Pt attempted but continues to have complete reliance on LUE to pull self up and requires max cues to extend LE's to complete stand. While standing in [place] PTA providing tactile cues to lean forward for improved alignment. Pt was even able to reach forward with LUE and tap board placed in front of pt. Pt returned to w/c and transported back to room at end of session. Pt left in w/c with call bell within reach and needs met.      Therapy Documentation Precautions:  Precautions Precautions: Shoulder Type of Shoulder Precautions: R-shoulder pain with ROM/WB Precaution Comments: History of frequent falls at home. Restrictions Weight Bearing Restrictions: No General:   Vital Signs: Therapy Vitals Temp: 98 F (36.7 C) Temp Source: Oral Pulse Rate: 74 Resp: 18 BP: (!) 112/46 Patient Position (if appropriate): Sitting Oxygen Therapy SpO2: 98 % O2 Device: Room Air Pain:   Mobility:   Locomotion :    Trunk/Postural Assessment :    Balance:   Exercises:   Other Treatments:      Therapy/Group: Individual Therapy  Brittlyn Cloe 12/22/2022, 4:11 PM

## 2022-12-23 DIAGNOSIS — M1711 Unilateral primary osteoarthritis, right knee: Secondary | ICD-10-CM

## 2022-12-23 LAB — GLUCOSE, CAPILLARY
Glucose-Capillary: 87 mg/dL (ref 70–99)
Glucose-Capillary: 193 mg/dL — ABNORMAL HIGH (ref 70–99)
Glucose-Capillary: 115 mg/dL — ABNORMAL HIGH (ref 70–99)
Glucose-Capillary: 240 mg/dL — ABNORMAL HIGH (ref 70–99)

## 2022-12-23 MED ORDER — DULOXETINE HCL 30 MG PO CPEP
60.0000 mg | ORAL_CAPSULE | Freq: Every day | ORAL | Status: DC
  Administered 2022-12-24 – 2023-01-06 (×14): 60 mg via ORAL
  Filled 2022-12-23 (×14): qty 2

## 2022-12-23 NOTE — Progress Notes (Signed)
Orthopedic Tech Progress Note Patient Details:  [name] [birth year] [id number] Hinged knee brace has been ordered from [facility]  Patient ID: [name], female   DOB: May 10, 1951, 72 y.o.   MRN: [id number]  Jearld Lesch 12/23/2022, 10:51 AM

## 2022-12-23 NOTE — Progress Notes (Addendum)
PROGRESS NOTE   Subjective/Complaints: Urine was cloudy but no symptoms. She continues to have knee and shoulder pain on Right. Reports no benefit in past with cortisone injections or Synvisc to knee.   Review of Systems  Constitutional:  Negative for fever.  Eyes:        Baseline blind R eye  Respiratory:  Negative for shortness of breath.   Cardiovascular:  Negative for chest pain and palpitations.  Gastrointestinal:  Negative for abdominal pain, constipation, nausea and vomiting.  Genitourinary:  Negative for dysuria, flank pain, frequency (improved), hematuria and urgency.  Musculoskeletal:  Positive for joint pain.  Neurological:  Positive for dizziness.  Psychiatric/Behavioral:  The patient is nervous/anxious.     Objective:   No results found. Recent Labs    12/21/22 0620  WBC 5.6  HGB 14.8  HCT 45.7  PLT 251    Recent Labs    12/21/22 0620  NA 137  K 3.6  CL 100  CO2 28  GLUCOSE 174*  BUN 15  CREATININE 0.71  CALCIUM 9.3      Shoulder Xray 11/30/22 Postsurgical changes are noted in the proximal right humerus with deformity identified. Underlying bony thorax appears within normal limits. No fracture or dislocation is seen. Stable bone island is noted in the mid humerus. No new focal abnormality is seen.   IMPRESSION: Postsurgical  changes without acute abnormality.     Intake/Output Summary (Last 24 hours) at 12/23/2022 0823 Last data filed at 12/23/2022 0700 Gross per 24 hour  Intake 560 ml  Output 375 ml  Net 185 ml         Physical Exam: Vital Signs Blood pressure 121/74, pulse 88, temperature 98 F (36.7 C), temperature source Oral, resp. rate 16, height 5\' 7"  (1.702 m), weight 89.5 kg, SpO2 94 %.  Physical Exam Constitution: Appropriate appearance for age. Sitting in bedside chair HEENT: R pupil fixed, dilated, blind; L pupil reactive, EOM grossly intact, + disconjugate gaze,  MMM Resp: CTAB. No rales, rhonchi, or wheezing. Good air movement Cardio: IIR, normal rate Abdomen: Nondistended. Nontender. +bowel sounds. Psych: very pleasant Skin: BLE with ichthyosis. RLE mepilex dressing in place. Finger bilateral hand flushed appearing. No appreciable LE edema today.      Neurologic/MsK:   Sensory exam: revealed reduced sensation to light touch over right plantar foot and heel. . Motor exam: Moving all 4 extremities to gravity R knee tenderness, R shoulder tenderness with palpation and Rom in all directions.  R knee joint line tenderness.  No abnormal tone noted  Prior Exam      Strength:                RUE: 4/5 SA, 5-/5 EF, 5-/5 EE, 5/5 WE, 5/5 FF, 5/5 FA                 LUE: 5/5 SA, 5/5 EF, 5/5 EE, 5/5 WE, 5/5 FF, 5/5 FA                 RLE: 3+/5 HF, 4+/5 KE, 4/5 DF,  5/5 PF  LLE:  3-/5 HF, 3+/5 KE, 5/5 DF,  5/5 PF  Coordination: Fine motor coordination was normal.      Assessment/Plan: 1. Functional deficits which require 3+ hours per day of interdisciplinary therapy in a comprehensive inpatient rehab setting. Physiatrist is providing close team supervision and 24 hour management of active medical problems listed below. Physiatrist and rehab team continue to assess barriers to discharge/monitor patient progress toward functional and medical goals  Care Tool:  Bathing    Body parts bathed by patient: Right arm, Chest, Abdomen, Front perineal area, Right upper leg, Left upper leg, Face, Left arm, Buttocks, Left lower leg, Right lower leg   Body parts bathed by helper: Left arm, Buttocks, Right lower leg, Left lower leg     Bathing assist Assist Level: Contact Guard/Touching assist (shower on BSC)     Upper Body Dressing/Undressing Upper body dressing   What is the patient wearing?: Pull over shirt    Upper body assist Assist Level: Supervision/Verbal cueing    Lower Body Dressing/Undressing Lower body dressing      What is the  patient wearing?: Incontinence brief, Pants     Lower body assist Assist for lower body dressing: Moderate Assistance - Patient 50 - 74% (in the stedy)     Toileting Toileting    Toileting assist Assist for toileting: Moderate Assistance - Patient 50 - 74%     Transfers Chair/bed transfer  Transfers assist     Chair/bed transfer assist level: Dependent - mechanical lift     Locomotion Ambulation   Ambulation assist   Ambulation activity did not occur: Safety/medical concerns          Walk 10 feet activity   Assist  Walk 10 feet activity did not occur: Safety/medical concerns        Walk 50 feet activity   Assist Walk 50 feet with 2 turns activity did not occur: Safety/medical concerns         Walk 150 feet activity   Assist Walk 150 feet activity did not occur: Safety/medical concerns         Walk 10 feet on uneven surface  activity   Assist Walk 10 feet on uneven surfaces activity did not occur: Safety/medical concerns         Wheelchair     Assist Is the patient using a wheelchair?: Yes Type of Wheelchair: Manual    Wheelchair assist level: Dependent - Patient 0% Max wheelchair distance: 150    Wheelchair 50 feet with 2 turns activity    Assist        Assist Level: Dependent - Patient 0%   Wheelchair 150 feet activity     Assist      Assist Level: Dependent - Patient 0%   Blood pressure 121/74, pulse 88, temperature 98 F (36.7 C), temperature source Oral, resp. rate 16, height 5\' 7"  (1.702 m), weight 89.5 kg, SpO2 94 %.  Medical Problem List and Plan: 1. Functional deficits secondary to debility due to acute on chronic HFpEF, atrial fibrillation with RVR             -patient may shower             -ELOS/Goals: 2/21, min A/mod A for gait  -Continue CIR, PT/OT  -Ambulated 65 feet yesterday  -Team conference tomorrow  2.  Antithrombotics: -DVT/anticoagulation:  Lovenox 40 mg daily (No full-dose AC d/t  Hx SAH 2007)             -  antiplatelet therapy: ASA 325 daily per IM until OP cards f/u 3. Pain Management:  Oxycodone or tramadol prn. Kpad ordered -encourage use of Voltaren gel 2g QID for OA right shoulder/right knee.   -R knee brace ordered 4. Mood/Behavior/Sleep: LCSW to follow for evaluation and support.              -Melatonin prn--12/05/22 ordered as scheduled 5mg  QHS, Trazodone PRN   -12/06/22 sleeping improving, continue current regimen -12/12/22 wants melatonin to be changed to PRN, ordered this; advised she will need to request sleep meds if she needs them -12/20/22 improved, cont regimen             -antipsychotic agents: N/A  -Nortriptyline 10mg  QHS, Neurontin 100mg  QHS 5. Neuropsych/cognition: This patient is capable of making decisions on her own behalf.  -start xanax 0.25 BID PRN for anxiety -Consulted Psychiatry- psychiatry reviewed her case and case discussed with  Sheran Fava, psych recommended continuation current medications encourage therapy and did not feel this issue would benefit from inpatient psych, ok to cancel consult.   -1/31 start low dose duloxetine 20mg , discussed risks and benefits of this medication,may also help with joint pain -2/2 increase duloxetine to 30mg  daily -2/7 increased duloxetine to 60mg   6. Skin/Wound Care: Add lotion to BLE and hands             -routine pressure relief measures.  7. Fluids/Electrolytes/Nutrition: Monitor I/O. Intake improving. Monitor weekly BMPs, next 12/21/22 -May need to stay dry to avoid fluid overload. 8. Acute on chronic diastolic CHF: Strict I/O w/daily weights. -Cardiac diet. Monitor for signs of overload.  -continue Lasix 40mg  QD, Aldactone 12.5mg  QD, metoprolol 100mg  QD, pravastatin 40mg  QD and Losartan 50mg  QD -monitor for recurrent hyperkalemia (Losartan dose decreased 01/14) -2/1 A little up from a few days ago but appears stable overall -2/7 wt a little up today, continue to monitor trend Filed Weights    12/21/22 0441 12/22/22 0320 12/23/22 0351  Weight: 87.3 kg 87.7 kg 89.5 kg     9. A Fib w/RVR: Monitor HR TID--continue Cardizem 180mg  QD and Metoprolol 100mg  QD 10. HTN: BPs with some lability, 130s-160s SBP -12/05/22 monitor for now, Losartan decreased 11/29/22, could consider going back to home dosing if appropriate -1/23 BP has been elevated, will increase losartan to 50mg   -2/6 well controlled Vitals:   12/19/22 1400 12/19/22 1925 12/20/22 0522 12/20/22 1522  BP: 131/69 (!) 140/77 (!) 122/58 108/65   12/20/22 1955 12/21/22 0100 12/21/22 0556 12/21/22 1956  BP: 139/79 (!) 149/65 (!) 147/77 128/74   12/22/22 0310 12/22/22 1521 12/22/22 1924 12/23/22 0351  BP: 138/70 (!) 112/46 124/66 121/74     11.  T1DM: Hgb A1c- 7.4. Monitor BS ac/hs and use SSI for elevated BS             -continue insulin glargline 15 units daily.  -May need to add meal coverage-->used 5- 7units tid prn PTA.  -12/05/22 CBGs 170s-200s, will increase Semglee to 16U QD, monitor -1/24 add 2 units mealtime insulin -1/25 increase mealtime to 4 units insulin, decrease lantus to 14 units, glucose has been higher in the day and lower in the Ams -1/26 decrease mealtime insulin to 3 units due to hypoglycemia, suspect this may have been due to poor PO intake yesterday, she refused semglee yesterday- will change to daytime -12/12/22 CBGs 200s overnight, monitor for now since Semglee adjusted yesterday -1/31 trending a little lower, decrease mealtime insulin dose to 2 units -2/1 decrease semglee  to 11 units from 14units, recommend nighttime snack tonight  -2/2 decreased to moderate resistance correctional SSI -12/19/22 CBGs widely varying, AM fasting 248, if continuing then could consider retitrating semglee, monitor for now -12/20/22 daytime CBGs yesterday up a bit but AM fasting 149 today, will increase mealtime novolog to 3U again and leave Semglee as is; monitor closely -12/21/22 increase semglee to 13u -2/7 fair control CBG,  continue to monitor  CBG (last 3)  Recent Labs    12/22/22 1621 12/22/22 2054 12/23/22 0621  GLUCAP 113* 205* 87      12. Chronic bilateral shoulder and left knee pain: Oxycodone prn -Refusing voltaren gel for local measures-->encourage use             -Aquathermia for local measures.              -L knee: Follows OP ortho, no benefit from injections in the past  13. Upper respiratory infection, Nocturnal hypoxia: Question sleep apnea.  -Encourage pulmonary hygiene -Respiratory viral panel negative x2 inpatient -Remains with cough, intermittent SOB - add PRN inhaler, monitor  14.  Pre-renal azotemia: Likely due to diuresis. BUN up but SCr improving.   -Continue to monitor.  -2/5 Bun and Cr stable  15. Overactive bladder, incontinence: hx of, on Myrbetriq 50mg  QD; now off purewick -12/05/22 having increased frequency and incontinence, ordered U/A, UCx, and scheduled voiding program -12/06/22 U/A c/w UTI, see #16 -Continue myrbetriq,she is mostly continent with occasional incontinent episodes -12/19/22 continues to have improvement, cont regimen  16. UTI 12/06/22:  -U/A c/w UTI, ordered Macrobid 100mg  BID x5 days (12/06/22>>12/10/22 last dose), UCx pending- Ecoli sensitive to macrobid, completed course, symptoms resolved -1/7 cloudy urine yesterday, asymptomatic, continue to monitor  17.  Hypokalemia  -40meq x2, recheck tomorrow  -K+ up to 3.6, losartan increased today, Recheck tomorrow -1/25 K+ now elevated hyperkalemia at 5.4, lokelma 5g  x1, stop potassium supplementation, discussed with pharmacy, recheck tomorrow  -2/5 K+ 3.6, will supplement with 102meq today  18. Dizziness -1/22 Will start meclizine 12.5mg  PRN, check orthostatic VS -She has HX of vertigo going back to 2013, seen by Dr. Jacelyn Grip [facility], felt to be related to vestibulopathy -1/30 fall yesterday but no injury -2/2 increase meclizine to 25mg  TID prn  19. Constipation: reported as chronic  -Schedule miralax QD,  PRN dulcolax supp and fleet enema -1/26 had vomiting with  sorbitol yesterday, continue miralax daily, she had large BM yesterday -1/29 miralax increased to BID -1/30 she declines stronger medication at this time,order sorbitol PRN -2/1 large bm yesterday, improved  -12/19/22 now having increased stool frequency, wants to change Miralax to BID PRN rather than scheduled; also wants Colace 100mg  BID PRN in case she needs it; knows to ask -12/20/22 stable, monitor on current regimen (PRNs only)  20. R shoulder pain  -Xray on 1/15 with postsurgical changes, continue voltaren gel, Kpad  -1/31 Duloxetine started as this may help with joint pain also -2/2 Duloxetine increase to 30mg  -2/7 increased duloxetine to 60mg    LOS: 19 days A FACE TO FACE EVALUATION WAS PERFORMED  Jennye Boroughs 12/23/2022, 8:23 AM

## 2022-12-23 NOTE — Progress Notes (Signed)
Physical Therapy Session Note  Patient Details  Name: [name] MRN: [id number] Date of Birth: [birth year]  Today's Date: 12/23/2022 PT Individual Time: 1105-1205 and 1445-1530 PT Individual Time Calculation (min): 60 min and 45 min  Short Term Goals: Week 2:  PT Short Term Goal 1 (Week 2): Pt will perform supine<>sit min assist using bed features PT Short Term Goal 2 (Week 2): Pt will perform sit<>stands using LRAD with no more than mod assist consistently PT Short Term Goal 3 (Week 2): Pt will perform bed<>chair transfers using LRAD with no more than mod assist PT Short Term Goal 4 (Week 2): Pt will ambulate at least 28ft using litegait for safety but no BWS using LRAD  Skilled Therapeutic Interventions/Progress Updates: Pt presented in w/c agreeable to therapy. Pt states unrated pain in L knee and R shoulder with pt states premedicated. Rest breaks and repositioning provided as needed with PTA ace bandaging L knee. Pt transported to main gym and performed Stedy transfer to high/low mat. Session focused on Sit to stand and anterior weight shifting while in standing. Pt requiring mod near minA for stand however required max facilitation to lean forward vs thrusting hips forward and pushing shoulders/chest into extension. Pt performed x 2 stands in [place] with PTA in front and pt plasing LUE on this therapists's shoulder and RUE on therapist's arm. Pt was able to power up to stand with just a little facilitation to lean forward.  In [place] pt was able to stand short bouts 10-15 seconds before noting increased posterior lean and increased B knee flexion. PTA then placed blue theraband behind back at chest and pt stood additional x 2 times with minA from therapy tech and noted improved forward lean. Pt would intermittent be able to achieve a nearly full erect stand however would become increasingly anxious and push into posterior bias. At end of session pt transferred back to w/c via [facility] and transported  back to room. Pt left in w/c at end of session with call bell within reach and needs met.   Tx2: Pt presented in w/c  agreeable to therapy. Pt noted increased pain in L knee as compared to am session. Newly ordered knee brace on bed, PTA attempted to don however unable to fit appropriately due to being too small (RN notified and larger brace ordered). Pt transported to main gym and transferred to high/low mat with Stedy. Pt worked on Sit to stand in stedy with PTA providing facilitation to increase anterior lean and tapping at RLE to improve TKE. In [place] pt was able to stand and perform reaching tasks with LUE stacking cones to work on forward weight shifting in safe environment. Pt's husband then present with PTA and pt demonstrating stand in [place] vs Stand in RW. Pt stood x 3 with RW and modA however was unable to achieve full erect posture due to B knee flexion and increased pain. Verbalized to husband that currently working on standing safely before we can progress ambulation with husband verbalizing understanding. Pt then transferred back to w/c via Stedy and CGA. Pt transported back to room at end of session and remained in w/c with call bell within reach and needs met.   *PTA and pt with frank discussion regarding current level of mobility and plausibility of requiring a PWC to maintain some independence. Discussed that currently requiring extensive assistance for standing and ambulation not functional at this time. Advised we are going to continue to work on standing, transfers, and ambulation  but also need to consider d/c of 21st and what we anticipate safest for d/c. Will continue to discuss with pt options for safe mobility.      Therapy Documentation Precautions:  Precautions Precautions: Shoulder Type of Shoulder Precautions: R-shoulder pain with ROM/WB Precaution Comments: History of frequent falls at home. Restrictions Weight Bearing Restrictions: No General:   Vital Signs: Therapy  Vitals Temp: 97.8 F (36.6 C) Temp Source: Oral Pulse Rate: 78 Resp: 15 BP: (!) 103/51 Patient Position (if appropriate): Sitting Oxygen Therapy SpO2: 93 % O2 Device: Room Air Pain:   Mobility:   Locomotion :    Trunk/Postural Assessment :    Balance:   Exercises:   Other Treatments:      Therapy/Group: Individual Therapy  Raiyan Dalesandro 12/23/2022, 4:25 PM

## 2022-12-23 NOTE — Progress Notes (Signed)
Physical Therapy Weekly Progress Note  Patient Details  Name: Aniyla Harling MRN: [id number] Date of Birth: Mar 23, 1951  Beginning of progress report period: December 16, 2022 End of progress report period: December 23, 2022  Today's Date: 12/24/2022  Patient has met 2 of 4 short term goals.  Pt is making slower than anticipated progress towards goals. Pt has demonstrated improvement in bed mobility and can perform supine to sit with as little as CGA but may need assistance for sit to supine particularly for LE management. Pt has shown significant improvement in use of Stedy (performing Sit to stand in [place]) and this has been the primary method to transfer between bed/w/c/BSC. W/C mobility has been limited due to chronic R shoulder issues and L knee pain (when attempting to propel using BLE). Pt has worked on [employer] to stand from an elevated surface with RW. She can perform with as little as minA however requires significant asistance to maintain standing due to extending BUE and putting self in more of a posterior lean with increases anxiety. Pt's anxiety continues to be a limiting factor in her progression of mobility. In the past week gait has been initiated in [place] and has progressed to gait with RW requiring 2-3 people but distances and levels of assist has been inconsistent. Conversations have been ongoing regarding purchasing a Stedy for transfers or use of [place]. Anticipate family (husband and dgt) to be present for a session or 2 next week to observe pt's current status.   Patient continues to demonstrate the following deficits muscle weakness and muscle joint tightness and therefore will continue to benefit from skilled PT intervention to increase functional independence with mobility.  Patient progressing toward long term goals..  Continue plan of care.  PT Short Term Goals Week 2:  PT Short Term Goal 1 (Week 2): Pt will perform supine<>sit min assist using bed features PT Short Term Goal 1 -  Progress (Week 2): Progressing toward goal PT Short Term Goal 2 (Week 2): Pt will perform sit<>stands using LRAD with no more than mod assist consistently PT Short Term Goal 2 - Progress (Week 2): Progressing toward goal PT Short Term Goal 3 (Week 2): Pt will perform bed<>chair transfers using LRAD with no more than mod assist PT Short Term Goal 3 - Progress (Week 2): Progressing toward goal PT Short Term Goal 4 (Week 2): Pt will ambulate at least 30ft using litegait for safety but no BWS using LRAD PT Short Term Goal 4 - Progress (Week 2): Progressing toward goal Week 3:  PT Short Term Goal 1 (Week 3): Pt will perform supine<>sit with minA consistently. PT Short Term Goal 2 (Week 3): Pt will performs sit<>stand using LRAD with modA consistently. PT Short Term Goal 3 (Week 3): Pt will perform bed<>chair transfer consistently with modA PT Short Term Goal 4 (Week 3): Pt will ambulate at least 10' with modA +1 and LRAD.  Skilled Therapeutic Interventions/Progress Updates:      Therapy Documentation Precautions:  Precautions Precautions: Shoulder Type of Shoulder Precautions: R-shoulder pain with ROM/WB Precaution Comments: History of frequent falls at home. Restrictions Weight Bearing Restrictions: No  Therapy/Group: Individual Therapy  Regis Bill, PT, DPT 12/24/2022, 10:36 AM

## 2022-12-23 NOTE — Progress Notes (Addendum)
Patient ID: [name], female   DOB: 1950-11-19, 72 y.o.   MRN: [id number]  met with pt and spoke with jennifer-daughter via telephone to discuss team conference progress this week and discharge still 2/21. Pt really wants to be walking by discharge but has many obstacles for doing this. Anderson Malta asked good questions and wants to speak with the therapists regarding getting home ready and other questions she has. Both Sandra-OT and Rosita-PT to call her. Discussed home health services pt has had before and would like [facility] again. Also discussed possibly hiring private duty for a few hours per day and left list for daughter in her room. Will continue to work on safest plan for pt and husband. Will try a hiunged knee brace for her knee and see if helps with her stability

## 2022-12-23 NOTE — Progress Notes (Signed)
Occupational Therapy Session Note  Patient Details  Name: [name] MRN: [id number] Date of Birth: 29-Jul-1951  Today's Date: 12/23/2022 OT Individual Time: 0845-1000 OT Individual Time Calculation (min): 75 min    Short Term Goals: Week 3:  OT Short Term Goal 1 (Week 3): Pt will stand consistently in the stedy with min A OT Short Term Goal 2 (Week 3): Pt will complete bed mobilty rolling R and L with min A OT Short Term Goal 3 (Week 3): Pt will complete LB bathing at bed level with min A  Skilled Therapeutic Interventions/Progress Updates:    Pt received sitting on the commode, mid transfer with NT. She completed UB bathing and dressing perched in the stedy to promote trunk engagement and weightbearing through the BLE. She required intermittent CGA. (S) overall provided for UB ADLs. She completed sit > stand at the sink with heavy mod A and heavy blocking/facilitation of extension at the L knee. Pt reporting L knee pain up to 8/10 when transitioning. She required max A to pull up pants in standing. Pt was taken via w/c to the therapy gym for time management. At a high table she worked on sit <> stands. Heavy focus on B knee extension, forward weight shift, and power up into stand. She required several attempts each time 2/2 anxiety limiting initial power up. She required heavy mod A and heavy blocking/extension of the knee. She completed 1 set of (very) mini squats to address posterior chain strengthening and standing balance. Pt progressed to needing only min-mod A for standing balance during this activity.  Pt returned to her room and was left sitting up with all needs met.   Therapy Documentation Precautions:  Precautions Precautions: Shoulder Type of Shoulder Precautions: R-shoulder pain with ROM/WB Precaution Comments: History of frequent falls at home. Restrictions Weight Bearing Restrictions: No   Therapy/Group: Individual Therapy  Curtis Sites 12/23/2022, 9:30 AM

## 2022-12-23 NOTE — Patient Care Conference (Signed)
Inpatient RehabilitationTeam Conference and Plan of Care Update Date: 12/23/2022   Time: 112:14 PM    Patient Name: [name]      Medical Record Number: [id number]  Date of Birth: 04-13-1951 Sex: Female         Room/Bed: 4M08C/4M08C-01 Payor Info: Payor: [place] / Plan: BCBS MEDICARE / Product Type: *No Product type* /    Admit Date/Time:  12/04/2022  3:43 PM  Primary Diagnosis:  <principal problem not specified>  Hospital Problems: Active Problems:   Essential hypertension   Type 1 diabetes mellitus on insulin therapy ([place])   Primary osteoarthritis of both hands   A-fib ([place])   Chronic diastolic congestive heart failure ([place])   Constipation    Expected Discharge Date: Expected Discharge Date: 01/06/23  Team Members Present: Physician leading conference: Dr. Jennye Boroughs Social Worker Present: Ovidio Kin, LCSW Nurse Present: Dorien Chihuahua, RN PT Present: Phylliss Bob, PTA;Sydney Rafoth, PT OT Present: Laverle Hobby, OT PPS Coordinator present : Gunnar Fusi, SLP     Current Status/Progress Goal Weekly Team Focus  Bowel/Bladder   Pt is continent of b/b. LBM: 2/4   Remain continent of b/b.   Assist w/ toileting needs q 2-4 hours and as needed.    Swallow/Nutrition/ Hydration               ADL's   Has progressed significantly with standing in the Rw and even mobility, however difficult to discern how this will translate to home. (S) UB ADLs, mod A LB ADLs. Still with limiting R shoulder   min A   ADLs, transfers, d/c planning, standing balance, RUE pain management and AROM    Mobility   min to modA bed mobility, pt able to transfer with [place], maxA 1-2 standing transfer with RW, ambulation currently not functional   min assist w/ bed mobility and transfers, mod assist for gait at least 10-15 ft  Standing with RW, TRANSFERS, pre-gait/gait, family ed.    Communication                Safety/Cognition/ Behavioral Observations                Pain   Pt c/o back of neck pain and R shoulder rating it as a 8/10. PRN pain medication given and voltaren ointment applied.   Manage pain at a tolerable level of 4/10 or below.   Assess pain q shift & PRN.    Skin   Skin tear on RLE - foam in place. BLE - dry, flaky skin > lotions per orders given   Maintain skin integrity.  Assess skin q shift & PRN.      Discharge Planning:  Making good progres in hr therapies this week, plan to be home with husband who will need to come in closer to discharge and do hands on care. Pt pleased with herself and feels can see the progress   Team Discussion: Patient with pain in knee; MD ordered knee brace and adjusted medications. DM managed. Patient on target to meet rehab goals: yes, continues to make progress with therapies.  *See Care Plan and progress notes for long and short-term goals.   Revisions to Treatment Plan:  N/a  Teaching Needs: Safety, medications, transfers, toileting, home modification recommendations, etc.  Current Barriers to Discharge: Decreased caregiver support and Home enviroment access/layout  Possible Resolutions to Barriers: Family education HH follow up services DME: stedy recommended, power chair Private duty list provided to family  Medical Summary Current Status: debility due to acute on chronic HFpEF, atrial fibrillation with RVR, Knee and shoulder pain, dizziness, anxiety, DM1  Barriers to Discharge: Behavior/Mood;Medical stability;Uncontrolled Pain  Barriers to Discharge Comments: debility due to acute on chronic HFpEF, atrial fibrillation with RVR, Knee and shoulder pain, dizziness, anxiety, DM1, recent UTI Possible Resolutions to Barriers/Weekly Focus: knee brace ordered, increase duloxetine, monitor bladder function   Continued Need for Acute Rehabilitation Level of Care: The patient requires daily medical management by a physician with specialized training in physical medicine and  rehabilitation for the following reasons: Direction of a multidisciplinary physical rehabilitation program to maximize functional independence : Yes Medical management of patient stability for increased activity during participation in an intensive rehabilitation regime.: Yes Analysis of laboratory values and/or radiology reports with any subsequent need for medication adjustment and/or medical intervention. : Yes   I attest that I was present, lead the team conference, and concur with the assessment and plan of the team.   Dorien Chihuahua B 12/23/2022, 3:57 PM

## 2022-12-23 NOTE — Progress Notes (Signed)
Occupational Therapy Session Note  Patient Details  Name: [name] MRN: [id number] Date of Birth: [birth year]  Today's Date: 12/23/2022 OT Individual Time: 1300-1330 OT Individual Time Calculation (min): 30 min    Short Term Goals: Week 1:  OT Short Term Goal 1 (Week 1): Pt will complete STS transfer with Mod A + LRAD in preparation for standing ADL tasks. OT Short Term Goal 1 - Progress (Week 1): Met OT Short Term Goal 2 (Week 1): Pt will complete simple bathing activities with consistent Mod A + LRAD. OT Short Term Goal 2 - Progress (Week 1): Met OT Short Term Goal 3 (Week 1): Pt will complete LB dressing with Mod A + LRAD. OT Short Term Goal 3 - Progress (Week 1): Not met  Skilled Therapeutic Interventions/Progress Updates:    Pt received in w/c with 5 out of 10 pain in R shoulder. Spent session focus on pain management of R shoulder using moist heat, myofascial release, and scraping to release trigger points to decrease pain in traps, suprispinatus and  deltoid. Pt left at end of session in w/c with exit alarm on, call light in reach and all needs met   Therapy Documentation Precautions:  Precautions Precautions: Shoulder Type of Shoulder Precautions: R-shoulder pain with ROM/WB Precaution Comments: History of frequent falls at home. Restrictions Weight Bearing Restrictions: No General:    Therapy/Group: Individual Therapy  Tonny Branch 12/23/2022, 6:54 AM

## 2022-12-24 LAB — GLUCOSE, CAPILLARY
Glucose-Capillary: 194 mg/dL — ABNORMAL HIGH (ref 70–99)
Glucose-Capillary: 261 mg/dL — ABNORMAL HIGH (ref 70–99)
Glucose-Capillary: 131 mg/dL — ABNORMAL HIGH (ref 70–99)
Glucose-Capillary: 72 mg/dL (ref 70–99)

## 2022-12-24 NOTE — Progress Notes (Signed)
Physical Therapy Session Note  Patient Details  Name: [name] MRN: [id number] Date of Birth: 17-Jul-1951  Today's Date: 12/24/2022 PT Individual Time: 8119-1478 + 1448-1530 PT Individual Time Calculation (min): 55 min + 42 min  Short Term Goals: Week 2:  PT Short Term Goal 1 (Week 2): Pt will perform supine<>sit min assist using bed features PT Short Term Goal 1 - Progress (Week 2): Progressing toward goal PT Short Term Goal 2 (Week 2): Pt will perform sit<>stands using LRAD with no more than mod assist consistently PT Short Term Goal 2 - Progress (Week 2): Progressing toward goal PT Short Term Goal 3 (Week 2): Pt will perform bed<>chair transfers using LRAD with no more than mod assist PT Short Term Goal 3 - Progress (Week 2): Progressing toward goal PT Short Term Goal 4 (Week 2): Pt will ambulate at least 16ft using litegait for safety but no BWS using LRAD PT Short Term Goal 4 - Progress (Week 2): Progressing toward goal  Skilled Therapeutic Interventions/Progress Updates:     Session 1: Chart reviewed and pt agreeable to therapy. Pt received semi-reclined in bed with no c/o pain. Session focused on functional transfers to promote RW use necessary for home access. Pt initiated session with transfer to EOB using SBA + bed features. Pt required totalA to don shoes. Pt then completed sit to stand in STEDY with MinA +2 to WC. Pt transported to therapy gym for time management. Pt transferred to mat table in STEDY with ModA +2 from [place]. Pt then completed multiple rounds of sit to stand from mat table using MaxA + RW + block under R hand for push off progressing to [place] + RW + block. Pt required VC for sequencing but progressed ability to verbalize sequencing independently. Pt remains in need for VC for safe sitting technique. In standing, pt initiated pre-gait activity of lateral weight shifts, though pt limited in range and required ModA for balance. Pt began to require MaxA for stand  demonstrating fatigue. Pt returned to [facility] with [place]. Pt returned to room and completed 1x10 LAQ + DF. At end of session, pt was left seated in [facility] with alarm engaged, nurse call bell and all needs in reach.  Session 2: Chart reviewed and pt agreeable to therapy. Pt received seated in WC with no c/o pain and L knee brace donned. Session focused on safe sitting technique to promote safe transfers and decrease fall risk. Pt initiated session with sit to stand in STEDY with MinA. Pt then sat in STEDY while PT demonstrated safe sitting technique and sequencing. Pt practiced 10 sit<>stands in STEDY with verbal cues to bring nose down and hips back. Once pt demonstrated proper technique, PT removed STEDY seat and pt attempted to sit fully to WC. Pt noted pn in R arm. PT and pt modified technique to place L arm close to center on STEDY before bringing nose down and hips back. Pt completed 5 sit<>stands between WC and STEDY with VC for sequencing as described + MinA with stand. Pt then completed 5 sit<>stands between WC and STEDY while pt verbalized sequencing instead of PT. Pt demonstrated good understanding of sitting technique. PT and pt then discussed need to promote independence with STEDY standing. Pt then completed 3 sit<>stands in STEDY with MinA progressing to CGA for stand and supervision for sitting with pt verbalizing technique and no VC. L knee brace doffed to promote blood flow. At end of session, pt was left seated  in [facility] with alarm engaged, nurse call bell and all needs in reach.    Therapy Documentation Precautions:  Precautions Precautions: Shoulder Type of Shoulder Precautions: R-shoulder pain with ROM/WB Precaution Comments: History of frequent falls at home. Restrictions Weight Bearing Restrictions: No    Therapy/Group: Individual Therapy  Marquette Old, PT, DPT 12/24/2022, 12:22 PM

## 2022-12-24 NOTE — Progress Notes (Signed)
PROGRESS NOTE   Subjective/Complaints: Urine now reported by be clear/yellow. She reports knee brace was too small, larger one to be tried. No additional concerns. Or complaints.   Review of Systems  Constitutional:  Negative for chills and fever.  Eyes:        Baseline blind R eye  Respiratory:  Negative for shortness of breath.   Cardiovascular:  Negative for chest pain and palpitations.  Gastrointestinal:  Negative for abdominal pain, constipation, nausea and vomiting.  Genitourinary:  Negative for dysuria, flank pain, frequency (improved), hematuria and urgency.  Musculoskeletal:  Positive for joint pain.  Skin:  Negative for rash.  Neurological:  Positive for dizziness. Negative for headaches.  Psychiatric/Behavioral:  The patient is nervous/anxious.     Objective:   No results found. No results for input(s): "WBC", "HGB", "HCT", "PLT" in the last 72 hours.  No results for input(s): "NA", "K", "CL", "CO2", "GLUCOSE", "BUN", "CREATININE", "CALCIUM" in the last 72 hours.    Shoulder Xray 11/30/22 Postsurgical changes are noted in the proximal right humerus with deformity identified. Underlying bony thorax appears within normal limits. No fracture or dislocation is seen. Stable bone island is noted in the mid humerus. No new focal abnormality is seen.   IMPRESSION: Postsurgical  changes without acute abnormality.     Intake/Output Summary (Last 24 hours) at 12/24/2022 0818 Last data filed at 12/24/2022 0755 Gross per 24 hour  Intake 417 ml  Output 150 ml  Net 267 ml         Physical Exam: Vital Signs Blood pressure (!) 143/61, pulse 71, temperature 97.8 F (36.6 C), temperature source Oral, resp. rate 16, height 5\' 7"  (1.702 m), weight 88.1 kg, SpO2 95 %.  Physical Exam Constitution: Appropriate appearance for age. Sitting in bed, appears comfortable HEENT: R pupil fixed, dilated, blind; L pupil reactive,  EOM grossly intact, + disconjugate gaze, MMM Resp: CTAB. NO RRW Cardio: IIR, normal rate Abdomen: soft, Nondistended. Nontender. +bowel sounds. Psych: very pleasant appropriate Skin: BLE with ichthyosis. RLE mepilex dressing in place. Finger bilateral hand flushed appearing. No appreciable LE edema today.      Neurologic/MsK:   Sensory exam: revealed reduced sensation to light touch over right plantar foot and heel. . Motor exam: Moving all 4 extremities to gravity R knee tenderness, R shoulder tenderness with palpation and Rom in all directions.  R knee joint line tenderness.  No abnormal tone noted  Prior Exam      Strength:                RUE: 4/5 SA, 5-/5 EF, 5-/5 EE, 5/5 WE, 5/5 FF, 5/5 FA                 LUE: 5/5 SA, 5/5 EF, 5/5 EE, 5/5 WE, 5/5 FF, 5/5 FA                 RLE: 3+/5 HF, 4+/5 KE, 4/5 DF,  5/5 PF                 LLE:  3-/5 HF, 3+/5 KE, 5/5 DF,  5/5 PF  Coordination: Fine motor coordination was normal.  Assessment/Plan: 1. Functional deficits which require 3+ hours per day of interdisciplinary therapy in a comprehensive inpatient rehab setting. Physiatrist is providing close team supervision and 24 hour management of active medical problems listed below. Physiatrist and rehab team continue to assess barriers to discharge/monitor patient progress toward functional and medical goals  Care Tool:  Bathing    Body parts bathed by patient: Right arm, Chest, Abdomen, Front perineal area, Right upper leg, Left upper leg, Face, Left arm, Buttocks, Left lower leg, Right lower leg   Body parts bathed by helper: Left arm, Buttocks, Right lower leg, Left lower leg     Bathing assist Assist Level: Contact Guard/Touching assist (shower on BSC)     Upper Body Dressing/Undressing Upper body dressing   What is the patient wearing?: Pull over shirt    Upper body assist Assist Level: Supervision/Verbal cueing    Lower Body Dressing/Undressing Lower body  dressing      What is the patient wearing?: Incontinence brief, Pants     Lower body assist Assist for lower body dressing: Moderate Assistance - Patient 50 - 74% (in the stedy)     [place] for toileting: Moderate Assistance - Patient 50 - 74%     Transfers Chair/bed transfer  Transfers assist     Chair/bed transfer assist level: Dependent - mechanical lift     Locomotion Ambulation   Ambulation assist   Ambulation activity did not occur: Safety/medical concerns          Walk 10 feet activity   Assist  Walk 10 feet activity did not occur: Safety/medical concerns        Walk 50 feet activity   Assist Walk 50 feet with 2 turns activity did not occur: Safety/medical concerns         Walk 150 feet activity   Assist Walk 150 feet activity did not occur: Safety/medical concerns         Walk 10 feet on uneven surface  activity   Assist Walk 10 feet on uneven surfaces activity did not occur: Safety/medical concerns         Wheelchair     Assist Is the patient using a wheelchair?: Yes Type of Wheelchair: Manual    Wheelchair assist level: Dependent - Patient 0% Max wheelchair distance: 150    Wheelchair 50 feet with 2 turns activity    Assist        Assist Level: Dependent - Patient 0%   Wheelchair 150 feet activity     Assist      Assist Level: Dependent - Patient 0%   Blood pressure (!) 143/61, pulse 71, temperature 97.8 F (36.6 C), temperature source Oral, resp. rate 16, height 5\' 7"  (1.702 m), weight 88.1 kg, SpO2 95 %.  Medical Problem List and Plan: 1. Functional deficits secondary to debility due to acute on chronic HFpEF, atrial fibrillation with RVR             -patient may shower             -ELOS/Goals: 2/21, min A/mod A for gait at least 10-15 feet  -Continue CIR, PT/OT   2.  Antithrombotics: -DVT/anticoagulation:  Lovenox 40 mg daily (No full-dose AC d/t  Hx SAH 2007)             -antiplatelet therapy: ASA 325 daily per IM until OP cards f/u 3. Pain Management:  Oxycodone or tramadol prn. Kpad ordered -encourage use of Voltaren  gel 2g QID for OA right shoulder/right knee.   -R knee brace ordered 4. Mood/Behavior/Sleep: LCSW to follow for evaluation and support.              -Melatonin prn--12/05/22 ordered as scheduled 5mg  QHS, Trazodone PRN   -12/06/22 sleeping improving, continue current regimen -12/12/22 wants melatonin to be changed to PRN, ordered this; advised she will need to request sleep meds if she needs them -12/20/22 improved, cont regimen             -antipsychotic agents: N/A  -Nortriptyline 10mg  QHS, Neurontin 100mg  QHS 5. Neuropsych/cognition: This patient is capable of making decisions on her own behalf.  -start xanax 0.25 BID PRN for anxiety -Consulted Psychiatry- psychiatry reviewed her case and case discussed with  Sheran Fava, psych recommended continuation current medications encourage therapy and did not feel this issue would benefit from inpatient psych, ok to cancel consult.   -1/31 start low dose duloxetine 20mg , discussed risks and benefits of this medication,may also help with joint pain -2/2 increase duloxetine to 30mg  daily -2/7 increased duloxetine to 60mg   Anxiety reported to be a little improved with medications 6. Skin/Wound Care: Add lotion to BLE and hands             -routine pressure relief measures.  7. Fluids/Electrolytes/Nutrition: Monitor I/O. Intake improving. Monitor weekly BMPs, next 12/21/22 -May need to stay dry to avoid fluid overload. 8. Acute on chronic diastolic CHF: Strict I/O w/daily weights. -Cardiac diet. Monitor for signs of overload.  -continue Lasix 40mg  QD, Aldactone 12.5mg  QD, metoprolol 100mg  QD, pravastatin 40mg  QD and Losartan 50mg  QD -monitor for recurrent hyperkalemia (Losartan dose decreased 01/14) -2/1 A little up from a few days ago but appears stable overall -2/8 weight  overall stable, continue to monitor Filed Weights   12/22/22 0320 12/23/22 0351 12/24/22 0414  Weight: 87.7 kg 89.5 kg 88.1 kg     9. A Fib w/RVR: Monitor HR TID--continue Cardizem 180mg  QD and Metoprolol 100mg  QD 10. HTN: BPs with some lability, 130s-160s SBP -12/05/22 monitor for now, Losartan decreased 11/29/22, could consider going back to home dosing if appropriate -1/23 BP has been elevated, will increase losartan to 50mg   -2/8 well controled Vitals:   12/20/22 1522 12/20/22 1955 12/21/22 0100 12/21/22 0556  BP: 108/65 139/79 (!) 149/65 (!) 147/77   12/21/22 1956 12/22/22 0310 12/22/22 1521 12/22/22 1924  BP: 128/74 138/70 (!) 112/46 124/66   12/23/22 0351 12/23/22 1318 12/23/22 2030 12/24/22 0414  BP: 121/74 (!) 103/51 (!) 118/57 (!) 143/61     11.  T1DM: Hgb A1c- 7.4. Monitor BS ac/hs and use SSI for elevated BS             -continue insulin glargline 15 units daily.  -May need to add meal coverage-->used 5- 7units tid prn PTA.  -12/05/22 CBGs 170s-200s, will increase Semglee to 16U QD, monitor -1/24 add 2 units mealtime insulin -1/25 increase mealtime to 4 units insulin, decrease lantus to 14 units, glucose has been higher in the day and lower in the Ams -1/26 decrease mealtime insulin to 3 units due to hypoglycemia, suspect this may have been due to poor PO intake yesterday, she refused semglee yesterday- will change to daytime -12/12/22 CBGs 200s overnight, monitor for now since Semglee adjusted yesterday -1/31 trending a little lower, decrease mealtime insulin dose to 2 units -2/1 decrease semglee to 11 units from 14units, recommend nighttime snack tonight  -2/2 decreased to moderate resistance correctional SSI -12/19/22  CBGs widely varying, AM fasting 248, if continuing then could consider retitrating semglee, monitor for now -12/20/22 daytime CBGs yesterday up a bit but AM fasting 149 today, will increase mealtime novolog to 3U again and leave Semglee as is; monitor  closely -12/21/22 increase semglee to 13u -2/8 fair control, with occasional elevated value, continue to monitor  CBG (last 3)  Recent Labs    12/23/22 1630 12/23/22 2104 12/24/22 0636  GLUCAP 115* 240* 194*      12. Chronic bilateral shoulder and left knee pain: Oxycodone prn -Refusing voltaren gel for local measures-->encourage use             -Aquathermia for local measures.              -L knee: Follows OP ortho, no benefit from injections in the past  -Will need to get larger knee brace  13. Upper respiratory infection, Nocturnal hypoxia: Question sleep apnea.  -Encourage pulmonary hygiene -Respiratory viral panel negative x2 inpatient -Remains with cough, intermittent SOB - add PRN inhaler, monitor  14.  Pre-renal azotemia: Likely due to diuresis. BUN up but SCr improving.   -Continue to monitor.  -2/5 Bun and Cr stable  15. Overactive bladder, incontinence: hx of, on Myrbetriq 50mg  QD; now off purewick -12/05/22 having increased frequency and incontinence, ordered U/A, UCx, and scheduled voiding program -12/06/22 U/A c/w UTI, see #16 -Continue myrbetriq,she is mostly continent with occasional incontinent episodes -12/19/22 continues to have improvement, cont regimen  16. UTI 12/06/22:  -U/A c/w UTI, ordered Macrobid 100mg  BID x5 days (12/06/22>>12/10/22 last dose), UCx pending- Ecoli sensitive to macrobid, completed course, symptoms resolved -1/7 cloudy urine yesterday, asymptomatic, continue to monitor 1/8 urine recorded as clear yellow  17.  Hypokalemia  -12/12/22 x2, recheck tomorrow  -K+ up to 3.6, losartan increased today, Recheck tomorrow -1/25 K+ now elevated hyperkalemia at 5.4, lokelma 5g  x1, stop potassium supplementation, discussed with pharmacy, recheck tomorrow  -2/5 K+ 3.6, will supplement with today  18. Dizziness -1/22 Will start meclizine 12.5mg  PRN, check orthostatic VS -She has HX of vertigo going back to 2013, seen by Dr. 2/22 Neurology, felt to  be related to vestibulopathy -1/30 fall yesterday but no injury -2/2 increase meclizine to 25mg  TID prn  19. Constipation: reported as chronic  -Schedule miralax QD, PRN dulcolax supp and fleet enema -1/26 had vomiting with  sorbitol yesterday, continue miralax daily, she had large BM yesterday -1/29 miralax increased to BID -1/30 she declines stronger medication at this time,order sorbitol PRN -2/1 large bm yesterday, improved  -12/19/22 now having increased stool frequency, wants to change Miralax to BID PRN rather than scheduled; also wants Colace 100mg  BID PRN in case she needs it; knows to ask -12/20/22 stable, monitor on current regimen (PRNs only)  20. R shoulder pain  -Xray on 1/15 with postsurgical changes, continue voltaren gel, Kpad  -1/31 Duloxetine started as this may help with joint pain also -2/2 Duloxetine increase to 30mg  -2/7 increased duloxetine to 60mg    LOS: 20 days A FACE TO FACE EVALUATION WAS PERFORMED  02/18/23 12/24/2022, 8:18 AM

## 2022-12-24 NOTE — Progress Notes (Signed)
Recreational Therapy Session Note  Patient Details  Name: [name] MRN: [id number] Date of Birth: [birth year] Today's Date: 12/24/2022  Pain: no c/o Skilled Therapeutic Interventions/Progress Updates: Session focused on discharge planning, realistic expectations of herself by discharge date.  Pt stated she is trying hard  but acknowledges how hard therapies are.  Reinforced the impact of emotional, social and spiritual health on overall wellness and continued physical recovery.  Pt stated how important her friends were in her well being and that she was looking forward to visiting with them once home.     Arab 12/24/2022, 3:09 PM

## 2022-12-24 NOTE — Progress Notes (Signed)
Occupational Therapy Session Note  Patient Details  Name: [name] MRN: [id number] Date of Birth: 1951-03-15  Today's Date: 12/24/2022 OT Individual Time: 8921-1941 OT Individual Time Calculation (min): 40 min    Short Term Goals: Week 1:  OT Short Term Goal 1 (Week 1): Pt will complete STS transfer with Mod A + LRAD in preparation for standing ADL tasks. OT Short Term Goal 1 - Progress (Week 1): Met OT Short Term Goal 2 (Week 1): Pt will complete simple bathing activities with consistent Mod A + LRAD. OT Short Term Goal 2 - Progress (Week 1): Met OT Short Term Goal 3 (Week 1): Pt will complete LB dressing with Mod A + LRAD. OT Short Term Goal 3 - Progress (Week 1): Not met  Skilled Therapeutic Interventions/Progress Updates:     Pt received in bed with L knee pain, but improved with bracing  Therapeutic activity Focus of session on transitional movements/weight shifting forward in prep for mobility needed for BADLs/transfers. OT applied knee brace to L knee. Pt uses stedy to transfer to EOM with MIN A for power up in stedy. Prep work EOM with use of ball in front of pt to reach with L hand and roll ball forward getting chest over legs. Sit to stand instedy and in RW with min A in stedy focus on not leveraging back to stand, but to weight shift forward and stack joints. In RW MOD-MAX A with posterior bias d/t poor confidence and unable ot pull on walker like stedy bar.   Pt left at end of session in bed with exit alarm on, call light in reach and all needs met   Therapy Documentation Precautions:  Precautions Precautions: Shoulder Type of Shoulder Precautions: R-shoulder pain with ROM/WB Precaution Comments: History of frequent falls at home. Restrictions Weight Bearing Restrictions: No General:    Therapy/Group: Individual Therapy  Tonny Branch 12/24/2022, 6:51 AM

## 2022-12-24 NOTE — Progress Notes (Signed)
Physical Therapy Session Note  Patient Details  Name: Nitasha Jewel MRN: [id number] Date of Birth: 05-Jan-1951  Today's Date: 12/24/2022 PT Individual Time: 1105-1205 PT Individual Time Calculation (min): 60 min   Short Term Goals: Week 2:  PT Short Term Goal 1 (Week 2): Pt will perform supine<>sit min assist using bed features PT Short Term Goal 1 - Progress (Week 2): Progressing toward goal PT Short Term Goal 2 (Week 2): Pt will perform sit<>stands using LRAD with no more than mod assist consistently PT Short Term Goal 2 - Progress (Week 2): Progressing toward goal PT Short Term Goal 3 (Week 2): Pt will perform bed<>chair transfers using LRAD with no more than mod assist PT Short Term Goal 3 - Progress (Week 2): Progressing toward goal PT Short Term Goal 4 (Week 2): Pt will ambulate at least 42ft using litegait for safety but no BWS using LRAD PT Short Term Goal 4 - Progress (Week 2): Progressing toward goal Week 3:  PT Short Term Goal 1 (Week 3): Pt will perform supine<>sit with minA consistently. PT Short Term Goal 2 (Week 3): Pt will performs sit<>stand using LRAD with modA consistently. PT Short Term Goal 3 (Week 3): Pt will perform bed<>chair transfer consistently with modA PT Short Term Goal 4 (Week 3): Pt will ambulate at least 10' with modA +1 and LRAD.  Skilled Therapeutic Interventions/Progress Updates: Pt presented in w/c agreeable to therapy. Pt agreeable to work on standing from mat. Pt transported to main gym and performed Stedy transfer to mat with minA. Pt set up with Ethelene Hal and performed x 5 Sit to stand with increased rest breaks between bouts for recovery. Pt stood from elevated mat and required heavy minA to stand and max facilitation to lean forward vs pushing into posterior lean. Pt encouraged to lean on [place] walker and work on [employer] with each stand. PTA providing facilitation at hips 3/5 times to encourage weight shifting. Pt intermittently pushing into extension but was  able to correct with assist and increased time 2/5 stands. Pt most successful on last stand and was able to maintain nearly erect posture for ~10 seconds. Discussed with pt next week will continue to discuss about next steps for d/c (PWC/Stedy/Hoyer if needed) and will continue working on stands to hopefully progress to stand pivots remainder of week. Performed Stedy transfer back to w/c in same manner as prior. Pt transported back to room and remained in w/c with belt alarm on, call bell within reach and needs met.      Therapy Documentation Precautions:  Precautions Precautions: Shoulder Type of Shoulder Precautions: R-shoulder pain with ROM/WB Precaution Comments: History of frequent falls at home. Restrictions Weight Bearing Restrictions: No General:   Vital Signs: Therapy Vitals Temp: 98.8 F (37.1 C) Temp Source: Oral Pulse Rate: 82 Resp: 16 BP: (!) 114/56 Patient Position (if appropriate): Sitting Oxygen Therapy SpO2: 98 % O2 Device: Room Air Pain:   Mobility:   Locomotion :    Trunk/Postural Assessment :    Balance:   Exercises:   Other Treatments:      Therapy/Group: Individual Therapy  Alizay Bronkema 12/24/2022, 4:13 PM

## 2022-12-25 DIAGNOSIS — M1712 Unilateral primary osteoarthritis, left knee: Secondary | ICD-10-CM

## 2022-12-25 LAB — GLUCOSE, CAPILLARY
Glucose-Capillary: 230 mg/dL — ABNORMAL HIGH (ref 70–99)
Glucose-Capillary: 234 mg/dL — ABNORMAL HIGH (ref 70–99)
Glucose-Capillary: 216 mg/dL — ABNORMAL HIGH (ref 70–99)
Glucose-Capillary: 178 mg/dL — ABNORMAL HIGH (ref 70–99)

## 2022-12-25 MED ORDER — INSULIN GLARGINE-YFGN 100 UNIT/ML ~~LOC~~ SOLN
16.0000 [IU] | Freq: Every day | SUBCUTANEOUS | Status: DC
  Administered 2022-12-26 – 2022-12-29 (×4): 16 [IU] via SUBCUTANEOUS
  Filled 2022-12-25 (×4): qty 0.16

## 2022-12-25 MED ORDER — INSULIN ASPART 100 UNIT/ML IJ SOLN: 0.0000 [IU] | Freq: Three times a day (TID) | INTRAMUSCULAR | Status: DC

## 2022-12-25 MED ORDER — INSULIN ASPART 100 UNIT/ML IJ SOLN
0.0000 [IU] | Freq: Three times a day (TID) | INTRAMUSCULAR | Status: DC
  Administered 2022-12-25 (×2): 5 [IU] via SUBCUTANEOUS
  Administered 2022-12-26: 3 [IU] via SUBCUTANEOUS
  Administered 2022-12-26: 5 [IU] via SUBCUTANEOUS
  Administered 2022-12-26: 3 [IU] via SUBCUTANEOUS
  Administered 2022-12-27: 8 [IU] via SUBCUTANEOUS
  Administered 2022-12-27: 5 [IU] via SUBCUTANEOUS
  Administered 2022-12-28: 8 [IU] via SUBCUTANEOUS
  Administered 2022-12-28: 5 [IU] via SUBCUTANEOUS
  Administered 2022-12-29: 11 [IU] via SUBCUTANEOUS

## 2022-12-25 NOTE — Progress Notes (Signed)
Occupational Therapy Session Note  Patient Details  Name: [name] MRN: [id number] Date of Birth: 05-15-1951  Today's Date: 12/25/2022 OT Individual Time: 1348-1430 OT Individual Time Calculation (min): 42 min    Short Term Goals: Week 2:  OT Short Term Goal 1 (Week 2): Pt will stand without the stedy with max of one person OT Short Term Goal 1 - Progress (Week 2): Met OT Short Term Goal 2 (Week 2): Pt will complete LB bathing tasks at bed level with min A OT Short Term Goal 2 - Progress (Week 2): Met OT Short Term Goal 3 (Week 2): Pt will stand statically for 5 min in prep for more functional ADL transfers OT Short Term Goal 3 - Progress (Week 2): Met  Skilled Therapeutic Interventions/Progress Updates:    Pt greeted seated in wc after finishing PT session. Pt brought to therapy gym in wc. Worked on UB there-ex using 3 lb and 1 lb dowel rod. 3 sets of 10 bicep curl, seated row, and 1 lb chest press -OT support on R UE. Pt brought to therapy day room and cut out hearts for window mural. Worked on modified reaching and powering through LE's to reach up and place hearts. Unable to get into modified squat.  Pt returned to room and left seated in wc with alarm belt on, call bell in reach and needs met.  Therapy Documentation Precautions:  Precautions Precautions: Shoulder Type of Shoulder Precautions: R-shoulder pain with ROM/WB Precaution Comments: History of frequent falls at home. Restrictions Weight Bearing Restrictions: No  Pain:  DENIES PAIN   Therapy/Group: Individual Therapy  Valma Cava 12/25/2022, 2:15 PM

## 2022-12-25 NOTE — Progress Notes (Signed)
Physical Therapy Session Note  Patient Details  Name: [name] MRN: [id number] Date of Birth: October 21, 1951  Today's Date: 12/25/2022 PT Individual Time: [phone] and 1303-1350 PT Individual Time Calculation (min): 40 min and 47 min  Short Term Goals: Week 3:  PT Short Term Goal 1 (Week 3): Pt will perform supine<>sit with minA consistently. PT Short Term Goal 2 (Week 3): Pt will performs sit<>stand using LRAD with modA consistently. PT Short Term Goal 3 (Week 3): Pt will perform bed<>chair transfer consistently with modA PT Short Term Goal 4 (Week 3): Pt will ambulate at least 10' with modA +1 and LRAD.  Skilled Therapeutic Interventions/Progress Updates: Pt presented in bed agreeable to therapy. Pt used BSC and got dressed with NT. Pt performed supine to sit at EOB with use of bed features with light CGA. Pt was able to scoot to EOB with supervision and verbal cues to lean forward. PTA donned knee brace and shoes total A. Performed Stedy transfer to w/c with minA. Pt noted to have good carryover from PT session yesterday regarding sitting safely. Pt transported to day room and performed Stedy transfer to high/low mat. Pt set up with Lite Gait harness while in [place]. Pt stood with modA and Lite Gait completed stand with some body weight support lifted. Pt then ambulated ~41f with modA and partial body weight. Pt demonstrated fair management of RW and was notably less anxious with this activity. With fatigue pt noted to demonstrate increased difficulty maintaining knee extension and began to sit in harness but could correct when verbally cued. Once completed pt was lowered to w/c with Lite Gait. Pt transported back to room at end of session and remained in w/c with call bell within reach and current needs met.    Tx2: Pt presented in w/c agreeable to therapy. Pt states unrated pain in shoulder, states minimal pain in L knee. Per pt knee pain with weight bearing more tolerable with current brace. Pt  transported to main gym for time management and pt able to perform Stedy transfer with light minA to high/low mat. Pt set up for Sit to stand from elevated mat. Pt was able to perform x 4 stands during session with skilled +2 present for appropriate cues and to facilitate anterior weight shifting. Pt demonstrated improved ability to power up and 2/4 times improved ability to shift weight anteriorly WITHOUT  adjusting feet. Pt continues to have bouts of anxiety that contribute to pt pushing backwards as well. Pt was able to overcome this twice and correct stance but difficult to maintain. Continued education with pt regarding obtaining Stedy at home and introducing PTrappeto which pt appears more amicable to option. [facility] [place]. Pt transported back to room at end of session and remained in w/c with call bell within reach and needs met.      Therapy Documentation Precautions:  Precautions Precautions: Shoulder Type of Shoulder Precautions: R-shoulder pain with ROM/WB Precaution Comments: History of frequent falls at home. Restrictions Weight Bearing Restrictions: No General:   Vital Signs:   Pain:   Mobility:   Locomotion :    Trunk/Postural Assessment :    Balance:   Exercises:   Other Treatments:      Therapy/Group: Individual Therapy  Majesta Leichter 12/25/2022, 12:38 PM

## 2022-12-25 NOTE — Progress Notes (Addendum)
Occupational Therapy Session Note  Patient Details  Name: [name] MRN: [id number] Date of Birth: Sep 29, 1951  Today's Date: 12/25/2022 OT Individual Time: 0920-1014 OT Individual Time Calculation (min): 54 min    Short Term Goals: Week 1:  OT Short Term Goal 1 (Week 1): Pt will complete STS transfer with Mod A + LRAD in preparation for standing ADL tasks. OT Short Term Goal 1 - Progress (Week 1): Met OT Short Term Goal 2 (Week 1): Pt will complete simple bathing activities with consistent Mod A + LRAD. OT Short Term Goal 2 - Progress (Week 1): Met OT Short Term Goal 3 (Week 1): Pt will complete LB dressing with Mod A + LRAD. OT Short Term Goal 3 - Progress (Week 1): Not met  Skilled Therapeutic Interventions/Progress Updates:     Pt received in wc with only mild R shoulder pain. Oral care at sink seated with increased time and encouragement to reach with RUE.  Therapeutic activity Focus of session on functional mobitliy and transitional movmeent in the stedy and outside the stedy.  Ant weight shift training reaching for clothes pins with LUE and placing on board in max ranges outside BOS forward of pt  Beginning of stand addressed with repetitions of w/c push ups with manula facilitation of weight shift forward and foot placement  Last 3rd of standing addressed in stedy seated on flaps with repetitions of weight shift forward and only 2 fingers on stedy bar for confidence to feel anterior translation of femurs and pushing through Les to find upright  3 sit to stands in RW with overall mod-max A d/t posterior bias as well as right lean this date. Better with repetition but pt very reliant on bar to pull up on v pushing from walker   Pt left at end of session in w/c call light in reach and all needs met   Therapy Documentation Precautions:  Precautions Precautions: Shoulder Type of Shoulder Precautions: R-shoulder pain with ROM/WB Precaution Comments: History of frequent  falls at home. Restrictions Weight Bearing Restrictions: No General:    Therapy/Group: Individual Therapy  Tonny Branch 12/25/2022, 6:51 AM

## 2022-12-25 NOTE — Progress Notes (Addendum)
PROGRESS NOTE   Subjective/Complaints: Knee brace is helping her pain. No new concerns or complaints this AM. No dysuria.  Review of Systems  Constitutional:  Negative for chills, fever and malaise/fatigue.  Eyes:        Baseline blind R eye  Respiratory:  Negative for shortness of breath.   Cardiovascular:  Negative for chest pain and palpitations.  Gastrointestinal:  Negative for abdominal pain, constipation, nausea and vomiting.  Genitourinary:  Negative for dysuria, flank pain, frequency (improved), hematuria and urgency.  Musculoskeletal:  Positive for joint pain.  Skin:  Negative for rash.  Neurological:  Positive for dizziness. Negative for headaches.  Psychiatric/Behavioral:  The patient is nervous/anxious.     Objective:   No results found. No results for input(s): "WBC", "HGB", "HCT", "PLT" in the last 72 hours.  No results for input(s): "NA", "K", "CL", "CO2", "GLUCOSE", "BUN", "CREATININE", "CALCIUM" in the last 72 hours.    Shoulder Xray 11/30/22 Postsurgical changes are noted in the proximal right humerus with deformity identified. Underlying bony thorax appears within normal limits. No fracture or dislocation is seen. Stable bone island is noted in the mid humerus. No new focal abnormality is seen.   IMPRESSION: Postsurgical  changes without acute abnormality.     Intake/Output Summary (Last 24 hours) at 12/25/2022 0828 Last data filed at 12/25/2022 0739 Gross per 24 hour  Intake 300 ml  Output --  Net 300 ml         Physical Exam: Vital Signs Blood pressure 136/74, pulse 73, temperature 97.8 F (36.6 C), resp. rate 17, height 5' 7"$  (1.702 m), weight 87.9 kg, SpO2 93 %.  Physical Exam Constitution: Appropriate appearance for age. Sitting in bed, appears comfortable HEENT: R pupil fixed, dilated, blind; L pupil reactive, EOM grossly intact, + disconjugate gaze, MMM Resp: CTAB. NO RRW Cardio:  IIR, normal rate Abdomen: soft, Nondistended. Nontender. +bowel sounds. Psych: very pleasant appropriate Skin: BLE with ichthyosis. RLE mepilex dressing in place. Finger bilateral hand flushed appearing. No appreciable LE edema today.      Neurologic/MsK:   Sensory exam: revealed reduced sensation to light touch over right plantar foot and heel. . Motor exam: Moving all 4 extremities to gravity R shoulder tenderness with palpation and Rom in all directions.  L knee brace in place No abnormal tone noted  Prior Exam      Strength:                RUE: 4/5 SA, 5-/5 EF, 5-/5 EE, 5/5 WE, 5/5 FF, 5/5 FA                 LUE: 5/5 SA, 5/5 EF, 5/5 EE, 5/5 WE, 5/5 FF, 5/5 FA                 RLE: 3+/5 HF, 4+/5 KE, 4/5 DF,  5/5 PF                 LLE:  3-/5 HF, 3+/5 KE, 5/5 DF,  5/5 PF  Coordination: Fine motor coordination was normal.      Assessment/Plan: 1. Functional deficits which require 3+ hours per day of interdisciplinary therapy in  a comprehensive inpatient rehab setting. Physiatrist is providing close team supervision and 24 hour management of active medical problems listed below. Physiatrist and rehab team continue to assess barriers to discharge/monitor patient progress toward functional and medical goals  Care Tool:  Bathing    Body parts bathed by patient: Right arm, Chest, Abdomen, Front perineal area, Right upper leg, Left upper leg, Face, Left arm, Buttocks, Left lower leg, Right lower leg   Body parts bathed by helper: Left arm, Buttocks, Right lower leg, Left lower leg     Bathing assist Assist Level: Contact Guard/Touching assist (shower on BSC)     Upper Body Dressing/Undressing Upper body dressing   What is the patient wearing?: Pull over shirt    Upper body assist Assist Level: Supervision/Verbal cueing    Lower Body Dressing/Undressing Lower body dressing      What is the patient wearing?: Incontinence brief, Pants     Lower body assist Assist for  lower body dressing: Moderate Assistance - Patient 50 - 74% (in the stedy)     [place] for toileting: Moderate Assistance - Patient 50 - 74%     Transfers Chair/bed transfer  Transfers assist     Chair/bed transfer assist level: Dependent - mechanical lift     Locomotion Ambulation   Ambulation assist   Ambulation activity did not occur: Safety/medical concerns          Walk 10 feet activity   Assist  Walk 10 feet activity did not occur: Safety/medical concerns        Walk 50 feet activity   Assist Walk 50 feet with 2 turns activity did not occur: Safety/medical concerns         Walk 150 feet activity   Assist Walk 150 feet activity did not occur: Safety/medical concerns         Walk 10 feet on uneven surface  activity   Assist Walk 10 feet on uneven surfaces activity did not occur: Safety/medical concerns         Wheelchair     Assist Is the patient using a wheelchair?: Yes Type of Wheelchair: Manual    Wheelchair assist level: Dependent - Patient 0% Max wheelchair distance: 150    Wheelchair 50 feet with 2 turns activity    Assist        Assist Level: Dependent - Patient 0%   Wheelchair 150 feet activity     Assist      Assist Level: Dependent - Patient 0%   Blood pressure 136/74, pulse 73, temperature 97.8 F (36.6 C), resp. rate 17, height 5' 7"$  (1.702 m), weight 87.9 kg, SpO2 93 %.  Medical Problem List and Plan: 1. Functional deficits secondary to debility due to acute on chronic HFpEF, atrial fibrillation with RVR             -patient may shower             -ELOS/Goals: 2/21, min A/mod A for gait at least 10-15 feet  -Continue CIR, PT/OT   2.  Antithrombotics: -DVT/anticoagulation:  Lovenox 40 mg daily (No full-dose AC d/t Hx SAH 2007)             -antiplatelet therapy: ASA 325 daily per IM until OP cards f/u 3. Pain Management:  Oxycodone or tramadol prn.  Kpad ordered -encourage use of Voltaren gel 2g QID for OA right shoulder/left knee.  4. Mood/Behavior/Sleep: LCSW to follow for evaluation and support.              -  Melatonin prn--12/05/22 ordered as scheduled 45m QHS, Trazodone PRN   -12/06/22 sleeping improving, continue current regimen -12/12/22 wants melatonin to be changed to PRN, ordered this; advised she will need to request sleep meds if she needs them -12/20/22 improved, cont regimen             -antipsychotic agents: N/A  -Nortriptyline 145mQHS, Neurontin 10067mHS 5. Neuropsych/cognition: This patient is capable of making decisions on her own behalf.  -start xanax 0.25 BID PRN for anxiety -Consulted Psychiatry- psychiatry reviewed her case and case discussed with  TakSheran Favasych recommended continuation current medications encourage therapy and did not feel this issue would benefit from inpatient psych, ok to cancel consult.   -1/31 start low dose duloxetine 58m49miscussed risks and benefits of this medication,may also help with joint pain -2/2 increase duloxetine to 30mg43mly -2/7 increased duloxetine to 60mg 58miety reported to be a little improved with medications 6. Skin/Wound Care: Add lotion to BLE and hands             -routine pressure relief measures.  7. Fluids/Electrolytes/Nutrition: Monitor I/O. Intake improving. Monitor weekly BMPs, next 12/21/22 -May need to stay dry to avoid fluid overload. 8. Acute on chronic diastolic CHF: Strict I/O w/daily weights. -Cardiac diet. Monitor for signs of overload.  -continue Lasix 40mg Q51mldactone 12.5mg QD,33mtoprolol 100mg QD,3mvastatin 40mg QD a68mosartan 50mg QD -m58mor for recurrent hyperkalemia (Losartan dose decreased 01/14) -2/1 A little up from a few days ago but appears stable overall -2/8 weight overall stable, continue to monitor Filed Weights   12/23/22 0351 12/24/22 0414 12/25/22 0349  Weight: 89.5 kg 88.1 kg 87.9 kg     9. A Fib w/RVR: Monitor HR  TID--continue Cardizem 180mg QD and48moprolol 100mg QD 10. 17m BPs with some lability, 130s-160s SBP -12/05/22 monitor for now, Losartan decreased 11/29/22, could consider going back to home dosing if appropriate -1/23 BP has been elevated, will increase losartan to 50mg  -2/8 we58montroled Vitals:   12/21/22 0100 12/21/22 0556 12/21/22 1956 12/22/22 0310  BP: (!) 149/65 (!) 147/77 128/74 138/70   12/22/22 1521 12/22/22 1924 12/23/22 0351 12/23/22 1318  BP: (!) 112/46 124/66 121/74 (!) 103/51   12/23/22 2030 12/24/22 0414 12/24/22 1426 12/25/22 0334  BP: (!) 118/57 (!) 143/61 (!) 114/56 136/74     11.  T1DM: Hgb A1c- 7.4. Monitor BS ac/hs and use SSI for elevated BS             -continue insulin glargline 15 units daily.  -May need to add meal coverage-->used 5- 7units tid prn PTA.  -12/05/22 CBGs 170s-200s, will increase Semglee to 16U QD, monitor -1/24 add 2 units mealtime insulin -1/25 increase mealtime to 4 units insulin, decrease lantus to 14 units, glucose has been higher in the day and lower in the Ams -1/26 decrease mealtime insulin to 3 units due to hypoglycemia, suspect this may have been due to poor PO intake yesterday, she refused semglee yesterday- will change to daytime -12/12/22 CBGs 200s overnight, monitor for now since Semglee adjusted yesterday -1/31 trending a little lower, decrease mealtime insulin dose to 2 units -2/1 decrease semglee to 11 units from 14units, recommend nighttime snack tonight  -2/2 decreased to moderate resistance correctional SSI -12/19/22 CBGs widely varying, AM fasting 248, if continuing then could consider retitrating semglee, monitor for now -12/20/22 daytime CBGs yesterday up a bit but AM fasting 149 today, will increase mealtime novolog to 3U again  and leave Semglee as is; monitor closely -12/21/22 increase semglee to 13u -2/9 will adjust semglee to 16units, stop mealtime- per Gilberto Better, discussed with pharmacy  CBG (last 3)  Recent Labs     12/24/22 1702 12/24/22 2102 12/25/22 0615  GLUCAP 131* 72 230*     12. Chronic bilateral shoulder and left knee pain: Oxycodone prn -Refusing voltaren gel for local measures-->encourage use             -Aquathermia for local measures.              -L knee: Follows OP ortho, no benefit from injections in the past  -L knee pain improved with knee brace  13. Upper respiratory infection, Nocturnal hypoxia: Question sleep apnea.  -Encourage pulmonary hygiene -Respiratory viral panel negative x2 inpatient -Remains with cough, intermittent SOB - add PRN inhaler, monitor  14.  Pre-renal azotemia: Likely due to diuresis. BUN up but SCr improving.   -Continue to monitor.  -2/5 Bun and Cr stable  15. Overactive bladder, incontinence: hx of, on Myrbetriq 73m QD; now off purewick -12/05/22 having increased frequency and incontinence, ordered U/A, UCx, and scheduled voiding program -12/06/22 U/A c/w UTI, see #16 -Continue myrbetriq,she is mostly continent with occasional incontinent episodes -12/19/22 continues to have improvement, cont regimen  16. UTI 12/06/22:  -U/A c/w UTI, ordered Macrobid 1075mBID x5 days (12/06/22>>12/10/22 last dose), UCx pending- Ecoli sensitive to macrobid, completed course, symptoms resolved -1/7 cloudy urine yesterday, asymptomatic, continue to monitor 1/9 denies symptoms of UTI  17.  Hypokalemia  -304mx2, recheck tomorrow  -K+ up to 3.6, losartan increased today, Recheck tomorrow -1/25 K+ now elevated hyperkalemia at 5.4, lokelma 5g  x1, stop potassium supplementation, discussed with pharmacy, recheck tomorrow  -2/5 K+ 3.6, will supplement with 46m29moday  18. Dizziness -1/22 Will start meclizine 12.5mg 68m, check orthostatic VS -She has HX of vertigo going back to 2013, seen by Dr. Wong [facility], felt to be related to vestibulopathy -1/30 fall yesterday but no injury -2/2 increase meclizine to 25mg 27mprn  19. Constipation: reported as chronic  -Schedule  miralax QD, PRN dulcolax supp and fleet enema -1/26 had vomiting with  sorbitol yesterday, continue miralax daily, she had large BM yesterday -1/29 miralax increased to BID -1/30 she declines stronger medication at this time,order sorbitol PRN -2/1 large bm yesterday, improved  -12/19/22 now having increased stool frequency, wants to change Miralax to BID PRN rather than scheduled; also wants Colace 100mg B55mRN in case she needs it; knows to ask -2/9 LBM today, regular bms  20. R shoulder pain  -Xray on 1/15 with postsurgical changes, continue voltaren gel, Kpad  -1/31 Duloxetine started as this may help with joint pain also -2/2 Duloxetine increase to 30mg -257mncreased duloxetine to 60mg   L13m21 days A FACE TO FACE EVALUATION WAS PERFORMED  H[employer] Console ShtrJennye Boroughs, 8:28 AM

## 2022-12-26 LAB — GLUCOSE, CAPILLARY
Glucose-Capillary: 174 mg/dL — ABNORMAL HIGH (ref 70–99)
Glucose-Capillary: 232 mg/dL — ABNORMAL HIGH (ref 70–99)
Glucose-Capillary: 173 mg/dL — ABNORMAL HIGH (ref 70–99)
Glucose-Capillary: 150 mg/dL — ABNORMAL HIGH (ref 70–99)

## 2022-12-26 NOTE — Progress Notes (Signed)
PROGRESS NOTE   Subjective/Complaints: Feeling good today. Slept ok, had a BM this morning, urinating well, and pain is well managed. No other complaints today.   Review of Systems  Constitutional:  Negative for chills, fever and malaise/fatigue.  Eyes:        Baseline blind R eye  Respiratory:  Negative for shortness of breath.   Cardiovascular:  Negative for chest pain and palpitations.  Gastrointestinal:  Negative for abdominal pain, constipation, nausea and vomiting.  Genitourinary:  Negative for dysuria, flank pain, frequency (improved), hematuria and urgency.  Musculoskeletal:  Positive for joint pain.  Skin:  Negative for rash.  Neurological:  Positive for dizziness. Negative for headaches.  Psychiatric/Behavioral:  The patient is nervous/anxious.     Objective:   No results found. No results for input(s): "WBC", "HGB", "HCT", "PLT" in the last 72 hours.  No results for input(s): "NA", "K", "CL", "CO2", "GLUCOSE", "BUN", "CREATININE", "CALCIUM" in the last 72 hours.    Shoulder Xray 11/30/22 Postsurgical changes are noted in the proximal right humerus with deformity identified. Underlying bony thorax appears within normal limits. No fracture or dislocation is seen. Stable bone island is noted in the mid humerus. No new focal abnormality is seen.   IMPRESSION: Postsurgical  changes without acute abnormality.     Intake/Output Summary (Last 24 hours) at 12/26/2022 0708 Last data filed at 12/25/2022 1902 Gross per 24 hour  Intake 600 ml  Output --  Net 600 ml         Physical Exam: Vital Signs Blood pressure 130/72, pulse 85, temperature 97.9 F (36.6 C), temperature source Oral, resp. rate 18, height 5' 7"$  (1.702 m), weight 86.5 kg, SpO2 94 %.  Physical Exam Constitution: Appropriate appearance for age. Sitting in bed, appears comfortable HEENT: R pupil fixed, dilated, blind; L pupil reactive, EOM  grossly intact, + disconjugate gaze, MMM Resp: CTAB. NO RRW Cardio: IIR, normal rate Abdomen: soft, Nondistended. Nontender. +bowel sounds. Psych: very pleasant, appropriate Skin: BLE with ichthyosis. RLE mepilex dressing in place. Finger bilateral hand flushed appearing. Trace BLE edema today.   Neurologic/MsK:   Sensory exam: revealed reduced sensation to light touch over right plantar foot and heel. . Motor exam: Moving all 4 extremities to gravity R shoulder tenderness with palpation and Rom in all directions.  L knee brace in place No abnormal tone noted  Prior Exam      Strength:                RUE: 4/5 SA, 5-/5 EF, 5-/5 EE, 5/5 WE, 5/5 FF, 5/5 FA                 LUE: 5/5 SA, 5/5 EF, 5/5 EE, 5/5 WE, 5/5 FF, 5/5 FA                 RLE: 3+/5 HF, 4+/5 KE, 4/5 DF,  5/5 PF                 LLE:  3-/5 HF, 3+/5 KE, 5/5 DF,  5/5 PF  Coordination: Fine motor coordination was normal.      Assessment/Plan: 1. Functional deficits which require 3+ hours  per day of interdisciplinary therapy in a comprehensive inpatient rehab setting. Physiatrist is providing close team supervision and 24 hour management of active medical problems listed below. Physiatrist and rehab team continue to assess barriers to discharge/monitor patient progress toward functional and medical goals  Care Tool:  Bathing    Body parts bathed by patient: Right arm, Chest, Abdomen, Front perineal area, Right upper leg, Left upper leg, Face, Left arm, Buttocks, Left lower leg, Right lower leg   Body parts bathed by helper: Left arm, Buttocks, Right lower leg, Left lower leg     Bathing assist Assist Level: Contact Guard/Touching assist (shower on BSC)     Upper Body Dressing/Undressing Upper body dressing   What is the patient wearing?: Pull over shirt    Upper body assist Assist Level: Supervision/Verbal cueing    Lower Body Dressing/Undressing Lower body dressing      What is the patient wearing?:  Incontinence brief, Pants     Lower body assist Assist for lower body dressing: Moderate Assistance - Patient 50 - 74% (in the stedy)     [place] for toileting: Moderate Assistance - Patient 50 - 74%     Transfers Chair/bed transfer  Transfers assist     Chair/bed transfer assist level: Dependent - mechanical lift     Locomotion Ambulation   Ambulation assist   Ambulation activity did not occur: Safety/medical concerns          Walk 10 feet activity   Assist  Walk 10 feet activity did not occur: Safety/medical concerns        Walk 50 feet activity   Assist Walk 50 feet with 2 turns activity did not occur: Safety/medical concerns         Walk 150 feet activity   Assist Walk 150 feet activity did not occur: Safety/medical concerns         Walk 10 feet on uneven surface  activity   Assist Walk 10 feet on uneven surfaces activity did not occur: Safety/medical concerns         Wheelchair     Assist Is the patient using a wheelchair?: Yes Type of Wheelchair: Manual    Wheelchair assist level: Dependent - Patient 0% Max wheelchair distance: 150    Wheelchair 50 feet with 2 turns activity    Assist        Assist Level: Dependent - Patient 0%   Wheelchair 150 feet activity     Assist      Assist Level: Dependent - Patient 0%   Blood pressure 130/72, pulse 85, temperature 97.9 F (36.6 C), temperature source Oral, resp. rate 18, height 5' 7"$  (1.702 m), weight 86.5 kg, SpO2 94 %.  Medical Problem List and Plan: 1. Functional deficits secondary to debility due to acute on chronic HFpEF, atrial fibrillation with RVR             -patient may shower             -ELOS/Goals: 2/21, min A/mod A for gait at least 10-15 feet  -Continue CIR, PT/OT   2.  Antithrombotics: -DVT/anticoagulation:  Lovenox 40 mg daily (No full-dose AC d/t Hx SAH 2007)             -antiplatelet therapy: ASA  325 daily per IM until OP cards f/u 3. Pain Management:  Oxycodone or tramadol prn. Kpad ordered -encourage use of Voltaren gel 2g QID for OA right shoulder/left knee.  4. Mood/Behavior/Sleep: LCSW to follow for evaluation and support.              -Melatonin prn--12/05/22 ordered as scheduled 67m QHS, Trazodone PRN   -12/06/22 sleeping improving, continue current regimen -12/12/22 wants melatonin to be changed to PRN, ordered this; advised she will need to request sleep meds if she needs them -12/26/22 improved, cont regimen             -antipsychotic agents: N/A  -Nortriptyline 152mQHS, Neurontin 100104mHS 5. Neuropsych/cognition: This patient is capable of making decisions on her own behalf.  -start xanax 0.25 BID PRN for anxiety -Consulted Psychiatry- psychiatry reviewed her case and case discussed with  TakSheran Favasych recommended continuation current medications encourage therapy and did not feel this issue would benefit from inpatient psych, ok to cancel consult.   -1/31 start low dose duloxetine 86m69miscussed risks and benefits of this medication,may also help with joint pain -2/2 increase duloxetine to 30mg70mly -2/7 increased duloxetine to 60mg 60m2/10/24 improving, cont regimen 6. Skin/Wound Care: Add lotion to BLE and hands             -routine pressure relief measures.  7. Fluids/Electrolytes/Nutrition: Monitor I/O. Intake improving. Monitor weekly BMPs, next 12/28/22 -May need to stay dry to avoid fluid overload. 8. Acute on chronic diastolic CHF: Strict I/O w/daily weights. -Cardiac diet. Monitor for signs of overload.  -continue Lasix 40mg Q69mldactone 12.5mg QD,49mtoprolol 100mg QD,64mvastatin 40mg QD a43mosartan 50mg QD -m5mor for recurrent hyperkalemia (Losartan dose decreased 01/14) -2/1 A little up from a few days ago but appears stable overall -12/26/22 weight overall stable, continue to monitor Filed Weights   12/24/22 0414 12/25/22 0349 12/26/22 0511   Weight: 88.1 kg 87.9 kg 86.5 kg     9. A Fib w/RVR: Monitor HR TID--continue Cardizem 180mg QD and60moprolol 100mg QD 10. 48m BPs with some lability, 130s-160s SBP -12/05/22 monitor for now, Losartan decreased 11/29/22, could consider going back to home dosing if appropriate -1/23 BP has been elevated, will increase losartan to 50mg  -2/10/29mll controled Vitals:   12/22/22 0310 12/22/22 1521 12/22/22 1924 12/23/22 0351  BP: 138/70 (!) 112/46 124/66 121/74   12/23/22 1318 12/23/22 2030 12/24/22 0414 12/24/22 1426  BP: (!) 103/51 (!) 118/57 (!) 143/61 (!) 114/56   12/25/22 0334 12/25/22 1443 12/25/22 2008 12/26/22 0324  BP: 136/74 121/73 (!) 137/94 130/72     11.  T1DM: Hgb A1c- 7.4. Monitor BS ac/hs and use SSI for elevated BS             -continue insulin glargline 15 units daily.  -May need to add meal coverage-->used 5- 7units tid prn PTA.  -12/05/22 CBGs 170s-200s, will increase Semglee to 16U QD, monitor -1/24 add 2 units mealtime insulin -1/25 increase mealtime to 4 units insulin, decrease lantus to 14 units, glucose has been higher in the day and lower in the Ams -1/26 decrease mealtime insulin to 3 units due to hypoglycemia, suspect this may have been due to poor PO intake yesterday, she refused semglee yesterday- will change to daytime -12/12/22 CBGs 200s overnight, monitor for now since Semglee adjusted yesterday -1/31 trending a little lower, decrease mealtime insulin dose to 2 units -2/1 decrease semglee to 11 units from 14units, recommend nighttime snack tonight  -2/2 decreased to moderate resistance correctional SSI -12/19/22 CBGs widely varying, AM fasting 248, if continuing then could consider retitrating semglee, monitor for now -12/20/22 daytime CBGs  yesterday up a bit but AM fasting 149 today, will increase mealtime novolog to 3U again and leave Semglee as is; monitor closely -12/21/22 increase semglee to 13u -2/9 will adjust semglee to 16units, stop mealtime- per pharm  recs, discussed with pharmacy -12/26/22 variable CBGs but trending down; cont regimen and monitor  CBG (last 3)  Recent Labs    12/25/22 1642 12/25/22 2108 12/26/22 0548  GLUCAP 216* 178* 174*      12. Chronic bilateral shoulder and left knee pain: Oxycodone prn -Refusing voltaren gel for local measures-->encourage use             -Aquathermia for local measures.              -L knee: Follows OP ortho, no benefit from injections in the past  -L knee pain improved with knee brace  13. Upper respiratory infection, Nocturnal hypoxia: Question sleep apnea.  -Encourage pulmonary hygiene -Respiratory viral panel negative x2 inpatient -Remains with cough, intermittent SOB - add PRN inhaler, monitor  14.  Pre-renal azotemia: Likely due to diuresis. BUN up but SCr improving.   -Continue to monitor.  -2/5 Bun and Cr stable, monitor with next labs 12/28/22  15. Overactive bladder, incontinence: hx of, on Myrbetriq 64m QD; now off purewick -12/05/22 having increased frequency and incontinence, ordered U/A, UCx, and scheduled voiding program -12/06/22 U/A c/w UTI, see #16 -Continue myrbetriq,she is mostly continent with occasional incontinent episodes -12/26/22 continues to have improvement, cont regimen  16. UTI 12/06/22:  -U/A c/w UTI, ordered Macrobid 1026mBID x5 days (12/06/22>>12/10/22 last dose), UCx pending- Ecoli sensitive to macrobid, completed course, symptoms resolved -2/7 cloudy urine yesterday, asymptomatic, continue to monitor -2/9 denies symptoms of UTI  17.  Hypokalemia  -3072mx2, recheck tomorrow  -K+ up to 3.6, losartan increased today, Recheck tomorrow -1/25 K+ now elevated hyperkalemia at 5.4, lokelma 5g  x1, stop potassium supplementation, discussed with pharmacy, recheck tomorrow -2/5 K+ 3.6, will supplement with 33m25moday, monitor with next labs 12/28/22  18. Dizziness -1/22 Will start meclizine 12.5mg 37m, check orthostatic VS -She has HX of vertigo going back to  2013, seen by Dr. Wong [facility], felt to be related to vestibulopathy -1/30 fall yesterday but no injury -2/2 increase meclizine to 25mg 55mprn  19. Constipation: reported as chronic  -Schedule miralax QD, PRN dulcolax supp and fleet enema -1/26 had vomiting with  sorbitol yesterday, continue miralax daily, she had large BM yesterday -1/29 miralax increased to BID -1/30 she declines stronger medication at this time,order sorbitol PRN -2/1 large bm yesterday, improved  -12/19/22 now having increased stool frequency, wants to change Miralax to BID PRN rather than scheduled; also wants Colace 100mg B33mRN in case she needs it; knows to ask -2/10 LBM today, cont regimen  20. R shoulder pain  -Xray on 1/15 with postsurgical changes, continue voltaren gel, Kpad  -1/31 Duloxetine started as this may help with joint pain also -2/2 Duloxetine increase to 30mg -225mncreased duloxetine to 60mg   L49m22 days A FACE TO FACE [email or web address], 7:08 AM

## 2022-12-27 LAB — GLUCOSE, CAPILLARY
Glucose-Capillary: 117 mg/dL — ABNORMAL HIGH (ref 70–99)
Glucose-Capillary: 405 mg/dL — ABNORMAL HIGH (ref 70–99)
Glucose-Capillary: 291 mg/dL — ABNORMAL HIGH (ref 70–99)
Glucose-Capillary: 295 mg/dL — ABNORMAL HIGH (ref 70–99)
Glucose-Capillary: 208 mg/dL — ABNORMAL HIGH (ref 70–99)

## 2022-12-27 NOTE — Progress Notes (Signed)
Occupational Therapy Session Note  Patient Details  Name: [name] MRN: [id number] Date of Birth: Feb 12, 1951  Today's Date: 12/27/2022 OT Individual Time: 1405-1505 OT Individual Time Calculation (min): 60 min    Short Term Goals: Week 3:  OT Short Term Goal 1 (Week 3): Pt will stand consistently in the stedy with min A OT Short Term Goal 2 (Week 3): Pt will complete bed mobilty rolling R and L with min A OT Short Term Goal 3 (Week 3): Pt will complete LB bathing at bed level with min A  Skilled Therapeutic Interventions/Progress Updates:    Pt received sitting with no c/o pain, agreeable to OT session. She was in the power w/c and first half of session was focused on power w/c navigation to simulate household propulsion. She went through a series of cones to simulate going around tight corners- with min-mod cueing/instruction overall. Pt then transferred to the mat via the stedy with min A to stand from the slightly elevated power chair. She completed multiple trials of sit <> stand from EOM with no more than mod A to power up but difficulty achieving full anterior weight shift or bringing shoulders/trunk over feet- strong posterior bias remains. She completed 5x trials with rest break between. Then switched over to a stedy where she worked on mini squats to strengthen posterior chain, 3x10 repetitions with CGA-min A as she fatigued. She returned to her w/c via stedy. She was left sitting up in the power w/c with all needs met.    Therapy Documentation Precautions:  Precautions Precautions: Shoulder Type of Shoulder Precautions: R-shoulder pain with ROM/WB Precaution Comments: History of frequent falls at home. Restrictions Weight Bearing Restrictions: No  Therapy/Group: Individual Therapy  Curtis Sites 12/27/2022, 6:57 AM

## 2022-12-27 NOTE — Progress Notes (Signed)
PROGRESS NOTE   Subjective/Complaints: Doing well again today, slept well, pain doing better, had a BM today, and urinating well.  Mentions that she has some occasional dizzy spells, which has been a chronic issue. Hasn't used Meclizine because she reports that Dramamine makes her very sleepy and she doesn't want to be oversedated for therapies. She is willing to try it when she's not doing therapy. It's transient, no other associated symptoms.  Denies other complaints.   Review of Systems  Constitutional:  Negative for chills, fever and malaise/fatigue.  Eyes:        Baseline blind R eye  Respiratory:  Negative for shortness of breath.   Cardiovascular:  Negative for chest pain and palpitations.  Gastrointestinal:  Negative for abdominal pain, constipation, nausea and vomiting.  Genitourinary:  Negative for dysuria, frequency, hematuria and urgency.  Musculoskeletal:  Positive for joint pain.  Skin:  Negative for rash.  Neurological:  Positive for dizziness (transient episodes). Negative for headaches.  Psychiatric/Behavioral:  The patient is nervous/anxious.     Objective:   No results found. No results for input(s): "WBC", "HGB", "HCT", "PLT" in the last 72 hours.  No results for input(s): "NA", "K", "CL", "CO2", "GLUCOSE", "BUN", "CREATININE", "CALCIUM" in the last 72 hours.    Shoulder Xray 11/30/22 Postsurgical changes are noted in the proximal right humerus with deformity identified. Underlying bony thorax appears within normal limits. No fracture or dislocation is seen. Stable bone island is noted in the mid humerus. No new focal abnormality is seen.   IMPRESSION: Postsurgical  changes without acute abnormality.     Intake/Output Summary (Last 24 hours) at 12/27/2022 0714 Last data filed at 12/26/2022 1841 Gross per 24 hour  Intake 440 ml  Output --  Net 440 ml         Physical Exam: Vital Signs Blood  pressure (!) 141/62, pulse 80, temperature 97.7 F (36.5 C), temperature source Oral, resp. rate 16, height 5' 7"$  (1.702 m), weight 86 kg, SpO2 96 %.  Physical Exam Constitution: Appropriate appearance for age. Sitting in w/c, appears comfortable HEENT: R pupil fixed, dilated, blind; L pupil reactive, EOM grossly intact, + disconjugate gaze, MMM Resp: CTAB. NO RRW Cardio: IIR, normal rate Abdomen: soft, Nondistended. Nontender. +bowel sounds. Psych: very pleasant, appropriate Skin: BLE with ichthyosis. RLE mepilex dressing in place. Finger bilateral hand flushed appearing. Trace BLE edema today.   Neurologic/MsK:   Sensory exam: revealed reduced sensation to light touch over right plantar foot and heel. . Motor exam: Moving all 4 extremities to gravity R shoulder tenderness with palpation and Rom in all directions.  L knee brace in place No abnormal tone noted  Prior Exam      Strength:                RUE: 4/5 SA, 5-/5 EF, 5-/5 EE, 5/5 WE, 5/5 FF, 5/5 FA                 LUE: 5/5 SA, 5/5 EF, 5/5 EE, 5/5 WE, 5/5 FF, 5/5 FA                 RLE: 3+/5 HF, 4+/5 KE,  4/5 DF,  5/5 PF                 LLE:  3-/5 HF, 3+/5 KE, 5/5 DF,  5/5 PF  Coordination: Fine motor coordination was normal.      Assessment/Plan: 1. Functional deficits which require 3+ hours per day of interdisciplinary therapy in a comprehensive inpatient rehab setting. Physiatrist is providing close team supervision and 24 hour management of active medical problems listed below. Physiatrist and rehab team continue to assess barriers to discharge/monitor patient progress toward functional and medical goals  Care Tool:  Bathing    Body parts bathed by patient: Right arm, Chest, Abdomen, Front perineal area, Right upper leg, Left upper leg, Face, Left arm, Buttocks, Left lower leg, Right lower leg   Body parts bathed by helper: Left arm, Buttocks, Right lower leg, Left lower leg     Bathing assist Assist Level: Contact  Guard/Touching assist (shower on BSC)     Upper Body Dressing/Undressing Upper body dressing   What is the patient wearing?: Pull over shirt    Upper body assist Assist Level: Supervision/Verbal cueing    Lower Body Dressing/Undressing Lower body dressing      What is the patient wearing?: Incontinence brief, Pants     Lower body assist Assist for lower body dressing: Moderate Assistance - Patient 50 - 74% (in the stedy)     Mehama for toileting: Moderate Assistance - Patient 50 - 74%     Transfers Chair/bed transfer  Transfers assist     Chair/bed transfer assist level: Dependent - mechanical lift     Locomotion Ambulation   Ambulation assist   Ambulation activity did not occur: Safety/medical concerns          Walk 10 feet activity   Assist  Walk 10 feet activity did not occur: Safety/medical concerns        Walk 50 feet activity   Assist Walk 50 feet with 2 turns activity did not occur: Safety/medical concerns         Walk 150 feet activity   Assist Walk 150 feet activity did not occur: Safety/medical concerns         Walk 10 feet on uneven surface  activity   Assist Walk 10 feet on uneven surfaces activity did not occur: Safety/medical concerns         Wheelchair     Assist Is the patient using a wheelchair?: Yes Type of Wheelchair: Manual    Wheelchair assist level: Dependent - Patient 0% Max wheelchair distance: 150    Wheelchair 50 feet with 2 turns activity    Assist        Assist Level: Dependent - Patient 0%   Wheelchair 150 feet activity     Assist      Assist Level: Dependent - Patient 0%   Blood pressure (!) 141/62, pulse 80, temperature 97.7 F (36.5 C), temperature source Oral, resp. rate 16, height 5' 7"$  (1.702 m), weight 86 kg, SpO2 96 %.  Medical Problem List and Plan: 1. Functional deficits secondary to debility due to acute on chronic  HFpEF, atrial fibrillation with RVR             -patient may shower             -ELOS/Goals: 2/21, min A/mod A for gait at least 10-15 feet  -Continue CIR, PT/OT   2.  Antithrombotics: -DVT/anticoagulation:  Lovenox 40 mg daily (  No full-dose AC d/t Hx SAH 2007)             -antiplatelet therapy: ASA 325 daily per IM until OP cards f/u 3. Pain Management:  Oxycodone or tramadol prn. Kpad ordered -encourage use of Voltaren gel 2g QID for OA right shoulder/left knee.  4. Mood/Behavior/Sleep: LCSW to follow for evaluation and support.              -Melatonin prn--12/05/22 ordered as scheduled 61m QHS, Trazodone PRN   -12/06/22 sleeping improving, continue current regimen -12/12/22 wants melatonin to be changed to PRN, ordered this; advised she will need to request sleep meds if she needs them -12/27/22 improved, cont regimen             -antipsychotic agents: N/A  -Nortriptyline 118mQHS, Neurontin 10029mHS 5. Neuropsych/cognition: This patient is capable of making decisions on her own behalf.  -start xanax 0.25 BID PRN for anxiety -Consulted Psychiatry- psychiatry reviewed her case and case discussed with  TakSheran Favasych recommended continuation current medications encourage therapy and did not feel this issue would benefit from inpatient psych, ok to cancel consult.   -1/31 start low dose duloxetine 9m82miscussed risks and benefits of this medication,may also help with joint pain -2/2 increase duloxetine to 30mg29mly -2/7 increased duloxetine to 60mg 38m2/11/24 improving, cont regimen 6. Skin/Wound Care: Add lotion to BLE and hands             -routine pressure relief measures.  7. Fluids/Electrolytes/Nutrition: Monitor I/O. Intake improving. Monitor weekly BMPs, next 12/28/22 -May need to stay dry to avoid fluid overload. 8. Acute on chronic diastolic CHF: Strict I/O w/daily weights. -Cardiac diet. Monitor for signs of overload.  -continue Lasix 40mg Q83mldactone 12.5mg QD,26metoprolol 100mg QD,42mvastatin 40mg QD a32mosartan 50mg QD -m56mor for recurrent hyperkalemia (Losartan dose decreased 01/14) -2/1 A little up from a few days ago but appears stable overall -12/27/22 weight overall stable, continue to monitor Filed Weights   12/25/22 0349 12/26/22 0511 12/27/22 0500  Weight: 87.9 kg 86.5 kg 86 kg     9. A Fib w/RVR: Monitor HR TID--continue Cardizem 180mg QD and31moprolol 100mg QD  10.75m: BPs with some lability, 130s-160s SBP -12/05/22 monitor for now, Losartan decreased 11/29/22, could consider going back to home dosing if appropriate -1/23 BP has been elevated, will increase losartan to 50mg  -2/11/227mll controlled overall but some slight variability, monitor Vitals:   12/23/22 0351 12/23/22 1318 12/23/22 2030 12/24/22 0414  BP: 121/74 (!) 103/51 (!) 118/57 (!) 143/61   12/24/22 1426 12/25/22 0334 12/25/22 1443 12/25/22 2008  BP: (!) 114/56 136/74 121/73 (!) 137/94   12/26/22 0324 12/26/22 1416 12/26/22 1917 12/27/22 0455  BP: 130/72 126/63 (!) 140/73 (!) 141/62     11.  T1DM: Hgb A1c- 7.4. Monitor BS ac/hs and use SSI for elevated BS             -continue insulin glargline 15 units daily.  -May need to add meal coverage-->used 5- 7units tid prn PTA.  -12/05/22 increase Semglee to 16U QD -1/24 add 2 units mealtime insulin -1/25 increase mealtime to 4 units insulin, decrease lantus to 14 units -1/26 decrease mealtime insulin to 3 units due to hypoglycemia, semglee to AM -1/31 trending a little lower, decrease mealtime insulin dose to 2 units -2/1 decrease semglee to 11 units from 14units, recommend nighttime snack -2/2 decreased to moderate resistance correctional SSI -12/20/22 increase mealtime novolog  to 3U again and leave Semglee as is -12/21/22 increase semglee to 13u -2/9 will adjust semglee to 16units, stop mealtime- per pharm recs -12/27/22 CBGs trending down; cont regimen and monitor  CBG (last 3)  Recent Labs    12/26/22 1706  12/26/22 2120 12/27/22 0559  GLUCAP 173* 150* 117*      12. Chronic bilateral shoulder and left knee pain: Oxycodone prn -Refusing voltaren gel for local measures-->encourage use             -Aquathermia for local measures.              -L knee: Follows OP ortho, no benefit from injections in the past  -L knee pain improved with knee brace  13. Upper respiratory infection, Nocturnal hypoxia: Question sleep apnea.  -Encourage pulmonary hygiene -Respiratory viral panel negative x2 inpatient -Remains with cough, intermittent SOB - add PRN inhaler, monitor  14.  Pre-renal azotemia: Likely due to diuresis. BUN up but SCr improving.   -Continue to monitor.  -2/5 Bun and Cr stable, monitor with next labs 12/28/22  15. Overactive bladder, incontinence: hx of, on Myrbetriq 38m QD; now off purewick -12/05/22 having increased frequency and incontinence, ordered U/A, UCx, and scheduled voiding program -12/06/22 U/A c/w UTI, see #16 -Continue myrbetriq,she is mostly continent with occasional incontinent episodes -12/27/22 continues to have improvement, cont regimen  16. UTI 12/06/22:  -U/A c/w UTI, ordered Macrobid 1046mBID x5 days (12/06/22>>12/10/22 last dose), UCx pending- Ecoli sensitive to macrobid, completed course, symptoms resolved -2/7 cloudy urine yesterday, asymptomatic, continue to monitor -2/9 denies symptoms of UTI  17.  Hypokalemia  -3064mx2, recheck tomorrow  -K+ up to 3.6, losartan increased today, Recheck tomorrow -1/25 K+ now elevated hyperkalemia at 5.4, lokelma 5g  x1, stop potassium supplementation, discussed with pharmacy, recheck tomorrow -2/5 K+ 3.6, will supplement with 69m52moday, monitor with next labs 12/28/22  18. Dizziness -1/22 Will start meclizine 12.5mg 90m, check orthostatic VS -She has HX of vertigo going back to 2013, seen by Dr. Wong [facility], felt to be related to vestibulopathy -1/30 fall yesterday but no injury -2/2 increase meclizine to 25mg 61m prn -12/27/22 mentions occasional transient dizziness like it's been before, no new/worsening symptoms, hasn't tried meclizine because dramamine has made her drowsy-- will try meclizine, if 25mg t3medating then could try half dose again-- monitor.   19. Constipation: reported as chronic  -Schedule miralax QD, PRN dulcolax supp and fleet enema -1/26 had vomiting with  sorbitol yesterday, continue miralax daily, she had large BM yesterday -1/29 miralax increased to BID -1/30 she declines stronger medication at this time,order sorbitol PRN -2/1 large bm yesterday, improved  -12/19/22 now having increased stool frequency, wants to change Miralax to BID PRN rather than scheduled; also wants Colace 100mg BI18mN in case she needs it; knows to ask -12/27/22 LBM today, cont regimen  20. R shoulder pain  -Xray on 1/15 with postsurgical changes, continue voltaren gel, Kpad  -1/31 Duloxetine started as this may help with joint pain also -2/2 Duloxetine increase to 30mg -2/9mcreased duloxetine to 60mg   LO79m3 days A FACE TO FACE EVALUColfax, 7:14 AM

## 2022-12-27 NOTE — Progress Notes (Signed)
Physical Therapy Session Note  Patient Details  Name: [name] MRN: [id number] Date of Birth: Apr 18, 1951  Today's Date: 12/27/2022 PT Individual Time: 0950-1100 PT Individual Time Calculation (min): 70 min   Short Term Goals: Week 3:  PT Short Term Goal 1 (Week 3): Pt will perform supine<>sit with minA consistently. PT Short Term Goal 2 (Week 3): Pt will performs sit<>stand using LRAD with modA consistently. PT Short Term Goal 3 (Week 3): Pt will perform bed<>chair transfer consistently with modA PT Short Term Goal 4 (Week 3): Pt will ambulate at least 10' with modA +1 and LRAD.  Skilled Therapeutic Interventions/Progress Updates: Pt presented in w/c agreeable to therapy. Pt denies pain at rest. PTA donned shoes and L knee brace total A for time management. Pt transported to main gym and performed Stedy transfer to high/low mat. Pt then performed block practice Sit to stand from 22in height mat. Pt was able to consistently perform Sit to stand with overall minA but required increased time to correct posterior lean and was able to maintain stand during each bout ~20 seconds. Pt then attempted to perform weight shifting but was unable to perform successfully. Pt overall completed x 5 stands in this manner but unfortunately was unable to initiate gait. PTA then obtained [place] and performed Stedy transfer with pt requiring minA to power up. Foot plates and head rest adjusted to pt's comfort. Pt instructed in use of joystick, was demonstrated how has zero degree turns, and how to power on/off. Pt was then able to navigate chair back to room with supervision and was able to turn chair and reverse to park next to bed. At bedside PTA demonstrated use of leg rests, tilt, and recline features. PTA also explained to pt that trial of PWC does not mean will not continue to work on standing, pivots, and ambulation. Pt voiced understanding. Pt left in [place] (with chair turned off) with call bell within reach and needs  met.      Therapy Documentation Precautions:  Precautions Precautions: Shoulder Type of Shoulder Precautions: R-shoulder pain with ROM/WB Precaution Comments: History of frequent falls at home. Restrictions Weight Bearing Restrictions: No General:   Vital Signs:  Pain:   Mobility:   Locomotion :    Trunk/Postural Assessment :    Balance:   Exercises:   Other Treatments:      Therapy/Group: Individual Therapy  Yamilette Garretson 12/27/2022, 4:26 PM

## 2022-12-28 DIAGNOSIS — K5901 Slow transit constipation: Secondary | ICD-10-CM

## 2022-12-28 DIAGNOSIS — E669 Obesity, unspecified: Secondary | ICD-10-CM

## 2022-12-28 LAB — BASIC METABOLIC PANEL
Anion gap: 11 (ref 5–15)
BUN: 16 mg/dL (ref 8–23)
CO2: 28 mmol/L (ref 22–32)
Calcium: 9.9 mg/dL (ref 8.9–10.3)
Chloride: 97 mmol/L — ABNORMAL LOW (ref 98–111)
Creatinine, Ser: 0.75 mg/dL (ref 0.44–1.00)
GFR, Estimated: >60 mL/min (ref >60)
Glucose, Bld: 238 mg/dL — ABNORMAL HIGH (ref 70–99)
Potassium: 5.3 mmol/L — ABNORMAL HIGH (ref 3.5–5.1)
Sodium: 136 mmol/L (ref 135–145)

## 2022-12-28 LAB — CBC
HCT: 46.8 % — ABNORMAL HIGH (ref 36.0–46.0)
Hemoglobin: 14.9 g/dL (ref 12.0–15.0)
MCH: 26.9 pg (ref 26.0–34.0)
MCHC: 31.8 g/dL (ref 30.0–36.0)
MCV: 84.5 fL (ref 80.0–100.0)
Platelets: 191 10*3/uL (ref 150–400)
RBC: 5.54 MIL/uL — ABNORMAL HIGH (ref 3.87–5.11)
RDW: 17.2 % — ABNORMAL HIGH (ref 11.5–15.5)
WBC: 6.1 10*3/uL (ref 4.0–10.5)
nRBC: 0 % (ref 0.0–0.2)

## 2022-12-28 LAB — GLUCOSE, CAPILLARY
Glucose-Capillary: 66 mg/dL — ABNORMAL LOW (ref 70–99)
Glucose-Capillary: 65 mg/dL — ABNORMAL LOW (ref 70–99)
Glucose-Capillary: 99 mg/dL (ref 70–99)
Glucose-Capillary: 242 mg/dL — ABNORMAL HIGH (ref 70–99)
Glucose-Capillary: 290 mg/dL — ABNORMAL HIGH (ref 70–99)
Glucose-Capillary: 229 mg/dL — ABNORMAL HIGH (ref 70–99)

## 2022-12-28 MED ORDER — SODIUM ZIRCONIUM CYCLOSILICATE 5 G PO PACK
5.0000 g | PACK | Freq: Once | ORAL | Status: AC
  Administered 2022-12-28: 5 g via ORAL
  Filled 2022-12-28: qty 1

## 2022-12-28 MED ORDER — SPIRONOLACTONE 12.5 MG HALF TABLET
12.5000 mg | ORAL_TABLET | Freq: Every day | ORAL | Status: DC
  Administered 2022-12-30 – 2023-01-06 (×8): 12.5 mg via ORAL
  Filled 2022-12-28 (×8): qty 1

## 2022-12-28 NOTE — Progress Notes (Addendum)
PROGRESS NOTE   Subjective/Complaints: Pt up in chair. Denies any problems. Says pain is controlled. Did not report dizziness to me today.  Review of Systems  Constitutional:  Negative for chills, fever and malaise/fatigue.  Eyes:        Baseline blind R eye  Respiratory:  Negative for shortness of breath.   Cardiovascular:  Negative for chest pain and palpitations.  Gastrointestinal:  Negative for abdominal pain, constipation, nausea and vomiting.  Genitourinary:  Negative for dysuria, frequency, hematuria and urgency.  Musculoskeletal:  Positive for joint pain.  Skin:  Negative for rash.  Neurological:  Negative for dizziness (transient episodes) and headaches.  Psychiatric/Behavioral:  The patient is nervous/anxious.     Objective:   No results found. Recent Labs    12/28/22 0912  WBC 6.1  HGB 14.9  HCT 46.8*  PLT 191    Recent Labs    12/28/22 0912  NA 136  K 5.3*  CL 97*  CO2 28  GLUCOSE 238*  BUN 16  CREATININE 0.75  CALCIUM 9.9      Shoulder Xray 11/30/22 Postsurgical changes are noted in the proximal right humerus with deformity identified. Underlying bony thorax appears within normal limits. No fracture or dislocation is seen. Stable bone island is noted in the mid humerus. No new focal abnormality is seen.   IMPRESSION: Postsurgical  changes without acute abnormality.     Intake/Output Summary (Last 24 hours) at 12/28/2022 1004 Last data filed at 12/28/2022 0745 Gross per 24 hour  Intake 320 ml  Output --  Net 320 ml         Physical Exam: Vital Signs Blood pressure 131/82, pulse 87, temperature 97.7 F (36.5 C), temperature source Oral, resp. rate 18, height 5' 7"$  (1.702 m), weight 86 kg, SpO2 93 %.  Physical Exam Constitutional: No distress . Vital signs reviewed. HEENT: NCAT, EOMI, oral membranes moist Neck: supple Cardiovascular: RRR without murmur. No JVD     Respiratory/Chest: CTA Bilaterally without wheezes or rales. Normal effort    GI/Abdomen: BS +, non-tender, non-distended Ext: no clubbing, cyanosis, or edema Psych: pleasant and cooperative  Skin: BLE with ichthyosis. RLE mepilex dressing in place. Finger bilateral hand flushed appearing. Trace BLE edema today.   Neurologic/MsK:   Sensory exam: revealed reduced sensation to light touch over right plantar foot and heel. .--no change Motor exam: Moving all 4 extremities to gravity R shoulder tenderness with palpation and Rom in all directions.  L knee brace in place No abnormal tone noted  Prior Exam      Strength:                RUE: 4/5 SA, 5-/5 EF, 5-/5 EE, 5/5 WE, 5/5 FF, 5/5 FA                 LUE: 5/5 SA, 5/5 EF, 5/5 EE, 5/5 WE, 5/5 FF, 5/5 FA                 RLE: 3+/5 HF, 4+/5 KE, 4/5 DF,  5/5 PF                 LLE:  3-/5 HF,  3+/5 KE, 5/5 DF,  5/5 PF  Coordination: Fine motor coordination was normal.      Assessment/Plan: 1. Functional deficits which require 3+ hours per day of interdisciplinary therapy in a comprehensive inpatient rehab setting. Physiatrist is providing close team supervision and 24 hour management of active medical problems listed below. Physiatrist and rehab team continue to assess barriers to discharge/monitor patient progress toward functional and medical goals  Care Tool:  Bathing    Body parts bathed by patient: Right arm, Chest, Abdomen, Front perineal area, Right upper leg, Left upper leg, Face, Left arm, Buttocks, Left lower leg, Right lower leg   Body parts bathed by helper: Left arm, Buttocks, Right lower leg, Left lower leg     Bathing assist Assist Level: Contact Guard/Touching assist (shower on BSC)     Upper Body Dressing/Undressing Upper body dressing   What is the patient wearing?: Pull over shirt    Upper body assist Assist Level: Supervision/Verbal cueing    Lower Body Dressing/Undressing Lower body dressing      What is  the patient wearing?: Incontinence brief, Pants     Lower body assist Assist for lower body dressing: Moderate Assistance - Patient 50 - 74% (in the stedy)     [place] for toileting: Moderate Assistance - Patient 50 - 74%     Transfers Chair/bed transfer  Transfers assist     Chair/bed transfer assist level: Dependent - mechanical lift     Locomotion Ambulation   Ambulation assist   Ambulation activity did not occur: Safety/medical concerns          Walk 10 feet activity   Assist  Walk 10 feet activity did not occur: Safety/medical concerns        Walk 50 feet activity   Assist Walk 50 feet with 2 turns activity did not occur: Safety/medical concerns         Walk 150 feet activity   Assist Walk 150 feet activity did not occur: Safety/medical concerns         Walk 10 feet on uneven surface  activity   Assist Walk 10 feet on uneven surfaces activity did not occur: Safety/medical concerns         Wheelchair     Assist Is the patient using a wheelchair?: Yes Type of Wheelchair: Manual    Wheelchair assist level: Dependent - Patient 0% Max wheelchair distance: 150    Wheelchair 50 feet with 2 turns activity    Assist        Assist Level: Dependent - Patient 0%   Wheelchair 150 feet activity     Assist      Assist Level: Dependent - Patient 0%   Blood pressure 131/82, pulse 87, temperature 97.7 F (36.5 C), temperature source Oral, resp. rate 18, height 5' 7"$  (1.702 m), weight 86 kg, SpO2 93 %.  Medical Problem List and Plan: 1. Functional deficits secondary to debility due to acute on chronic HFpEF, atrial fibrillation with RVR             -patient may shower             -ELOS/Goals: 2/21, min A/mod A for gait at least 10-15 feet  -Continue CIR, PT/OT   2.  Antithrombotics: -DVT/anticoagulation:  Lovenox 40 mg daily (No full-dose AC d/t Hx SAH 2007)              -antiplatelet therapy: ASA 325 daily per IM  until OP cards f/u 3. Pain Management:  Oxycodone or tramadol prn. Kpad ordered -encourage use of Voltaren gel 2g QID for OA right shoulder/left knee.  4. Mood/Behavior/Sleep: LCSW to follow for evaluation and support.              -Melatonin prn--12/05/22 ordered as scheduled 60m QHS, Trazodone PRN   -12/06/22 sleeping improving, continue current regimen -12/12/22 wants melatonin to be changed to PRN, ordered this; advised she will need to request sleep meds if she needs them -2/11-12 improved, cont regimen             -antipsychotic agents: N/A  -Nortriptyline 155mQHS, Neurontin 10052mHS 5. Neuropsych/cognition: This patient is capable of making decisions on her own behalf.  -start xanax 0.25 BID PRN for anxiety -Consulted Psychiatry- psychiatry reviewed her case and case discussed with  TakSheran Favasych recommended continuation current medications encourage therapy and did not feel this issue would benefit from inpatient psych, ok to cancel consult.   -1/31 start low dose duloxetine 18m34miscussed risks and benefits of this medication,may also help with joint pain -2/2 increase duloxetine to 30mg2mly -2/7 increased duloxetine to 60mg 44m2/12 pt appears crips and alert 6. Skin/Wound Care: Add lotion to BLE and hands             -routine pressure relief measures.  7. Fluids/Electrolytes/Nutrition: Monitor I/O. Intake improving. Monitor weekly BMPs, next 12/28/22 -May need to stay dry to avoid fluid overload. 8. Acute on chronic diastolic CHF: Strict I/O w/daily weights. -Cardiac diet. Monitor for signs of overload.  -continue Lasix 40mg Q4mldactone 12.5mg QD,24mtoprolol 100mg QD,89mvastatin 40mg QD a35mosartan 50mg QD -m60mor for recurrent hyperkalemia (Losartan dose decreased 01/14) -2/1 A little up from a few days ago but appears stable overall -2/12 weight remains steady Filed Weights   12/26/22 0511 12/27/22 0500 12/28/22  0503  Weight: 86.5 kg 86 kg 86 kg     9. A Fib w/RVR: Monitor HR TID--continue Cardizem 180mg QD and70moprolol 100mg QD  10.42m: BPs with some lability, 130s-160s SBP -12/05/22 monitor for now, Losartan decreased 11/29/22, could consider going back to home dosing if appropriate -1/23 BP has been elevated, will increase losartan to 50mg  -2/12 o101mion elevation but otherwise within range. Would not make changes at this time Vitals:   12/24/22 0414 12/24/22 1426 12/25/22 0334 12/25/22 1443  BP: (!) 143/61 (!) 114/56 136/74 121/73   12/25/22 2008 12/26/22 0324 12/26/22 1416 12/26/22 1917  BP: (!) 137/94 130/72 126/63 (!) 140/73   12/27/22 0455 12/27/22 1658 12/27/22 2030 12/28/22 0428  BP: (!) 141/62 (!) 158/87 134/84 131/82     11.  T1DM: Hgb A1c- 7.4. Monitor BS ac/hs and use SSI for elevated BS             -continue insulin glargline 15 units daily.  -May need to add meal coverage-->used 5- 7units tid prn PTA.  -12/05/22 increase Semglee to 16U QD -1/24 add 2 units mealtime insulin -1/25 increase mealtime to 4 units insulin, decrease lantus to 14 units -1/26 decrease mealtime insulin to 3 units due to hypoglycemia, semglee to AM -1/31 trending a little lower, decrease mealtime insulin dose to 2 units -2/1 decrease semglee to 11 units from 14units, recommend nighttime snack -2/2 decreased to moderate resistance correctional SSI -12/20/22 increase mealtime novolog to 3U again and leave Semglee as is -12/21/22 increase semglee to 13u -2/9 will adjust semglee to 16units, stop mealtime- per pharm  recs -2/12 CBG's with a big drop today after being in the 200's-->No recorded intake after breakfast 2/11. Follow for further pattern today  CBG (last 3)  Recent Labs    12/28/22 0628 12/28/22 0656 12/28/22 0733  GLUCAP 66* 65* 99      12. Chronic bilateral shoulder and left knee pain: Oxycodone prn -Refusing voltaren gel for local measures-->encourage use             -Aquathermia for  local measures.              -L knee: Follows OP ortho, no benefit from injections in the past  -L knee pain improved with knee brace  13. Upper respiratory infection, Nocturnal hypoxia: Question sleep apnea.  -Encourage pulmonary hygiene -Respiratory viral panel negative x2 inpatient -Remains with cough, intermittent SOB - add PRN inhaler, monitor  14.  Pre-renal azotemia: Likely due to diuresis. BUN up but SCr improving.   -Continue to monitor.  -2/12 Bun and Cr stable   15. Overactive bladder, incontinence: hx of, on Myrbetriq 87m QD; now off purewick -12/05/22 having increased frequency and incontinence, ordered U/A, UCx, and scheduled voiding program -12/06/22 U/A c/w UTI, see #16 -Continue myrbetriq,she is mostly continent with occasional incontinent episodes -12/28/22 continues to have improvement, cont regimen  16. UTI 12/06/22:  -U/A c/w UTI, ordered Macrobid 1018mBID x5 days (12/06/22>>12/10/22 last dose), UCx pending- Ecoli sensitive to macrobid, completed course, symptoms resolved -2/7 cloudy urine yesterday, asymptomatic, continue to monitor - denies symptoms of UTI  17.  Hypokalemia  -305mx2, recheck tomorrow  -K+ up to 3.6, losartan increased today, Recheck tomorrow -1/25 K+ now elevated hyperkalemia at 5.4, lokelma 5g  x1, stop potassium supplementation, discussed with pharmacy, recheck tomorrow -2/12--5.3 no hemolysis reported on bmet. No scheduled potassium today. Give small dose of lokelma today. Also on losartan and aldactone---hold aldactone tomorrow pending repeat bmet. Consider stopping losartan too if bp's are controlled.   18. Dizziness -1/22 Will start meclizine 12.5mg3mN, check orthostatic VS -She has HX of vertigo going back to 2013, seen by Dr. WongJacelyn Griprology, felt to be related to vestibulopathy -1/30 fall yesterday but no injury -2/2 increase meclizine to 25mg31m prn -12/28/22 --did not report dizziness. Continue meclizine prn  19. Constipation:  reported as chronic  -Schedule miralax QD, PRN dulcolax supp and fleet enema -1/26 had vomiting with  sorbitol yesterday, continue miralax daily, she had large BM yesterday -1/29 miralax increased to BID -1/30 she declines stronger medication at this time,order sorbitol PRN -2/1 large bm yesterday, improved  -12/19/22 now having increased stool frequency, wants to change Miralax to BID PRN rather than scheduled; also wants Colace 100mg 48mPRN in case she needs it; knows to ask -12/28/22 multiple stools over last 24 hours--on no scheduled bowel meds 20. R shoulder pain  -Xray on 1/15 with postsurgical changes, continue voltaren gel, Kpad  -1/31 Duloxetine started as this may help with joint pain also -2/2 Duloxetine increase to 30mg -66mincreased duloxetine to 60mg   27m 24 days [email or web address], 10:04 AM

## 2022-12-28 NOTE — Progress Notes (Signed)
Occupational Therapy Session Note  Patient Details  Name: [name] MRN: [id number] Date of Birth: 09-Nov-1951  Today's Date: 12/28/2022 OT Individual Time: 1345-1443 OT Individual Time Calculation (min): 58 min    Short Term Goals: Week 4:  OT Short Term Goal 1 (Week 4): STG= LTG d/t ELOS  Skilled Therapeutic Interventions/Progress Updates:   Pt greeted while up in [place] upon OT arrival. Agreeable to all presented activity. P motivated with idea of progressing standing sink side in room during functional ADL's. Stood from [facility] sink side x 2 trials of 2 min without the STEDY support and just the support of sink counter. Cues and facilitation to maintain upright trunk. OT training with [place] navi to [employer] and back on open hallway on low to low/mod indoor speeds with min A on occasion but primarily CGA except while negotiating busier spaces. Demo apt kitchen spaces with min A for navigation due to need for reversing and wide head turn d/t R visual deficit. Core and L UE strength trng with reaching for 25 item,s from slightly elevated surface witil;e WB on B LE's and integ trunk rotation. Once back in room, pt set [place] up in bed side space with CGA only. Left with all needs in reach, nurse call button and safety measures in place.   Therapy Documentation Precautions:  Precautions Precautions: Shoulder Type of Shoulder Precautions: R-shoulder pain with ROM/WB Precaution Comments: History of frequent falls at home. Restrictions Weight Bearing Restrictions: No     Therapy/Group: Individual Therapy  Barnabas Lister 12/28/2022, 7:49 AM

## 2022-12-28 NOTE — Progress Notes (Signed)
Occupational Therapy Weekly Progress Note  Patient Details  Name: [name] MRN: [id number] Date of Birth: [birth year]  Beginning of progress report period: 12/21/2022 End of progress report period: 12/28/2022  Today's Date: 12/28/2022 OT Individual Time: 1110-1205 OT Individual Time Calculation (min): 55 min    Patient has met 3 of 3 short term goals.  [name] has made good progress toward her OT goals and continues to make significant gains. She has gained a lot of confidence in her standing and is able to consistently perform standing trials in the RW with mod-max A overall. After lengthy discussion with family, it is still unclear if standing with the RW is a realistic goal or if the stedy should be the main transfer method at home. Power w/c mobility is also being trialed. She is able to complete UB ADLs with (S) and LB ADLs with mod A at bed level and max A sit <> stand. Family education will be completed closer to d/c.   Patient continues to demonstrate the following deficits: muscle weakness and muscle joint tightness, decreased cardiorespiratoy endurance, unbalanced muscle activation, and decreased standing balance, decreased postural control, and decreased balance strategies and therefore will continue to benefit from skilled OT intervention to enhance overall performance with BADL and Reduce care partner burden.  Patient progressing toward long term goals..  Continue plan of care.  OT Short Term Goals Week 3:  OT Short Term Goal 1 (Week 3): Pt will stand consistently in the stedy with min A OT Short Term Goal 1 - Progress (Week 3): Met OT Short Term Goal 2 (Week 3): Pt will complete bed mobilty rolling R and L with min A OT Short Term Goal 2 - Progress (Week 3): Met OT Short Term Goal 3 (Week 3): Pt will complete LB bathing at bed level with min A OT Short Term Goal 3 - Progress (Week 3): Met Week 4:  OT Short Term Goal 1 (Week 4): STG= LTG d/t ELOS  Skilled Therapeutic  Interventions/Progress Updates:    Session focused on power w/c navigation, with extra focus on backward navigation to address functional maneuvering in a home environment. She required frequent cueing for using joystick- with great difficulty going backward at all. She was provided with a mirror to attempt and use as a "rearview" mirror to provide more visual input posteriorly. Several trials completed in the hallway- mostly just straight backward. She then worked on standing from the power w/c with cueing/extra time to get power w/c seat in the right position to facilitate stand. She ultimately required max A to stand, of just 1, 3 trials. Once standing she required mod-max A for posterior bias. She returned to her room and was left sitting up with all needs met.   Therapy Documentation Precautions:  Precautions Precautions: Shoulder Type of Shoulder Precautions: R-shoulder pain with ROM/WB Precaution Comments: History of frequent falls at home. Restrictions Weight Bearing Restrictions: No    Therapy/Group: Individual Therapy  Curtis Sites 12/28/2022, 6:23 AM

## 2022-12-28 NOTE — Progress Notes (Addendum)
  Hypoglycemic Event  CBG: 66  Treatment: 4 oz juice/soda, breakfast tray  Symptoms: None  Follow-up CBG: [id number] CBG Result:65  Possible Reasons for Event: Unknown  Comments/MD notified:Y, Jaynee Eagles, MD    Melene Plan, LPN

## 2022-12-28 NOTE — Progress Notes (Signed)
Occupational Therapy Session Note  Patient Details  Name: [name] MRN: [id number] Date of Birth: 1950/11/17  Today's Date: 12/28/2022 OT Individual Time: 0930-1000 OT Individual Time Calculation (min): 30 min    Short Term Goals: Week 3:  OT Short Term Goal 1 (Week 3): Pt will stand consistently in the stedy with min A OT Short Term Goal 1 - Progress (Week 3): Met OT Short Term Goal 2 (Week 3): Pt will complete bed mobilty rolling R and L with min A OT Short Term Goal 2 - Progress (Week 3): Met OT Short Term Goal 3 (Week 3): Pt will complete LB bathing at bed level with min A OT Short Term Goal 3 - Progress (Week 3): Met  Skilled Therapeutic Interventions/Progress Updates:    Pt resting in [place] upon arrival.OT intervention with focus on w/c mobility in home setting and in open hallway. Supervision in hallway. Pt practiced backing next to bed in ADL apt and next to bed in room. Mobility in kitchen to access door and exit kitchen into hallway. PWC on slowest speed. Pt required max verbal cues for backing up successfully. Pt remained in [place]. All needs within reach.   Therapy Documentation Precautions:  Precautions Precautions: Shoulder Type of Shoulder Precautions: R-shoulder pain with ROM/WB Precaution Comments: History of frequent falls at home. Restrictions Weight Bearing Restrictions: No   Pain:  Pt deneis pain this morning  Therapy/Group: Individual Therapy  Leroy Libman 12/28/2022, 12:18 PM

## 2022-12-28 NOTE — Progress Notes (Signed)
Physical Therapy Session Note  Patient Details  Name: [name] MRN: [id number] Date of Birth: 21-Nov-1950  Today's Date: 12/28/2022 PT Individual Time: 0755-0840 PT Individual Time Calculation (min): 45 min   Short Term Goals: Week 3:  PT Short Term Goal 1 (Week 3): Pt will perform supine<>sit with minA consistently. PT Short Term Goal 2 (Week 3): Pt will performs sit<>stand using LRAD with modA consistently. PT Short Term Goal 3 (Week 3): Pt will perform bed<>chair transfer consistently with modA PT Short Term Goal 4 (Week 3): Pt will ambulate at least 10' with modA +1 and LRAD.  Skilled Therapeutic Interventions/Progress Updates:    Chart reviewed and pt agreeable to therapy. Pt received seated in PWC with no c/o pain. Session focused on [place] navigation and sit to stand practice to promote home access and return to ambulation. Pt initiated session with [place] navigation of 252f to therapy room using MinA. PT instructed pt on changing speed levels. Pt then transferred to therapy mat with MinA + STEDY. On therapy mat, pt practiced multiple sit to stands with Max/ModA + RW. Of note, pt demonstrated appropriate standing set up, but displayed limited trunk flexion through stand, requiring strong VC and totalA for standing balance. Pt reported inability to bring weight over feet. PT [facility]. In standing with STEDY, pt demonstrated ability to bring weight over feet, but reported it not feeling different than standing with RW. Pt then practiced prolonged standing in STEDY with pt cueing for knee alignment. Pt then returned to room [facility]. At end of session, pt was left [place] in [place], nurse call bell and all needs in reach.     Therapy Documentation Precautions:  Precautions Precautions: Shoulder Type of Shoulder Precautions: R-shoulder pain with ROM/WB Precaution Comments: History of frequent falls at home. Restrictions Weight  Bearing Restrictions: No    Therapy/Group: Individual Therapy  KMarquette Old2/10/2023, 12:18 PM

## 2022-12-29 LAB — BASIC METABOLIC PANEL
Anion gap: 10 (ref 5–15)
Anion gap: 8 (ref 5–15)
BUN: 19 mg/dL (ref 8–23)
BUN: 20 mg/dL (ref 8–23)
CO2: 29 mmol/L (ref 22–32)
CO2: 31 mmol/L (ref 22–32)
Calcium: 9.6 mg/dL (ref 8.9–10.3)
Calcium: 9.9 mg/dL (ref 8.9–10.3)
Chloride: 98 mmol/L (ref 98–111)
Chloride: 95 mmol/L — ABNORMAL LOW (ref 98–111)
Creatinine, Ser: 0.65 mg/dL (ref 0.44–1.00)
Creatinine, Ser: 0.77 mg/dL (ref 0.44–1.00)
GFR, Estimated: >60 mL/min (ref >60)
GFR, Estimated: >60 mL/min (ref >60)
Glucose, Bld: 53 mg/dL — ABNORMAL LOW (ref 70–99)
Glucose, Bld: 266 mg/dL — ABNORMAL HIGH (ref 70–99)
Potassium: 3.9 mmol/L (ref 3.5–5.1)
Potassium: 4.9 mmol/L (ref 3.5–5.1)
Sodium: 137 mmol/L (ref 135–145)
Sodium: 134 mmol/L — ABNORMAL LOW (ref 135–145)

## 2022-12-29 LAB — GLUCOSE, CAPILLARY
Glucose-Capillary: 46 mg/dL — ABNORMAL LOW (ref 70–99)
Glucose-Capillary: 52 mg/dL — ABNORMAL LOW (ref 70–99)
Glucose-Capillary: 65 mg/dL — ABNORMAL LOW (ref 70–99)
Glucose-Capillary: 57 mg/dL — ABNORMAL LOW (ref 70–99)
Glucose-Capillary: 82 mg/dL (ref 70–99)
Glucose-Capillary: 390 mg/dL — ABNORMAL HIGH (ref 70–99)
Glucose-Capillary: 390 mg/dL — ABNORMAL HIGH (ref 70–99)
Glucose-Capillary: 355 mg/dL — ABNORMAL HIGH (ref 70–99)
Glucose-Capillary: 286 mg/dL — ABNORMAL HIGH (ref 70–99)
Glucose-Capillary: 228 mg/dL — ABNORMAL HIGH (ref 70–99)

## 2022-12-29 MED ORDER — SODIUM CHLORIDE 0.9% FLUSH: 3.0000 mL | INTRAVENOUS | Status: DC | PRN

## 2022-12-29 MED ORDER — INSULIN GLARGINE-YFGN 100 UNIT/ML ~~LOC~~ SOLN
13.0000 [IU] | Freq: Every day | SUBCUTANEOUS | Status: DC
  Administered 2022-12-30 – 2023-01-01 (×3): 13 [IU] via SUBCUTANEOUS
  Filled 2022-12-29 (×4): qty 0.13

## 2022-12-29 MED ORDER — SODIUM CHLORIDE 0.9 % IV SOLN: 250.0000 mL | INTRAVENOUS | Status: DC | PRN

## 2022-12-29 MED ORDER — INSULIN ASPART 100 UNIT/ML IJ SOLN
0.0000 [IU] | Freq: Three times a day (TID) | INTRAMUSCULAR | Status: DC
  Administered 2022-12-29: 5 [IU] via SUBCUTANEOUS
  Administered 2022-12-30: 7 [IU] via SUBCUTANEOUS
  Administered 2022-12-30: 1 [IU] via SUBCUTANEOUS
  Administered 2022-12-30 – 2023-01-01 (×5): 5 [IU] via SUBCUTANEOUS
  Administered 2023-01-02: 3 [IU] via SUBCUTANEOUS
  Administered 2023-01-02 – 2023-01-03 (×2): 5 [IU] via SUBCUTANEOUS
  Administered 2023-01-03 (×2): 1 [IU] via SUBCUTANEOUS
  Administered 2023-01-04: 2 [IU] via SUBCUTANEOUS
  Administered 2023-01-04: 5 [IU] via SUBCUTANEOUS
  Administered 2023-01-05: 3 [IU] via SUBCUTANEOUS
  Administered 2023-01-05: 5 [IU] via SUBCUTANEOUS

## 2022-12-29 MED ORDER — SODIUM CHLORIDE 0.9% FLUSH: 3.0000 mL | Freq: Two times a day (BID) | INTRAVENOUS | Status: DC

## 2022-12-29 MED ORDER — LOSARTAN POTASSIUM 25 MG PO TABS
25.0000 mg | ORAL_TABLET | Freq: Every day | ORAL | Status: DC
  Administered 2022-12-30 – 2023-01-06 (×8): 25 mg via ORAL
  Filled 2022-12-29 (×8): qty 1

## 2022-12-29 NOTE — Progress Notes (Signed)
PROGRESS NOTE   Subjective/Complaints: Hypoglycemic event today. She denies any symptoms with this event. No additional concerns today.  Feels better after shower this AM.   Review of Systems  Constitutional:  Negative for chills, fever and malaise/fatigue.  Eyes:        Baseline blind R eye  Respiratory:  Negative for shortness of breath.   Cardiovascular:  Negative for chest pain.  Gastrointestinal:  Negative for abdominal pain, nausea and vomiting.  Genitourinary:  Negative for dysuria, frequency, hematuria and urgency.  Musculoskeletal:  Positive for joint pain.  Skin:  Negative for rash.  Neurological:  Positive for sensory change. Negative for dizziness (transient episodes) and headaches.  Psychiatric/Behavioral:  The patient is nervous/anxious.     Objective:   No results found. Recent Labs    12/28/22 0912  WBC 6.1  HGB 14.9  HCT 46.8*  PLT 191    Recent Labs    12/28/22 0912 12/29/22 0533  NA 136 137  K 5.3* 3.9  CL 97* 98  CO2 28 29  GLUCOSE 238* 53*  BUN 16 19  CREATININE 0.75 0.65  CALCIUM 9.9 9.6      Shoulder Xray 11/30/22 Postsurgical changes are noted in the proximal right humerus with deformity identified. Underlying bony thorax appears within normal limits. No fracture or dislocation is seen. Stable bone island is noted in the mid humerus. No new focal abnormality is seen.   IMPRESSION: Postsurgical  changes without acute abnormality.     Intake/Output Summary (Last 24 hours) at 12/29/2022 0826 Last data filed at 12/28/2022 1723 Gross per 24 hour  Intake 377 ml  Output --  Net 377 ml         Physical Exam: Vital Signs Blood pressure 118/62, pulse 83, temperature 98.3 F (36.8 C), resp. rate 18, height 5' 7"$  (1.702 m), weight 85.9 kg, SpO2 98 %.  Physical Exam Constitutional: No distress . Vital signs reviewed. Sitting in WC- appears comfortable HEENT: NCAT, conjugate  gaze, oral membranes moist Neck: supple Cardiovascular: RRR without murmur. No JVD    Respiratory/Chest: CTA Bilaterally without wheezes or rales. Normal effort    GI/Abdomen: BS +, non-tender, non-distended Ext: no clubbing, cyanosis, or edema Psych: pleasant and cooperative  Skin: BLE with ichthyosis. RLE mepilex dressing in place. Finger bilateral hand flushed appearing. Trace BLE edema today.   Neurologic/MsK:   Sensory exam: revealed reduced sensation to light touch over right plantar foot and heel. Motor exam: Moving all 4 extremities to gravity R shoulder tenderness with palpation and Rom in all directions.  L knee brace in place No abnormal tone noted  Prior Exam      Strength:                RUE: 4/5 SA, 5-/5 EF, 5-/5 EE, 5/5 WE, 5/5 FF, 5/5 FA                 LUE: 5/5 SA, 5/5 EF, 5/5 EE, 5/5 WE, 5/5 FF, 5/5 FA                 RLE: 3+/5 HF, 4+/5 KE, 4/5 DF,  5/5 PF  LLE:  3-/5 HF, 3+/5 KE, 5/5 DF,  5/5 PF  Coordination: Fine motor coordination was normal.      Assessment/Plan: 1. Functional deficits which require 3+ hours per day of interdisciplinary therapy in a comprehensive inpatient rehab setting. Physiatrist is providing close team supervision and 24 hour management of active medical problems listed below. Physiatrist and rehab team continue to assess barriers to discharge/monitor patient progress toward functional and medical goals  Care Tool:  Bathing    Body parts bathed by patient: Right arm, Chest, Abdomen, Front perineal area, Right upper leg, Left upper leg, Face, Left arm, Buttocks, Left lower leg, Right lower leg   Body parts bathed by helper: Left arm, Buttocks, Right lower leg, Left lower leg     Bathing assist Assist Level: Contact Guard/Touching assist (shower on BSC)     Upper Body Dressing/Undressing Upper body dressing   What is the patient wearing?: Pull over shirt    Upper body assist Assist Level: Supervision/Verbal cueing     Lower Body Dressing/Undressing Lower body dressing      What is the patient wearing?: Incontinence brief, Pants     Lower body assist Assist for lower body dressing: Moderate Assistance - Patient 50 - 74% (in the stedy)     [place] for toileting: Moderate Assistance - Patient 50 - 74%     Transfers Chair/bed transfer  Transfers assist     Chair/bed transfer assist level: Dependent - mechanical lift     Locomotion Ambulation   Ambulation assist   Ambulation activity did not occur: Safety/medical concerns          Walk 10 feet activity   Assist  Walk 10 feet activity did not occur: Safety/medical concerns        Walk 50 feet activity   Assist Walk 50 feet with 2 turns activity did not occur: Safety/medical concerns         Walk 150 feet activity   Assist Walk 150 feet activity did not occur: Safety/medical concerns         Walk 10 feet on uneven surface  activity   Assist Walk 10 feet on uneven surfaces activity did not occur: Safety/medical concerns         Wheelchair     Assist Is the patient using a wheelchair?: Yes Type of Wheelchair: Manual    Wheelchair assist level: Dependent - Patient 0% Max wheelchair distance: 150    Wheelchair 50 feet with 2 turns activity    Assist        Assist Level: Dependent - Patient 0%   Wheelchair 150 feet activity     Assist      Assist Level: Dependent - Patient 0%   Blood pressure 118/62, pulse 83, temperature 98.3 F (36.8 C), resp. rate 18, height 5' 7"$  (1.702 m), weight 85.9 kg, SpO2 98 %.  Medical Problem List and Plan: 1. Functional deficits secondary to debility due to acute on chronic HFpEF, atrial fibrillation with RVR             -patient may shower             -ELOS/Goals: 2/21, min A/mod A for gait at least 10-15 feet  -Continue CIR, PT/OT   2.  Antithrombotics: -DVT/anticoagulation:  Lovenox 40 mg daily (No  full-dose AC d/t Hx SAH 2007)             -antiplatelet therapy: ASA 325 daily per  IM until OP cards f/u 3. Pain Management:  Oxycodone or tramadol prn. Kpad ordered -encourage use of Voltaren gel 2g QID for OA right shoulder/left knee.  4. Mood/Behavior/Sleep: LCSW to follow for evaluation and support.              -Melatonin prn--12/05/22 ordered as scheduled 55m QHS, Trazodone PRN   -12/06/22 sleeping improving, continue current regimen -12/12/22 wants melatonin to be changed to PRN, ordered this; advised she will need to request sleep meds if she needs them             -antipsychotic agents: N/A  -Nortriptyline 110mQHS, Neurontin 10054mHS 5. Neuropsych/cognition: This patient is capable of making decisions on her own behalf.  -start xanax 0.25 BID PRN for anxiety -Consulted Psychiatry- psychiatry reviewed her case and case discussed with  TakSheran Favasych recommended continuation current medications encourage therapy and did not feel this issue would benefit from inpatient psych, ok to cancel consult.   -1/31 start low dose duloxetine 54m48miscussed risks and benefits of this medication,may also help with joint pain -2/2 increase duloxetine to 30mg5mly -2/7 increased duloxetine to 60mg 51m2/13 anxiety a little improved per patient, continue duloxetine  6. Skin/Wound Care: Add lotion to BLE and hands             -routine pressure relief measures.  7. Fluids/Electrolytes/Nutrition: Monitor I/O. Intake improving. Monitor weekly BMPs, next 12/28/22 -May need to stay dry to avoid fluid overload. 8. Acute on chronic diastolic CHF: Strict I/O w/daily weights. -Cardiac diet. Monitor for signs of overload.  -continue Lasix 40mg Q68mldactone 12.5mg QD,19mtoprolol 100mg QD,92mvastatin 40mg QD a74mosartan 50mg QD -m7mor for recurrent hyperkalemia (Losartan dose decreased 01/14) -2/1 A little up from a few days ago but appears stable overall -2/13 wt stable, continue to  monitor Filed Weights   12/27/22 0500 12/28/22 0503 12/29/22 0500  Weight: 86 kg 86 kg 85.9 kg     9. A Fib w/RVR: Monitor HR TID--continue Cardizem 180mg QD and74moprolol 100mg QD  10.70m: BPs with some lability, 130s-160s SBP -12/05/22 monitor for now, Losartan decreased 11/29/22, could consider going back to home dosing if appropriate -1/23 BP has been elevated, will increase losartan to 50mg  -2/13 w63mcontrolled overall, decrease losartan dose to 25mg due to K+55mvated Vitals:   12/25/22 0334 12/25/22 1443 12/25/22 2008 12/26/22 0324  BP: 136/74 121/73 (!) 137/94 130/72   12/26/22 1416 12/26/22 1917 12/27/22 0455 12/27/22 1658  BP: 126/63 (!) 140/73 (!) 141/62 (!) 158/87   12/27/22 2030 12/28/22 0428 12/28/22 1311 12/28/22 1947  BP: 134/84 131/82 126/79 118/62     11.  T1DM: Hgb A1c- 7.4. Monitor BS ac/hs and use SSI for elevated BS             -continue insulin glargline 15 units daily.  -May need to add meal coverage-->used 5- 7units tid prn PTA.  -12/05/22 increase Semglee to 16U QD -1/24 add 2 units mealtime insulin -1/25 increase mealtime to 4 units insulin, decrease lantus to 14 units -1/26 decrease mealtime insulin to 3 units due to hypoglycemia, semglee to AM -1/31 trending a little lower, decrease mealtime insulin dose to 2 units -2/1 decrease semglee to 11 units from 14units, recommend nighttime snack -2/2 decreased to moderate resistance correctional SSI -12/20/22 increase mealtime novolog to 3U again and leave Semglee as is -12/21/22 increase semglee to 13u -2/9 will adjust semglee to 16units, stop mealtime- per pharm  recs -2/12 CBG's with a big drop today after being in the 200's-->No recorded intake after breakfast 2/11. -2/13 Decrease semglee to 13 units due to hypoglycemia, I suspect she does not eat consistent carbs with each meal   CBG (last 3)  Recent Labs    12/29/22 0628 12/29/22 0649 12/29/22 0657  GLUCAP 65* 57* 82      12. Chronic bilateral  shoulder and left knee pain: Oxycodone prn -Refusing voltaren gel for local measures-->encourage use             -Aquathermia for local measures.              -L knee: Follows OP ortho, no benefit from injections in the past  -L knee pain improved with knee brace  13. Upper respiratory infection, Nocturnal hypoxia: Question sleep apnea.  -Encourage pulmonary hygiene -Respiratory viral panel negative x2 inpatient -Remains with cough, intermittent SOB - add PRN inhaler, monitor  14.  Pre-renal azotemia: Likely due to diuresis. BUN up but SCr improving.   -Continue to monitor.  -2/12 Bun and Cr stable   15. Overactive bladder, incontinence: hx of, on Myrbetriq 76m QD; now off purewick -12/05/22 having increased frequency and incontinence, ordered U/A, UCx, and scheduled voiding program -12/06/22 U/A c/w UTI, see #16 -Continue myrbetriq,she is mostly continent with occasional incontinent episodes -12/28/22 continues to have improvement, cont regimen  16. UTI 12/06/22:  -U/A c/w UTI, ordered Macrobid 1060mBID x5 days (12/06/22>>12/10/22 last dose), UCx pending- Ecoli sensitive to macrobid, completed course, symptoms resolved -2/7 cloudy urine yesterday, asymptomatic, continue to monitor - denies symptoms of UTI  17.  Hypokalemia  -3087mx2, recheck tomorrow  -K+ up to 3.6, losartan increased today, Recheck tomorrow -1/25 K+ now elevated hyperkalemia at 5.4, lokelma 5g  x1, stop potassium supplementation, discussed with pharmacy, recheck tomorrow -2/12--5.3 no hemolysis reported on bmet. No scheduled potassium today. Give small dose of lokelma today. Also on losartan and aldactone---hold aldactone tomorrow pending repeat bmet. Consider stopping losartan too if bp's are controlled.  -2/13 K+ down to 3.9, decrease losartan dose to 50m62m8. Dizziness -1/22 Will start meclizine 12.5mg 61m, check orthostatic VS -She has HX of vertigo going back to 2013, seen by Dr. Wong [facility], felt to be  related to vestibulopathy -1/30 fall yesterday but no injury -2/2 increase meclizine to 50mg 70mprn -12/28/22 --did not report dizziness. Continue meclizine prn  19. Constipation: reported as chronic  -Schedule miralax QD, PRN dulcolax supp and fleet enema -1/26 had vomiting with  sorbitol yesterday, continue miralax daily, she had large BM yesterday -1/29 miralax increased to BID -1/30 she declines stronger medication at this time,order sorbitol PRN -2/1 large bm yesterday, improved  -12/19/22 now having increased stool frequency, wants to change Miralax to BID PRN rather than scheduled; also wants Colace 100mg B43mRN in case she needs it; knows to ask -12/28/22 multiple stools over last 24 hours--on no scheduled bowel meds 20. R shoulder pain  -Xray on 1/15 with postsurgical changes, continue voltaren gel, Kpad  -1/31 Duloxetine started as this may help with joint pain also -2/2 Duloxetine increase to 30mg -271mncreased duloxetine to 60mg   L56m25 days A FACE TO FACE EVALUATION WAS PERFORMED  Wells Mabe ShtrJennye Boroughs4, 8:26 AM

## 2022-12-29 NOTE — Progress Notes (Signed)
Physical Therapy Session Note  Patient Details  Name: [name] MRN: [id number] Date of Birth: 02/02/1951  Today's Date: 12/29/2022 PT Individual Time: 1020-1120 PT Individual Time Calculation (min): 60 min   Short Term Goals: Week 3:  PT Short Term Goal 1 (Week 3): Pt will perform supine<>sit with minA consistently. PT Short Term Goal 2 (Week 3): Pt will performs sit<>stand using LRAD with modA consistently. PT Short Term Goal 3 (Week 3): Pt will perform bed<>chair transfer consistently with modA PT Short Term Goal 4 (Week 3): Pt will ambulate at least 10' with modA +1 and LRAD.  Skilled Therapeutic Interventions/Progress Updates: Pt presented in [place] agreeable to therapy. Pt c/o some increased pain in L knee, premedicated. PTA donned brace and rest breaks provided as needed during session. Pt navigated PWC to main gym and performed Stedy transfer to high/low mat with minA. Pt then participated in Sit to stand from 23in surface for BLE strengthening and pre-gait activity. Pt performed a total of x 4 stands consistently requiring modA. Three of four times pt was able to adequately perform forward weight shifting to achieve near upright position. Pt then transferred back to [facility] using standard (not bariatric). Pt was able to transfer and clear hips in [place] without too much difficulty (trialed standard Stedy as anticipate what pt will use at home). Pt was able to sit halfway forward in cushion and with cues to lean forward pt was able to appropriately scoot backwards in chair. Pt then navigated back to room with supervision with PTA increasing speed by 1 level. Discussed future treatment session incorporating use of PWC in more crowded settings and testing varying speeds. Pt left in [place] at end of session with call bell within reach and current needs met.        Therapy Documentation Precautions:  Precautions Precautions: Shoulder Type of Shoulder Precautions: R-shoulder pain with  ROM/WB Precaution Comments: History of frequent falls at home. Restrictions Weight Bearing Restrictions: No General:   Vital Signs:   Pain:   Mobility:   Locomotion :    Trunk/Postural Assessment :    Balance:   Exercises:   Other Treatments:      Therapy/Group: Individual Therapy  Almeta Geisel 12/29/2022, 4:05 PM

## 2022-12-29 NOTE — Progress Notes (Signed)
Physical Therapy Session Note  Patient Details  Name: [name] MRN: [id number] Date of Birth: 09/19/1951  Today's Date: 12/29/2022 PT Individual Time: 0920-1003 PT Individual Time Calculation (min): 43 min   Short Term Goals: Week 3:  PT Short Term Goal 1 (Week 3): Pt will perform supine<>sit with minA consistently. PT Short Term Goal 2 (Week 3): Pt will performs sit<>stand using LRAD with modA consistently. PT Short Term Goal 3 (Week 3): Pt will perform bed<>chair transfer consistently with modA PT Short Term Goal 4 (Week 3): Pt will ambulate at least 10' with modA +1 and LRAD.  Skilled Therapeutic Interventions/Progress Updates:    Pt received sitting in power wheelchair and agreeable to therapy session. Nurse present for pain medication administration. Pt performed power w/c mobility ~129f to ortho gym with supervision and reinforcing education on importance of turning off wheelchair when sitting at rest.   Sit>stand PWC>stedy with CGA. Stedy transfer to mat.   Sit<>stand retraining from 22in height mat to RW x5 reps with mod assist for lifting to stand/balance due to posterior lean bias especially once her hips were lifted from the mat. Pt requires ~15-20seconds once in standing to orient to midline (not lean posteriorly against therapist support) and achieve upright standing with increased B LE knee extension - improved after providing pt with mirror feedback as pt able to see when her feet are also not placed correctly or when she is using them to push posteriorly. Pt continues to have significant fear of falling while standing but improving allowing pt to attend to cuing better to stand with less assistance and maintain static standing up to ~30seconds.  Able to progress 1x to side stepping 1 step in each direction with therapist providing total assist for RW management and mirror feedback still - pt does better stepping towards L compared to R.   Pt fatigued after 5  sit<>stands.  [facility] to come to standing from mat. Transported back to her room. At end of session, pt left seated in [place] in reach.  Therapy Documentation Precautions:  Precautions Precautions: Shoulder Type of Shoulder Precautions: R-shoulder pain with ROM/WB Precaution Comments: History of frequent falls at home. Restrictions Weight Bearing Restrictions: No   Pain:  Reporting increasing L knee pain towards end of session - premedicated - provided rest break.   Therapy/Group: Individual Therapy  CTawana Scale, PT, DPT, NCS, CSRS 12/29/2022, 7:51 AM

## 2022-12-29 NOTE — Progress Notes (Signed)
Hypoglycemic Event  CBG: 46   Treatment: 4 oz juice/soda  Symptoms: None  Follow-up CBG: [id number] CBG Result:65;82   Possible Reasons for Event: Unknown  Comments/MD notified:incoming nurse will notify MD - Loma Sousa, LPN    Faithanne Verret Wynne Dust

## 2022-12-29 NOTE — Progress Notes (Signed)
Patient with recurrent hypoglycemic event after 2 units novolog at bedtime for past 2 nights. Will change SSI to sensitive scale, d/c night time coverage and monitor for now.

## 2022-12-29 NOTE — Progress Notes (Addendum)
Patient ID: [name], female   DOB: 08/01/1951, 72 y.o.   MRN: [id number]  [facility] in anticipation of wheelchair evaluation. Rosita-PT to reach out to set up.  3:00 pm Will need to use [employer] due to [employer]a [employer] is out of network. Rosita-PT aware and will reach out to them to schedule wheelchair evaluation.

## 2022-12-29 NOTE — Progress Notes (Signed)
Occupational Therapy Session Note  Patient Details  Name: [name] MRN: [id number] Date of Birth: [birth year]  Session 1 Today's Date: 12/29/2022 OT Individual Time: [id number] OT Individual Time Calculation (min): 73 min   Session 2 Today's Date: 12/29/2022 OT Individual Time: 1300-1400 OT Individual Time Calculation (min): 60 min    Short Term Goals: Week 4:  OT Short Term Goal 1 (Week 4): STG= LTG d/t ELOS  Skilled Therapeutic Interventions/Progress Updates:    Session 1 Pt received supine with no c/o pain, agreeable to OT session. Pt agreeable to take shower today. She came to EOB with (S). She stood in the stedy with CGA. Transfer to [facility]. She voided urine and BM and was able to complete necessary hygiene with CGA seated. Improved confidence sitting on BSC. Sit > stand in the stedy with min A from the [facility]. She was transferred into the shower via stedy to bariatric [facility] facing out. She required support under her feet via trash can to find trunk stability. She washed UB with close (S) and management of water- using both washcloth and LH sponge. She required cueing for technique to reach her bottom but was able to do so with CGA. She completed sit > stand from shower with min A. She was transferred back to the power w/c. She required education on w/c functions to make scooting back easier and discussed lifting feature to make sit > stand easier. She required seat elevation to stand, requiring min A. She donned a shirt with (S) seated. She donned pants with max A 2/2 time constraints. She was left sitting up in the power w/c with all needs met.     Session 2 Pt received sitting in the power w/c with no c/o pain agreeable to session. She was able to navigate the w/c to the therapy gym with (S), slow pace. Worked on slideboard transfer to the mat with heavy focus on body mechanics to ease transfer. She was successful for 75% of transfer with only CGA but mod A to finish transfer. She completed  3x total slideboard with no more than min A but lots of effort from pt and she feels discouraged with use. She required frequent cueing for head-hips relationship and forward weight shift. She ended with blocked practice sit <>stand requiring mod A consistently to bring hips forward and elevate glutes. Heavy cueing for forward weight shift and glute activation. Stedy used to transfer pt back to the power w/c with CGA overall. She returned to her room and was left sitting up with all needs met.    Therapy Documentation Precautions:  Precautions Precautions: Shoulder Type of Shoulder Precautions: R-shoulder pain with ROM/WB Precaution Comments: History of frequent falls at home. Restrictions Weight Bearing Restrictions: No   Therapy/Group: Individual Therapy  Curtis Sites 12/29/2022, 6:42 AM

## 2022-12-30 DIAGNOSIS — N3000 Acute cystitis without hematuria: Secondary | ICD-10-CM

## 2022-12-30 DIAGNOSIS — N179 Acute kidney failure, unspecified: Secondary | ICD-10-CM

## 2022-12-30 LAB — URINALYSIS, W/ REFLEX TO CULTURE (INFECTION SUSPECTED)
Bilirubin Urine: NEGATIVE
Glucose, UA: NEGATIVE mg/dL
Ketones, ur: 5 mg/dL — AB
Nitrite: POSITIVE — AB
Protein, ur: 100 mg/dL — AB
Specific Gravity, Urine: 1.012 (ref 1.005–1.030)
WBC, UA: >50 WBC/hpf (ref 0–5)
pH: 5 (ref 5.0–8.0)

## 2022-12-30 LAB — GLUCOSE, CAPILLARY
Glucose-Capillary: 148 mg/dL — ABNORMAL HIGH (ref 70–99)
Glucose-Capillary: 328 mg/dL — ABNORMAL HIGH (ref 70–99)
Glucose-Capillary: 272 mg/dL — ABNORMAL HIGH (ref 70–99)
Glucose-Capillary: 195 mg/dL — ABNORMAL HIGH (ref 70–99)

## 2022-12-30 MED ORDER — INSULIN ASPART 100 UNIT/ML IJ SOLN
3.0000 [IU] | Freq: Three times a day (TID) | INTRAMUSCULAR | Status: DC
  Administered 2022-12-30 – 2023-01-01 (×5): 3 [IU] via SUBCUTANEOUS

## 2022-12-30 MED ORDER — CEPHALEXIN 250 MG PO CAPS
250.0000 mg | ORAL_CAPSULE | Freq: Four times a day (QID) | ORAL | Status: AC
  Administered 2022-12-30 – 2023-01-01 (×7): 250 mg via ORAL
  Filled 2022-12-30 (×7): qty 1

## 2022-12-30 NOTE — Patient Care Conference (Signed)
Inpatient RehabilitationTeam Conference and Plan of Care Update Date: 12/30/2022   Time: 12:01 PM    Patient Name: [name]      Medical Record Number: [id number]  Date of Birth: 1951/02/19 Sex: Female         Room/Bed: 4M08C/4M08C-01 Payor Info: Payor: [place] / Plan: BCBS MEDICARE / Product Type: *No Product type* /    Admit Date/Time:  12/04/2022  3:43 PM  Primary Diagnosis:  [facility] Problems: Principal Problem:   Debility Active Problems:   Essential hypertension   Type 1 diabetes mellitus on insulin therapy ([place])   Primary osteoarthritis of both hands   A-fib ([place])   Chronic diastolic congestive heart failure ([place])   Constipation   Acute cystitis without hematuria    Expected Discharge Date: Expected Discharge Date: 01/06/23  Team Members Present: Physician leading conference: Dr. Jennye Boroughs Social Worker Present: Ovidio Kin, LCSW Nurse Present: Dorien Chihuahua, RN PT Present: Canary Brim, PT OT Present: Willeen Cass, OT PPS Coordinator present : Gunnar Fusi, SLP     Current Status/Progress Goal Weekly Team Focus  Bowel/Bladder   Pt is continent of bowel/bladder   Pt will remain continent of bowel/bladder   Will assess qshift and PRN    Swallow/Nutrition/ Hydration               ADL's   [name] is reliably min-mod A overall. She trialed slideboard several times and does not like this method. She will have to complete LB ADLs in the stedy or at bed level. (S) UB ADLs   min A   ADls, transfers, d/c planning, standing balance, d/c planning    Mobility   minA bed mobility, Stedy transfers with min to modA, stand from elevated mat min to modA, pt can  ambulate  in Lite Gait but unable to tolerate body weight due to increased anxiety and L knee pain, have initiated use of PWC for mobility with Stedy transfer   min assist w/ bed mobility and transfers, mod assist for gait at least 10-15 ft  Transfers, standing  tolerance, use of PWC, family ed    Pensions consultant Observations               Pain   Pt has pain in right shoulder   Voltaren cream soothes pain   Will assess qshift and PRN    Skin   Pt's skin is intact.  Lower extremities dry.   Lotion applied to lower extremities  Will assess qshift and PRN      Discharge Planning:  Working on equipment for home and will have husband and daughter comein for hands on education have already observed. Discussing getting a stedy for home   Team Discussion: Patient continues to present with anxiety; MD adjusted medications.  Patient on target to meet rehab goals: Currently needs supervision for upper body care and min - mod for lower body care.. completes sit - stand with a stedy for ADLs and can complete a slide - board transfer.  *See Care Plan and progress notes for long and short-term goals.   Revisions to Treatment Plan:  Knee brace ordered Diabetic Coordinator referral [employer] Power chair consult   Teaching Needs: Safety, medications, transfers, toileting, skin care, etc.  Current Barriers to Discharge: Decreased caregiver support and Home enviroment access/layout  Possible Resolutions to Barriers: Family education Educational psychologist  DME: W/C, transport chair, Stedy Non-emergent transport for discharge     Medical Summary Current Status: Debility, CHF, OA of knee and shoulder, T1DM with hypoglycemia,possible UTI  Barriers to Discharge: Cardiac Complications;Electrolyte abnormality;Self-care education  Barriers to Discharge Comments: Debility, CHF, OA of knee and shoulder, T1DM with hypoglycemia,possible UTI, anxiety,vertigo Possible Resolutions to [employer] Focus: recheck U/A, continue duloxetine,monitor BMP, continue knee brace   Continued Need for Acute Rehabilitation Level of Care: The patient requires daily medical management by a physician with  specialized training in physical medicine and rehabilitation for the following reasons: Direction of a multidisciplinary physical rehabilitation program to maximize functional independence : Yes Medical management of patient stability for increased activity during participation in an intensive rehabilitation regime.: Yes Analysis of laboratory values and/or radiology reports with any subsequent need for medication adjustment and/or medical intervention. : Yes   I attest that I was present, lead the team conference, and concur with the assessment and plan of the team.   Dorien Chihuahua B 12/30/2022, 4:21 PM

## 2022-12-30 NOTE — Progress Notes (Signed)
Patient ID: [name], female   DOB: February 20, 1951, 72 y.o.   MRN: [id number]  Met with pt to give her team conference update progress toward goals this week. Still planning on discharge 2/21 and will have husband and daughter come in for hands on education. Having power wheelchair eval on Friday. Pt is pleased with her progress but feels it is taking long and it's hard to be away from home so long. Will continue to work on discharge needs.

## 2022-12-30 NOTE — Progress Notes (Signed)
Occupational Therapy Session Note  Patient Details  Name: [name] MRN: [id number] Date of Birth: 1951/07/28  Today's Date: 12/30/2022 OT Individual Time: 1335-1430 OT Individual Time Calculation (min): 55 min    Short Term Goals: Week 4:  OT Short Term Goal 1 (Week 4): STG= LTG d/t ELOS  Skilled Therapeutic Interventions/Progress Updates:  Pt received sitting in PWC for skilled OT session with focus on functional transfers, standing tolerance/balance, and simulated LB tasks. Pt agreeable to interventions, demonstrating overall pleasant mood. Pt with no reports of pain, stating "it's hard to reverse in this contraption" in reference to [place]. OT offering intermediate rest breaks and positioning suggestions throughout session to address potential pain/fatigue and maximize participation/safety in session.   Pt self drives/maneuvers PWC to all therapy locations with supervision and min verbal cuing for object avoidance. In therapy gym, pt attempts STS transfer x 3-4 times, but unable to come into full stance despite Max A + RW and heavy cuing for forward weight-shift, pt expressing increased fatigue since earlier PT session. Activity downgraded to include [place] of bariatric stedy for easier transfer, pt performing initial transfer onto stedy with CGA. Sitting EOM, bariatric stedy switched to standard size, as patient will discharge with this size vs bariatric, pt performing transfer with Min A from low mat, sharing "it feels different" when asked to compare experience with different sized equipment.   Pt then participates in table top activity for standing/activity tolerance, requiring multimodal cuing for WOB and decreased reliance on stedy bar as patient was resting stomach on bar. Pt then simulates posterior ADL tasks with use of resistive clips, demonstrating functional reach necessary to complete peri-hygiene and donning clothing garments over bottom.  Pt transferring back into PWC with  standard-sized stedy, requiring heavy multimodal cuing to shift hips towards L-side as both stedy wheels do not fit underneath PWC, technique directed by primary PT present in gym for increased cohesiveness of patient education.   In unit hallway, pt participates in [facility] maneuvering activity as pt was asked to drive in/out of a line of cones to simulate skills needed to maneuver in home environment. Pt completed activity with 2 errors, running over cones with back wheels.   Pt remained sitting in [facility] with all immediate needs met at end of session. Pt continues to be appropriate for skilled OT intervention to promote further functional independence.   Therapy Documentation Precautions:  Precautions Precautions: Shoulder Type of Shoulder Precautions: R-shoulder pain with ROM/WB Precaution Comments: History of frequent falls at home. Restrictions Weight Bearing Restrictions: No   Therapy/Group: Individual Therapy  Maudie Mercury, OTR/L, MSOT  12/30/2022, 2:03 PM

## 2022-12-30 NOTE — Progress Notes (Signed)
Physical Therapy Session Note  Patient Details  Name: [name] MRN: [id number] Date of Birth: 1951/08/15  Today's Date: 12/30/2022 PT Individual Time: 1100-1200 PT Individual Time Calculation (min): 60 min   Short Term Goals: Week 3:  PT Short Term Goal 1 (Week 3): Pt will perform supine<>sit with minA consistently. PT Short Term Goal 2 (Week 3): Pt will performs sit<>stand using LRAD with modA consistently. PT Short Term Goal 3 (Week 3): Pt will perform bed<>chair transfer consistently with modA PT Short Term Goal 4 (Week 3): Pt will ambulate at least 10' with modA +1 and LRAD.  Skilled Therapeutic Interventions/Progress Updates: Pt presented in [place] agreeable to therapy. Pt states mild unrated pain in L knee. Rest breaks and repositioning provided as needed during session. Pt navigated with supervision to day room with PTA encouraging pt to increase speed to second level. Pt did stop a few times in hallway when a cart or bed was passing by even through there was significant room avail. In day room pt performed Stedy transfer to high/low mat with CGA and transferred to high/low mat. Lattie Haw, RT arrived and was present during remainder of session. At mat pt performed Sit to stand from 20in mat with modA. In standing pt was able to maintain upright position and minA and cues to increase anterior weight shifting. On next stand pt began incorporating lateral weight shifting. Pt was able to stand with minA this time and performed weight shifting x 10. On third stand pt performed same activity and with encouragement pt was able to incorporate small marches in addition to lateral shifts. The next stand required minA to stand and pt was able to incorporate standing marches more consistently. During next stand pt required modA and some increased time to shift weight forward but pt was able to attain with min to modA and increased time. Pt then was able to perform lateral steps to L with PTA providing minA and  assisting in managing RW.  On last stand pt required modA to stand but was able to perform step pivot transfer to [facility] with modA and +2 present for safety! Once in chair pt was able to scoot posteriorly with feet on floor to position correctly in chair. Pt then navigated with supervision back to room. Pt left in room at end of session with Lattie Haw RT present and current needs met.      Therapy Documentation Precautions:  Precautions Precautions: Shoulder Type of Shoulder Precautions: R-shoulder pain with ROM/WB Precaution Comments: History of frequent falls at home. Restrictions Weight Bearing Restrictions: No General:   Vital Signs: Therapy Vitals Temp: 98.6 F (37 C) Pulse Rate: 90 Resp: 18 BP: 121/72 Patient Position (if appropriate): Lying Oxygen Therapy SpO2: 99 % O2 Device: Room Air Pain:   Mobility:   Locomotion :    Trunk/Postural Assessment :    Balance:   Exercises:   Other Treatments:      Therapy/Group: Individual Therapy  Eaton Folmar 12/30/2022, 3:11 PM

## 2022-12-30 NOTE — Progress Notes (Addendum)
PROGRESS NOTE   Subjective/Complaints: No acute events overnight.  Working in the gym this AM, working on standing. Therapy reports gradual progress. No new concerns.   LBM today  Review of Systems  Constitutional:  Negative for chills, fever and malaise/fatigue.  Eyes:        Baseline blind R eye  Respiratory:  Negative for shortness of breath.   Cardiovascular:  Negative for chest pain.  Gastrointestinal:  Negative for abdominal pain, constipation, heartburn, nausea and vomiting.  Genitourinary:  Positive for dysuria. Negative for frequency, hematuria and urgency.  Musculoskeletal:  Positive for joint pain.  Skin:  Negative for rash.  Neurological:  Positive for sensory change. Negative for dizziness (transient episodes) and headaches.  Psychiatric/Behavioral:  The patient is nervous/anxious.     Objective:   No results found. Recent Labs    12/28/22 0912  WBC 6.1  HGB 14.9  HCT 46.8*  PLT 191    Recent Labs    12/29/22 0533 12/29/22 1800  NA 137 134*  K 3.9 4.9  CL 98 95*  CO2 29 31  GLUCOSE 53* 266*  BUN 19 20  CREATININE 0.65 0.77  CALCIUM 9.6 9.9      Shoulder Xray 11/30/22 Postsurgical changes are noted in the proximal right humerus with deformity identified. Underlying bony thorax appears within normal limits. No fracture or dislocation is seen. Stable bone island is noted in the mid humerus. No new focal abnormality is seen.   IMPRESSION: Postsurgical  changes without acute abnormality.     Intake/Output Summary (Last 24 hours) at 12/30/2022 0827 Last data filed at 12/29/2022 1700 Gross per 24 hour  Intake 832 ml  Output --  Net 832 ml         Physical Exam: Vital Signs Blood pressure 132/66, pulse 86, temperature 97.9 F (36.6 C), temperature source Oral, resp. rate 16, height 5' 7"$  (1.702 m), weight 84.5 kg, SpO2 95 %.  Physical Exam Constitutional: No distress . Vital signs  reviewed. Standing in stander HEENT: NCAT, conjugate gaze, oral membranes moist Neck: supple Cardiovascular: RRR without murmur. No JVD    Respiratory/Chest: CTA Bilaterally without wheezes or rales. Normal effort    GI/Abdomen: BS +, non-tender, non-distended Ext: no clubbing, cyanosis, or edema Psych: pleasant and cooperative  Skin: BLE with ichthyosis. RLE mepilex dressing in place. Finger bilateral hand flushed appearing. Trace BLE edema today.   Neurologic/MsK:   Sensory exam: revealed reduced sensation to light touch over right plantar foot and heel. Motor exam: Moving all 4 extremities to gravity R shoulder tenderness with palpation and Rom in all directions.  L knee brace in place No abnormal tone noted Decreased standing balance  Prior Exam      Strength:                RUE: 4/5 SA, 5-/5 EF, 5-/5 EE, 5/5 WE, 5/5 FF, 5/5 FA                 LUE: 5/5 SA, 5/5 EF, 5/5 EE, 5/5 WE, 5/5 FF, 5/5 FA                 RLE: 3+/5 HF,  4+/5 KE, 4/5 DF,  5/5 PF                 LLE:  3-/5 HF, 3+/5 KE, 5/5 DF,  5/5 PF  Coordination: Fine motor coordination was normal.      Assessment/Plan: 1. Functional deficits which require 3+ hours per day of interdisciplinary therapy in a comprehensive inpatient rehab setting. Physiatrist is providing close team supervision and 24 hour management of active medical problems listed below. Physiatrist and rehab team continue to assess barriers to discharge/monitor patient progress toward functional and medical goals  Care Tool:  Bathing    Body parts bathed by patient: Right arm, Chest, Abdomen, Front perineal area, Right upper leg, Left upper leg, Face, Left arm, Buttocks, Left lower leg, Right lower leg   Body parts bathed by helper: Left arm, Buttocks, Right lower leg, Left lower leg     Bathing assist Assist Level: Contact Guard/Touching assist (shower on BSC)     Upper Body Dressing/Undressing Upper body dressing   What is the patient wearing?:  Pull over shirt    Upper body assist Assist Level: Supervision/Verbal cueing    Lower Body Dressing/Undressing Lower body dressing      What is the patient wearing?: Incontinence brief, Pants     Lower body assist Assist for lower body dressing: Moderate Assistance - Patient 50 - 74% (in the stedy)     [place] for toileting: Moderate Assistance - Patient 50 - 74%     Transfers Chair/bed transfer  Transfers assist     Chair/bed transfer assist level: Dependent - mechanical lift     Locomotion Ambulation   Ambulation assist   Ambulation activity did not occur: Safety/medical concerns          Walk 10 feet activity   Assist  Walk 10 feet activity did not occur: Safety/medical concerns        Walk 50 feet activity   Assist Walk 50 feet with 2 turns activity did not occur: Safety/medical concerns         Walk 150 feet activity   Assist Walk 150 feet activity did not occur: Safety/medical concerns         Walk 10 feet on uneven surface  activity   Assist Walk 10 feet on uneven surfaces activity did not occur: Safety/medical concerns         Wheelchair     Assist Is the patient using a wheelchair?: Yes Type of Wheelchair: Manual    Wheelchair assist level: Dependent - Patient 0% Max wheelchair distance: 150    Wheelchair 50 feet with 2 turns activity    Assist        Assist Level: Dependent - Patient 0%   Wheelchair 150 feet activity     Assist      Assist Level: Dependent - Patient 0%   Blood pressure 132/66, pulse 86, temperature 97.9 F (36.6 C), temperature source Oral, resp. rate 16, height 5' 7"$  (1.702 m), weight 84.5 kg, SpO2 95 %.  Medical Problem List and Plan: 1. Functional deficits secondary to debility due to acute on chronic HFpEF, atrial fibrillation with RVR             -patient may shower             -ELOS/Goals: 2/21, min A/mod A for gait at least  10-15 feet  -Continue CIR, PT/OT  -Team conference today please see physician documentation under team  conference tab, met with team  to discuss problems,progress, and goals. Formulized individual treatment plan based on medical history, underlying problem and comorbidities.     2.  Antithrombotics: -DVT/anticoagulation:  Lovenox 40 mg daily (No full-dose AC d/t Hx SAH 2007)             -antiplatelet therapy: ASA 325 daily per IM until OP cards f/u 3. Pain Management:  Oxycodone or tramadol prn. Kpad ordered -encourage use of Voltaren gel 2g QID for OA right shoulder/left knee.  4. Mood/Behavior/Sleep: LCSW to follow for evaluation and support.              -Melatonin prn--12/05/22 ordered as scheduled 33m QHS, Trazodone PRN   -12/06/22 sleeping improving, continue current regimen -12/12/22 wants melatonin to be changed to PRN, ordered this; advised she will need to request sleep meds if she needs them             -antipsychotic agents: N/A  -Nortriptyline 172mQHS, Neurontin 10062mHS 5. Neuropsych/cognition: This patient is capable of making decisions on her own behalf.  -start xanax 0.25 BID PRN for anxiety -Consulted Psychiatry- psychiatry reviewed her case and case discussed with  TakSheran Favasych recommended continuation current medications encourage therapy and did not feel this issue would benefit from inpatient psych, ok to cancel consult.   -1/31 start low dose duloxetine 48m25miscussed risks and benefits of this medication,may also help with joint pain -2/2 increase duloxetine to 30mg31mly -2/7 increased duloxetine to 60mg 68m2/13 anxiety a little improved per patient, continue duloxetine  6. Skin/Wound Care: Add lotion to BLE and hands             -routine pressure relief measures.  7. Fluids/Electrolytes/Nutrition: Monitor I/O. Intake improving. Monitor weekly BMPs, next 12/28/22 -May need to stay dry to avoid fluid overload. 8. Acute on chronic diastolic CHF: Strict  I/O w/daily weights. -Cardiac diet. Monitor for signs of overload.  -continue Lasix 40mg Q73mldactone 12.5mg QD,49mtoprolol 100mg QD,41mvastatin 40mg QD a37mosartan 50mg QD -m10mor for recurrent hyperkalemia (Losartan dose decreased 01/14) -2/1 A little up from a few days ago but appears stable overall -2/14 wt stable overall  Filed Weights   12/28/22 0503 12/29/22 0500 12/30/22 0340  Weight: 86 kg 85.9 kg 84.5 kg     9. A Fib w/RVR: Monitor HR TID--continue Cardizem 180mg QD and48moprolol 100mg QD  10.68m: BPs with some lability, 130s-160s SBP -12/05/22 monitor for now, Losartan decreased 11/29/22, could consider going back to home dosing if appropriate -1/23 BP has been elevated, will increase losartan to 50mg  -2/13 w55mcontrolled overall, decrease losartan dose to 25mg due to K+60mvated -2/14 stable after change in losartan  Vitals:   12/26/22 0324 12/26/22 1416 12/26/22 1917 12/27/22 0455  BP: 130/72 126/63 (!) 140/73 (!) 141/62   12/27/22 1658 12/27/22 2030 12/28/22 0428 12/28/22 1311  BP: (!) 158/87 134/84 131/82 126/79   12/28/22 1947 12/29/22 1500 12/29/22 2042 12/30/22 0340  BP: 118/62 120/60 134/66 132/66     11.  T1DM: Hgb A1c- 7.4. Monitor BS ac/hs and use SSI for elevated BS             -continue insulin glargline 15 units daily.  -May need to add meal coverage-->used 5- 7units tid prn PTA.  -12/05/22 increase Semglee to 16U QD -1/24 add 2 units mealtime insulin -1/25 increase mealtime to 4 units insulin, decrease lantus to 14 units -1/26 decrease mealtime insulin  to 3 units due to hypoglycemia, semglee to AM -1/31 trending a little lower, decrease mealtime insulin dose to 2 units -2/1 decrease semglee to 11 units from 14units, recommend nighttime snack -2/2 decreased to moderate resistance correctional SSI -12/20/22 increase mealtime novolog to 3U again and leave Semglee as is -12/21/22 increase semglee to 13u -2/9 will adjust semglee to 16units, stop  mealtime- per pharm recs -2/12 CBG's with a big drop today after being in the 200's-->No recorded intake after breakfast 2/11. -2/13 Decrease semglee to 13 units due to hypoglycemia, I suspect she does not eat consistent carbs with each meal  -2/14 HS insulin and  SSI insulin scale was decreased, will consult DM coordinator  CBG (last 3)  Recent Labs    12/29/22 1755 12/29/22 2034 12/30/22 0600  GLUCAP 286* 228* 148*      12. Chronic bilateral shoulder and left knee pain: Oxycodone prn -Refusing voltaren gel for local measures-->encourage use             -Aquathermia for local measures.              -L knee: Follows OP ortho, no benefit from injections in the past  -L knee pain improved with knee brace  13. Upper respiratory infection, Nocturnal hypoxia: Question sleep apnea.  -Encourage pulmonary hygiene -Respiratory viral panel negative x2 inpatient -Remains with cough, intermittent SOB - add PRN inhaler, monitor  14.  Pre-renal azotemia: Likely due to diuresis. BUN up but SCr improving.   -Continue to monitor.  -2/12 Bun and Cr stable  Recheck tomorrow   15. Overactive bladder, incontinence: hx of, on Myrbetriq 35m QD; now off purewick -12/05/22 having increased frequency and incontinence, ordered U/A, UCx, and scheduled voiding program -12/06/22 U/A c/w UTI, see #16 -Continue myrbetriq,she is mostly continent with occasional incontinent episodes -12/28/22 continues to have improvement, cont regimen  16. UTI 12/06/22:  -U/A c/w UTI, ordered Macrobid 107mBID x5 days (12/06/22>>12/10/22 last dose), UCx pending- Ecoli sensitive to macrobid, completed course, symptoms resolved -2/7 cloudy urine yesterday, asymptomatic, continue to monitor -2.14 having intermittant burning with urination, check U/A and culture  17.  Hypokalemia  -3056mx2, recheck tomorrow  -K+ up to 3.6, losartan increased today, Recheck tomorrow -1/25 K+ now elevated hyperkalemia at 5.4, lokelma 5g  x1, stop  potassium supplementation, discussed with pharmacy, recheck tomorrow -2/12--5.3 no hemolysis reported on bmet. No scheduled potassium today. Give small dose of lokelma today. Also on losartan and aldactone---hold aldactone tomorrow pending repeat bmet. Consider stopping losartan too if bp's are controlled.  -2/13 K+ down to 3.9, decrease losartan dose to 68m7mecheck BMET tomorrow  18. Dizziness -1/22 Will start meclizine 12.5mg 73m, check orthostatic VS -She has HX of vertigo going back to 2013, seen by Dr. Wong Jacelyn [facility], felt to be related to vestibulopathy -1/30 fall yesterday but no injury -2/2 increase meclizine to 68mg 52mprn -12/28/22 --did not report dizziness. Continue meclizine prn  19. Constipation: reported as chronic  -Schedule miralax QD, PRN dulcolax supp and fleet enema -1/26 had vomiting with  sorbitol yesterday, continue miralax daily, she had large BM yesterday -1/29 miralax increased to BID -1/30 she declines stronger medication at this time,order sorbitol PRN -2/1 large bm yesterday, improved  -12/19/22 now having increased stool frequency, wants to change Miralax to BID PRN rather than scheduled; also wants Colace 100mg B65mRN in case she needs it; knows to ask -12/28/22 multiple stools over last 24 hours--on no scheduled bowel meds -2/14 LBM  today, liquid, does not appear to be on laxatives scheduled, continue monitor  20. R shoulder pain  -Xray on 1/15 with postsurgical changes, continue voltaren gel, Kpad  -1/31 Duloxetine started as this may help with joint pain also -2/2 Duloxetine increase to 17m -2/7 increased duloxetine to 668m  LOS: 26 days A FACE TO FACE EVALUATION WAS PERFORMED  YuJennye Boroughs/14/2024, 8:27 AM

## 2022-12-30 NOTE — Inpatient Diabetes Management (Signed)
Inpatient Diabetes Program Recommendations  AACE/ADA: New Consensus Statement on Inpatient Glycemic Control (2015)  Target Ranges:  Prepandial:   less than 140 mg/dL      Peak postprandial:   less than 180 mg/dL (1-2 hours)      Critically ill patients:  140 - 180 mg/dL   Lab Results  Component Value Date   GLUCAP 148 (H) 12/30/2022   HGBA1C 7.4 (H) 11/27/2022    Review of Glycemic Control  Latest Reference Range & Units 12/29/22 15:08 12/29/22 16:04 12/29/22 17:55 12/29/22 20:34 12/30/22 06:00  Glucose-Capillary 70 - 99 mg/dL 390 (H) 355 (H) 286 (H) 228 (H) 148 (H)  (H): Data is abnormally high Diabetes history: Type 2 DM Outpatient Diabetes medications: Lantus 10 units QD, Humalog TID  Current orders for Inpatient glycemic control: Semglee 13 units QD, Novolog 0-9 units TID  Inpatient Diabetes Program Recommendations:    Noted hypoglycemic events and changes made to insulin regimen.   Also, Consider: - Adding Novolog 3 units TID (assuming patient is consuming >50% of meals)  Thanks, Bronson Curb, MSN, RNC-OB Diabetes Coordinator [phone] (8a-5p)

## 2022-12-30 NOTE — Progress Notes (Signed)
Physical Therapy Session Note  Patient Details  Name: [name] MRN: [id number] Date of Birth: 1951/06/07  Today's Date: 12/30/2022 PT Individual Time: 0802-0925 PT Individual Time Calculation (min): 83 min   Short Term Goals: Week 1:  PT Short Term Goal 1 (Week 1): pt will be mod assist in slide board transfers PT Short Term Goal 1 - Progress (Week 1): Not met PT Short Term Goal 2 (Week 1): pt will be mod assist with bed mobility PT Short Term Goal 2 - Progress (Week 1): Met PT Short Term Goal 3 (Week 1): pt will be maxA x1 w/ sit to stand trasnfer PT Short Term Goal 3 - Progress (Week 1): Met Week 2:  PT Short Term Goal 1 (Week 2): Pt will perform supine<>sit min assist using bed features PT Short Term Goal 1 - Progress (Week 2): Progressing toward goal PT Short Term Goal 2 (Week 2): Pt will perform sit<>stands using LRAD with no more than mod assist consistently PT Short Term Goal 2 - Progress (Week 2): Progressing toward goal PT Short Term Goal 3 (Week 2): Pt will perform bed<>chair transfers using LRAD with no more than mod assist PT Short Term Goal 3 - Progress (Week 2): Progressing toward goal PT Short Term Goal 4 (Week 2): Pt will ambulate at least 59f using litegait for safety but no BWS using LRAD PT Short Term Goal 4 - Progress (Week 2): Progressing toward goal Week 3:  PT Short Term Goal 1 (Week 3): Pt will perform supine<>sit with minA consistently. PT Short Term Goal 2 (Week 3): Pt will performs sit<>stand using LRAD with modA consistently. PT Short Term Goal 3 (Week 3): Pt will perform bed<>chair transfer consistently with modA PT Short Term Goal 4 (Week 3): Pt will ambulate at least 10' with modA +1 and LRAD.  Skilled Therapeutic Interventions/Progress Updates:    Pt received seating upright in PWC and agreeable to therapy. Reports 5/10 L knee and R shoulder pain.   PWC mobility: pt transported self from room to therapy gym ~175 Min A for turning and backing up at  the start of session. By the end pt was independent w/ PWC navigating    Sit to stand using stedy, [facility].   Sit to stand 6x from 17" height mat using EEthelene HalMod A for anterior wt shift w/ PT Tech blocking R foot from sliding . Pt showed apprehension to movement. Encouragement assisted in pt self-efficacy. Pt tolerated standing for 30 secs each. Fear played a part in pt's tolerance. Verbal cueing to move hips forwards and extend L knee slightly assisted in pt standing upright. Mirror in front for visual cueing.   Seated lateral, forward, backward wt shifts for core strength and to help pt orient to midline. 5x in each direction. Pt expressed that being in midline felt "odd" because she's so used to being oriented more to the R.   [facility] in reach and all needs met. Therapist alerted pt's nurse to assist in administering pt's pain medication.   Therapy Documentation Precautions:  Precautions Precautions: Shoulder Type of Shoulder Precautions: R-shoulder pain with ROM/WB Precaution Comments: History of frequent falls at home. Restrictions Weight Bearing Restrictions: No General:   Vital Signs:   Pain:   Mobility:   Locomotion :    Trunk/Postural Assessment :  Balance:   Exercises:   Other Treatments:      Therapy/Group: Individual Therapy  [name] 12/30/2022, 7:56 AM

## 2022-12-31 LAB — BASIC METABOLIC PANEL
Anion gap: 7 (ref 5–15)
BUN: 17 mg/dL (ref 8–23)
CO2: 33 mmol/L — ABNORMAL HIGH (ref 22–32)
Calcium: 9.7 mg/dL (ref 8.9–10.3)
Chloride: 98 mmol/L (ref 98–111)
Creatinine, Ser: 0.74 mg/dL (ref 0.44–1.00)
GFR, Estimated: >60 mL/min (ref >60)
Glucose, Bld: 100 mg/dL — ABNORMAL HIGH (ref 70–99)
Potassium: 3.6 mmol/L (ref 3.5–5.1)
Sodium: 138 mmol/L (ref 135–145)

## 2022-12-31 LAB — GLUCOSE, CAPILLARY
Glucose-Capillary: 81 mg/dL (ref 70–99)
Glucose-Capillary: 256 mg/dL — ABNORMAL HIGH (ref 70–99)
Glucose-Capillary: 292 mg/dL — ABNORMAL HIGH (ref 70–99)
Glucose-Capillary: 146 mg/dL — ABNORMAL HIGH (ref 70–99)

## 2022-12-31 NOTE — Progress Notes (Signed)
PROGRESS NOTE   Subjective/Complaints: No acute events overnight. She was started on ABX for UTI yesterday. She reports she usually needs to be treated 2x when she has a UTI. She asks why blood was checked this AM, discussed this was to monitor K+. No additional complaints or concerns.   LBM today  Review of Systems  Constitutional:  Negative for chills and fever.  Eyes:        Baseline blind R eye  Respiratory:  Negative for shortness of breath.   Cardiovascular:  Negative for chest pain.  Gastrointestinal:  Negative for abdominal pain, constipation, heartburn, nausea and vomiting.  Genitourinary:  Positive for dysuria. Negative for flank pain, frequency, hematuria and urgency.  Musculoskeletal:  Positive for joint pain.  Skin:  Negative for rash.  Neurological:  Positive for dizziness (transient episodes) and sensory change. Negative for headaches.  Psychiatric/Behavioral:  The patient is nervous/anxious.     Objective:   No results found. Recent Labs    12/28/22 0912  WBC 6.1  HGB 14.9  HCT 46.8*  PLT 191    Recent Labs    12/29/22 0533 12/29/22 1800  NA 137 134*  K 3.9 4.9  CL 98 95*  CO2 29 31  GLUCOSE 53* 266*  BUN 19 20  CREATININE 0.65 0.77  CALCIUM 9.6 9.9      Shoulder Xray 11/30/22 Postsurgical changes are noted in the proximal right humerus with deformity identified. Underlying bony thorax appears within normal limits. No fracture or dislocation is seen. Stable bone island is noted in the mid humerus. No new focal abnormality is seen.   IMPRESSION: Postsurgical  changes without acute abnormality.     Intake/Output Summary (Last 24 hours) at 12/31/2022 0823 Last data filed at 12/31/2022 0729 Gross per 24 hour  Intake 598 ml  Output --  Net 598 ml         Physical Exam: Vital Signs Blood pressure 133/70, pulse 82, temperature 97.6 F (36.4 C), resp. rate 17, height 5' 7"$  (1.702  m), weight 86.7 kg, SpO2 96 %.  Physical Exam Constitutional: No distress . Vital signs reviewed. Sitting in power WC.  HEENT: NCAT, conjugate gaze, oral membranes moist Neck: supple Cardiovascular: RRR without murmur. No JVD    Respiratory/Chest: CTA Bilaterally without wheezes or rales. Normal effort    GI/Abdomen: BS +, non-tender, non-distended Ext: no clubbing, cyanosis, or edema Psych: pleasant and cooperative  Skin: BLE with ichthyosis. RLE mepilex dressing in place. Finger bilateral hand flushed appearing. Trace BLE edema today.   Neurologic/MsK:   Sensory exam: revealed reduced sensation to light touch over right plantar foot and heel. Motor exam: Moving all 4 extremities to gravity R shoulder tenderness with palpation and Rom in all directions.  L knee joint line tenderness No abnormal tone noted   Prior Exam      Strength:                RUE: 4/5 SA, 5-/5 EF, 5-/5 EE, 5/5 WE, 5/5 FF, 5/5 FA                 LUE: 5/5 SA, 5/5 EF, 5/5 EE, 5/5 WE, 5/5  FF, 5/5 FA                 RLE: 3+/5 HF, 4+/5 KE, 4/5 DF,  5/5 PF                 LLE:  3-/5 HF, 3+/5 KE, 5/5 DF,  5/5 PF  Coordination: Fine motor coordination was normal.      Assessment/Plan: 1. Functional deficits which require 3+ hours per day of interdisciplinary therapy in a comprehensive inpatient rehab setting. Physiatrist is providing close team supervision and 24 hour management of active medical problems listed below. Physiatrist and rehab team continue to assess barriers to discharge/monitor patient progress toward functional and medical goals  Care Tool:  Bathing    Body parts bathed by patient: Right arm, Chest, Abdomen, Front perineal area, Right upper leg, Left upper leg, Face, Left arm, Buttocks, Left lower leg, Right lower leg   Body parts bathed by helper: Left arm, Buttocks, Right lower leg, Left lower leg     Bathing assist Assist Level: Contact Guard/Touching assist (shower on BSC)     Upper Body  Dressing/Undressing Upper body dressing   What is the patient wearing?: Pull over shirt    Upper body assist Assist Level: Supervision/Verbal cueing    Lower Body Dressing/Undressing Lower body dressing      What is the patient wearing?: Incontinence brief, Pants     Lower body assist Assist for lower body dressing: Moderate Assistance - Patient 50 - 74% (in the stedy)     [place] for toileting: Moderate Assistance - Patient 50 - 74%     Transfers Chair/bed transfer  Transfers assist     Chair/bed transfer assist level: Dependent - mechanical lift     Locomotion Ambulation   Ambulation assist   Ambulation activity did not occur: Safety/medical concerns          Walk 10 feet activity   Assist  Walk 10 feet activity did not occur: Safety/medical concerns        Walk 50 feet activity   Assist Walk 50 feet with 2 turns activity did not occur: Safety/medical concerns         Walk 150 feet activity   Assist Walk 150 feet activity did not occur: Safety/medical concerns         Walk 10 feet on uneven surface  activity   Assist Walk 10 feet on uneven surfaces activity did not occur: Safety/medical concerns         Wheelchair     Assist Is the patient using a wheelchair?: Yes Type of Wheelchair: Manual    Wheelchair assist level: Dependent - Patient 0% Max wheelchair distance: 150    Wheelchair 50 feet with 2 turns activity    Assist        Assist Level: Dependent - Patient 0%   Wheelchair 150 feet activity     Assist      Assist Level: Dependent - Patient 0%   Blood pressure 133/70, pulse 82, temperature 97.6 F (36.4 C), resp. rate 17, height 5' 7"$  (1.702 m), weight 86.7 kg, SpO2 96 %.  Medical Problem List and Plan: 1. Functional deficits secondary to debility due to acute on chronic HFpEF, atrial fibrillation with RVR             -patient may shower              -ELOS/Goals: 2/21, min A/mod A for  gait at least 10-15 feet  -Continue CIR, PT/OT  -Power WC eval tomorrow    2.  Antithrombotics: -DVT/anticoagulation:  Lovenox 40 mg daily (No full-dose AC d/t Hx SAH 2007)             -antiplatelet therapy: ASA 325 daily per IM until OP cards f/u 3. Pain Management:  Oxycodone or tramadol prn. Kpad ordered -encourage use of Voltaren gel 2g QID for OA right shoulder/left knee.  4. Mood/Behavior/Sleep: LCSW to follow for evaluation and support.              -Melatonin prn--12/05/22 ordered as scheduled 62m QHS, Trazodone PRN   -12/06/22 sleeping improving, continue current regimen -12/12/22 wants melatonin to be changed to PRN, ordered this; advised she will need to request sleep meds if she needs them             -antipsychotic agents: N/A  -Nortriptyline 160mQHS, Neurontin 10074mHS 5. Neuropsych/cognition: This patient is capable of making decisions on her own behalf.  -start xanax 0.25 BID PRN for anxiety -Consulted Psychiatry- psychiatry reviewed her case and case discussed with  TakSheran Favasych recommended continuation current medications encourage therapy and did not feel this issue would benefit from inpatient psych, ok to cancel consult.   -1/31 start low dose duloxetine 9m48miscussed risks and benefits of this medication,may also help with joint pain -2/2 increase duloxetine to 30mg90mly -2/7 increased duloxetine to 60mg 57m2/13 anxiety a little improved per patient, continue duloxetine  6. Skin/Wound Care: Add lotion to BLE and hands             -routine pressure relief measures.  7. Fluids/Electrolytes/Nutrition: Monitor I/O. Intake improving. Monitor weekly BMPs, next 12/28/22 -May need to stay dry to avoid fluid overload. 8. Acute on chronic diastolic CHF: Strict I/O w/daily weights. -Cardiac diet. Monitor for signs of overload.  -continue Lasix 40mg Q63mldactone 12.5mg QD,25mtoprolol 100mg QD,8mvastatin 40mg QD a13mosartan  50mg QD -m31mor for recurrent hyperkalemia (Losartan dose decreased 01/14) -2/1 A little up from a few days ago but appears stable overall -2/15 weight trending up a little, continue to monitor  Filed Weights   12/29/22 0500 12/30/22 0340 12/31/22 0521  Weight: 85.9 kg 84.5 kg 86.7 kg     9. A Fib w/RVR: Monitor HR TID--continue Cardizem 180mg QD and38moprolol 100mg QD  10.4m: BPs with some lability, 130s-160s SBP -12/05/22 monitor for now, Losartan decreased 11/29/22, could consider going back to home dosing if appropriate -1/23 BP has been elevated, will increase losartan to 50mg  -2/13 w65mcontrolled overall, decrease losartan dose to 25mg due to K+22mvated -2/15 well controlled  Vitals:   12/27/22 0455 12/27/22 1658 12/27/22 2030 12/28/22 0428  BP: (!) 141/62 (!) 158/87 134/84 131/82   12/28/22 1311 12/28/22 1947 12/29/22 1500 12/29/22 2042  BP: 126/79 118/62 120/60 134/66   12/30/22 0340 12/30/22 1319 12/30/22 2000 12/31/22 0521  BP: 132/66 121/72 127/73 133/70     11.  T1DM: Hgb A1c- 7.4. Monitor BS ac/hs and use SSI for elevated BS             -continue insulin glargline 15 units daily.  -May need to add meal coverage-->used 5- 7units tid prn PTA.  -12/05/22 increase Semglee to 16U QD -1/24 add 2 units mealtime insulin -1/25 increase mealtime to 4 units insulin, decrease lantus to 14 units -1/26 decrease mealtime insulin to 3 units due to hypoglycemia, semglee to AM -  1/31 trending a little lower, decrease mealtime insulin dose to 2 units -2/1 decrease semglee to 11 units from 14units, recommend nighttime snack -2/2 decreased to moderate resistance correctional SSI -12/20/22 increase mealtime novolog to 3U again and leave Semglee as is -12/21/22 increase semglee to 13u -2/9 will adjust semglee to 16units, stop mealtime- per pharm recs -2/12 CBG's with a big drop today after being in the 200's-->No recorded intake after breakfast 2/11. -2/13 Decrease semglee to 13 units  due to hypoglycemia, I suspect she does not eat consistent carbs with each meal  -2/14 HS insulin and  SSI insulin scale was decreased, will consult DM coordinator -2/15 Novolog 3 units TID restarted per DM coordinator recs, appreciate assistance  CBG (last 3)  Recent Labs    12/30/22 1651 12/30/22 2117 12/31/22 0610  GLUCAP 272* 195* 81      12. Chronic bilateral shoulder and left knee pain: Oxycodone prn -Refusing voltaren gel for local measures-->encourage use             -Aquathermia for local measures.              -L knee: Follows OP ortho, no benefit from injections in the past  -L knee pain improved with knee brace  13. Upper respiratory infection, Nocturnal hypoxia: Question sleep apnea.  -Encourage pulmonary hygiene -Respiratory viral panel negative x2 inpatient -Remains with cough, intermittent SOB - add PRN inhaler, monitor  14.  Pre-renal azotemia: Likely due to diuresis. BUN up but SCr improving.   -Continue to monitor.  -2/15 BUN and Cr stable, recheck monday  15. Overactive bladder, incontinence: hx of, on Myrbetriq 83m QD; now off purewick -12/05/22 having increased frequency and incontinence, ordered U/A, UCx, and scheduled voiding program -12/06/22 U/A c/w UTI, see #16 -Continue myrbetriq,she is mostly continent with occasional incontinent episodes -12/28/22 continues to have improvement, cont regimen  16. UTI 12/06/22:  -U/A c/w UTI, ordered Macrobid 1020mBID x5 days (12/06/22>>12/10/22 last dose), UCx pending- Ecoli sensitive to macrobid, completed course, symptoms resolved -2/7 cloudy urine yesterday, asymptomatic, continue to monitor -2.14 having intermittant burning with urination, check U/A and culture -2/15 keflex started for UTI 7 day, follow urine culture   17.  Hypokalemia  -3053mx2, recheck tomorrow  -K+ up to 3.6, losartan increased today, Recheck tomorrow -1/25 K+ now elevated hyperkalemia at 5.4, lokelma 5g  x1, stop potassium supplementation,  discussed with pharmacy, recheck tomorrow -2/12--5.3 no hemolysis reported on bmet. No scheduled potassium today. Give small dose of lokelma today. Also on losartan and aldactone---hold aldactone tomorrow pending repeat bmet. Consider stopping losartan too if bp's are controlled.  -2/13 K+ down to 3.9, decrease losartan dose to 63m58m+ stable 3.6 recheck monday  18. Dizziness -1/22 Will start meclizine 12.5mg 40m, check orthostatic VS -She has HX of vertigo going back to 2013, seen by Dr. Wong [facility], felt to be related to vestibulopathy -1/30 fall yesterday but no injury -2/2 increase meclizine to 63mg 30mprn -12/28/22 --did not report dizziness. Continue meclizine prn  19. Constipation: reported as chronic  -Schedule miralax QD, PRN dulcolax supp and fleet enema -1/26 had vomiting with  sorbitol yesterday, continue miralax daily, she had large BM yesterday -1/29 miralax increased to BID -1/30 she declines stronger medication at this time,order sorbitol PRN -2/1 large bm yesterday, improved  -12/19/22 now having increased stool frequency, wants to change Miralax to BID PRN rather than scheduled; also wants Colace 100mg B60mRN in case she needs it; knows to  ask -12/28/22 multiple stools over last 24 hours--on no scheduled bowel meds -2/14 LBM today, liquid, does not appear to be on laxatives scheduled, continue monitor  20. R shoulder pain  -Xray on 1/15 with postsurgical changes, continue voltaren gel, Kpad  -1/31 Duloxetine started as this may help with joint pain also -2/2 Duloxetine increase to 27m -2/7 increased duloxetine to 647m  LOS: 27 days A FACE TO FACE EVALUATION WAS PERFORMED  YuJennye Boroughs/15/2024, 8:23 AM

## 2022-12-31 NOTE — Progress Notes (Signed)
Physical Therapy Session Note  Patient Details  Name: [name] MRN: [id number] Date of Birth: 26-Sep-1951  Today's Date: 12/31/2022 PT Individual Time: 1045-1200 PT Individual Time Calculation (min): 75 min   Short Term Goals: Week 3:  PT Short Term Goal 1 (Week 3): Pt will perform supine<>sit with minA consistently. PT Short Term Goal 2 (Week 3): Pt will performs sit<>stand using LRAD with modA consistently. PT Short Term Goal 3 (Week 3): Pt will perform bed<>chair transfer consistently with modA PT Short Term Goal 4 (Week 3): Pt will ambulate at least 10' with modA +1 and LRAD.  Skilled Therapeutic Interventions/Progress Updates: Pt presented in [place] agreeable to therapy, Lattie Haw RT present throughout session. Pt c/o mild L knee pain during session. Rest breaks provided during session as needed. Pt navigated PWC to day room with supervision. Performed Stedy transfer to high/low mat with CGA. Pt was able to stand from [place] without using LUE holding onto crossbar. Remainder of session focused on progression of Sit to stand from ~20in height mat. Pt required heavy mod to maxA on first stand with strong posterior push during transition to full stand. Second stand pt with improved anterior weight shifting but continued to require heavy modA to complete stand. On following next 3 stands pt continued to require heavy modA, but with increased time was able to shift weight forward and come to complete full stand with slightly bent knees. Pt was able to perform lateral weight shifting and was able to lift L foot but unable to clear R foot off ground despite increased weight shifting noted. Pt did indicate some increased dizziness as well as increased L knee pain (crepitus noted with each stand). Pt performed additional x 2 stands but noted increased fatigue and increased difficulty weight shifting forward. Pt then transferred back to [facility] with Stedy and pt able to pull self up with CGA. Pt then attempted to  stand from [facility] however required x 2 attempts. On second attempt pt was able to complete stand from [facility] and perform anterior weight shift with modA however was too fatigued to sustain standing for more and 5-7 seconds. Pt then navigated PWC back to room with supervision and with increased time was able to reverse PWC to bedside safely. Pt left in [place] at end of session with call bell within reach and needs met.      Therapy Documentation Precautions:  Precautions Precautions: Shoulder Type of Shoulder Precautions: R-shoulder pain with ROM/WB Precaution Comments: History of frequent falls at home. Restrictions Weight Bearing Restrictions: No General:   Vital Signs: Therapy Vitals Temp: 98 F (36.7 C) Temp Source: Oral Pulse Rate: 81 Resp: 18 BP: 126/71 Patient Position (if appropriate): Sitting Oxygen Therapy SpO2: 100 % O2 Device: Room Air Pain:   Mobility:   Locomotion :    Trunk/Postural Assessment :    Balance:   Exercises:   Other Treatments:      Therapy/Group: Individual Therapy  Quavis Klutz 12/31/2022, 2:47 PM

## 2022-12-31 NOTE — Progress Notes (Signed)
Patient ID: [name], female   DOB: 04/07/1951, 72 y.o.   MRN: [id number]  Spoke with jennifer-daughter to set up family education to do hands on education prior to discharge home. Have scheduled for Tuesday 1:00-3:00 pm. Have let team know.

## 2022-12-31 NOTE — Plan of Care (Signed)
Problem: RH Car Transfers Goal: LTG Patient will perform car transfers with assist (PT) Description: LTG: Patient will perform car transfers with assistance (PT). Outcome: Not Met (add Reason) Flowsheets (Taken 12/31/2022 2124) LTG: Pt will perform car transfers with assist:: (Discontinued/not met goal as pt will require medical transport home) -- Note: Discontinued not met goal as pt will require medical transport home    Problem: RH Ambulation Goal: LTG Patient will ambulate in home environment (PT) Description: LTG: Patient will ambulate in home environment, # of feet with assistance (PT). Outcome: Not Met (add Reason) Flowsheets (Taken 12/31/2022 2124) LTG: Pt will ambulate in home environ  assist needed:: (Discontinued/not met this goal as pt will not be a functional ambulator by D/C) -- Note: Discontinued/not met this goal as pt will not be a functional ambulator by D/C    Problem: RH Stairs Goal: LTG Patient will ambulate up and down stairs w/assist (PT) Description: LTG: Patient will ambulate up and down # of stairs with assistance (PT) Outcome: Not Met (add Reason) Flowsheets (Taken 12/31/2022 2124) LTG: Pt will ambulate up/down stairs assist needed:: (Discontinued/not met this goal as pt will not be a functional ambulator by D/C) -- Note: Discontinued/not met this goal as pt will not be a functional ambulator by D/C    Problem: RH Balance Goal: LTG Patient will maintain dynamic standing balance (PT) Description: LTG:  Patient will maintain dynamic standing balance with assistance during mobility activities (PT) Flowsheets (Taken 12/31/2022 2124) LTG: Pt will maintain dynamic standing balance during mobility activities with:: (Downgraded base on pt's progress) Moderate Assistance - Patient 50 - 74% Note: Downgraded base on pt's progress   Problem: Sit to Stand Goal: LTG:  Patient will perform sit to stand with assistance level (PT) Description: LTG:  Patient will perform sit  to stand with assistance level (PT) Flowsheets (Taken 12/31/2022 2124) LTG: PT will perform sit to stand in preparation for functional mobility with assistance level: (Downgraded base on pt's progress (she will use stedy to transfer at home)) Moderate Assistance - Patient 50 - 74% Note: Downgraded base on pt's progress (she will use stedy to transfer at home)   Problem: RH Bed to Chair Transfers Goal: LTG Patient will perform bed/chair transfers w/assist (PT) Description: LTG: Patient will perform bed to chair transfers with assistance (PT). Flowsheets (Taken 12/31/2022 2124) LTG: Pt will perform Bed to Chair Transfers with assistance level: (Downgraded base on pt's progress (she will use stedy to transfer at home)) 2 Helpers Note: Downgraded base on pt's progress (she will use stedy to transfer at home)   Problem: RH Ambulation Goal: LTG Patient will ambulate in controlled environment (PT) Description: LTG: Patient will ambulate in a controlled environment, # of feet with assistance (PT). Flowsheets (Taken 12/31/2022 2124) LTG: Pt will ambulate in controlled environ  assist needed:: (downgraded goal to continue working on gait training in therapy but pt will not be a functional ambulator at D/C) Dependent - mechanical lift LTG: Ambulation distance in controlled environment: 62f Note: downgraded goal to continue working on gait training in therapy but pt will not be a functional ambulator at D/C    Problem: RH Wheelchair Mobility Goal: LTG Patient will propel w/c in controlled environment (PT) Description: LTG: Patient will propel wheelchair in controlled environment, # of feet with assist (PT) Flowsheets (Taken 12/31/2022 2129) LTG: Pt will propel w/c in controlled environ  assist needed:: (goal added to give pt increased independence with functional mobility as she will not reach an  ambulator level by D/C) Supervision/Verbal cueing LTG: Propel w/c distance in controlled environment:  165f Note: goal added to give pt increased independence with functional mobility as she will not reach an ambulator level by D/C    Problem: RH Wheelchair Mobility Goal: LTG Patient will propel w/c in home environment (PT) Description: LTG: Patient will propel wheelchair in home environment, # of feet with assistance (PT). Flowsheets (Taken 12/31/2022 2129) LTG: Pt will propel w/c in home environ  assist needed:: (goal added to give pt increased independence with functional mobility as she will not reach an ambulator level by D/C) Supervision/Verbal cueing LTG: Propel w/c distance in home environment: [id number]: goal added to give pt increased independence with functional mobility as she will not reach an ambulator level by D/C

## 2022-12-31 NOTE — Progress Notes (Signed)
Occupational Therapy Session Note  Patient Details  Name: [name] MRN: [id number] Date of Birth: 26-May-1951  Today's Date: 12/31/2022 OT Individual Time: 0930-1030 OT Individual Time Calculation (min): 60 min    Short Term Goals: Week 1:  OT Short Term Goal 1 (Week 1): Pt will complete STS transfer with Mod A + LRAD in preparation for standing ADL tasks. OT Short Term Goal 1 - Progress (Week 1): Met OT Short Term Goal 2 (Week 1): Pt will complete simple bathing activities with consistent Mod A + LRAD. OT Short Term Goal 2 - Progress (Week 1): Met OT Short Term Goal 3 (Week 1): Pt will complete LB dressing with Mod A + LRAD. OT Short Term Goal 3 - Progress (Week 1): Not met Week 2:  OT Short Term Goal 1 (Week 2): Pt will stand without the stedy with max of one person OT Short Term Goal 1 - Progress (Week 2): Met OT Short Term Goal 2 (Week 2): Pt will complete LB bathing tasks at bed level with min A OT Short Term Goal 2 - Progress (Week 2): Met OT Short Term Goal 3 (Week 2): Pt will stand statically for 5 min in prep for more functional ADL transfers OT Short Term Goal 3 - Progress (Week 2): Met  Skilled Therapeutic Interventions/Progress Updates:    1:1 Self care retraining at shower level . Pt received in the power chair. Pt able to control chair to lower leg rest in prep for transition out of chair. Pt doffed socks with min A with reacher. Pt performed sit to stand with supervision with STEDY and transitioned into the shower to wide BSC. Pt able to wash all parts in sitting with setup and long handled sponge. Pt Transitioned out of shower with STEDY again with contact guard. Pt was even able to stand and dry peri area in STEDY today with only one UE support. Pt returned to power chair. Donned shirt and threaded pants with reacher with min A. Pt attempted to perform sit to stand with heavy duty RW with 2nd person steadying the RW. PT needed ~ 8 attempts before able to come forward enough  to be able to come into standing for Ot to perform clothing management. Pt used sock aid to don socks with setup (sock on the aide). Total A for donning shoes and knee brace.  Pt left sitting up in power chair with call bell.   Therapy Documentation Precautions:  Precautions Precautions: Shoulder Type of Shoulder Precautions: R-shoulder pain with ROM/WB Precaution Comments: History of frequent falls at home. Restrictions Weight Bearing Restrictions: No  Pain:     Therapy/Group: Individual Therapy  Willeen Cass [facility] 12/31/2022, 3:54 PM

## 2022-12-31 NOTE — Progress Notes (Signed)
Physical Therapy Weekly Progress Note  Patient Details  Name: [name] MRN: [id number] Date of Birth: November 27, 1950  Beginning of progress report period: December 23, 2022 End of progress report period: December 31, 2022  Today's Date: 12/31/2022   Patient has met 1 of 3 short term goals.  Pt has made slower than anticipated gains towards goals this week. Pt has been consistently working on sit to stand transfers from elevated surface to simulate home environment. Pt is more consistently able to stand with modA however when fatigues or L knee pain is more prevalent pt requires maxA to stand. At times, pt demonstrates a strong posterior bias however has improved over the past week and with multimodal cues and increased time pt can correct. Due to persistent increased anxiety pt has not made gains with ambulation outside of use of Lite Gait however pt is improving in weight shifting, taking lateral steps and was able to perform stand pivot transfer with modA 1 (+2 present for safety). Due to pt being unable to functionally ambulate pt has been trialing PWC with success and is scheduled for w/c eval Fri 2/16. This therapist has been in communication with Earnestine Mealing regarding pt's progress and anticipate husband and daughter to be present Tuesday 2/20 for hands on family education.   Patient continues to demonstrate the following deficits muscle weakness and muscle joint tightness, decreased cardiorespiratoy endurance, impaired timing and sequencing, unbalanced muscle activation, and decreased motor planning, decreased midline orientation, and decreased standing balance, decreased postural control, and decreased balance strategies and therefore will continue to benefit from skilled PT intervention to increase functional independence with mobility.  Patient not progressing toward long term goals.  See goal revision..  Continue plan of care.  PT Short Term Goals Week 3:  PT Short Term Goal 1 (Week 3): Pt  will perform supine<>sit with minA consistently. PT Short Term Goal 1 - Progress (Week 3): Met PT Short Term Goal 2 (Week 3): Pt will performs sit<>stand using LRAD with modA consistently. (from ~20in height) PT Short Term Goal 2 - Progress (Week 3): Met (from ~20in height seat surface) PT Short Term Goal 3 (Week 3): Pt will perform bed<>chair transfer consistently with modA PT Short Term Goal 3 - Progress (Week 3): Progressing toward goal PT Short Term Goal 4 (Week 3): Pt will ambulate at least 10' with modA +1 and LRAD. PT Short Term Goal 4 - Progress (Week 3): Not met Week 4:  PT Short Term Goal 1 (Week 4): = to LTGs based on ELOS  Skilled Therapeutic Interventions/Progress Updates:  Ambulation/gait training;Balance/vestibular training;Discharge planning;Pain management;Skin care/wound management;Therapeutic Activities;UE/LE Coordination activities;UE/LE Strength taining/ROM;Wheelchair propulsion/positioning;Neuromuscular re-education;Functional mobility training;Patient/family education;Cognitive remediation/compensation;Disease management/prevention;Therapeutic Exercise;Splinting/orthotics;Visual/perceptual remediation/compensation;Stair training;Psychosocial support;DME/adaptive equipment instruction;Community reintegration;Functional electrical stimulation   Therapy Documentation Precautions:  Precautions Precautions: Shoulder Type of Shoulder Precautions: R-shoulder pain with ROM/WB Precaution Comments: History of frequent falls at home. Restrictions Weight Bearing Restrictions: No   Therapy/Group: Individual Therapy  Page Spiro, PT, DPT  Rosita DeChalus 12/31/2022, 2:48 PM

## 2022-12-31 NOTE — Progress Notes (Signed)
Physical Therapy Session Note  Patient Details  Name: [name] MRN: [id number] Date of Birth: [birth year]  Today's Date: 12/31/2022 PT Individual Time: [id number] PT Individual Time Calculation (min): 77 min   Short Term Goals: Week 4:  PT Short Term Goal 1 (Week 4): = to LTGs based on ELOS  Skilled Therapeutic Interventions/Progress Updates:    Pt received sitting in power wheelchair (PWC) with her husband present and pt agreeable to therapy session. Pt able to turn on/off PWC and adjust speed to slowest setting for safe navigation out of room and down to main therapy gym with supervision - cuing for visual scanning in both directions to safely navigate around obstacles - pt still learning width of chair and how far she needs to move around objects.  Sit>stand PWC>RW x2 reps with therapist cuing for pt to come to stand on 2nd count rather than waiting for 3 due to pt exerting so much energy to prepare to stand and not being as successful once finally initiating it - pt does place both hands up on RW while sitting, allowed for pt comfort - pt continues to not sustain anterior weight shift long enough while coming to stand, resulting in strong posterior lean requiring heavy mod assist for lifting to stand and balance - pt demos even more delayed knee extension activation today, standing with excessive knee flexion bilaterally. Despite this, pt does appear more confident while standing, with ability to gradually progress to standing with heavy min assist for balance.   Retrieved +2 and performed R stand pivot PWC>EOM using RW with +2 mod assist for balance while turning - initiated the transfer then pt having break-down in motor planning so returned to sitting in [facility] - therapist provided visual demonstration and education on sequencing of the transfer - pt able to complete transfer with improved sequencing of LE stepping and pt actually appearing calm and at one point stating "I'm OK" during the  transfer, which is a significant improvement; however, still requires heavy, skilled assistance to perform safely and successfully.  Therapist reinforced education on need for sit<>stand machine (stedy) at home to allow her to safely transfer with family because pt reports she did not like the slide board transfer option.  Reinforced sit<>stand mechanics for NMR by increasing height of mat table and placing chair in front of pt - had her lean forward and place both hands on chair armrests then focus on anterior trunk weight shift while lifting hips slightly off mat to improve pt comfort with this movement - pt states "I have to lean that far forward?" Continuing to be in disbelief of anterior weight shift requirements, explaining her tendency to have excessive posterior lean while rising.  R stand pivot EOM>PWC using RW with heavy +2 mod assist for maintaining balance during the transfer due to pt having strong posterior lean and gradual worsening of crouched posture with excessive bilateral knee flexion and poor coordination of LE stepping - pt with increased anxiety during this transfer likely due to fatigue but is able to breath and calm herself to allow her to safely complete the transfer.  B LE strengthening and endurance training using Kinetron while sitting in [place] against 50cm/sec resistance for 2 minutes while requiring therapist to manually facilitate proper L LE alignment due to excessive hip external rotation and abduction - cuing for pt to lift R LE while pushing down L LE to decrease the resistance and manage her pain.  PWC mobility back to her room. Therapist  educated pt's husband on plan for [facility] consultation tomorrow with [facility] and reinforced plan for family education next Tuesday. At end of session, pt left seated in PWC in the care of nurse for toileting.   Therapy Documentation Precautions:  Precautions Precautions: Shoulder Type of Shoulder Precautions: R-shoulder pain with  ROM/WB Precaution Comments: History of frequent falls at home. Restrictions Weight Bearing Restrictions: No   Pain:  Continues to have chronic L knee pain that increases with mobility - pt wearing knee brace during session - medication provided by nurse - therapist modifying interventions for pain management.   Therapy/Group: Individual Therapy  Tawana Scale , PT, DPT, NCS, CSRS 12/31/2022, 3:32 PM

## 2023-01-01 LAB — GLUCOSE, CAPILLARY
Glucose-Capillary: 53 mg/dL — ABNORMAL LOW (ref 70–99)
Glucose-Capillary: 90 mg/dL (ref 70–99)
Glucose-Capillary: 260 mg/dL — ABNORMAL HIGH (ref 70–99)
Glucose-Capillary: 298 mg/dL — ABNORMAL HIGH (ref 70–99)
Glucose-Capillary: 262 mg/dL — ABNORMAL HIGH (ref 70–99)

## 2023-01-01 LAB — URINE CULTURE: Culture: >=100000 — AB

## 2023-01-01 MED ORDER — INSULIN GLARGINE-YFGN 100 UNIT/ML ~~LOC~~ SOLN
10.0000 [IU] | Freq: Every day | SUBCUTANEOUS | Status: DC
  Administered 2023-01-02 – 2023-01-05 (×4): 10 [IU] via SUBCUTANEOUS
  Filled 2023-01-01 (×5): qty 0.1

## 2023-01-01 MED ORDER — INSULIN ASPART 100 UNIT/ML IJ SOLN
2.0000 [IU] | Freq: Three times a day (TID) | INTRAMUSCULAR | Status: DC
  Administered 2023-01-01 – 2023-01-06 (×13): 2 [IU] via SUBCUTANEOUS

## 2023-01-01 NOTE — Progress Notes (Signed)
PROGRESS NOTE   Subjective/Complaints: Hpyoglycemic event this AM. She denies any symptoms at the time. Reports no new concerns this AM.  A little anxious about going home next week, but also excited to be home and see her dog.   LBM today  Review of Systems  Constitutional:  Negative for chills and fever.  Eyes:        Baseline blind R eye  Respiratory:  Negative for shortness of breath.   Cardiovascular:  Negative for chest pain.  Gastrointestinal:  Negative for abdominal pain, constipation, heartburn, nausea and vomiting.  Genitourinary:  Negative for dysuria, flank pain, frequency, hematuria and urgency.  Musculoskeletal:  Positive for joint pain.  Skin:  Negative for rash.  Neurological:  Positive for dizziness (transient episodes) and sensory change. Negative for headaches.  Psychiatric/Behavioral:  The patient is nervous/anxious.     Objective:   No results found. No results for input(s): "WBC", "HGB", "HCT", "PLT" in the last 72 hours.  Recent Labs    12/29/22 1800 12/31/22 0807  NA 134* 138  K 4.9 3.6  CL 95* 98  CO2 31 33*  GLUCOSE 266* 100*  BUN 20 17  CREATININE 0.77 0.74  CALCIUM 9.9 9.7      Shoulder Xray 11/30/22 Postsurgical changes are noted in the proximal right humerus with deformity identified. Underlying bony thorax appears within normal limits. No fracture or dislocation is seen. Stable bone island is noted in the mid humerus. No new focal abnormality is seen.   IMPRESSION: Postsurgical  changes without acute abnormality.     Intake/Output Summary (Last 24 hours) at 01/01/2023 0835 Last data filed at 01/01/2023 0715 Gross per 24 hour  Intake 716 ml  Output 100 ml  Net 616 ml         Physical Exam: Vital Signs Blood pressure 124/61, pulse 82, temperature 97.9 F (36.6 C), temperature source Oral, resp. rate 16, height 5' 7"$  (1.702 m), weight 86.3 kg, SpO2 96 %.  Physical  Exam Constitutional: No distress . Vital signs reviewed. Sitting in power WC.  HEENT: NCAT, EOMI, oral membranes moist Neck: supple Cardiovascular: RRR without murmur. No JVD    Respiratory/Chest: CTA Bilaterally without wheezes or rales. Normal effort    GI/Abdomen: BS +, non-tender, non-distended Ext: no clubbing, cyanosis, or edema Psych: pleasant and cooperative  Skin: Trace BLE edema. No breakdown noted Neurologic/MsK:   Sensory exam: revealed reduced sensation to light touch over right plantar foot and heel. Motor exam: Moving all 4 extremities to gravity R shoulder tenderness with palpation and Rom in all directions.  L knee joint line tenderness. Wearing knee brace No abnormal tone noted   Prior Exam      Strength:                RUE: 4/5 SA, 5-/5 EF, 5-/5 EE, 5/5 WE, 5/5 FF, 5/5 FA                 LUE: 5/5 SA, 5/5 EF, 5/5 EE, 5/5 WE, 5/5 FF, 5/5 FA                 RLE: 3+/5 HF, 4+/5 KE, 4/5 DF,  5/5 PF                 LLE:  3-/5 HF, 3+/5 KE, 5/5 DF,  5/5 PF  Coordination: Fine motor coordination was normal.      Assessment/Plan: 1. Functional deficits which require 3+ hours per day of interdisciplinary therapy in a comprehensive inpatient rehab setting. Physiatrist is providing close team supervision and 24 hour management of active medical problems listed below. Physiatrist and rehab team continue to assess barriers to discharge/monitor patient progress toward functional and medical goals  Care Tool:  Bathing    Body parts bathed by patient: Right arm, Chest, Abdomen, Front perineal area, Right upper leg, Left upper leg, Face, Left arm, Buttocks, Left lower leg, Right lower leg   Body parts bathed by helper: Left arm, Buttocks, Right lower leg, Left lower leg     Bathing assist Assist Level: Contact Guard/Touching assist (shower on BSC)     Upper Body Dressing/Undressing Upper body dressing   What is the patient wearing?: Pull over shirt    Upper body assist  Assist Level: Supervision/Verbal cueing    Lower Body Dressing/Undressing Lower body dressing      What is the patient wearing?: Incontinence brief, Pants     Lower body assist Assist for lower body dressing: Moderate Assistance - Patient 50 - 74% (in the stedy)     [place] for toileting: Moderate Assistance - Patient 50 - 74%     Transfers Chair/bed transfer  Transfers assist     Chair/bed transfer assist level: Dependent - mechanical lift     Locomotion Ambulation   Ambulation assist   Ambulation activity did not occur: Safety/medical concerns          Walk 10 feet activity   Assist  Walk 10 feet activity did not occur: Safety/medical concerns        Walk 50 feet activity   Assist Walk 50 feet with 2 turns activity did not occur: Safety/medical concerns         Walk 150 feet activity   Assist Walk 150 feet activity did not occur: Safety/medical concerns         Walk 10 feet on uneven surface  activity   Assist Walk 10 feet on uneven surfaces activity did not occur: Safety/medical concerns         Wheelchair     Assist Is the patient using a wheelchair?: Yes Type of Wheelchair: Manual    Wheelchair assist level: Dependent - Patient 0% Max wheelchair distance: 150    Wheelchair 50 feet with 2 turns activity    Assist        Assist Level: Dependent - Patient 0%   Wheelchair 150 feet activity     Assist      Assist Level: Dependent - Patient 0%   Blood pressure 124/61, pulse 82, temperature 97.9 F (36.6 C), temperature source Oral, resp. rate 16, height 5' 7"$  (1.702 m), weight 86.3 kg, SpO2 96 %.  Medical Problem List and Plan: 1. Functional deficits secondary to debility due to acute on chronic HFpEF, atrial fibrillation with RVR             -patient may shower             -ELOS/Goals: 2/21, min A/mod A for gait at least 10-15 feet  -Continue CIR,  PT/OT  -Power WC eval today    2.  Antithrombotics: -DVT/anticoagulation:  Lovenox 40  mg daily (No full-dose AC d/t Hx SAH 2007)             -antiplatelet therapy: ASA 325 daily per IM until OP cards f/u 3. Pain Management:  Oxycodone or tramadol prn. Kpad ordered -encourage use of Voltaren gel 2g QID for OA right shoulder/left knee.  4. Mood/Behavior/Sleep: LCSW to follow for evaluation and support.              -Melatonin prn--12/05/22 ordered as scheduled 77m QHS, Trazodone PRN   -12/06/22 sleeping improving, continue current regimen -12/12/22 wants melatonin to be changed to PRN, ordered this; advised she will need to request sleep meds if she needs them             -antipsychotic agents: N/A  -Nortriptyline 157mQHS, Neurontin 10038mHS 5. Neuropsych/cognition: This patient is capable of making decisions on her own behalf.  -start xanax 0.25 BID PRN for anxiety -Consulted Psychiatry- psychiatry reviewed her case and case discussed with  TakSheran Favasych recommended continuation current medications encourage therapy and did not feel this issue would benefit from inpatient psych, ok to cancel consult.   -1/31 start low dose duloxetine 68m109miscussed risks and benefits of this medication,may also help with joint pain -2/2 increase duloxetine to 30mg80mly -2/7 increased duloxetine to 60mg 38m2/13 anxiety a little improved per patient, continue duloxetine  6. Skin/Wound Care: Add lotion to BLE and hands             -routine pressure relief measures.  7. Fluids/Electrolytes/Nutrition: Monitor I/O. Intake improving. Monitor weekly BMPs, next 12/28/22 -May need to stay dry to avoid fluid overload. 8. Acute on chronic diastolic CHF: Strict I/O w/daily weights. -Cardiac diet. Monitor for signs of overload.  -continue Lasix 40mg Q57mldactone 12.5mg QD,70mtoprolol 100mg QD,1mvastatin 40mg QD a42mosartan 50mg QD -m51mor for recurrent hyperkalemia (Losartan dose decreased  01/14) -2/1 A little up from a few days ago but appears stable overall -2/16 weights overall stable, continue to monitor  Filed Weights   12/30/22 0340 12/31/22 0521 01/01/23 0450  Weight: 84.5 kg 86.7 kg 86.3 kg     9. A Fib w/RVR: Monitor HR TID--continue Cardizem 180mg QD and37moprolol 100mg QD  10.67m: BPs with some lability, 130s-160s SBP -12/05/22 monitor for now, Losartan decreased 11/29/22, could consider going back to home dosing if appropriate -1/23 BP has been elevated, will increase losartan to 50mg  -2/13 w2mcontrolled overall, decrease losartan dose to 25mg due to K+79mvated -2/16 well controlled  Vitals:   12/28/22 0428 12/28/22 1311 12/28/22 1947 12/29/22 1500  BP: 131/82 126/79 118/62 120/60   12/29/22 2042 12/30/22 0340 12/30/22 1319 12/30/22 2000  BP: 134/66 132/66 121/72 127/73   12/31/22 0521 12/31/22 1333 12/31/22 1956 01/01/23 0450  BP: 133/70 126/71 118/71 124/61     11.  T1DM: Hgb A1c- 7.4. Monitor BS ac/hs and use SSI for elevated BS             -continue insulin glargline 15 units daily.  -May need to add meal coverage-->used 5- 7units tid prn PTA.  -12/05/22 increase Semglee to 16U QD -1/24 add 2 units mealtime insulin -1/25 increase mealtime to 4 units insulin, decrease lantus to 14 units -1/26 decrease mealtime insulin to 3 units due to hypoglycemia, semglee to AM -1/31 trending a little lower, decrease mealtime insulin dose to 2 units -2/1 decrease semglee to 11 units from 14units, recommend nighttime snack -2/2 decreased to moderate resistance  correctional SSI -12/20/22 increase mealtime novolog to 3U again and leave Semglee as is -12/21/22 increase semglee to 13u -2/9 will adjust semglee to 16units, stop mealtime- per pharm recs -2/12 CBG's with a big drop today after being in the 200's-->No recorded intake after breakfast 2/11. -2/13 Decrease semglee to 13 units due to hypoglycemia, I suspect she does not eat consistent carbs with each meal   -2/14 HS insulin and  SSI insulin scale was decreased, will consult DM coordinator -2/15 Novolog 3 units TID restarted per DM coordinator recs, appreciate assistance -2/16 hypoglycemia this AM, decrease lantus to 10units,  decrease mealtime insulin to 2 units for now, glucose control may improve as UTI is treated  CBG (last 3)  Recent Labs    12/31/22 2157 01/01/23 0551 01/01/23 0631  GLUCAP 146* 53* 90     12. Chronic bilateral shoulder and left knee pain: Oxycodone prn -Refusing voltaren gel for local measures-->encourage use             -Aquathermia for local measures.              -L knee: Follows OP ortho, no benefit from injections in the past  -L knee pain improved with knee brace  13. Upper respiratory infection, Nocturnal hypoxia: Question sleep apnea.  -Encourage pulmonary hygiene -Respiratory viral panel negative x2 inpatient -Remains with cough, intermittent SOB - add PRN inhaler, monitor  14.  Pre-renal azotemia: Likely due to diuresis. BUN up but SCr improving.   -Continue to monitor.  -2/15 BUN and Cr stable, recheck monday  15. Overactive bladder, incontinence: hx of, on Myrbetriq 58m QD; now off purewick -12/05/22 having increased frequency and incontinence, ordered U/A, UCx, and scheduled voiding program -12/06/22 U/A c/w UTI, see #16 -Continue myrbetriq,she is mostly continent with occasional incontinent episodes -12/28/22 continues to have improvement, cont regimen  16. UTI 12/06/22:  -U/A c/w UTI, ordered Macrobid 1038mBID x5 days (12/06/22>>12/10/22 last dose), UCx pending- Ecoli sensitive to macrobid, completed course, symptoms resolved -2/7 cloudy urine yesterday, asymptomatic, continue to monitor -2.14 having intermittant burning with urination, check U/A and culture -2/15 keflex started for UTI 7 day, follow urine culture -2/16 E coli 100,000 and E faecalis 80,000 CFU. E coli sensitive to cefazolin, symptoms improved, continue keflex  17.   Hypokalemia  -3048mx2, recheck tomorrow  -K+ up to 3.6, losartan increased today, Recheck tomorrow -1/25 K+ now elevated hyperkalemia at 5.4, lokelma 5g  x1, stop potassium supplementation, discussed with pharmacy, recheck tomorrow -2/12--5.3 no hemolysis reported on bmet. No scheduled potassium today. Give small dose of lokelma today. Also on losartan and aldactone---hold aldactone tomorrow pending repeat bmet. Consider stopping losartan too if bp's are controlled.  -2/13 K+ down to 3.9, decrease losartan dose to 58m66m+ stable 3.6 recheck monday  18. Dizziness -1/22 Will start meclizine 12.5mg 58m, check orthostatic VS -She has HX of vertigo going back to 2013, seen by Dr. Wong [facility], felt to be related to vestibulopathy -1/30 fall yesterday but no injury -2/2 increase meclizine to 58mg 28mprn -12/28/22 --did not report dizziness. Continue meclizine prn  19. Constipation: reported as chronic  -Schedule miralax QD, PRN dulcolax supp and fleet enema -1/26 had vomiting with  sorbitol yesterday, continue miralax daily, she had large BM yesterday -1/29 miralax increased to BID -1/30 she declines stronger medication at this time,order sorbitol PRN -2/1 large bm yesterday, improved  -12/19/22 now having increased stool frequency, wants to change Miralax to BID PRN rather than scheduled;  also wants Colace 150m BID PRN in case she needs it; knows to ask -12/28/22 multiple stools over last 24 hours--on no scheduled bowel meds -2/14 LBM today, liquid, does not appear to be on laxatives scheduled, continue monitor  20. R shoulder pain  -Xray on 1/15 with postsurgical changes, continue voltaren gel, Kpad  -1/31 Duloxetine started as this may help with joint pain also -2/2 Duloxetine increase to 343m-2/7 increased duloxetine to 6086m LOS: 28 days A FACE TO FACE EVALUATION WAS PERFORMED  YurJennye Boroughs16/2024, 8:35 AM

## 2023-01-01 NOTE — Progress Notes (Signed)
Hypoglycemic Event  CBG: 53  Treatment: 8 oz juice/soda  Symptoms: None  Follow-up CBG: [id number] CBG Result:90  Possible Reasons for Event: Unknown  Comments/MD notified: Will make day nurse, Jarrett Soho, RN, aware to notify MD this AM.    Lennox Solders

## 2023-01-01 NOTE — Progress Notes (Signed)
Physical Therapy Session Note  Patient Details  Name: [name] MRN: [id number] Date of Birth: 08-Dec-1950  Today's Date: 01/01/2023 PT Individual Time: 1050-1200 and 1330-1400 PT Individual Time Calculation (min): 70 min and 30 min  Short Term Goals: Week 4:  PT Short Term Goal 1 (Week 4): = to LTGs based on ELOS  Skilled Therapeutic Interventions/Progress Updates: Pt presented in [place] agreeable to therapy. Pt states increased pain in L knee and R shoulder, un-rated. Pt navigated PWC with supervision to main gym. Performed Stedy transfer to high/low mat with CGA. Pt was able use Standard Stedy to transfer to high/low mat with CGA. At 20 in mat pt participated in Sit to stand requiring overall  mod A but throughout session sometimes heavier (near max) sometimes lighter. Pt did require increased time between bouts but was able to complete 6 total stands from mat. With each stand pt was able to increase activity incorporating weight shifting, then marching. Pt was unable to perform lateral steps this session however due to not only increased pain in L knee but also increased dizziness. Pt indicated did not take meclizine this am but agreeable to take now. RN notified and pt received during session. As pt was to perform final stand at mat pt with significant wave of nausea (no emesis). Wave took 5-7 minutes to resolve to point where pt could use Stedy to transfer to [facility]. Pt was able to transfer via Stedy with minA. Once in [place] pt was able to reposition and align properly in chair. Pt then navigated back to room with supervision and left with call bell within reach and needs met.   Tx2: Pt presented in [place] agreeable to therapy. Pt states pain currently controled, un-rated. Pt navigated PWC to main gym with supervision and performed Stedy transfer with minA to high/low mat. Pt indicated dgt had purchased Stedy that was smaller in size than originally though. PTA was able to look up model that dgt purchased  and check measurements (will actually be better fit). Pt set up in preparation for w/c eval. End of session briefly collaborate with OT for w/c eval. Pt left sitting EOM with OT with current needs met.      Therapy Documentation Precautions:  Precautions Precautions: Shoulder Type of Shoulder Precautions: R-shoulder pain with ROM/WB Precaution Comments: History of frequent falls at home. Restrictions Weight Bearing Restrictions: No General:   Vital Signs: Therapy Vitals Temp: 97.8 F (36.6 C) Temp Source: Oral Pulse Rate: 71 Resp: 17 BP: 116/62 Patient Position (if appropriate): Sitting Oxygen Therapy SpO2: 99 % O2 Device: Room Air Pain:   Mobility:   Locomotion :    Trunk/Postural Assessment :    Balance:   Exercises:   Other Treatments:      Therapy/Group: Individual Therapy  Madison Albea 01/01/2023, 4:34 PM

## 2023-01-01 NOTE — Progress Notes (Signed)
Occupational Therapy Session Note  Patient Details  Name: [name] MRN: [id number] Date of Birth: 09/14/1951   Session 1 Today's Date: 01/01/2023 OT Individual Time: [id number] OT Individual Time Calculation (min): 60 min   Session 2 Today's Date: 01/01/2023 OT Individual Time: 1400-1500 OT Individual Time Calculation (min): 60 min    Short Term Goals: Week 4:  OT Short Term Goal 1 (Week 4): STG= LTG d/t ELOS  Skilled Therapeutic Interventions/Progress Updates:    Session 1 Pt received supine with 5/10 pain in her L knee. She was agreeable to OT session. She requested to use the bathroom first. She completed bed mobility with (S) using bed rail. Stedy placed and she was able to stand from EOB slightly elevated with CGA. She was transferred to the [facility] over toilet with the stedy. She was able to consistently stand from the stedy perched seat with close (S). She completed toileting tasks with min A overall. Total A to don new brief while pt stood in the stedy. Pt complaining of an increase in L knee pain today, rest breaks provided. She completed LB dressing with max A d/t time constraints. She navigated her power w/c with extra time and very slow speed, min cueing overall for tight turns. Time spend discussing d/c planning and family private purchase of stedy. Power w/c evaluation is this afternoon. Measured various aspects of the power chair to take into consideration whether stedy that family purchased. The stedy has a 26 in clearance between legs and this will not fit under or around power chair. Will collaborate with w/c vendor on possible configurations of chairs that could make this work. Pt returned to her room and was left sitting up with all needs met.    Session 2 Pt received sitting EOM with PTA present initially to collaborate on power w/c evaluation and features needed to maximize independence with both ADLs and transfers. ATP arrived shortly thereafter- Vania Rea and Merry Proud with  [employer]. Provided input/collaborated on pressure relief features, trunk/pelvis support, and limitations that R shoulder and B knee OA impose. Included need for tilt for pressure relief, seat elevation for improved ease of standing transfers using the stedy, and elevating foot rest. Pt used the stedy and returned to her power w/c with min A. She returned to her room and was left sitting up with all needs met.    Therapy Documentation Precautions:  Precautions Precautions: Shoulder Type of Shoulder Precautions: R-shoulder pain with ROM/WB Precaution Comments: History of frequent falls at home. Restrictions Weight Bearing Restrictions: No  Therapy/Group: Individual Therapy  Curtis Sites 01/01/2023, 7:37 AM

## 2023-01-01 NOTE — Progress Notes (Signed)
Recreational Therapy Discharge Summary Patient Details  Name: Gurtrude Essick MRN: [id number] Date of Birth: 11/10/1951 Today's Date: 01/01/2023  Comments on progress toward goals: Pt is scheduled for discharge home 2/21 with family to provide the needed supervision/assistance.  TR sessions focused on activity analysis/modifications, activity tolerance, stress/anxiety management and use with mobility tasks.  Pt will need continued support for stress/anxiety management at discharge as this is one of the most limiting factors with mobility.  Pt is motivated and looking forward to being at home again.  Pt states she thinks being home with lessen her anxiety as well. Reasons for discharge: discharge from hospital  Follow-up: [place] agrees with progress made and goals achieved: Yes  Margurite Duffy 01/01/2023, 12:29 PM

## 2023-01-01 NOTE — Progress Notes (Signed)
Recreational Therapy Session Note  Patient Details  Name: [name] MRN: [id number] Date of Birth: Oct 30, 1951 Today's Date: 01/01/2023  Pain: no c/o Skilled Therapeutic Interventions/Progress Updates: Pt will discuss strategies that will reduce anxiety with returning to home with min questioning cues.  MET  Session focused on discharge planning, identifying sources of fear/anxiety and possible strategies to reduce feelings of anxiety.  Provided with min questioning cues, pt identified a couple areas of anxiety about upcoming discharge.  Pt then discussed self advocacy , stating concerns and discussing them with caregivers as potential aides in reducing anxiety.  Pt reports great social support from family and friends and identifies ways they could be helpful in stress reduction.   [place] 01/01/2023, 11:18 AM

## 2023-01-02 LAB — GLUCOSE, CAPILLARY
Glucose-Capillary: 74 mg/dL (ref 70–99)
Glucose-Capillary: 214 mg/dL — ABNORMAL HIGH (ref 70–99)
Glucose-Capillary: 283 mg/dL — ABNORMAL HIGH (ref 70–99)
Glucose-Capillary: 287 mg/dL — ABNORMAL HIGH (ref 70–99)

## 2023-01-02 MED ORDER — CEPHALEXIN 250 MG PO CAPS: 250.0000 mg | ORAL_CAPSULE | Freq: Four times a day (QID) | ORAL | Status: DC

## 2023-01-02 MED ORDER — NITROFURANTOIN MONOHYD MACRO 100 MG PO CAPS
100.0000 mg | ORAL_CAPSULE | Freq: Two times a day (BID) | ORAL | Status: DC
  Administered 2023-01-02 – 2023-01-06 (×9): 100 mg via ORAL
  Filled 2023-01-02 (×10): qty 1

## 2023-01-02 NOTE — Progress Notes (Signed)
PROGRESS NOTE   Subjective/Complaints: Doing well today. Slept well, had a BM this morning, pain is doing ok, and she has no other complaints. Wondering about her power w/c delivery being in several weeks but doesn't know what she'll go home with-- will ask weekday team. Excited for upcoming d/c date. Wondering about UTI med-- hasn't gotten abx since yesterday but wasn't sure if she was supposed to be getting them. Last dose Keflex was 01/01/23 AM.   Review of Systems  Constitutional:  Negative for chills and fever.  Eyes:        Baseline blind R eye  Respiratory:  Negative for shortness of breath.   Cardiovascular:  Negative for chest pain.  Gastrointestinal:  Negative for abdominal pain, constipation, heartburn, nausea and vomiting.  Genitourinary:  Positive for dysuria (improving). Negative for flank pain, frequency, hematuria and urgency.  Musculoskeletal:  Positive for joint pain.  Skin:  Negative for rash.  Neurological:  Positive for dizziness (transient episodes) and sensory change. Negative for headaches.  Psychiatric/Behavioral:  The patient is nervous/anxious.     Objective:   No results found. No results for input(s): "WBC", "HGB", "HCT", "PLT" in the last 72 hours.  Recent Labs    12/31/22 0807  NA 138  K 3.6  CL 98  CO2 33*  GLUCOSE 100*  BUN 17  CREATININE 0.74  CALCIUM 9.7    Urinalysis    Component Value Date/Time   COLORURINE YELLOW 12/30/2022 1341   APPEARANCEUR CLOUDY (A) 12/30/2022 1341   LABSPEC 1.012 12/30/2022 1341   PHURINE 5.0 12/30/2022 1341   GLUCOSEU NEGATIVE 12/30/2022 1341   HGBUR MODERATE (A) 12/30/2022 1341   BILIRUBINUR NEGATIVE 12/30/2022 1341   KETONESUR 5 (A) 12/30/2022 1341   PROTEINUR 100 (A) 12/30/2022 1341   NITRITE POSITIVE (A) 12/30/2022 1341   LEUKOCYTESUR LARGE (A) 12/30/2022 1341   UCx 12/31/22: >100k CFU E.coli resistant to Ampicillin and TMP-SMX, 80k CFU  Enterococcus faecalis     Shoulder Xray 11/30/22 Postsurgical changes are noted in the proximal right humerus with deformity identified. Underlying bony thorax appears within normal limits. No fracture or dislocation is seen. Stable bone island is noted in the mid humerus. No new focal abnormality is seen.   IMPRESSION: Postsurgical  changes without acute abnormality.     Intake/Output Summary (Last 24 hours) at 01/02/2023 0711 Last data filed at 01/01/2023 1852 Gross per 24 hour  Intake 716 ml  Output --  Net 716 ml         Physical Exam: Vital Signs Blood pressure 120/66, pulse 81, temperature 97.9 F (36.6 C), temperature source Oral, resp. rate 18, height 5' 7"$  (1.702 m), weight 83.9 kg, SpO2 94 %.  Physical Exam Constitutional: No distress . Vital signs reviewed. Sitting in bed  HEENT: NCAT, EOMI, oral membranes moist Neck: supple Cardiovascular: RRR without murmur. No JVD    Respiratory/Chest: CTA Bilaterally without wheezes or rales. Normal effort    GI/Abdomen: BS +, non-tender, non-distended Ext: no clubbing, no cyanosis Psych: pleasant and cooperative  Skin: Trace BLE edema, chronic lower leg skin changes. No breakdown noted   Prior Exam Neurologic/MsK:   Sensory exam: revealed reduced sensation  to light touch over right plantar foot and heel. Motor exam: Moving all 4 extremities to gravity R shoulder tenderness with palpation and Rom in all directions.  L knee joint line tenderness. Wearing knee brace No abnormal tone noted      Strength:                RUE: 4/5 SA, 5-/5 EF, 5-/5 EE, 5/5 WE, 5/5 FF, 5/5 FA                 LUE: 5/5 SA, 5/5 EF, 5/5 EE, 5/5 WE, 5/5 FF, 5/5 FA                 RLE: 3+/5 HF, 4+/5 KE, 4/5 DF,  5/5 PF                 LLE:  3-/5 HF, 3+/5 KE, 5/5 DF,  5/5 PF  Coordination: Fine motor coordination was normal.      Assessment/Plan: 1. Functional deficits which require 3+ hours per day of interdisciplinary therapy in a  comprehensive inpatient rehab setting. Physiatrist is providing close team supervision and 24 hour management of active medical problems listed below. Physiatrist and rehab team continue to assess barriers to discharge/monitor patient progress toward functional and medical goals  Care Tool:  Bathing    Body parts bathed by patient: Right arm, Chest, Abdomen, Front perineal area, Right upper leg, Left upper leg, Face, Left arm, Buttocks, Left lower leg, Right lower leg   Body parts bathed by helper: Left arm, Buttocks, Right lower leg, Left lower leg     Bathing assist Assist Level: Contact Guard/Touching assist (shower on BSC)     Upper Body Dressing/Undressing Upper body dressing   What is the patient wearing?: Pull over shirt    Upper body assist Assist Level: Supervision/Verbal cueing    Lower Body Dressing/Undressing Lower body dressing      What is the patient wearing?: Incontinence brief, Pants     Lower body assist Assist for lower body dressing: Moderate Assistance - Patient 50 - 74% (in the stedy)     [place] for toileting: Moderate Assistance - Patient 50 - 74%     Transfers Chair/bed transfer  Transfers assist     Chair/bed transfer assist level: Dependent - mechanical lift     Locomotion Ambulation   Ambulation assist   Ambulation activity did not occur: Safety/medical concerns          Walk 10 feet activity   Assist  Walk 10 feet activity did not occur: Safety/medical concerns        Walk 50 feet activity   Assist Walk 50 feet with 2 turns activity did not occur: Safety/medical concerns         Walk 150 feet activity   Assist Walk 150 feet activity did not occur: Safety/medical concerns         Walk 10 feet on uneven surface  activity   Assist Walk 10 feet on uneven surfaces activity did not occur: Safety/medical concerns         Wheelchair     Assist Is the patient  using a wheelchair?: Yes Type of Wheelchair: Manual    Wheelchair assist level: Dependent - Patient 0% Max wheelchair distance: 150    Wheelchair 50 feet with 2 turns activity    Assist        Assist Level: Dependent - Patient 0%   Wheelchair  150 feet activity     Assist      Assist Level: Dependent - Patient 0%   Blood pressure 120/66, pulse 81, temperature 97.9 F (36.6 C), temperature source Oral, resp. rate 18, height 5' 7"$  (1.702 m), weight 83.9 kg, SpO2 94 %.  Medical Problem List and Plan: 1. Functional deficits secondary to debility due to acute on chronic HFpEF, atrial fibrillation with RVR             -patient may shower             -ELOS/Goals: 2/21, min A/mod A for gait at least 10-15 feet  -Continue CIR, PT/OT -Power WC eval today-- wants to know what she will go home with since her w/c won't be delivered for 4-5 weeks-- ask weekday team on Monday    2.  Antithrombotics: -DVT/anticoagulation:  Lovenox 40 mg daily (No full-dose AC d/t Hx SAH 2007)             -antiplatelet therapy: ASA 325 daily per IM until OP cards f/u 3. Pain Management:  Oxycodone or tramadol prn. Kpad ordered -encourage use of Voltaren gel 2g QID for OA right shoulder/left knee.  4. Mood/Behavior/Sleep: LCSW to follow for evaluation and support.              -Melatonin prn--12/05/22 ordered as scheduled 4m QHS, Trazodone PRN   -12/06/22 sleeping improving, continue current regimen -12/12/22 wants melatonin to be changed to PRN, ordered this; advised she will need to request sleep meds if she needs them             -antipsychotic agents: N/A  -Nortriptyline 159mQHS, Neurontin 10028mHS 5. Neuropsych/cognition: This patient is capable of making decisions on her own behalf.  -start xanax 0.25 BID PRN for anxiety -Consulted Psychiatry- psychiatry reviewed her case and case discussed with  TakSheran Favasych recommended continuation current medications encourage therapy and did  not feel this issue would benefit from inpatient psych, ok to cancel consult.   -1/31 start low dose duloxetine 2m56miscussed risks and benefits of this medication,may also help with joint pain -2/2 increase duloxetine to 30mg103mly -2/7 increased duloxetine to 60mg 104m2/13 anxiety a little improved per patient, continue duloxetine  6. Skin/Wound Care: Add lotion to BLE and hands             -routine pressure relief measures.  7. Fluids/Electrolytes/Nutrition: Monitor I/O. Intake improving. Monitor weekly BMPs, next 01/04/23 -May need to stay dry to avoid fluid overload. 8. Acute on chronic diastolic CHF: Strict I/O w/daily weights. -Cardiac diet. Monitor for signs of overload.  -continue Lasix 40mg Q14mldactone 12.5mg QD,68mtoprolol 100mg QD,56mvastatin 40mg QD a75mosartan 50mg QD -m19mor for recurrent hyperkalemia (Losartan dose decreased 01/14) -2/1 A little up from a few days ago but appears stable overall -01/02/23 weights overall stable, continue to monitor  Filed Weights   12/31/22 0521 01/01/23 0450 01/02/23 0509  Weight: 86.7 kg 86.3 kg 83.9 kg     9. A Fib w/RVR: Monitor HR TID--continue Cardizem 180mg QD and62moprolol 100mg QD  10.62m: BPs with some lability, 130s-160s SBP -12/05/22 monitor for now, Losartan decreased 11/29/22, could consider going back to home dosing if appropriate -1/23 BP has been elevated, will increase losartan to 50mg  -2/13 w26mcontrolled overall, decrease losartan dose to 25mg due to K+58mvated -01/02/23 well controlled, cont regimen  Vitals:   12/29/22 1500 12/29/22 2042 12/30/22 0340 12/30/22 1319  BP:  120/60 134/66 132/66 121/72   12/30/22 2000 12/31/22 0521 12/31/22 1333 12/31/22 1956  BP: 127/73 133/70 126/71 118/71   01/01/23 0450 01/01/23 1337 01/01/23 1937 01/02/23 0239  BP: 124/61 116/62 126/79 120/66     11.  T1DM: Hgb A1c- 7.4. Monitor BS ac/hs and use SSI for elevated BS             -continue insulin glargline 15 units  daily.  -May need to add meal coverage-->used 5- 7units tid prn PTA.  -12/05/22 increase Semglee to 16U QD -1/24 add 2 units mealtime insulin -1/25 increase mealtime to 4 units insulin, decrease lantus to 14 units -1/26 decrease mealtime insulin to 3 units due to hypoglycemia, semglee to AM -1/31 trending a little lower, decrease mealtime insulin dose to 2 units -2/1 decrease semglee to 11 units from 14units, recommend nighttime snack -2/2 decreased to moderate resistance correctional SSI -12/20/22 increase mealtime novolog to 3U again and leave Semglee as is -12/21/22 increase semglee to 13u -2/9 will adjust semglee to 16units, stop mealtime- per pharm recs -2/12 CBG's with a big drop today after being in the 200's-->No recorded intake after breakfast 2/11. -2/13 Decrease semglee to 13 units due to hypoglycemia, I suspect she does not eat consistent carbs with each meal  -2/14 HS insulin and  SSI insulin scale was decreased, will consult DM coordinator -2/15 Novolog 3 units TID restarted per DM coordinator recs, appreciate assistance -2/16 hypoglycemia this AM, decrease lantus to 10units,  decrease mealtime insulin to 2 units for now, glucose control may improve as UTI is treated -01/02/23 CBGs similar in pattern, cont regimen for now, monitor for changes  CBG (last 3)  Recent Labs    01/01/23 1646 01/01/23 2107 01/02/23 0609  GLUCAP 298* 262* 74      12. Chronic bilateral shoulder and left knee pain: Oxycodone prn -Refusing voltaren gel for local measures-->encourage use             -Aquathermia for local measures.              -L knee: Follows OP ortho, no benefit from injections in the past  -L knee pain improved with knee brace  13. Upper respiratory infection, Nocturnal hypoxia: Question sleep apnea.  -Encourage pulmonary hygiene -Respiratory viral panel negative x2 inpatient -Remains with cough, intermittent SOB - add PRN inhaler, monitor  14.  Pre-renal azotemia: Likely due  to diuresis. BUN up but SCr improving.   -Continue to monitor.  -2/15 BUN and Cr stable, recheck Monday 01/04/23  15. Overactive bladder, incontinence: hx of, on Myrbetriq 72m QD; now off purewick -12/05/22 having increased frequency and incontinence, ordered U/A, UCx, and scheduled voiding program -12/06/22 U/A c/w UTI, see #16 -Continue myrbetriq,she is mostly continent with occasional incontinent episodes -12/28/22 continues to have improvement, cont regimen  16. UTI 12/06/22:  -U/A c/w UTI, ordered Macrobid 107mBID x5 days (12/06/22>>12/10/22 last dose), UCx pending- Ecoli sensitive to macrobid, completed course, symptoms resolved -2/7 cloudy urine yesterday, asymptomatic, continue to monitor -2.14 having intermittant burning with urination, check U/A and culture -2/15 keflex started for UTI 7 day, follow urine culture -2/16 E coli 100,000 and E faecalis 80,000 CFU. E coli sensitive to cefazolin, symptoms improved, continue keflex -01/02/23 Keflex fell off after 7 doses (last dose 01/01/23 AM, total of 2 full days worth of Keflex given)-- reordered, but Pharmacy states Keflex won't cover E.faecalis so switched to Macrobid 10015mID x5 days (end 01/06/23 PM)  17.  Hypokalemia  -  46mq x2, recheck tomorrow  -K+ up to 3.6, losartan increased today, Recheck tomorrow -1/25 K+ now elevated hyperkalemia at 5.4, lokelma 5g  x1, stop potassium supplementation, discussed with pharmacy, recheck tomorrow -2/12--5.3 no hemolysis reported on bmet. No scheduled potassium today. Give small dose of lokelma today. Also on losartan and aldactone---hold aldactone tomorrow pending repeat bmet. Consider stopping losartan too if bp's are controlled.  -2/13 K+ down to 3.9, decrease losartan dose to 2262m-12/31/22 K+ stable 3.6 recheck Monday 01/04/23  18. Dizziness -1/22 Will start meclizine 12.62m51mRN, check orthostatic VS -She has HX of vertigo going back to 2013, seen by Dr. WonJacelyn [facility], felt to be related to  vestibulopathy -1/30 fall yesterday but no injury -2/2 increase meclizine to 262m30mD prn -12/28/22 --did not report dizziness. Continue meclizine prn -01/02/23 meclizine helping mostly, not causing excessive drowsiness, continue regimen  19. Constipation: reported as chronic  -Schedule miralax QD, PRN dulcolax supp and fleet enema -1/26 had vomiting with  sorbitol yesterday, continue miralax daily, she had large BM yesterday -1/29 miralax increased to BID -1/30 she declines stronger medication at this time,order sorbitol PRN -2/1 large bm yesterday, improved  -12/19/22 now having increased stool frequency, wants to change Miralax to BID PRN rather than scheduled; also wants Colace 100mg69m PRN in case she needs it; knows to ask -12/28/22 multiple stools over last 24 hours--on no scheduled bowel meds -2/14 LBM today, liquid, does not appear to be on laxatives scheduled, continue monitor -01/02/23 BMs improving, cont to monitor  20. R shoulder pain  -Xray on 1/15 with postsurgical changes, continue voltaren gel, Kpad  -1/31 Duloxetine started as this may help with joint pain also -2/2 Duloxetine increase to 30mg 38m increased duloxetine to 60mg  48m: 29 days A FACE TO FACE [id number], 7:11 AM

## 2023-01-03 DIAGNOSIS — R197 Diarrhea, unspecified: Secondary | ICD-10-CM

## 2023-01-03 LAB — GLUCOSE, CAPILLARY
Glucose-Capillary: 137 mg/dL — ABNORMAL HIGH (ref 70–99)
Glucose-Capillary: 144 mg/dL — ABNORMAL HIGH (ref 70–99)
Glucose-Capillary: 281 mg/dL — ABNORMAL HIGH (ref 70–99)
Glucose-Capillary: 292 mg/dL — ABNORMAL HIGH (ref 70–99)

## 2023-01-03 MED ORDER — LOPERAMIDE HCL 2 MG PO CAPS: 2.0000 mg | ORAL_CAPSULE | Freq: Four times a day (QID) | ORAL | Status: DC | PRN

## 2023-01-03 MED ORDER — SACCHAROMYCES BOULARDII 250 MG PO CAPS
250.0000 mg | ORAL_CAPSULE | Freq: Two times a day (BID) | ORAL | Status: DC
  Administered 2023-01-03 – 2023-01-06 (×6): 250 mg via ORAL
  Filled 2023-01-03 (×6): qty 1

## 2023-01-03 NOTE — Progress Notes (Signed)
PROGRESS NOTE   Subjective/Complaints: Had a rough night last night, didn't sleep well because her "stomach was churning" and she had multiple episodes of diarrhea. Thinks it's the chicken pot pie she had last night. Seems to have slowed down/stopped now. Feeling a bit better this morning.  Pain well controlled. Dysuria better.  Denies any other complaints or concerns.   Review of Systems  Constitutional:  Negative for chills and fever.  Eyes:        Baseline blind R eye  Respiratory:  Negative for shortness of breath.   Cardiovascular:  Negative for chest pain.  Gastrointestinal:  Positive for diarrhea. Negative for abdominal pain, constipation, heartburn, nausea and vomiting.  Genitourinary:  Positive for dysuria (improving). Negative for flank pain, frequency, hematuria and urgency.  Musculoskeletal:  Positive for joint pain.  Skin:  Negative for rash.  Neurological:  Positive for dizziness (transient episodes) and sensory change. Negative for headaches.  Psychiatric/Behavioral:  The patient is nervous/anxious.     Objective:   No results found. No results for input(s): "WBC", "HGB", "HCT", "PLT" in the last 72 hours.  Recent Labs    12/31/22 0807  NA 138  K 3.6  CL 98  CO2 33*  GLUCOSE 100*  BUN 17  CREATININE 0.74  CALCIUM 9.7    Urinalysis    Component Value Date/Time   COLORURINE YELLOW 12/30/2022 1341   APPEARANCEUR CLOUDY (A) 12/30/2022 1341   LABSPEC 1.012 12/30/2022 1341   PHURINE 5.0 12/30/2022 1341   GLUCOSEU NEGATIVE 12/30/2022 1341   HGBUR MODERATE (A) 12/30/2022 1341   BILIRUBINUR NEGATIVE 12/30/2022 1341   KETONESUR 5 (A) 12/30/2022 1341   PROTEINUR 100 (A) 12/30/2022 1341   NITRITE POSITIVE (A) 12/30/2022 1341   LEUKOCYTESUR LARGE (A) 12/30/2022 1341   UCx 12/31/22: >100k CFU E.coli resistant to Ampicillin and TMP-SMX, 80k CFU Enterococcus faecalis     Shoulder Xray  11/30/22 Postsurgical changes are noted in the proximal right humerus with deformity identified. Underlying bony thorax appears within normal limits. No fracture or dislocation is seen. Stable bone island is noted in the mid humerus. No new focal abnormality is seen.   IMPRESSION: Postsurgical  changes without acute abnormality.     Intake/Output Summary (Last 24 hours) at 01/03/2023 0704 Last data filed at 01/02/2023 1833 Gross per 24 hour  Intake 480 ml  Output --  Net 480 ml         Physical Exam: Vital Signs Blood pressure 127/68, pulse 81, temperature 98.3 F (36.8 C), temperature source Oral, resp. rate 18, height 5' 7"$  (1.702 m), weight 84.7 kg, SpO2 94 %.  Physical Exam Constitutional: No distress . Vital signs reviewed. Laying in bed  HEENT: NCAT, EOMI, oral membranes moist Neck: supple Cardiovascular: IIR, normal rate, without murmur. No JVD    Respiratory/Chest: CTA Bilaterally without wheezes or rales. Normal effort    GI/Abdomen: BS +, non-tender, non-distended Ext: no clubbing, no cyanosis Psych: pleasant and cooperative  Skin: Trace BLE edema, chronic lower leg skin changes. No breakdown noted   Prior Exam Neurologic/MsK:   Sensory exam: revealed reduced sensation to light touch over right plantar foot and heel. Motor exam: Moving  all 4 extremities to gravity R shoulder tenderness with palpation and Rom in all directions.  L knee joint line tenderness. Wearing knee brace No abnormal tone noted      Strength:                RUE: 4/5 SA, 5-/5 EF, 5-/5 EE, 5/5 WE, 5/5 FF, 5/5 FA                 LUE: 5/5 SA, 5/5 EF, 5/5 EE, 5/5 WE, 5/5 FF, 5/5 FA                 RLE: 3+/5 HF, 4+/5 KE, 4/5 DF,  5/5 PF                 LLE:  3-/5 HF, 3+/5 KE, 5/5 DF,  5/5 PF  Coordination: Fine motor coordination was normal.      Assessment/Plan: 1. Functional deficits which require 3+ hours per day of interdisciplinary therapy in a comprehensive inpatient rehab  setting. Physiatrist is providing close team supervision and 24 hour management of active medical problems listed below. Physiatrist and rehab team continue to assess barriers to discharge/monitor patient progress toward functional and medical goals  Care Tool:  Bathing    Body parts bathed by patient: Right arm, Chest, Abdomen, Front perineal area, Right upper leg, Left upper leg, Face, Left arm, Buttocks, Left lower leg, Right lower leg   Body parts bathed by helper: Left arm, Buttocks, Right lower leg, Left lower leg     Bathing assist Assist Level: Contact Guard/Touching assist (shower on BSC)     Upper Body Dressing/Undressing Upper body dressing   What is the patient wearing?: Pull over shirt    Upper body assist Assist Level: Supervision/Verbal cueing    Lower Body Dressing/Undressing Lower body dressing      What is the patient wearing?: Incontinence brief, Pants     Lower body assist Assist for lower body dressing: Moderate Assistance - Patient 50 - 74% (in the stedy)     [place] for toileting: Moderate Assistance - Patient 50 - 74%     Transfers Chair/bed transfer  Transfers assist     Chair/bed transfer assist level: Dependent - mechanical lift     Locomotion Ambulation   Ambulation assist   Ambulation activity did not occur: Safety/medical concerns          Walk 10 feet activity   Assist  Walk 10 feet activity did not occur: Safety/medical concerns        Walk 50 feet activity   Assist Walk 50 feet with 2 turns activity did not occur: Safety/medical concerns         Walk 150 feet activity   Assist Walk 150 feet activity did not occur: Safety/medical concerns         Walk 10 feet on uneven surface  activity   Assist Walk 10 feet on uneven surfaces activity did not occur: Safety/medical concerns         Wheelchair     Assist Is the patient using a wheelchair?:  Yes Type of Wheelchair: Manual    Wheelchair assist level: Dependent - Patient 0% Max wheelchair distance: 150    Wheelchair 50 feet with 2 turns activity    Assist        Assist Level: Dependent - Patient 0%   Wheelchair 150 feet activity     Assist  Assist Level: Dependent - Patient 0%   Blood pressure 127/68, pulse 81, temperature 98.3 F (36.8 C), temperature source Oral, resp. rate 18, height 5' 7"$  (1.702 m), weight 84.7 kg, SpO2 94 %.  Medical Problem List and Plan: 1. Functional deficits secondary to debility due to acute on chronic HFpEF, atrial fibrillation with RVR             -patient may shower             -ELOS/Goals: 2/21, min A/mod A for gait at least 10-15 feet  -Continue CIR, PT/OT -Power WC eval today-- wants to know what she will go home with since her w/c won't be delivered for 4-5 weeks-- ask weekday team on Monday    2.  Antithrombotics: -DVT/anticoagulation:  Lovenox 40 mg daily (No full-dose AC d/t Hx SAH 2007)             -antiplatelet therapy: ASA 325 daily per IM until OP cards f/u 3. Pain Management:  Oxycodone or tramadol prn. Kpad ordered -encourage use of Voltaren gel 2g QID for OA right shoulder/left knee.  4. Mood/Behavior/Sleep: LCSW to follow for evaluation and support.              -Melatonin prn--12/05/22 ordered as scheduled 14m QHS, Trazodone PRN   -12/06/22 sleeping improving, continue current regimen -12/12/22 wants melatonin to be changed to PRN, ordered this; advised she will need to request sleep meds if she needs them             -antipsychotic agents: N/A  -Nortriptyline 172mQHS, Neurontin 10071mHS 5. Neuropsych/cognition: This patient is capable of making decisions on her own behalf.  -start xanax 0.25 BID PRN for anxiety -Consulted Psychiatry- psychiatry reviewed her case and case discussed with  TakSheran Favasych recommended continuation current medications encourage therapy and did not feel this issue  would benefit from inpatient psych, ok to cancel consult.   -1/31 start low dose duloxetine 48m70miscussed risks and benefits of this medication,may also help with joint pain -2/2 increase duloxetine to 30mg70mly -2/7 increased duloxetine to 60mg 48m2/13 anxiety a little improved per patient, continue duloxetine  6. Skin/Wound Care: Add lotion to BLE and hands             -routine pressure relief measures.  7. Fluids/Electrolytes/Nutrition: Monitor I/O. Intake improving. Monitor weekly BMPs, next 01/04/23 -May need to stay dry to avoid fluid overload. 8. Acute on chronic diastolic CHF: Strict I/O w/daily weights. -Cardiac diet. Monitor for signs of overload.  -continue Lasix 40mg Q83mldactone 12.5mg QD,83mtoprolol 100mg QD,55mvastatin 40mg QD a17mosartan 50mg QD -m56mor for recurrent hyperkalemia (Losartan dose decreased 01/14) -2/1 A little up from a few days ago but appears stable overall -01/03/23 weights stable, continue to monitor  Filed Weights   01/01/23 0450 01/02/23 0509 01/03/23 0454  Weight: 86.3 kg 83.9 kg 84.7 kg     9. A Fib w/RVR: Monitor HR TID--continue Cardizem 180mg QD and86moprolol 100mg QD  10.66m: BPs with some lability, 130s-160s SBP -12/05/22 monitor for now, Losartan decreased 11/29/22, could consider going back to home dosing if appropriate -1/23 BP has been elevated, will increase losartan to 50mg  -2/13 w27mcontrolled overall, decrease losartan dose to 25mg due to K+38mvated -01/03/23 well controlled, cont regimen  Vitals:   12/30/22 2000 12/31/22 0521 12/31/22 1333 12/31/22 1956  BP: 127/73 133/70 126/71 118/71   01/01/23 0450 01/01/23 1337 01/01/23 1937 01/02/23 0239  BP: 124/61 116/62 126/79 120/66   01/02/23 1329 01/02/23 1409 01/02/23 1940 01/03/23 0257  BP: (!) 133/57 124/60 (!) 133/56 127/68     11.  T1DM: Hgb A1c- 7.4. Monitor BS ac/hs and use SSI for elevated BS             -continue insulin glargline 15 units daily.  -May need to add  meal coverage-->used 5- 7units tid prn PTA.  -12/05/22 increase Semglee to 16U QD -1/24 add 2 units mealtime insulin -1/25 increase mealtime to 4 units insulin, decrease lantus to 14 units -1/26 decrease mealtime insulin to 3 units due to hypoglycemia, semglee to AM -1/31 trending a little lower, decrease mealtime insulin dose to 2 units -2/1 decrease semglee to 11 units from 14units, recommend nighttime snack -2/2 decreased to moderate resistance correctional SSI -12/20/22 increase mealtime novolog to 3U again and leave Semglee as is -12/21/22 increase semglee to 13u -2/9 will adjust semglee to 16units, stop mealtime- per pharm recs -2/12 CBG's with a big drop today after being in the 200's-->No recorded intake after breakfast 2/11. -2/13 Decrease semglee to 13 units due to hypoglycemia, I suspect she does not eat consistent carbs with each meal  -2/14 HS insulin and  SSI insulin scale was decreased, will consult DM coordinator -2/15 Novolog 3 units TID restarted per DM coordinator recs, appreciate assistance -2/16 hypoglycemia this AM, decrease lantus to 10units,  decrease mealtime insulin to 2 units for now, glucose control may improve as UTI is treated -01/02/23 CBGs similar in pattern, cont regimen for now, monitor for changes -01/03/23 CBGs overall uptrending but may be due to UTI-- monitor  CBG (last 3)  Recent Labs    01/02/23 1651 01/02/23 2115 01/03/23 0613  GLUCAP 283* 287* 137*      12. Chronic bilateral shoulder and left knee pain: Oxycodone prn -Refusing voltaren gel for local measures-->encourage use             -Aquathermia for local measures.              -L knee: Follows OP ortho, no benefit from injections in the past  -L knee pain improved with knee brace  13. Upper respiratory infection, Nocturnal hypoxia: Question sleep apnea.  -Encourage pulmonary hygiene -Respiratory viral panel negative x2 inpatient -Remains with cough, intermittent SOB - add PRN inhaler,  monitor  14.  Pre-renal azotemia: Likely due to diuresis. BUN up but SCr improving.   -Continue to monitor.  -2/15 BUN and Cr stable, recheck Monday 01/04/23  15. Overactive bladder, incontinence: hx of, on Myrbetriq 72m QD; now off purewick -12/05/22 having increased frequency and incontinence, ordered U/A, UCx, and scheduled voiding program -12/06/22 U/A c/w UTI, see #16 -Continue myrbetriq,she is mostly continent with occasional incontinent episodes -12/28/22 continues to have improvement, cont regimen  16. UTI 12/06/22:  -U/A c/w UTI, ordered Macrobid 1045mBID x5 days (12/06/22>>12/10/22 last dose), UCx pending- Ecoli sensitive to macrobid, completed course, symptoms resolved -2/7 cloudy urine yesterday, asymptomatic, continue to monitor -2.14 having intermittant burning with urination, check U/A and culture -2/15 keflex started for UTI 7 day, follow urine culture -2/16 E coli 100,000 and E faecalis 80,000 CFU. E coli sensitive to cefazolin, symptoms improved, continue keflex -01/02/23 Keflex fell off after 7 doses (last dose 01/01/23 AM, total of 2 full days worth of Keflex given)-- reordered, but Pharmacy states Keflex won't cover E.faecalis so switched to Macrobid 10082mID x5 days (end 01/06/23 PM)  17.  Hypokalemia  -82m35m2,  recheck tomorrow  -K+ up to 3.6, losartan increased today, Recheck tomorrow -1/25 K+ now elevated hyperkalemia at 5.4, lokelma 5g  x1, stop potassium supplementation, discussed with pharmacy, recheck tomorrow -2/12--5.3 no hemolysis reported on bmet. No scheduled potassium today. Give small dose of lokelma today. Also on losartan and aldactone---hold aldactone tomorrow pending repeat bmet. Consider stopping losartan too if bp's are controlled.  -2/13 K+ down to 3.9, decrease losartan dose to 85m -12/31/22 K+ stable 3.6 recheck Monday 01/04/23  18. Dizziness -1/22 Will start meclizine 12.545mPRN, check orthostatic VS -She has HX of vertigo going back to 2013, seen  by Dr. WoJacelyn [facility]rology, felt to be related to vestibulopathy -1/30 fall yesterday but no injury -2/2 increase meclizine to 2570mID prn -12/28/22 --did not report dizziness. Continue meclizine prn -01/02/23 meclizine helping mostly, not causing excessive drowsiness, continue regimen  19. Constipation/Diarrhea: has chronic constipation, now with diarrhea 01/03/23  -Schedule miralax QD, PRN dulcolax supp and fleet enema -1/26 had vomiting with  sorbitol yesterday, continue miralax daily, she had large BM yesterday -1/29 miralax increased to BID -1/30 she declines stronger medication at this time,order sorbitol PRN -2/1 large bm yesterday, improved  -12/19/22 now having increased stool frequency, wants to change Miralax to BID PRN rather than scheduled; also wants Colace 100m58mD PRN in case she needs it; knows to ask -12/28/22 multiple stools over last 24 hours--on no scheduled bowel meds -2/14 LBM today, liquid, does not appear to be on laxatives scheduled, continue monitor -01/02/23 BMs improving, cont to monitor -01/03/23 multiple loose BMs yesterday, ?food related vs abx-- slowing down this morning. Ordered imodium 2mg 39m PRN, monitor closely  20. R shoulder pain  -Xray on 1/15 with postsurgical changes, continue voltaren gel, Kpad  -1/31 Duloxetine started as this may help with joint pain also -2/2 Duloxetine increase to 30mg 30m increased duloxetine to 60mg  49m: 30 days A FACE TO FACE [id number], 7:04 AM

## 2023-01-04 DIAGNOSIS — F411 Generalized anxiety disorder: Secondary | ICD-10-CM | POA: Insufficient documentation

## 2023-01-04 DIAGNOSIS — N390 Urinary tract infection, site not specified: Secondary | ICD-10-CM | POA: Insufficient documentation

## 2023-01-04 LAB — CBC
HCT: 45.3 % (ref 36.0–46.0)
Hemoglobin: 14.6 g/dL (ref 12.0–15.0)
MCH: 27.1 pg (ref 26.0–34.0)
MCHC: 32.2 g/dL (ref 30.0–36.0)
MCV: 84.2 fL (ref 80.0–100.0)
Platelets: 168 10*3/uL (ref 150–400)
RBC: 5.38 MIL/uL — ABNORMAL HIGH (ref 3.87–5.11)
RDW: 17.7 % — ABNORMAL HIGH (ref 11.5–15.5)
WBC: 4.9 10*3/uL (ref 4.0–10.5)
nRBC: 0 % (ref 0.0–0.2)

## 2023-01-04 LAB — BASIC METABOLIC PANEL
Anion gap: 12 (ref 5–15)
BUN: 9 mg/dL (ref 8–23)
CO2: 28 mmol/L (ref 22–32)
Calcium: 9.8 mg/dL (ref 8.9–10.3)
Chloride: 97 mmol/L — ABNORMAL LOW (ref 98–111)
Creatinine, Ser: 0.64 mg/dL (ref 0.44–1.00)
GFR, Estimated: >60 mL/min (ref >60)
Glucose, Bld: 91 mg/dL (ref 70–99)
Potassium: 3.5 mmol/L (ref 3.5–5.1)
Sodium: 137 mmol/L (ref 135–145)

## 2023-01-04 LAB — GLUCOSE, CAPILLARY
Glucose-Capillary: 97 mg/dL (ref 70–99)
Glucose-Capillary: 191 mg/dL — ABNORMAL HIGH (ref 70–99)
Glucose-Capillary: 265 mg/dL — ABNORMAL HIGH (ref 70–99)
Glucose-Capillary: 230 mg/dL — ABNORMAL HIGH (ref 70–99)

## 2023-01-04 MED ORDER — DIPHENOXYLATE-ATROPINE 2.5-0.025 MG PO TABS: 1.0000 | ORAL_TABLET | Freq: Two times a day (BID) | ORAL | Status: DC | PRN

## 2023-01-04 NOTE — Discharge Instructions (Signed)
Inpatient Rehab Discharge Instructions  [name] Discharge date and time:  01/05/23  Activities/Precautions/ Functional Status: Activity: no lifting, driving, or strenuous exercise  Diet: cardiac diet and diabetic diet Wound Care: none needed   Functional status:  ___ No restrictions     ___ Walk up steps independently _X__ 24/7 supervision/assistance   ___ Walk up steps with assistance ___ Intermittent supervision/assistance  ___ Bathe/dress independently ___ Walk with walker     _X__ Bathe/dress with assistance ___ Walk Independently    ___ Shower independently ___ Walk with assistance    ___ Shower with assistance _X__ No alcohol     ___ Return to work/school ________   Special Instructions: Check blood sugars before meals and at bedtime--take readings with you to PCP>     COMMUNITY REFERRALS UPON DISCHARGE:    Home Health:   [place] Phone: [phone]    Medical Equipment/Items Ordered:POWER CHAIR HAS OTHER EQUIPMENT FROM PREVIOUS ADMISSIONS                                                 Agency/Supplier:[employer]  [phone]  PURCHASED STEDY ON OWN AND HAS AT HOME    My questions have been answered and I understand these instructions. I will adhere to these goals and the provided educational materials after my discharge from the hospital.  Patient/Caregiver Signature _______________________________ Date __________  Clinician Signature _______________________________________ Date __________  Please bring this form and your medication list with you to all your follow-up doctor's appointments.

## 2023-01-04 NOTE — Progress Notes (Signed)
PROGRESS NOTE   Subjective/Complaints: Diarrhea has improved today. She asks about timing of power WC. No additional concerns this AM.   Review of Systems  Constitutional:  Negative for chills and fever.  Eyes:        Baseline blind R eye  Respiratory:  Negative for shortness of breath.   Cardiovascular:  Negative for chest pain.  Gastrointestinal:  Positive for diarrhea. Negative for abdominal pain, constipation, heartburn, nausea and vomiting.  Genitourinary:  Positive for dysuria (improving). Negative for flank pain, frequency, hematuria and urgency.  Musculoskeletal:  Positive for joint pain.  Skin:  Negative for rash.  Neurological:  Positive for dizziness (transient episodes) and sensory change. Negative for headaches.  Psychiatric/Behavioral:  The patient is nervous/anxious.     Objective:   No results found. Recent Labs    01/04/23 0532  WBC 4.9  HGB 14.6  HCT 45.3  PLT 168    Recent Labs    01/04/23 0532  NA 137  K 3.5  CL 97*  CO2 28  GLUCOSE 91  BUN 9  CREATININE 0.64  CALCIUM 9.8    Urinalysis    Component Value Date/Time   COLORURINE YELLOW 12/30/2022 1341   APPEARANCEUR CLOUDY (A) 12/30/2022 1341   LABSPEC 1.012 12/30/2022 1341   PHURINE 5.0 12/30/2022 1341   GLUCOSEU NEGATIVE 12/30/2022 1341   HGBUR MODERATE (A) 12/30/2022 1341   BILIRUBINUR NEGATIVE 12/30/2022 1341   KETONESUR 5 (A) 12/30/2022 1341   PROTEINUR 100 (A) 12/30/2022 1341   NITRITE POSITIVE (A) 12/30/2022 1341   LEUKOCYTESUR LARGE (A) 12/30/2022 1341   UCx 12/31/22: >100k CFU E.coli resistant to Ampicillin and TMP-SMX, 80k CFU Enterococcus faecalis     Shoulder Xray 11/30/22 Postsurgical changes are noted in the proximal right humerus with deformity identified. Underlying bony thorax appears within normal limits. No fracture or dislocation is seen. Stable bone island is noted in the mid humerus. No new focal  abnormality is seen.   IMPRESSION: Postsurgical  changes without acute abnormality.     Intake/Output Summary (Last 24 hours) at 01/04/2023 0826 Last data filed at 01/03/2023 1800 Gross per 24 hour  Intake 720 ml  Output --  Net 720 ml         Physical Exam: Vital Signs Blood pressure 133/67, pulse 85, temperature 98.2 F (36.8 C), temperature source Oral, resp. rate 18, height 5' 7"$  (1.702 m), weight 84.7 kg, SpO2 95 %.  Physical Exam Constitutional: No distress . Vital signs reviewed. Sitting in chair- appears comfortable HEENT: NCAT, conjugate gaze, oral membranes moist Neck: supple Cardiovascular: IIR, normal rate, without murmur. No JVD    Respiratory/Chest: CTA Bilaterally without wheezes or rales. Normal effort    GI/Abdomen: BS +, non-tender, non-distended Ext: no clubbing, no cyanosis Psych: pleasant and cooperative  Skin: Trace BLE edema, chronic lower leg skin changes. No breakdown noted   Prior Exam Neurologic/MsK:   Sensory exam: revealed reduced sensation to light touch over right plantar foot and heel. Motor exam: Moving all 4 extremities to gravity R shoulder tenderness with palpation and Rom in all directions.  L knee joint line tenderness. Wearing knee brace No abnormal tone noted  Strength:                RUE: 4/5 SA, 5-/5 EF, 5-/5 EE, 5/5 WE, 5/5 FF, 5/5 FA                 LUE: 5/5 SA, 5/5 EF, 5/5 EE, 5/5 WE, 5/5 FF, 5/5 FA                 RLE: 3+/5 HF, 4+/5 KE, 4/5 DF,  5/5 PF                 LLE:  3-/5 HF, 3+/5 KE, 5/5 DF,  5/5 PF  Coordination: Fine motor coordination was normal.      Assessment/Plan: 1. Functional deficits which require 3+ hours per day of interdisciplinary therapy in a comprehensive inpatient rehab setting. Physiatrist is providing close team supervision and 24 hour management of active medical problems listed below. Physiatrist and rehab team continue to assess barriers to discharge/monitor patient progress toward  functional and medical goals  Care Tool:  Bathing    Body parts bathed by patient: Right arm, Chest, Abdomen, Front perineal area, Right upper leg, Left upper leg, Face, Left arm, Buttocks, Left lower leg, Right lower leg   Body parts bathed by helper: Left arm, Buttocks, Right lower leg, Left lower leg     Bathing assist Assist Level: Contact Guard/Touching assist (shower on BSC)     Upper Body Dressing/Undressing Upper body dressing   What is the patient wearing?: Pull over shirt    Upper body assist Assist Level: Supervision/Verbal cueing    Lower Body Dressing/Undressing Lower body dressing      What is the patient wearing?: Incontinence brief, Pants     Lower body assist Assist for lower body dressing: Moderate Assistance - Patient 50 - 74% (in the stedy)     [place] for toileting: Moderate Assistance - Patient 50 - 74%     Transfers Chair/bed transfer  Transfers assist     Chair/bed transfer assist level: Dependent - mechanical lift     Locomotion Ambulation   Ambulation assist   Ambulation activity did not occur: Safety/medical concerns          Walk 10 feet activity   Assist  Walk 10 feet activity did not occur: Safety/medical concerns        Walk 50 feet activity   Assist Walk 50 feet with 2 turns activity did not occur: Safety/medical concerns         Walk 150 feet activity   Assist Walk 150 feet activity did not occur: Safety/medical concerns         Walk 10 feet on uneven surface  activity   Assist Walk 10 feet on uneven surfaces activity did not occur: Safety/medical concerns         Wheelchair     Assist Is the patient using a wheelchair?: Yes Type of Wheelchair: Manual    Wheelchair assist level: Dependent - Patient 0% Max wheelchair distance: 150    Wheelchair 50 feet with 2 turns activity    Assist        Assist Level: Dependent - Patient 0%    Wheelchair 150 feet activity     Assist      Assist Level: Dependent - Patient 0%   Blood pressure 133/67, pulse 85, temperature 98.2 F (36.8 C), temperature source Oral, resp. rate 18, height 5' 7"$  (1.702 m), weight 84.7 kg,  SpO2 95 %.  Medical Problem List and Plan: 1. Functional deficits secondary to debility due to acute on chronic HFpEF, atrial fibrillation with RVR             -patient may shower             -ELOS/Goals: 2/21, min A/mod A for gait at least 10-15 feet  -Continue CIR, PT/OT -Power WC eval today-- wants to know what she will go home with since her w/c won't be delivered for 4-5 weeks-- ask weekday team on Monday -Should get rental WC, will check with therapy    2.  Antithrombotics: -DVT/anticoagulation:  Lovenox 40 mg daily (No full-dose AC d/t Hx SAH 2007)             -antiplatelet therapy: ASA 325 daily per IM until OP cards f/u 3. Pain Management:  Oxycodone or tramadol prn. Kpad ordered -encourage use of Voltaren gel 2g QID for OA right shoulder/left knee.  4. Mood/Behavior/Sleep: LCSW to follow for evaluation and support.              -Melatonin prn--12/05/22 ordered as scheduled 27m QHS, Trazodone PRN   -12/06/22 sleeping improving, continue current regimen -12/12/22 wants melatonin to be changed to PRN, ordered this; advised she will need to request sleep meds if she needs them             -antipsychotic agents: N/A  -Nortriptyline 152mQHS, Neurontin 10040mHS 5. Neuropsych/cognition: This patient is capable of making decisions on her own behalf.  -start xanax 0.25 BID PRN for anxiety -Consulted Psychiatry- psychiatry reviewed her case and case discussed with  TakSheran Favasych recommended continuation current medications encourage therapy and did not feel this issue would benefit from inpatient psych, ok to cancel consult.   -1/31 start low dose duloxetine 65m17miscussed risks and benefits of this medication,may also help with joint  pain -2/2 increase duloxetine to 30mg84mly -2/7 increased duloxetine to 60mg 58m2/13 anxiety a little improved per patient, continue duloxetine  6. Skin/Wound Care: Add lotion to BLE and hands             -routine pressure relief measures.  7. Fluids/Electrolytes/Nutrition: Monitor I/O. Intake improving. Monitor weekly BMPs, next 01/04/23 -May need to stay dry to avoid fluid overload. 8. Acute on chronic diastolic CHF: Strict I/O w/daily weights. -Cardiac diet. Monitor for signs of overload.  -continue Lasix 40mg Q63mldactone 12.5mg QD,65mtoprolol 100mg QD,25mvastatin 40mg QD a86mosartan 50mg QD -m3mor for recurrent hyperkalemia (Losartan dose decreased 01/14) -2/1 A little up from a few days ago but appears stable overall -2/19 wts stable  Filed Weights   01/02/23 0509 01/03/23 0454 01/04/23 0516  Weight: 83.9 kg 84.7 kg 84.7 kg     9. A Fib w/RVR: Monitor HR TID--continue Cardizem 180mg QD and61moprolol 100mg QD  10.28m: BPs with some lability, 130s-160s SBP -12/05/22 monitor for now, Losartan decreased 11/29/22, could consider going back to home dosing if appropriate -1/23 BP has been elevated, will increase losartan to 50mg  -2/13 w2mcontrolled overall, decrease losartan dose to 25mg due to K+1mvated -2/19 well controlled overall, continue to monitor   Vitals:   12/31/22 1956 01/01/23 0450 01/01/23 1337 01/01/23 1937  BP: 118/71 124/61 116/62 126/79   01/02/23 0239 01/02/23 1329 01/02/23 1409 01/02/23 1940  BP: 120/66 (!) 133/57 124/60 (!) 133/56   01/03/23 0257 01/03/23 1326 01/03/23 2039 01/04/23 0254  BP: 127/68 (!) 161/85 133/83  133/67     11.  T1DM: Hgb A1c- 7.4. Monitor BS ac/hs and use SSI for elevated BS             -continue insulin glargline 15 units daily.  -May need to add meal coverage-->used 5- 7units tid prn PTA.  -12/05/22 increase Semglee to 16U QD -1/24 add 2 units mealtime insulin -1/25 increase mealtime to 4 units insulin, decrease lantus to  14 units -1/26 decrease mealtime insulin to 3 units due to hypoglycemia, semglee to AM -1/31 trending a little lower, decrease mealtime insulin dose to 2 units -2/1 decrease semglee to 11 units from 14units, recommend nighttime snack -2/2 decreased to moderate resistance correctional SSI -12/20/22 increase mealtime novolog to 3U again and leave Semglee as is -12/21/22 increase semglee to 13u -2/9 will adjust semglee to 16units, stop mealtime- per pharm recs -2/12 CBG's with a big drop today after being in the 200's-->No recorded intake after breakfast 2/11. -2/13 Decrease semglee to 13 units due to hypoglycemia, I suspect she does not eat consistent carbs with each meal  -2/14 HS insulin and  SSI insulin scale was decreased, will consult DM coordinator -2/15 Novolog 3 units TID restarted per DM coordinator recs, appreciate assistance -2/16 hypoglycemia this AM, decrease lantus to 10units,  decrease mealtime insulin to 2 units for now, glucose control may improve as UTI is treated -01/02/23 CBGs similar in pattern, cont regimen for now, monitor for changes -2/19 continue current regimen and monitor  CBG (last 3)  Recent Labs    01/03/23 1702 01/03/23 2109 01/04/23 0556  GLUCAP 281* 292* 97      12. Chronic bilateral shoulder and left knee pain: Oxycodone prn -Refusing voltaren gel for local measures-->encourage use             -Aquathermia for local measures.              -L knee: Follows OP ortho, no benefit from injections in the past  -L knee pain improved with knee brace  13. Upper respiratory infection, Nocturnal hypoxia: Question sleep apnea.  -Encourage pulmonary hygiene -Respiratory viral panel negative x2 inpatient -Remains with cough, intermittent SOB - add PRN inhaler, monitor  14.  Pre-renal azotemia: Likely due to diuresis. BUN up but SCr improving.   -Continue to monitor.  -2/15 BUN and Cr stable, recheck Monday 01/04/23 stable  15. Overactive bladder, incontinence:  hx of, on Myrbetriq 52m QD; now off purewick -12/05/22 having increased frequency and incontinence, ordered U/A, UCx, and scheduled voiding program -12/06/22 U/A c/w UTI, see #16 -Continue myrbetriq,she is mostly continent with occasional incontinent episodes -12/28/22 continues to have improvement, cont regimen  16. UTI 12/06/22:  -U/A c/w UTI, ordered Macrobid 1066mBID x5 days (12/06/22>>12/10/22 last dose), UCx pending- Ecoli sensitive to macrobid, completed course, symptoms resolved -2/7 cloudy urine yesterday, asymptomatic, continue to monitor -2.14 having intermittant burning with urination, check U/A and culture -2/15 keflex started for UTI 7 day, follow urine culture -2/16 E coli 100,000 and E faecalis 80,000 CFU. E coli sensitive to cefazolin, symptoms improved, continue keflex -01/02/23 Keflex fell off after 7 doses (last dose 01/01/23 AM, total of 2 full days worth of Keflex given)-- reordered, but Pharmacy states Keflex won't cover E.faecalis so switched to Macrobid 10013mID x5 days (end 01/06/23 PM)  17.  Hypokalemia  -33m69m2, recheck tomorrow  -K+ up to 3.6, losartan increased today, Recheck tomorrow -1/25 K+ now elevated hyperkalemia at 5.4, lokelma 5g  x1, stop potassium supplementation,  discussed with pharmacy, recheck tomorrow -2/12--5.3 no hemolysis reported on bmet. No scheduled potassium today. Give small dose of lokelma today. Also on losartan and aldactone---hold aldactone tomorrow pending repeat bmet. Consider stopping losartan too if bp's are controlled.  -2/13 K+ down to 3.9, decrease losartan dose to 96m -2/19 stable at 3.5  18. Dizziness -1/22 Will start meclizine 12.561mPRN, check orthostatic VS -She has HX of vertigo going back to 2013, seen by Dr. WoJacelyn [facility]peurology, felt to be related to vestibulopathy -1/30 fall yesterday but no injury -2/2 increase meclizine to 25103mID prn -12/28/22 --did not report dizziness. Continue meclizine prn -01/02/23 meclizine helping  mostly, not causing excessive drowsiness, continue regimen  19. Constipation/Diarrhea: has chronic constipation, now with diarrhea 01/03/23  -Schedule miralax QD, PRN dulcolax supp and fleet enema -1/26 had vomiting with  sorbitol yesterday, continue miralax daily, she had large BM yesterday -1/29 miralax increased to BID -1/30 she declines stronger medication at this time,order sorbitol PRN -2/1 large bm yesterday, improved  -12/19/22 now having increased stool frequency, wants to change Miralax to BID PRN rather than scheduled; also wants Colace 100m67mD PRN in case she needs it; knows to ask -12/28/22 multiple stools over last 24 hours--on no scheduled bowel meds -2/14 LBM today, liquid, does not appear to be on laxatives scheduled, continue monitor -01/02/23 BMs improving, cont to monitor -01/03/23 multiple loose BMs yesterday, ?food related vs abx-- slowing down this morning. Ordered imodium 2mg 39m PRN, monitor closely 2/19 loose BM improved  20. R shoulder pain  -Xray on 1/15 with postsurgical changes, continue voltaren gel, Kpad  -1/31 Duloxetine started as this may help with joint pain also -2/2 Duloxetine increase to 30mg 12m increased duloxetine to 60mg  3m: 31 days A FACE TO FACE EVALUATION WAS PERFORMED  Tyesha Joffe ShJennye Boroughs024, 8:26 AM

## 2023-01-04 NOTE — Progress Notes (Signed)
Physical Therapy Session Note  Patient Details  Name: [name] MRN: [id number] Date of Birth: 1951/05/04  Today's Date: 01/04/2023 PT Individual Time: 1345-1425 PT Individual Time Calculation (min): 40 min   Short Term Goals: Week 3:  PT Short Term Goal 1 (Week 3): Pt will perform supine<>sit with minA consistently. PT Short Term Goal 1 - Progress (Week 3): Met PT Short Term Goal 2 (Week 3): Pt will performs sit<>stand using LRAD with modA consistently. (from ~20in height) PT Short Term Goal 2 - Progress (Week 3): Met (from ~20in height seat surface) PT Short Term Goal 3 (Week 3): Pt will perform bed<>chair transfer consistently with modA PT Short Term Goal 3 - Progress (Week 3): Progressing toward goal PT Short Term Goal 4 (Week 3): Pt will ambulate at least 10' with modA +1 and LRAD. PT Short Term Goal 4 - Progress (Week 3): Not met Week 4:  PT Short Term Goal 1 (Week 4): = to LTGs based on ELOS  Skilled Therapeutic Interventions/Progress Updates:   Received pt sitting in power WC, pt agreeable to PT treatment, and denied any pain during session. Session with emphasis on functional mobility/transfers, power WC mobility, and simulated car transfers. Pt performed power WC mobility 160f x 2 trials with supervision to/from ortho gym. Discussed technique for simulated car transfers and pt reported her [place] and transferred to car simulator, however frame of car simulator blocking Stedy from getting close enough to safely transfer - therefore returned to power WC and transferred to/from car via stand<>pivot with RW and min +2 and assist to get RW around wheels of power chair. Pt required mod A for LLE management but unable to get RLE into car. Readjusted L knee sleeve and returned to room and concluded session with pt sitting in power WC with all needs within reach.    Therapy Documentation Precautions:   Precautions Precautions: Shoulder Type of Shoulder Precautions: R-shoulder pain with ROM/WB Precaution Comments: History of frequent falls at home. Restrictions Weight Bearing Restrictions: No  Therapy/Group: Individual Therapy AAlfonse AlpersPT, DPT  01/04/2023, 7:12 AM

## 2023-01-04 NOTE — Progress Notes (Signed)
Occupational Therapy Session Note  Patient Details  Name: [name] MRN: [id number] Date of Birth: 11-24-1950  Today's Date: 01/04/2023 OT Individual Time: 0950-1100 OT Individual Time Calculation (min): 70 min    Short Term Goals: Week 4:  OT Short Term Goal 1 (Week 4): STG= LTG d/t ELOS  Skilled Therapeutic Interventions/Progress Updates:    Pt received sitting in the power w/c with no c/o pain at rest. Discussed d/c planning, tomorrow's family education and car transfer at length. Pt agreeable to take shower today. She stood from the w/c in the stedy with close (S). She was transferred to the [facility] in the walk in shower. She completed clothing management in standing with CGA. She demonstrated improvement in standing > sit transfer in the stedy with much more controlled descent. She completed UB bathing and LB with use of a washcloth and LH sponge with (S). Assist provided for water management and set up of all items. Discussed carryover to home. She stood with CGA from the [facility] using the stedy. From the power w/c she donned a shirt with (S). No reacher available so assist provided to don pants. She stood at the sink with RW anterior but using combo of sink and RW with only CGA!!! Incredible progress! Again discussed carryover to home and brainstormed through places this could work at home. She completed 4x more sit <>stands, trialing using RW only (but secured against sink so no sliding) and this was still difficult and required heavier mod A. She was left sitting up in the power w/c with all needs met.   Therapy Documentation Precautions:  Precautions Precautions: Shoulder Type of Shoulder Precautions: R-shoulder pain with ROM/WB Precaution Comments: History of frequent falls at home. Restrictions Weight Bearing Restrictions: No  Therapy/Group: Individual Therapy  Curtis Sites 01/04/2023, 6:42 AM

## 2023-01-04 NOTE — Progress Notes (Signed)
Occupational Therapy Session Note  Patient Details  Name: [name] MRN: [id number] Date of Birth: 1951/03/04  Today's Date: 01/04/2023 OT Individual Time: 1445-1600 OT Individual Time Calculation (min): 75 min    Short Term Goals: Week 4:  OT Short Term Goal 1 (Week 4): STG= LTG d/t ELOS  Skilled Therapeutic Interventions/Progress Updates:    Pt received in [place] and ready for therapy. Pt agreeable to going to the gym to focus on sit to stands with RW.  Prior to +2 A arriving had pt work on warm up exercises of knee extension, hip abd/add, ankle mobility, torso twists.  Focused on establishing a good prep position for successful sit to stands.  Reviewed foot position and angle and then she practiced forward leans then integrated pushing up with B hands to lift hips. Pt able to lift hips into a semi squat with B hands 10 x in a row for 2 sets with no A.  Prior to +2 A, pt decided she would try a full stand. Pt able to push up then transfer hands to RW with only min A.  Once standing she had a posterior lean but was then able to correct for 2 stands. Stood 1 x for 2 minutes.  +2 A arrived and pt continued to work on sit to stands 4x in which she was able to achieve BUT each time she would then push back.  Tried several times to make adjustments to her starting position. In [place], the folded foot rest sits forward of edge of seat so pt has to come close to the edge of the wc. She was eventually able to rise a 3rd and 4th time with a smooth transition.  Advised her to practice standing upright when in the STEDY lift and engaging her leg muscles to strengthen legs for sit to stands to RW.   Pt's spouse arrived and when session time ended they returned to her room.    Therapy Documentation Precautions:  Precautions Precautions: Shoulder Type of Shoulder Precautions: R-shoulder pain with ROM/WB Precaution Comments: History of frequent falls at home. Restrictions Weight Bearing Restrictions: No     Pain: Pain Assessment Pain Scale: 0-10 Pain Score: 0-No pain    Therapy/Group: Individual Therapy  [place] 01/04/2023, 9:36 AM

## 2023-01-04 NOTE — Discharge Summary (Signed)
Physician Discharge Summary  Patient ID: [name] MRN: [id number] DOB/AGE: [birth year] 72 y.o.  Admit date: 12/04/2022 Discharge date: 01/06/2023  Discharge Diagnoses:  Principal Problem:   Debility Active Problems:   Essential hypertension   Type 1 diabetes mellitus on insulin therapy (HCC)   Primary osteoarthritis of both hands   Primary osteoarthritis of left knee   A-fib (HCC)   Chronic diastolic congestive heart failure (HCC)   Constipation   Acute cystitis without hematuria   UTI (urinary tract infection)   Anxiety state   Discharged Condition: stable  Significant Diagnostic Studies: No results found.  Labs:  Basic Metabolic Panel: Recent Labs  Lab 12/29/22 1800 12/31/22 0807 01/04/23 0532  NA 134* 138 137  K 4.9 3.6 3.5  CL 95* 98 97*  CO2 31 33* 28  GLUCOSE 266* 100* 91  BUN 20 17 9   CREATININE 0.77 0.74 0.64  CALCIUM 9.9 9.7 9.8    CBC:    Latest Ref Rng & Units 01/04/2023    5:32 AM 12/28/2022    9:12 AM 12/21/2022    6:20 AM  CBC  WBC 4.0 - 10.5 K/uL 4.9  6.1  5.6   Hemoglobin 12.0 - 15.0 g/dL 28.4  13.2  44.0   Hematocrit 36.0 - 46.0 % 45.3  46.8  45.7   Platelets 150 - 400 K/uL 168  191  251      CBG: Recent Labs  Lab 01/05/23 0752 01/05/23 1133 01/05/23 1626 01/05/23 2116 01/06/23 0656  GLUCAP 97 287* 229* 129* 99    Brief HPI:   [name] is a 72 y.o. female with history of SAH with vestibulopathy, spinal tumor resection with right foot drop, T1DM, HTN who was admitted on 11/26/2022 with 3 to 4-week history of progressive shortness of breath ED being to orthopnea and tachycardia.  She was found to have acute on chronic diastolic CHF as well as A-fib with flutter.  CTA chest was negative for PE but showed a large right pleural effusion.  She was treated with IV diuresis, IV diltiazem and loaded with digoxin for rate control.  Right pleural effusion was tapped but she continued to have issues with hypoxia specially at night therefore  BiPAP ordered.  Metoprolol was added for rate control as well as low-dose ASA as she was not felt to be a candidate for DOAC.  She has had issues with acute on chronic right shoulder pain, labile blood sugars as well as with nurse.  Therapy was working with patient on standing attempts and steady.  CIR was recommended due to functional decline.   Hospital Course: [name] was admitted to rehab 12/04/2022 for inpatient therapies to consist of PT  and OT at least three hours five days a week. Past admission physiatrist, therapy team and rehab RN have worked together to provide customized collaborative inpatient rehab. Her blood pressures were monitored on TID basis with daily weights to monitor for signs of over load. BP has been controlled on current regimen and weight is ranging at 184 to 186 lbs. Follow up check of BMET showed renal status and potassium levels to be WNL.  Transient hyponatremia has resolved. Follow up CBC showed H/H and WBC to be stable. No respiratory status noted during her stay and HR has been controlled. Constipation has resolved with titration of bowel regimen. She was found to have E Faecalis UTI which was treated with 5 day course of Macrobid, She started having diarrhea with addition of latter therefore  laxatives were d/c and lomotil used prn with good results.     She continued to be limited by right knee pain with instability as well as right shoulder pain which has affected her overall progress as well as goals. Voltaren gel was added to help with shoulder pain and oxycodone has been used on prn basis to help with activity tolerance. She was advised to use oxycodone prn with home therapy and to taper to ultram over next 1-2 weeks.  Duloxetine was added to help manage severe anxiety that was affecting therapy and titrated upwards to help with pain management.  Xanax was use prn initially with minimal use noted  prior to d/c. Her diabetes has been monitored with ac/hs CBG checks and  SSI was use prn for tighter BS control. Her blood sugars have been labile with multiple changes in dose and input from diabetic coordinator.  She has made slow and gradual gains during her stay. She requires min assist at wheelchair level. She will continue to receive follow up HHPT and HHOT by [facility] after discharge.    Rehab course: During patient's stay in rehab weekly team conferences were held to monitor patient's progress, set goals and discuss barriers to discharge. At admission, patient required max assist with ADL tasks and with mobility.  She  has had improvement in activity tolerance, balance, postural control as well as ability to compensate for deficits. She is able to perform ADL tasks with min assist. She is using Stedy with CGA for transfers and fluctuates from CGA to mod to max assist for stand pivot transfers.  She requires supervision with verbal cues for use of power wheelchair for 500'. Family education has been completed.    Disposition: Home  Diet: Carb Modified.   Special Instructions: Monitor BS ac/hs and follow up with PCP for input on up titration  Discharge Instructions     AMB referral to CHF clinic   Complete by: As directed    Admission for CHF/A fib/Pleural effusion      Allergies as of 01/06/2023       Reactions   Codeine    Tape         Medication List     STOP taking these medications    insulin glargine 100 UNIT/ML injection Commonly known as: LANTUS Replaced by: Lantus SoloStar 100 UNIT/ML Solostar Pen   ondansetron 4 MG disintegrating tablet Commonly known as: ZOFRAN-ODT       TAKE these medications    acetaminophen 325 MG tablet Commonly known as: TYLENOL Take 1-2 tablets (325-650 mg total) by mouth every 4 (four) hours as needed for mild pain.   alendronate 70 MG tablet Commonly known as: FOSAMAX Take 70 mg by mouth once a week. Notes to patient: May resume at home   ammonium lactate 12 % lotion Commonly known  as: LAC-HYDRIN Apply topically 2 (two) times daily.   ascorbic acid 500 MG tablet Commonly known as: VITAMIN C Take 500 mg by mouth daily.   aspirin 325 MG tablet Take 1 tablet (325 mg total) by mouth daily.   calcium-vitamin D 250-100 MG-UNIT tablet Take 1 tablet by mouth 2 (two) times daily.   Centrum Silver 50+Women Tabs Take 1 tablet by mouth daily.   Co Q 10 100 MG Caps Take 100 mg by mouth daily.   diclofenac Sodium 1 % Gel Commonly known as: VOLTAREN Apply 2 g topically 4 (four) times daily.   diltiazem 180 MG 24 hr capsule  Commonly known as: CARDIZEM CD Take 1 capsule (180 mg total) by mouth daily.   diphenoxylate-atropine 2.5-0.025 MG tablet--Rx 5 tabs Commonly known as: LOMOTIL Take 1 tablet by mouth 2 (two) times daily as needed for diarrhea or loose stools.   DULoxetine 60 MG capsule Commonly known as: CYMBALTA Take 1 capsule (60 mg total) by mouth daily.   fish oil-omega-3 fatty acids 1000 MG capsule Take 2 g by mouth daily.   furosemide 40 MG tablet Commonly known as: LASIX Take 1 tablet (40 mg total) by mouth daily.   gabapentin 100 MG capsule Commonly known as: NEURONTIN Take 1 capsule (100 mg total) by mouth at bedtime.   guaiFENesin 200 MG tablet Take 1 tablet (200 mg total) by mouth every 4 (four) hours as needed for cough or to loosen phlegm.   hydrocerin Crea Apply 1 Application topically 2 (two) times daily.   insulin lispro 100 UNIT/ML injection Commonly known as: HUMALOG Inject into the skin 3 (three) times daily before meals. Notes to patient: Use 2 units for now--monitor blood sugars before meals and at bedtime. Contact Dr. Jarold Motto for input on increasing dose.    Lantus SoloStar 100 UNIT/ML Solostar Pen Generic drug: insulin glargine Inject 9 Units into the skin at bedtime. Replaces: insulin glargine 100 UNIT/ML injection   latanoprost 0.005 % ophthalmic solution Commonly known as: XALATAN 1 drop at bedtime.   losartan 25  MG tablet Commonly known as: COZAAR Take 1 tablet (25 mg total) by mouth daily.   meclizine 25 MG tablet Commonly known as: ANTIVERT Take 1 tablet (25 mg total) by mouth 3 (three) times daily as needed for dizziness.   melatonin 5 MG Tabs Take 1 tablet (5 mg total) by mouth at bedtime as needed.   metoprolol succinate 100 MG 24 hr tablet Commonly known as: TOPROL-XL Take 1 tablet (100 mg total) by mouth daily. Take with or immediately following a meal.   Myrbetriq 50 MG Tb24 tablet Generic drug: mirabegron ER Take 50 mg by mouth daily.   nitrofurantoin (macrocrystal-monohydrate) 100 MG capsule Commonly known as: MACROBID Take 1 capsule (100 mg total) by mouth every 12 (twelve) hours.   nortriptyline 10 MG capsule Commonly known as: PAMELOR Take 1 capsule (10 mg total) by mouth at bedtime.   OneTouch Ultra test strip Generic drug: glucose blood 1 each 4 (four) times daily.   oxyCODONE-acetaminophen 5-325 MG tablet--Rx# 20 pills Commonly known as: PERCOCET/ROXICET Take 1 tablet by mouth every 6 (six) hours as needed for severe pain.   phenol 1.4 % Liqd Commonly known as: CHLORASEPTIC Use as directed 1 spray in the mouth or throat as needed for throat irritation / pain.   pravastatin 40 MG tablet Commonly known as: PRAVACHOL Take 40 mg by mouth daily.   PROBIOTIC PO Take by mouth daily.   RELION INSULIN SYR 0.3ML/31G 31G X 5/16" 0.3 ML Misc Generic drug: Insulin Syringe-Needle U-100 USE 4 SYRINGES DAILY   spironolactone 25 MG tablet Commonly known as: ALDACTONE Take 0.5 tablets (12.5 mg total) by mouth daily.   STOOL SOFTENER PO Take by mouth daily.   TechLite Pen Needles 31G X 8 MM Misc Generic drug: Insulin Pen Needle Use daily at 6 (six) AM.   traMADol 50 MG tablet Commonly known as: ULTRAM Take 50 mg by mouth 2 (two) times daily as needed.        Follow-up Information     Garlan Fillers, MD Follow up.   Specialty: Internal Medicine  Why:  Call in 1-2 days for post hospital follow up Contact information: [address] [place] [address] [phone]         Fanny Dance, MD. Call.   Specialty: Physical Medicine and Rehabilitation Why: As needed Contact information: [address] [place] [address] [phone]         [facility] [address] Office Follow up.   Specialty: Cardiology Why: office will call you with follow up appointment Contact information: [address], [address] [place] [address] [phone]                Signed: Jacquelynn Cree 01/07/2023, 6:05 PM

## 2023-01-05 ENCOUNTER — Other Ambulatory Visit (HOSPITAL_COMMUNITY): Payer: Self-pay

## 2023-01-05 DIAGNOSIS — E10649 Type 1 diabetes mellitus with hypoglycemia without coma: Secondary | ICD-10-CM

## 2023-01-05 LAB — GLUCOSE, CAPILLARY
Glucose-Capillary: 65 mg/dL — ABNORMAL LOW (ref 70–99)
Glucose-Capillary: 66 mg/dL — ABNORMAL LOW (ref 70–99)
Glucose-Capillary: 97 mg/dL (ref 70–99)
Glucose-Capillary: 287 mg/dL — ABNORMAL HIGH (ref 70–99)
Glucose-Capillary: 229 mg/dL — ABNORMAL HIGH (ref 70–99)
Glucose-Capillary: 129 mg/dL — ABNORMAL HIGH (ref 70–99)

## 2023-01-05 MED ORDER — DICLOFENAC SODIUM 1 % EX GEL
2.0000 g | Freq: Four times a day (QID) | CUTANEOUS | 0 refills | Status: DC
  Filled 2023-01-05: qty 100, 13d supply, fill #0

## 2023-01-05 MED ORDER — HYDROCERIN EX CREA: 1.0000 | TOPICAL_CREAM | Freq: Two times a day (BID) | CUTANEOUS | 0 refills | Status: DC

## 2023-01-05 MED ORDER — AMMONIUM LACTATE 12 % EX LOTN
TOPICAL_LOTION | Freq: Two times a day (BID) | CUTANEOUS | 0 refills | Status: AC
  Filled 2023-01-05: qty 400, fill #0

## 2023-01-05 MED ORDER — LOSARTAN POTASSIUM 25 MG PO TABS
25.0000 mg | ORAL_TABLET | Freq: Every day | ORAL | 0 refills | Status: AC
  Filled 2023-01-05: qty 30, 30d supply, fill #0

## 2023-01-05 MED ORDER — FUROSEMIDE 40 MG PO TABS
40.0000 mg | ORAL_TABLET | Freq: Every day | ORAL | 0 refills | Status: DC
  Filled 2023-01-05: qty 30, 30d supply, fill #0

## 2023-01-05 MED ORDER — SPIRONOLACTONE 25 MG PO TABS
12.5000 mg | ORAL_TABLET | Freq: Every day | ORAL | 0 refills | Status: AC
  Filled 2023-01-05: qty 30, 60d supply, fill #0

## 2023-01-05 MED ORDER — DILTIAZEM HCL ER COATED BEADS 180 MG PO CP24
180.0000 mg | ORAL_CAPSULE | Freq: Every day | ORAL | 0 refills | Status: AC
  Filled 2023-01-05: qty 30, 30d supply, fill #0

## 2023-01-05 MED ORDER — GABAPENTIN 100 MG PO CAPS
100.0000 mg | ORAL_CAPSULE | Freq: Every day | ORAL | 0 refills | Status: AC
  Filled 2023-01-05: qty 30, 30d supply, fill #0

## 2023-01-05 MED ORDER — DIPHENOXYLATE-ATROPINE 2.5-0.025 MG PO TABS
1.0000 | ORAL_TABLET | Freq: Two times a day (BID) | ORAL | 0 refills | Status: DC | PRN
  Filled 2023-01-05: qty 5, 3d supply, fill #0

## 2023-01-05 MED ORDER — OXYCODONE-ACETAMINOPHEN 5-325 MG PO TABS
1.0000 | ORAL_TABLET | Freq: Four times a day (QID) | ORAL | 0 refills | Status: DC | PRN
  Filled 2023-01-05: qty 20, 5d supply, fill #0

## 2023-01-05 MED ORDER — ACETAMINOPHEN 325 MG PO TABS
325.0000 mg | ORAL_TABLET | ORAL | 0 refills | Status: AC | PRN
  Filled 2023-01-05: qty 100, 9d supply, fill #0

## 2023-01-05 MED ORDER — MECLIZINE HCL 25 MG PO TABS
25.0000 mg | ORAL_TABLET | Freq: Three times a day (TID) | ORAL | 0 refills | Status: AC | PRN
  Filled 2023-01-05: qty 30, 10d supply, fill #0

## 2023-01-05 MED ORDER — NITROFURANTOIN MONOHYD MACRO 100 MG PO CAPS
100.0000 mg | ORAL_CAPSULE | Freq: Two times a day (BID) | ORAL | 0 refills | Status: DC
  Filled 2023-01-05: qty 1, 1d supply, fill #0

## 2023-01-05 MED ORDER — NORTRIPTYLINE HCL 10 MG PO CAPS
10.0000 mg | ORAL_CAPSULE | Freq: Every day | ORAL | 0 refills | Status: AC
  Filled 2023-01-05: qty 30, 30d supply, fill #0

## 2023-01-05 MED ORDER — GUAIFENESIN 200 MG PO TABS: 200.0000 mg | ORAL_TABLET | ORAL | 0 refills | Status: DC | PRN

## 2023-01-05 MED ORDER — INSULIN GLARGINE 100 UNIT/ML SOLOSTAR PEN
9.0000 [IU] | PEN_INJECTOR | Freq: Every day | SUBCUTANEOUS | 11 refills | Status: AC
  Filled 2023-01-05: qty 3, 33d supply, fill #0

## 2023-01-05 MED ORDER — ASPIRIN 325 MG PO TABS
325.0000 mg | ORAL_TABLET | Freq: Every day | ORAL | 0 refills | Status: DC
  Filled 2023-01-05: qty 30, 30d supply, fill #0

## 2023-01-05 MED ORDER — INSULIN GLARGINE-YFGN 100 UNIT/ML ~~LOC~~ SOLN
9.0000 [IU] | Freq: Every day | SUBCUTANEOUS | Status: DC
  Administered 2023-01-06: 9 [IU] via SUBCUTANEOUS
  Filled 2023-01-05: qty 0.09

## 2023-01-05 MED ORDER — MELATONIN 5 MG PO TABS
5.0000 mg | ORAL_TABLET | Freq: Every evening | ORAL | 0 refills | Status: DC | PRN
  Filled 2023-01-05: qty 30, 30d supply, fill #0

## 2023-01-05 MED ORDER — METOPROLOL SUCCINATE ER 100 MG PO TB24
100.0000 mg | ORAL_TABLET | Freq: Every day | ORAL | 0 refills | Status: AC
  Filled 2023-01-05: qty 30, 30d supply, fill #0

## 2023-01-05 MED ORDER — PHENOL 1.4 % MT LIQD: 1.0000 | OROMUCOSAL | 0 refills | Status: DC | PRN

## 2023-01-05 MED ORDER — DULOXETINE HCL 60 MG PO CPEP
60.0000 mg | ORAL_CAPSULE | Freq: Every day | ORAL | 0 refills | Status: AC
  Filled 2023-01-05: qty 30, 30d supply, fill #0

## 2023-01-05 NOTE — Progress Notes (Signed)
Physical Therapy Session Note  Patient Details  Name: [name] MRN: [id number] Date of Birth: October 29, 1951  Today's Date: 01/05/2023 PT Individual Time: [id number] PT Individual Time Calculation (min): 32 min   Short Term Goals: Week 4:  PT Short Term Goal 1 (Week 4): = to LTGs based on ELOS  Skilled Therapeutic Interventions/Progress Updates:    Session focused on functional bed mobility, dynamic sitting balance and sit <> stands while performing dressing task EOB, and transfer with RW to the power w/c. Pt able to come to EOB using bed rails for support with supervision overall due to posterior lean once achieved seated position. During dynamic dressing tasks from EOB pt maintained functional balance with supervision and able to self correct for L and posterior leans. Utilized stedy for sit <> stand for donning of pants (how pt will perform task at home per report) with CGA for sit <> stand and supervision for standing balance. Then utilized RW for transfer from bed to power w/c with bed height elevated. Required 3-4 attempts for sit <> stands with min to mod assist and facilitation for anterior weightshift to achieve full upright position. Once in standing, pt able to perform stand pivot to [facility] with min to mod assist and extra time, cues for positoning of RW and overall sequencing. Once foot plates assisted in position, pt able to reposition and manage PWC without assist. D/c planning discussed throughout session. End of session with all needs in reach.   Therapy Documentation Precautions:  Precautions Precautions: Shoulder Type of Shoulder Precautions: R shoulder pain and limited ROM Precaution Comments: History of falls at home Restrictions Weight Bearing Restrictions: No  Pain:  Pt with L knee pain in standing (brace applied). Not rated. Rest breaks as needed.  Mobility: Bed Mobility Rolling Right: Independent with assistive device Rolling Left: Independent with assistive  device Supine to Sit: Supervision/Verbal cueing Transfers Transfers: Sit to Stand;Stand Pivot Transfers;Stand to Sit;Transfer via [place] to Stand: Moderate Assistance - Patient 50-74% Stand to Sit: Minimal Assistance - Patient > 75% Stand Pivot Transfers: Minimal Assistance - Patient > 75%;Moderate Assistance - Patient 50 - 74% Stand Pivot Transfer Details: Verbal cues for sequencing;Verbal cues for technique;Verbal cues for safe use of DME/AE;Manual facilitation for weight shifting Stand Pivot Transfer Details (indicate cue type and reason): posterior lean in standing; inconsistent Transfer (Assistive device): Rolling walker Transfer via Lift Equipment: Stedy (CGA for sit <> stand in [place]) Locomotion : Gait Ambulation: No Gait Gait: No Architect: Yes Wheelchair Assistance: Chartered loss adjuster: Power Wheelchair Parts Management: Needs assistance (foot plates on power w/c) Distance: 500  Trunk/Postural Assessment : Cervical Assessment Cervical Assessment: Exceptions to [facility] (forward head) Thoracic Assessment Thoracic Assessment: Exceptions to [facility] (rounded shoulders) Lumbar Assessment Lumbar Assessment: Exceptions to [facility] (posterior pelvic tilt) Postural Control Postural Control: Deficits on evaluation Trunk Control: mild L lean in sitting  Balance: Static Sitting Balance Static Sitting - Level of Assistance: 5: Stand by assistance Dynamic Sitting Balance Dynamic Sitting - Level of Assistance: 5: Stand by assistance Static Standing Balance Static Standing - Balance Support: Bilateral upper extremity supported Static Standing - Level of Assistance: 4: Min assist    Therapy/Group: Individual Therapy  Canary Brim Ivory Broad, PT, DPT, CBIS  01/05/2023, 9:45 AM

## 2023-01-05 NOTE — Progress Notes (Signed)
Hypoglycemic Event  CBG: 65  Treatment: 8 oz juice/soda  Symptoms: None  Follow-up CBG: Time:0705 CBG Result:66  Possible Reasons for Event: Unknown  Comments/MD notified:Charge nurse made aware and pt has breakfast tray along with orange juice    [name]

## 2023-01-05 NOTE — Progress Notes (Signed)
Physical Therapy Discharge Summary  Patient Details  Name: [name] MRN: [id number] Date of Birth: [birth year]  Date of Discharge from PT service:January 06, 2023  Patient has met 7 of 8 long term goals due to improved activity tolerance, improved balance, improved postural control, increased strength, increased range of motion, ability to compensate for deficits, improved awareness, and improved coordination. Patient to discharge at a wheelchair level Supervision. Patient's care partner is independent to provide the necessary physical assistance at discharge. Pt's family attended family education training on 2/20 and verbalized and demonstrated ability to perform tasks to ensure safe discharge home.   Reasons goals not met: Pt did not meet bed mobility goal of CGA as pt currently requires min A for BLE management when transitioning sit<>supine due to LE weakness.   Recommendation:  Patient will benefit from ongoing skilled PT services in home health setting to continue to advance safe functional mobility, address ongoing impairments in transfers, generalized strengthening and endurance, dynamic standing balance/coordination, gait training, and to minimize fall risk.  Equipment: Power [employer] - pt's daughter purchased [place] for home use   Reasons for discharge: treatment goals met and discharge from hospital  Patient/family agrees with progress made and goals achieved: Yes  PT Discharge Precautions/Restrictions Precautions Type of Shoulder Precautions: R shoulder pain and limited ROM Precaution Comments: History of falls at home Restrictions Weight Bearing Restrictions: No Pain Interference Pain Interference Pain Effect on Sleep: 2. Occasionally Pain Interference with Therapy Activities: 2. Occasionally Pain Interference with Day-to-Day Activities: 2. Occasionally Cognition Overall Cognitive Status: Within Functional Limits for tasks assessed Arousal/Alertness:  Awake/alert Orientation Level: Oriented X4 Memory: Appears intact Awareness: Appears intact Problem Solving: Appears intact Safety/Judgment: Appears intact Comments: fearful and anxious with movement Sensation Sensation Light Touch: Impaired Detail Peripheral sensation comments: Loss of sensation in BLEs, R-foot drop, L-foot neuropathy Light Touch Impaired Details: Impaired RLE;Impaired LLE Proprioception: Impaired Detail Proprioception Impaired Details: Impaired RLE;Impaired LLE Additional Comments: pt states has R foot drop Coordination Gross Motor Movements are Fluid and Coordinated: No Coordination and Movement Description: Heel to shin limited by strength Motor  Motor Motor: Abnormal postural alignment and control;Other (comment) Motor - Discharge Observations: generalized weakness, posterior bias in seated and standing positions, decreased ability to sustain muscle activation  Mobility Bed Mobility Bed Mobility: Rolling Right;Rolling Left;Supine to Sit;Sit to Supine Rolling Right: Independent with assistive device Rolling Left: Independent with assistive device Supine to Sit: Supervision/Verbal cueing Sit to Supine: Minimal Assistance - Patient > 75% Transfers Transfers: Sit to Stand;Stand to Sit;Stand Pivot Transfers Sit to Stand: Moderate Assistance - Patient 50-74% Stand to Sit: Minimal Assistance - Patient > 75% Stand Pivot Transfers: Moderate Assistance - Patient 50 - 74% Stand Pivot Transfer Details: Verbal cues for sequencing;Verbal cues for technique;Verbal cues for safe use of DME/AE;Manual facilitation for weight shifting Stand Pivot Transfer Details (indicate cue type and reason): inconsistant posterior lean in standing Transfer (Assistive device): Rolling walker Transfer via Lift Equipment: Stedy (CGA/supervision in stedy) Locomotion  Gait Ambulation: No Gait Gait: No Pick up small object from the floor assist level: Total Assistance - Patient <  25% Wheelchair Mobility Wheelchair Mobility: Yes Wheelchair Assistance: Chartered loss adjuster: Power Wheelchair Parts Management: Needs assistance (foot plates on power w/c) Distance: 500  Trunk/Postural Assessment  Cervical Assessment Cervical Assessment: Exceptions to [facility] (forward head) Thoracic Assessment Thoracic Assessment: Exceptions to [facility] (rounded shoulders) Lumbar Assessment Lumbar Assessment: Exceptions to [facility] (posterior pelvic tilt) Postural Control Postural Control: Deficits on evaluation Trunk Control:  mild L lean in sitting  Balance Balance Balance Assessed: Yes Static Sitting Balance Static Sitting - Balance Support: Feet supported;Bilateral upper extremity supported Static Sitting - Level of Assistance: 5: Stand by assistance Dynamic Sitting Balance Dynamic Sitting - Balance Support: During functional activity;Feet supported Dynamic Sitting - Level of Assistance: 5: Stand by assistance Static Standing Balance Static Standing - Balance Support: Bilateral upper extremity supported Static Standing - Level of Assistance: 4: Min assist Dynamic Standing Balance Dynamic Standing - Balance Support: During functional activity;Bilateral upper extremity supported Dynamic Standing - Level of Assistance: 3: Mod assist Extremity Assessment  RLE Assessment RLE Assessment: Exceptions to [facility] General Strength Comments: gross 3+/5 proximal and 4-/5 distal RLE AROM (degrees) Overall AROM Right Lower Extremity: Due to decreased strength LLE Assessment LLE Assessment: Exceptions to [facility] General Strength Comments: gross 4-/5 LLE AROM (degrees) Overall AROM Left Lower Extremity: Due to decreased strength   Alfonse Alpers PT, DPT  01/05/2023, 6:38 AM

## 2023-01-05 NOTE — Progress Notes (Signed)
Physical Therapy Session Note  Patient Details  Name: Terea Garnet MRN: [id number] Date of Birth: 06-24-1951  Today's Date: 01/05/2023 PT Individual Time: 1030-1110 PT Individual Time Calculation (min): 40 min   Short Term Goals: Week 4:  PT Short Term Goal 1 (Week 4): = to LTGs based on ELOS  Skilled Therapeutic Interventions/Progress Updates:    Chart reviewed and pt agreeable to therapy. Pt received seated in PWC with no c/o pain. Session focused on review of progress in preparation of d/c and sit to stand practice in STEDY for use with family. Pt initiated session with review of mobility, strength, sensation, and other progress to document discharge results. Pt then practiced stand in STEDY using supervision to stand. Pt then completed [place] navigation practice. Of note, pt benefited from training with clock method of pointing PWC in preparation of backing up. Thus, pt completed block practice of facing PWC in different positions via clock method. At end of session, pt was left seated in [facility] with nurse call bell and all needs in reach.     Therapy Documentation Precautions:  Precautions Precautions: Shoulder Type of Shoulder Precautions: R shoulder pain and limited ROM Precaution Comments: History of falls at home Restrictions Weight Bearing Restrictions: No    Therapy/Group: Individual Therapy  Marquette Old, PT, DPT 01/05/2023, 12:39 PM

## 2023-01-05 NOTE — Progress Notes (Signed)
PROGRESS NOTE   Subjective/Complaints: NO new complaints today. Family training this afternoon.  DC scheduled for tomorrow.  Hypoglycemic event this AM.   Review of Systems  Constitutional:  Negative for chills and fever.  Eyes:        Baseline blind R eye  Respiratory:  Negative for shortness of breath.   Cardiovascular:  Negative for chest pain.  Gastrointestinal:  Positive for diarrhea. Negative for abdominal pain, constipation, nausea and vomiting.  Genitourinary:  Positive for dysuria (improving). Negative for flank pain, frequency, hematuria and urgency.  Musculoskeletal:  Positive for joint pain.  Skin:  Negative for rash.  Neurological:  Positive for dizziness (transient episodes), sensory change and weakness. Negative for headaches.  Psychiatric/Behavioral:  The patient is nervous/anxious.     Objective:   No results found. Recent Labs    01/04/23 0532  WBC 4.9  HGB 14.6  HCT 45.3  PLT 168    Recent Labs    01/04/23 0532  NA 137  K 3.5  CL 97*  CO2 28  GLUCOSE 91  BUN 9  CREATININE 0.64  CALCIUM 9.8    Urinalysis    Component Value Date/Time   COLORURINE YELLOW 12/30/2022 1341   APPEARANCEUR CLOUDY (A) 12/30/2022 1341   LABSPEC 1.012 12/30/2022 1341   PHURINE 5.0 12/30/2022 1341   GLUCOSEU NEGATIVE 12/30/2022 1341   HGBUR MODERATE (A) 12/30/2022 1341   BILIRUBINUR NEGATIVE 12/30/2022 1341   KETONESUR 5 (A) 12/30/2022 1341   PROTEINUR 100 (A) 12/30/2022 1341   NITRITE POSITIVE (A) 12/30/2022 1341   LEUKOCYTESUR LARGE (A) 12/30/2022 1341   UCx 12/31/22: >100k CFU E.coli resistant to Ampicillin and TMP-SMX, 80k CFU Enterococcus faecalis     Shoulder Xray 11/30/22 Postsurgical changes are noted in the proximal right humerus with deformity identified. Underlying bony thorax appears within normal limits. No fracture or dislocation is seen. Stable bone island is noted in the mid humerus. No  new focal abnormality is seen.   IMPRESSION: Postsurgical  changes without acute abnormality.     Intake/Output Summary (Last 24 hours) at 01/05/2023 0820 Last data filed at 01/05/2023 0819 Gross per 24 hour  Intake 595 ml  Output --  Net 595 ml         Physical Exam: Vital Signs Blood pressure (!) 141/88, pulse 91, temperature 98.7 F (37.1 C), temperature source Oral, resp. rate 18, height 5' 7"$  (1.702 m), weight 82.5 kg, SpO2 97 %.  Physical Exam Constitutional: No distress . Vital signs reviewed. Sitting in chair- appears comfortable HEENT: NCAT, conjugate gaze, oral membranes moist Neck: supple Cardiovascular: IIR, normal rate, without murmur. No JVD    Respiratory/Chest: CTA Bilaterally without wheezes or rales. Normal effort    GI/Abdomen: BS +, non-tender, non-distended Ext: no clubbing, no cyanosis, peripheral pulses intact Psych: pleasant and cooperative  Skin: Trace BLE edema, chronic lower leg skin changes. No breakdown noted   Prior Exam Neurologic/MsK:   Sensory exam: revealed reduced sensation to light touch over right plantar foot and heel. Motor exam: Moving all 4 extremities to gravity R shoulder tenderness with palpation and Rom in all directions.  L knee joint line tenderness. Wearing knee brace  No abnormal tone noted      Strength:                RUE: 4/5 SA, 5-/5 EF, 5-/5 EE, 5/5 WE, 5/5 FF, 5/5 FA                 LUE: 5/5 SA, 5/5 EF, 5/5 EE, 5/5 WE, 5/5 FF, 5/5 FA                 RLE: 3+/5 HF, 4+/5 KE, 4/5 DF,  5/5 PF                 LLE:  3-/5 HF, 3+/5 KE, 5/5 DF,  5/5 PF  Coordination: Fine motor coordination was normal.      Assessment/Plan: 1. Functional deficits which require 3+ hours per day of interdisciplinary therapy in a comprehensive inpatient rehab setting. Physiatrist is providing close team supervision and 24 hour management of active medical problems listed below. Physiatrist and rehab team continue to assess barriers to  discharge/monitor patient progress toward functional and medical goals  Care Tool:  Bathing    Body parts bathed by patient: Right arm, Chest, Abdomen, Front perineal area, Right upper leg, Left upper leg, Face, Left arm, Buttocks, Left lower leg, Right lower leg   Body parts bathed by helper: Left arm, Buttocks, Right lower leg, Left lower leg     Bathing assist Assist Level: Contact Guard/Touching assist (shower on BSC)     Upper Body Dressing/Undressing Upper body dressing   What is the patient wearing?: Pull over shirt    Upper body assist Assist Level: Supervision/Verbal cueing    Lower Body Dressing/Undressing Lower body dressing      What is the patient wearing?: Incontinence brief, Pants     Lower body assist Assist for lower body dressing: Moderate Assistance - Patient 50 - 74% (in the stedy)     [place] for toileting: Moderate Assistance - Patient 50 - 74%     Transfers Chair/bed transfer  Transfers assist     Chair/bed transfer assist level: Dependent - mechanical lift     Locomotion Ambulation   Ambulation assist   Ambulation activity did not occur: Safety/medical concerns          Walk 10 feet activity   Assist  Walk 10 feet activity did not occur: Safety/medical concerns        Walk 50 feet activity   Assist Walk 50 feet with 2 turns activity did not occur: Safety/medical concerns         Walk 150 feet activity   Assist Walk 150 feet activity did not occur: Safety/medical concerns         Walk 10 feet on uneven surface  activity   Assist Walk 10 feet on uneven surfaces activity did not occur: Safety/medical concerns         Wheelchair     Assist Is the patient using a wheelchair?: Yes Type of Wheelchair: Manual    Wheelchair assist level: Dependent - Patient 0% Max wheelchair distance: 150    Wheelchair 50 feet with 2 turns activity    Assist         Assist Level: Dependent - Patient 0%   Wheelchair 150 feet activity     Assist      Assist Level: Dependent - Patient 0%   Blood pressure (!) 141/88, pulse 91, temperature 98.7 F (37.1 C), temperature source Oral, resp.  rate 18, height 5' 7"$  (1.702 m), weight 82.5 kg, SpO2 97 %.  Medical Problem List and Plan: 1. Functional deficits secondary to debility due to acute on chronic HFpEF, atrial fibrillation with RVR             -patient may shower             -ELOS/Goals: 2/21, min A/mod A for gait at least 10-15 feet  -Continue CIR, PT/OT -Power WC eval today-- wants to know what she will go home with since her w/c won't be delivered for 4-5 weeks -Should get rental WC- pt to call and ask if it can be brought in the am, will check with therapy -DC tomorrow    2.  Antithrombotics: -DVT/anticoagulation:  Lovenox 40 mg daily (No full-dose AC d/t Hx SAH 2007)             -antiplatelet therapy: ASA 325 daily per IM until OP cards f/u 3. Pain Management:  Oxycodone or tramadol prn. Kpad ordered -encourage use of Voltaren gel 2g QID for OA right shoulder/left knee.  4. Mood/Behavior/Sleep: LCSW to follow for evaluation and support.              -Melatonin prn--12/05/22 ordered as scheduled 2m QHS, Trazodone PRN   -12/06/22 sleeping improving, continue current regimen -12/12/22 wants melatonin to be changed to PRN, ordered this; advised she will need to request sleep meds if she needs them             -antipsychotic agents: N/A  -Nortriptyline 130mQHS, Neurontin 10066mHS 5. Neuropsych/cognition: This patient is capable of making decisions on her own behalf.  -start xanax 0.25 BID PRN for anxiety -Consulted Psychiatry- psychiatry reviewed her case and case discussed with  TakSheran Favasych recommended continuation current medications encourage therapy and did not feel this issue would benefit from inpatient psych, ok to cancel consult.   -1/31 start low dose duloxetine  81m28miscussed risks and benefits of this medication,may also help with joint pain -2/2 increase duloxetine to 30mg49mly -2/7 increased duloxetine to 60mg 61m2/13 anxiety a little improved per patient, continue duloxetine  6. Skin/Wound Care: Add lotion to BLE and hands             -routine pressure relief measures.  7. Fluids/Electrolytes/Nutrition: Monitor I/O. Intake improving. Monitor weekly BMPs, next 01/04/23 -May need to stay dry to avoid fluid overload. 8. Acute on chronic diastolic CHF: Strict I/O w/daily weights. -Cardiac diet. Monitor for signs of overload.  -continue Lasix 40mg Q69mldactone 12.5mg QD,42mtoprolol 100mg QD,69mvastatin 40mg QD a23mosartan 50mg QD -m18mor for recurrent hyperkalemia (Losartan dose decreased 01/14) -2/1 A little up from a few days ago but appears stable overall -2/20 Stable wt, monitor  Filed Weights   01/03/23 0454 01/04/23 0516 01/05/23 0500  Weight: 84.7 kg 84.7 kg 82.5 kg     9. A Fib w/RVR: Monitor HR TID--continue Cardizem 180mg QD and73moprolol 100mg QD  10.42m: BPs with some lability, 130s-160s SBP -12/05/22 monitor for now, Losartan decreased 11/29/22, could consider going back to home dosing if appropriate -1/23 BP has been elevated, will increase losartan to 50mg  -2/13 w69mcontrolled overall, decrease losartan dose to 25mg due to K+29mvated -2/20 controlled overall, continue current meds  Vitals:   01/01/23 1337 01/01/23 1937 01/02/23 0239 01/02/23 1329  BP: 116/62 126/79 120/66 (!) 133/57   01/02/23 1409 01/02/23 1940 01/03/23 0257 01/03/23 1326  BP: 124/60 (!)Marland Kitchen  133/56 127/68 (!) 161/85   01/03/23 2039 01/04/23 0254 01/04/23 1303 01/04/23 2039  BP: 133/83 133/67 139/80 (!) 141/88     11.  T1DM: Hgb A1c- 7.4. Monitor BS ac/hs and use SSI for elevated BS             -continue insulin glargline 15 units daily.  -May need to add meal coverage-->used 5- 7units tid prn PTA.  -12/05/22 increase Semglee to 16U QD -1/24 add 2  units mealtime insulin -1/25 increase mealtime to 4 units insulin, decrease lantus to 14 units -1/26 decrease mealtime insulin to 3 units due to hypoglycemia, semglee to AM -1/31 trending a little lower, decrease mealtime insulin dose to 2 units -2/1 decrease semglee to 11 units from 14units, recommend nighttime snack -2/2 decreased to moderate resistance correctional SSI -12/20/22 increase mealtime novolog to 3U again and leave Semglee as is -12/21/22 increase semglee to 13u -2/9 will adjust semglee to 16units, stop mealtime- per pharm recs -2/12 CBG's with a big drop today after being in the 200's-->No recorded intake after breakfast 2/11. -2/13 Decrease semglee to 13 units due to hypoglycemia, I suspect she does not eat consistent carbs with each meal  -2/14 HS insulin and  SSI insulin scale was decreased, will consult DM coordinator -2/15 Novolog 3 units TID restarted per DM coordinator recs, appreciate assistance -2/16 hypoglycemia this AM, decrease lantus to 10units,  decrease mealtime insulin to 2 units for now, glucose control may improve as UTI is treated -01/02/23 CBGs similar in pattern, cont regimen for now, monitor for changes -2/20 decrease glargine to 9 units due to low CBG in AM  CBG (last 3)  Recent Labs    01/05/23 0621 01/05/23 0705 01/05/23 0752  GLUCAP 65* 66* 97      12. Chronic bilateral shoulder and left knee pain: Oxycodone prn -Refusing voltaren gel for local measures-->encourage use             -Aquathermia for local measures.              -L knee: Follows OP ortho, no benefit from injections in the past  -L knee pain improved with knee brace  13. Upper respiratory infection, Nocturnal hypoxia: Question sleep apnea.  -Encourage pulmonary hygiene -Respiratory viral panel negative x2 inpatient -Remains with cough, intermittent SOB - add PRN inhaler, monitor  14.  Pre-renal azotemia: Likely due to diuresis. BUN up but SCr improving.   -Continue to monitor.   -2/15 BUN and Cr stable, recheck Monday 01/04/23 stable  15. Overactive bladder, incontinence: hx of, on Myrbetriq 44m QD; now off purewick -12/05/22 having increased frequency and incontinence, ordered U/A, UCx, and scheduled voiding program -12/06/22 U/A c/w UTI, see #16 -Continue myrbetriq,she is mostly continent with occasional incontinent episodes -12/28/22 continues to have improvement, cont regimen  16. UTI 12/06/22:  -U/A c/w UTI, ordered Macrobid 1055mBID x5 days (12/06/22>>12/10/22 last dose), UCx pending- Ecoli sensitive to macrobid, completed course, symptoms resolved -2/7 cloudy urine yesterday, asymptomatic, continue to monitor -2.14 having intermittant burning with urination, check U/A and culture -2/15 keflex started for UTI 7 day, follow urine culture -2/16 E coli 100,000 and E faecalis 80,000 CFU. E coli sensitive to cefazolin, symptoms improved, continue keflex -01/02/23 Keflex fell off after 7 doses (last dose 01/01/23 AM, total of 2 full days worth of Keflex given)-- reordered, but Pharmacy states Keflex won't cover E.faecalis so switched to Macrobid 10041mID x5 days (end 01/06/23 PM)  17.  Hypokalemia  -30m38m2,  recheck tomorrow  -K+ up to 3.6, losartan increased today, Recheck tomorrow -1/25 K+ now elevated hyperkalemia at 5.4, lokelma 5g  x1, stop potassium supplementation, discussed with pharmacy, recheck tomorrow -2/12--5.3 no hemolysis reported on bmet. No scheduled potassium today. Give small dose of lokelma today. Also on losartan and aldactone---hold aldactone tomorrow pending repeat bmet. Consider stopping losartan too if bp's are controlled.  -2/13 K+ down to 3.9, decrease losartan dose to 35m -2/19 stable at 3.5  18. Dizziness -1/22 Will start meclizine 12.590mPRN, check orthostatic VS -She has HX of vertigo going back to 2013, seen by Dr. [facility]elyn [facility], felt to be related to vestibulopathy -1/30 fall yesterday but no injury -2/2 increase meclizine to 2534mTID prn -12/28/22 --did not report dizziness. Continue meclizine prn -01/02/23 meclizine helping mostly, not causing excessive drowsiness, continue regimen  19. Constipation/Diarrhea: has chronic constipation, now with diarrhea 01/03/23  -Schedule miralax QD, PRN dulcolax supp and fleet enema -1/26 had vomiting with  sorbitol yesterday, continue miralax daily, she had large BM yesterday -1/29 miralax increased to BID -1/30 she declines stronger medication at this time,order sorbitol PRN -2/1 large bm yesterday, improved  -12/19/22 now having increased stool frequency, wants to change Miralax to BID PRN rather than scheduled; also wants Colace 100m18mD PRN in case she needs it; knows to ask -12/28/22 multiple stools over last 24 hours--on no scheduled bowel meds -2/14 LBM today, liquid, does not appear to be on laxatives scheduled, continue monitor -01/02/23 BMs improving, cont to monitor -01/03/23 multiple loose BMs yesterday, ?food related vs abx-- slowing down this morning. Ordered imodium 2mg 29m PRN, monitor closely 2/20 LBM yesterday-loose, NO BM today yet, continue to monitor  20. R shoulder pain  -Xray on 1/15 with postsurgical changes, continue voltaren gel, Kpad  -1/31 Duloxetine started as this may help with joint pain also -2/2 Duloxetine increase to 30mg 40m increased duloxetine to 60mg  42m: 32 days A FACE TO FACE EVALUATION WAS PERFORMED  Camri Molloy ShJennye Boroughs024, 8:20 AM

## 2023-01-05 NOTE — Progress Notes (Signed)
Patient ID: [name], female   DOB: [birth year], 72 y.o.   MRN: [id number]  met with pt, daughter and husband who ate here for education in preparation for discharge tomorrow. All report it has gone well and with the stedy they feel they can manage at home. Did do car transfer and feel can do this also tomorrow as long as help getting into car can get out of car once home. Discussed follow up and they will reach out to set up follow up appointments. All feel comfortable with discharge tomorrow.

## 2023-01-05 NOTE — Progress Notes (Addendum)
Inpatient Rehabilitation Care Coordinator Discharge Note   Patient Details  Name: [name] MRN: [id number] Date of Birth: 04/25/1951   Discharge location: HOME WITH HUSBAND AND DAUGHTER WHO WORKS DURING THE DAY BUT CAN ASSIST IN THE EVENINGS  Length of Stay: 33 days  Discharge activity level: MIN ASSIST [place]  Home/community participation: ACTIVE  Patient response [id number] Literacy - How often do you need to have someone help you when you read instructions, pamphlets, or other written material from your doctor or pharmacy?: Always  Patient response [id number] Isolation - How often do you feel lonely or isolated from those around you?: Never  Services provided included: RD, MD, PT, OT, RN, CM, TR, Pharmacy, SW  Financial Services:  Charity fundraiser Utilized: Regulatory affairs officer MEDICARE  Choices offered to/list presented to: PT AND DAUGHTER  Follow-up services arranged:  Home Health, Patient/Family request agency HH/DME, DME Home Health Agency: [facility]    DME : [facility] MOTION-POWER CHAIR  GOT STEDY ON OWN HH/DME Requested Agency: PREF HAD BEFORE  HAS PRIVATE DUTY LIST IF NEEDED REFERRAL MADE FOR MEALS FOR MOM [place] 2/23  Patient response to transportation need: Is the patient able to respond to transportation needs?: Yes In the past 12 months, has lack of transportation kept you from medical appointments or from getting medications?: No In the past 12 months, has lack of transportation kept you from meetings, work, or from getting things needed for daily living?: No    Comments (or additional information):HUSBAND AND DAUGHTER WERE HERE FOR HANDS ON [name].  Patient/Family verbalized understanding of follow-up arrangements:  Yes  Individual responsible for coordination of the follow-up plan: SELF & Levon Hedger [id number] OR JNEEIFER-DAUGHTER  [id number]  Confirmed correct DME delivered: Elease Hashimoto 01/05/2023    Aneka Fagerstrom, Gardiner Rhyme

## 2023-01-05 NOTE — Progress Notes (Signed)
Occupational Therapy Discharge Summary  Patient Details  Name: [name] MRN: [id number] Date of Birth: September 22, 1951  Date of Discharge from OT service:January 05, 2023   Patient has met 7 of 8 long term goals due to improved activity tolerance, improved balance, postural control, ability to compensate for deficits, functional use of  RIGHT upper extremity, and improved coordination.  Patient to discharge at [facility] Assist level.  Patient's care partner is independent to provide the necessary physical assistance at discharge. [name] has made excellent progress, using the stedy for transfers at a CGA level and standing with the RW at a relatively consistent min A level, occasionally able to complete stand pivots with as little as min A, sometimes as much as mod-max A. Family education has been completed with her daughter and husband.   Reasons goals not met: Pt is inconsistent in dynamic standing and will continue to address this with HHOT.   Recommendation:  Patient will benefit from ongoing skilled OT services in home health setting to continue to advance functional skills in the area of BADL and iADL.  Equipment: BSC, stedy, power w/c   Reasons for discharge: treatment goals met and discharge from hospital  Patient/family agrees with progress made and goals achieved: Yes  OT Discharge Precautions/Restrictions  Precautions Precautions: Shoulder Type of Shoulder Precautions: R shoulder pain and limited ROM Precaution Comments: History of falls at home Restrictions Weight Bearing Restrictions: No Other Position/Activity Restrictions: painful R shoulder with limited ROM   ADL ADL Eating: Independent Where Assessed-Eating: Bed level Grooming: Modified independent Where Assessed-Grooming: Sitting at sink Upper Body Bathing: Modified independent Where Assessed-Upper Body Bathing: Shower Lower Body Bathing: Supervision/safety Where Assessed-Lower Body Bathing: Shower Upper  Body Dressing: Supervision/safety Where Assessed-Upper Body Dressing: Wheelchair Lower Body Dressing: Contact guard Where Assessed-Lower Body Dressing: Wheelchair Toileting: Minimal assistance Where Assessed-Toileting: Medical laboratory scientific officer: Contact guard (stedy) Toilet Transfer Method:  (stedy) Walk-In Shower Transfer: Contact guard (stedy) ADL Comments: Pt requiring increased time to perform STS transfer using stedy and +2 assistance, unable to tolerate >1 min of standing. Vision Baseline Vision/History: 2 Legally blind;1 Wears glasses Patient Visual Report: No change from baseline Vision Assessment?: No apparent visual deficits Perception  Perception: Within Functional Limits Praxis Praxis: Intact Cognition Cognition Overall Cognitive Status: Within Functional Limits for tasks assessed Arousal/Alertness: Awake/alert Orientation Level: Person;Place;Situation Person: Oriented Place: Oriented Situation: Oriented Memory: Appears intact Attention: Selective Awareness: Appears intact Problem Solving: Appears intact Safety/Judgment: Appears intact Brief Interview for Mental Status (BIMS) Repetition of Three Words (First Attempt): 3 Temporal Orientation: Year: Correct Temporal Orientation: Month: Accurate within 5 days Temporal Orientation: Day: Correct Recall: "Sock": Yes, no cue required Recall: "Blue": Yes, no cue required Recall: "Bed": Yes, no cue required BIMS Summary Score: 15 Sensation Sensation Light Touch: Impaired Detail Peripheral sensation comments: Loss of sensation in BLEs, R-foot drop, L-foot neuropathy Light Touch Impaired Details: Impaired RLE;Impaired LLE Proprioception: Impaired Detail Proprioception Impaired Details: Impaired RLE;Impaired LLE Coordination Gross Motor Movements are Fluid and Coordinated: No Fine Motor Movements are Fluid and Coordinated: Yes Coordination and Movement Description: limited by B knee OA Motor  Motor Motor:  Abnormal postural alignment and control;Other (comment) Motor - Discharge Observations: generalized weakness, posterior bias in seated and standing positions, decreased ability to sustain muscle activation Mobility  Bed Mobility Bed Mobility: Rolling Right;Rolling Left;Supine to Sit;Sit to Supine Rolling Right: Independent with assistive device Rolling Left: Independent with assistive device Supine to Sit: Supervision/Verbal cueing Transfers Sit to Stand: Moderate Assistance - Patient  50-74% Stand to Sit: Minimal Assistance - Patient > 75%  Trunk/Postural Assessment  Cervical Assessment Cervical Assessment: Within Functional Limits Thoracic Assessment Thoracic Assessment: Exceptions to WFL (rounded shoulders) Postural Control Postural Control: Deficits on evaluation  Balance Balance Balance Assessed: Yes Static Sitting Balance Static Sitting - Balance Support: Feet supported;Bilateral upper extremity supported Static Sitting - Level of Assistance: 5: Stand by assistance Dynamic Sitting Balance Dynamic Sitting - Balance Support: During functional activity;Feet supported Dynamic Sitting - Level of Assistance: 5: Stand by assistance Static Standing Balance Static Standing - Balance Support: Bilateral upper extremity supported Static Standing - Level of Assistance: 4: Min assist Dynamic Standing Balance Dynamic Standing - Balance Support: During functional activity;Bilateral upper extremity supported Dynamic Standing - Level of Assistance: 3: Mod assist;2: Max assist Extremity/Trunk Assessment RUE Assessment RUE Assessment: Exceptions to [facility] Active Range of Motion (AROM) Comments: 40 degrees actively shoulder abduction and adduction, painful with PROM and all movement. Baseline RUE AROM (degrees) Overall AROM Right Upper Extremity: Deficits;Due to pain RUE PROM (degrees) Overall PROM Right Upper Extremity: Deficits;Due to pain LUE Assessment LUE Assessment: Within Functional  Limits   Curtis Sites 01/05/2023, 3:44 PM

## 2023-01-05 NOTE — Progress Notes (Signed)
Physical Therapy Session Note  Patient Details  Name: [name] MRN: [id number] Date of Birth: 1951/06/06  Today's Date: 01/05/2023 PT Individual Time: [id number] PT Individual Time Calculation (min): 54 min   Short Term Goals: Week 3:  PT Short Term Goal 1 (Week 3): Pt will perform supine<>sit with minA consistently. PT Short Term Goal 1 - Progress (Week 3): Met PT Short Term Goal 2 (Week 3): Pt will performs sit<>stand using LRAD with modA consistently. (from ~20in height) PT Short Term Goal 2 - Progress (Week 3): Met (from ~20in height seat surface) PT Short Term Goal 3 (Week 3): Pt will perform bed<>chair transfer consistently with modA PT Short Term Goal 3 - Progress (Week 3): Progressing toward goal PT Short Term Goal 4 (Week 3): Pt will ambulate at least 10' with modA +1 and LRAD. PT Short Term Goal 4 - Progress (Week 3): Not met Week 4:  PT Short Term Goal 1 (Week 4): = to LTGs based on ELOS  Skilled Therapeutic Interventions/Progress Updates:   Received pt sitting in power WC with family present for family education training. Pt agreeable to PT treatment and denied any pain during session. Session with emphasis on discharge planning, functional mobility/transfers, generalized strengthening and endurance, dynamic standing balance/coordination, and simulated car transfers. MD present briefly during session then pt propelled power WC 150' to gym at start of session with slow safe speed to navigate busy hallway environment, no cues needed to negotiate tight turns in doorway. Sit<>stand transfer completed with Stedy, pt completed at Mod I level for power up with safe hand placement and able to control lower with safe reach back. Car transfer simulated with mat table to allow use of Stedy to simulate home car transfer. Pt's spouse provided assistance with supervision and verbal cuing from therapist for positioning to assist with Stedy paddles and and for lock management. Pt demonstrates  excellent insight and ability to cue family for safe Stedy management and use of gait belt to assist with controlled lower to WC. Spouse assisted with WC management of foot rest. Therapist reviewed functions of power WC to reposition LE's and back (tilt, recline, leg elevation, seat elevator). Patient demonstrated good intuition for controls that had not been tested yet with min cuing from therapist.   Discussed bed set up at home: family reports bed is adjustable for Head/Foot of bed, has bed rail on Rt, not adjustable for height (height is 24"). Pt's spouse completed WC<>bed transfer with Stedy. No cues provided from therapist for Valentine. Pt able to direct step-by-step sequence for safe use fo Stedy with transfer up from [facility] and down to bed. Pt's spouse provided assistance for sit<>supine with Mod Assist for LE management. Pt able to complete supine>sit at supervision level from flat bed with use of bed rail and extra time. Pt's daughter assisted with sit<>supine transfer and reported Min assistance to bring Bil LE's onto bed.  Completed sit<>stand from EOB with RW with Mod assist and extra time in standing to weight shift anterior for upright posture. Pt able to take small forward and side steps with RW and Min assist from therapist for stand pivot transfer bed>WC. Pt able to reposition hips in [facility] for neutral/midline position in sitting with supervision. Discussed strategies for energy conservation and use of pulse oximeter to monitor HR, SpO2 to assess activity tolerance. Educated on pursed lip breathing to help with recovery from activity and for anxiety management. Concluded session with pt sitting in power WC with all needs  within reach.   Therapy Documentation Precautions:  Precautions Precautions: Shoulder Type of Shoulder Precautions: R shoulder pain and limited ROM Precaution Comments: History of falls at home Restrictions Weight Bearing Restrictions: No  Therapy/Group: Individual  Therapy Alfonse Alpers PT, DPT  01/05/2023, 6:41 AM

## 2023-01-05 NOTE — Progress Notes (Signed)
Occupational Therapy Session Note  Patient Details  Name: Kwame Carro MRN: [id number] Date of Birth: June 24, 1951  Today's Date: 01/05/2023 OT Individual Time: 1400-1455 OT Individual Time Calculation (min): 55 min    Short Term Goals: Week 4:  OT Short Term Goal 1 (Week 4): STG= LTG d/t ELOS  Skilled Therapeutic Interventions/Progress Updates:   Family training focus this session in prep for discharge planning tomorrow. Pt's husband and dtr present for training. Pt up in [facility] and eager and open to all education. OT began session with instruction both verbal and demo with teach back for PWC features for elevation, tilt, recline and B foot foot plates with family demo indep. OT trained family in allowing set up and strategies for pt to work on standing from chair for oral care and other needs at sink and kitchen counters. Pt able to demonstrate with S with strong focus of trng in use of gait belt and set up. Both family members able to teach back. OT then set commode next to bed and pt's dtr and husband able to demonstrate understanding and teach back via hands on for use of STEDY for transfer PWC on and off commode. Once completed, pt requested hand strength exercises and OT issued and trained in foam cube squeezes for home carryover. MSW arrived and all parties reported feeling educated with no further OT questions or needs at CIR. Rec HHOT after d/c. Left pt with MSW and family bedside.   Therapy Documentation Precautions:  Precautions Precautions: Shoulder Type of Shoulder Precautions: R shoulder pain and limited ROM Precaution Comments: History of falls at home Restrictions Weight Bearing Restrictions: No    Therapy/Group: Individual Therapy  Barnabas Lister 01/05/2023, 8:45 AM

## 2023-01-05 NOTE — Plan of Care (Signed)
  Problem: RH Car Transfers Goal: LTG Patient will perform car transfers with assist (PT) Description: LTG: Patient will perform car transfers with assistance (PT). Outcome: Not Applicable Flowsheets (Taken 01/05/2023 1221) LTG: Pt will perform car transfers with assist:: (D/C) -- Note: D/C   Problem: RH Ambulation Goal: LTG Patient will ambulate in home environment (PT) Description: LTG: Patient will ambulate in home environment, # of feet with assistance (PT). Outcome: Not Applicable Flowsheets (Taken 01/05/2023 1221) LTG: Pt will ambulate in home environ  assist needed:: (D/C) -- Note: D/C   Problem: RH Stairs Goal: LTG Patient will ambulate up and down stairs w/assist (PT) Description: LTG: Patient will ambulate up and down # of stairs with assistance (PT) Outcome: Not Applicable Flowsheets (Taken 01/05/2023 1221) LTG: Pt will ambulate up/down stairs assist needed:: (D/C) -- Note: D/C

## 2023-01-06 ENCOUNTER — Other Ambulatory Visit (HOSPITAL_COMMUNITY): Payer: Self-pay

## 2023-01-06 LAB — GLUCOSE, CAPILLARY: Glucose-Capillary: 99 mg/dL (ref 70–99)

## 2023-01-06 MED ORDER — PEN NEEDLES 31G X 8 MM MISC
1.0000 | Freq: Every day | 0 refills | Status: AC
  Filled 2023-01-06: qty 100, 30d supply, fill #0

## 2023-01-06 NOTE — Progress Notes (Signed)
Inpatient Rehabilitation Discharge Medication Review by a Pharmacist  A complete drug regimen review was completed for this patient to identify any potential clinically significant medication issues.  High Risk Drug Classes Is patient taking? Indication by Medication  Antipsychotic Yes Compazine - prn N/V  Anticoagulant No   Antibiotic Yes Po nitrofurantoin - UTI   Opioid Yes Tramadol, oxycodone -  prn pain  Antiplatelet No   Hypoglycemics/insulin Yes Insulin - DM  Vasoactive Medication Yes Diltiazem, furosemide, losartan, metoprolol, spironolactone - BP  Chemotherapy No   Other Yes Alendronate - osteoporosis Gabapentin - neuropathy Latanoprost - glaucoma  Myrbetriq - incontinence   Nortriptyline, duloxetine, prn alprazolam - pain, mood, anxiety  Pravastatin - HLD Trazodone - prn sleep     Type of Medication Issue Identified Description of Issue Recommendation(s)  Drug Interaction(s) (clinically significant)     Duplicate Therapy     Allergy     No Medication Administration End Date     Incorrect Dose     Additional Drug Therapy Needed     Significant med changes from prior encounter (inform family/care partners about these prior to discharge).    Other       Clinically significant medication issues were identified that warrant physician communication and completion of prescribed/recommended actions by midnight of the next day:  No  Pharmacist comments: None  Time spent performing this drug regimen review (minutes):  20 minutes  Thank you Anette Guarneri, PharmD

## 2023-01-06 NOTE — Progress Notes (Signed)
PROGRESS NOTE   Subjective/Complaints: DC home today.  No additional questions or concerns. Reports her WC has been delivered  Review of Systems  Constitutional:  Negative for chills and fever.  HENT:  Negative for congestion.   Eyes:        Baseline blind R eye  Respiratory:  Negative for shortness of breath.   Cardiovascular:  Negative for chest pain.  Gastrointestinal:  Positive for diarrhea. Negative for abdominal pain, constipation, nausea and vomiting.  Genitourinary:  Positive for dysuria (improving). Negative for flank pain, frequency, hematuria and urgency.  Musculoskeletal:  Positive for joint pain.  Skin:  Negative for rash.  Neurological:  Positive for dizziness (transient episodes), sensory change and weakness. Negative for headaches.  Psychiatric/Behavioral:  The patient is nervous/anxious.     Objective:   No results found. Recent Labs    01/04/23 0532  WBC 4.9  HGB 14.6  HCT 45.3  PLT 168    Recent Labs    01/04/23 0532  NA 137  K 3.5  CL 97*  CO2 28  GLUCOSE 91  BUN 9  CREATININE 0.64  CALCIUM 9.8    Urinalysis    Component Value Date/Time   COLORURINE YELLOW 12/30/2022 1341   APPEARANCEUR CLOUDY (A) 12/30/2022 1341   LABSPEC 1.012 12/30/2022 1341   PHURINE 5.0 12/30/2022 1341   GLUCOSEU NEGATIVE 12/30/2022 1341   HGBUR MODERATE (A) 12/30/2022 1341   BILIRUBINUR NEGATIVE 12/30/2022 1341   KETONESUR 5 (A) 12/30/2022 1341   PROTEINUR 100 (A) 12/30/2022 1341   NITRITE POSITIVE (A) 12/30/2022 1341   LEUKOCYTESUR LARGE (A) 12/30/2022 1341   UCx 12/31/22: >100k CFU E.coli resistant to Ampicillin and TMP-SMX, 80k CFU Enterococcus faecalis     Shoulder Xray 11/30/22 Postsurgical changes are noted in the proximal right humerus with deformity identified. Underlying bony thorax appears within normal limits. No fracture or dislocation is seen. Stable bone island is noted in the mid  humerus. No new focal abnormality is seen.   IMPRESSION: Postsurgical  changes without acute abnormality.     Intake/Output Summary (Last 24 hours) at 01/06/2023 0846 Last data filed at 01/06/2023 0814 Gross per 24 hour  Intake 476 ml  Output --  Net 476 ml         Physical Exam: Vital Signs Blood pressure (!) 142/72, pulse 88, temperature 97.9 F (36.6 C), temperature source Oral, resp. rate 18, height 5' 7"$  (1.702 m), weight 84.8 kg, SpO2 96 %.  Physical Exam Constitutional: No distress . Vital signs reviewed. Sitting in chair- appears comfortable HEENT: NCAT, conjugate gaze, oral membranes moist Neck: supple Cardiovascular: IIR, normal rate, without murmur. No JVD    Respiratory/Chest: CTA Bilaterally without wheezes or rales. Normal effort . Good air movement GI/Abdomen: BS +, non-tender, non-distended Ext: no clubbing, no cyanosis, peripheral pulses intact Psych: pleasant and cooperative  Skin: Trace BLE edema, chronic lower leg skin changes. No breakdown noted   Prior Exam Neurologic/MsK:   Sensory exam: revealed reduced sensation to light touch over right plantar foot and heel. Motor exam: Moving all 4 extremities to gravity R shoulder tenderness with palpation and Rom in all directions.  L knee joint line  tenderness. Wearing knee brace No abnormal tone noted      Strength:                RUE: 4/5 SA, 5-/5 EF, 5-/5 EE, 5/5 WE, 5/5 FF, 5/5 FA                 LUE: 5/5 SA, 5/5 EF, 5/5 EE, 5/5 WE, 5/5 FF, 5/5 FA                 RLE: 3+/5 HF, 4+/5 KE, 4/5 DF,  5/5 PF                 LLE:  3-/5 HF, 3+/5 KE, 5/5 DF,  5/5 PF  Coordination: Fine motor coordination was normal.      Assessment/Plan: 1. Functional deficits which require 3+ hours per day of interdisciplinary therapy in a comprehensive inpatient rehab setting. Physiatrist is providing close team supervision and 24 hour management of active medical problems listed below. Physiatrist and rehab team continue  to assess barriers to discharge/monitor patient progress toward functional and medical goals  Care Tool:  Bathing    Body parts bathed by patient: Right arm, Chest, Abdomen, Front perineal area, Right upper leg, Left upper leg, Face, Left arm, Buttocks, Left lower leg, Right lower leg   Body parts bathed by helper: Left arm, Buttocks, Right lower leg, Left lower leg     Bathing assist Assist Level: Supervision/Verbal cueing     Upper Body Dressing/Undressing Upper body dressing   What is the patient wearing?: Pull over shirt    Upper body assist Assist Level: Supervision/Verbal cueing    Lower Body Dressing/Undressing Lower body dressing      What is the patient wearing?: Incontinence brief, Pants     Lower body assist Assist for lower body dressing: Contact Guard/Touching assist (in the stedy) Assistive Device Comment: reacher   Toileting Toileting    Toileting assist Assist for toileting: Contact Guard/Touching assist     Transfers Chair/bed transfer  Transfers assist     Chair/bed transfer assist level: Moderate Assistance - Patient 50 - 74%     Locomotion Ambulation   Ambulation assist   Ambulation activity did not occur: Safety/medical concerns (weakness, fatigue, fear)          Walk 10 feet activity   Assist  Walk 10 feet activity did not occur: Safety/medical concerns (weakness, fatigue, fear)        Walk 50 feet activity   Assist Walk 50 feet with 2 turns activity did not occur: Safety/medical concerns (weakness, fatigue, fear)         Walk 150 feet activity   Assist Walk 150 feet activity did not occur: Safety/medical concerns (weakness, fatigue, fear)         Walk 10 feet on uneven surface  activity   Assist Walk 10 feet on uneven surfaces activity did not occur: Safety/medical concerns (weakness, fatigue, fear)         Wheelchair     Assist Is the patient using a wheelchair?: Yes Type of Wheelchair: Power     Wheelchair assist level: Supervision/Verbal cueing Max wheelchair distance: 500    Wheelchair 50 feet with 2 turns activity    Assist        Assist Level: Supervision/Verbal cueing   Wheelchair 150 feet activity     Assist      Assist Level: Supervision/Verbal cueing   Blood pressure (!) 142/72, pulse 88, temperature 97.9 F (  36.6 C), temperature source Oral, resp. rate 18, height 5' 7"$  (1.702 m), weight 84.8 kg, SpO2 96 %.  Medical Problem List and Plan: 1. Functional deficits secondary to debility due to acute on chronic HFpEF, atrial fibrillation with RVR             -patient may shower             -ELOS/Goals: 2/21, min A/mod A for gait at least 10-15 feet  -Continue CIR, PT/OT -Power WC eval today-- wants to know what she will go home with since her w/c won't be delivered for 4-5 weeks -Should get rental WC- pt to call and ask if it can be brought in the am, will check with therapy -DC today -She reports WC has been delivered    2.  Antithrombotics: -DVT/anticoagulation:  Lovenox 40 mg daily (No full-dose AC d/t Hx SAH 2007)             -antiplatelet therapy: ASA 325 daily per IM until OP cards f/u 3. Pain Management:  Oxycodone or tramadol prn. Kpad ordered -encourage use of Voltaren gel 2g QID for OA right shoulder/left knee.  4. Mood/Behavior/Sleep: LCSW to follow for evaluation and support.              -Melatonin prn--12/05/22 ordered as scheduled 5m QHS, Trazodone PRN   -12/06/22 sleeping improving, continue current regimen -12/12/22 wants melatonin to be changed to PRN, ordered this; advised she will need to request sleep meds if she needs them             -antipsychotic agents: N/A  -Nortriptyline 177mQHS, Neurontin 10049mHS 5. Neuropsych/cognition: This patient is capable of making decisions on her own behalf.  -start xanax 0.25 BID PRN for anxiety -Consulted Psychiatry- psychiatry reviewed her case and case discussed with  TakSheran Favapsych recommended continuation current medications encourage therapy and did not feel this issue would benefit from inpatient psych, ok to cancel consult.   -1/31 start low dose duloxetine 75m70miscussed risks and benefits of this medication,may also help with joint pain -2/2 increase duloxetine to 30mg18mly -2/7 increased duloxetine to 60mg 3m2/13 anxiety a little improved per patient, continue duloxetine  6. Skin/Wound Care: Add lotion to BLE and hands             -routine pressure relief measures.  7. Fluids/Electrolytes/Nutrition: Monitor I/O. Intake improving. Monitor weekly BMPs, next 01/04/23 -May need to stay dry to avoid fluid overload. 8. Acute on chronic diastolic CHF: Strict I/O w/daily weights. -Cardiac diet. Monitor for signs of overload.  -continue Lasix 40mg Q61mldactone 12.5mg QD,48mtoprolol 100mg QD,72mvastatin 40mg QD a67mosartan 50mg QD -m27mor for recurrent hyperkalemia (Losartan dose decreased 01/14) -2/1 A little up from a few days ago but appears stable overall -2/21 stable  Filed Weights   01/04/23 0516 01/05/23 0500 01/06/23 0500  Weight: 84.7 kg 82.5 kg 84.8 kg     9. A Fib w/RVR: Monitor HR TID--continue Cardizem 180mg QD and19moprolol 100mg QD  10.54m: BPs with some lability, 130s-160s SBP -12/05/22 monitor for now, Losartan decreased 11/29/22, could consider going back to home dosing if appropriate -1/23 BP has been elevated, will increase losartan to 50mg  -2/13 w61mcontrolled overall, decrease losartan dose to 25mg due to K+23mvated -2/21 controlled  Vitals:   01/02/23 1329 01/02/23 1409 01/02/23 1940 01/03/23 0257  BP: (!) 133/57 124/60 (!) 133/56 127/68   01/03/23 1326 01/03/23 2039 01/04/23  0254 01/04/23 1303  BP: (!) 161/85 133/83 133/67 139/80   01/04/23 2039 01/05/23 1304 01/05/23 2014 01/06/23 0428  BP: (!) 141/88 (!) 131/58 (!) 143/76 (!) 142/72     11.  T1DM: Hgb A1c- 7.4. Monitor BS ac/hs and use SSI for elevated BS              -continue insulin glargline 15 units daily.  -May need to add meal coverage-->used 5- 7units tid prn PTA.  -12/05/22 increase Semglee to 16U QD -1/24 add 2 units mealtime insulin -1/25 increase mealtime to 4 units insulin, decrease lantus to 14 units -1/26 decrease mealtime insulin to 3 units due to hypoglycemia, semglee to AM -1/31 trending a little lower, decrease mealtime insulin dose to 2 units -2/1 decrease semglee to 11 units from 14units, recommend nighttime snack -2/2 decreased to moderate resistance correctional SSI -12/20/22 increase mealtime novolog to 3U again and leave Semglee as is -12/21/22 increase semglee to 13u -2/9 will adjust semglee to 16units, stop mealtime- per pharm recs -2/12 CBG's with a big drop today after being in the 200's-->No recorded intake after breakfast 2/11. -2/13 Decrease semglee to 13 units due to hypoglycemia, I suspect she does not eat consistent carbs with each meal  -2/14 HS insulin and  SSI insulin scale was decreased, will consult DM coordinator -2/15 Novolog 3 units TID restarted per DM coordinator recs, appreciate assistance -2/16 hypoglycemia this AM, decrease lantus to 10units,  decrease mealtime insulin to 2 units for now, glucose control may improve as UTI is treated -01/02/23 CBGs similar in pattern, cont regimen for now, monitor for changes -2/20 decrease glargine to 9 units due to low CBG in AM -2/21 fair control continue current regimen  CBG (last 3)  Recent Labs    01/05/23 1626 01/05/23 2116 01/06/23 0656  GLUCAP 229* 129* 99      12. Chronic bilateral shoulder and left knee pain: Oxycodone prn -Refusing voltaren gel for local measures-->encourage use             -Aquathermia for local measures.              -L knee: Follows OP ortho, no benefit from injections in the past  -L knee pain improved with knee brace  13. Upper respiratory infection, Nocturnal hypoxia: Question sleep apnea.  -Encourage pulmonary hygiene -Respiratory  viral panel negative x2 inpatient -Remains with cough, intermittent SOB - add PRN inhaler, monitor  14.  Pre-renal azotemia: Likely due to diuresis. BUN up but SCr improving.   -Continue to monitor.  -2/15 BUN and Cr stable, recheck Monday 01/04/23 stable  15. Overactive bladder, incontinence: hx of, on Myrbetriq 16m QD; now off purewick -12/05/22 having increased frequency and incontinence, ordered U/A, UCx, and scheduled voiding program -12/06/22 U/A c/w UTI, see #16 -Continue myrbetriq,she is mostly continent with occasional incontinent episodes -12/28/22 continues to have improvement, cont regimen  16. UTI 12/06/22:  -U/A c/w UTI, ordered Macrobid 1070mBID x5 days (12/06/22>>12/10/22 last dose), UCx pending- Ecoli sensitive to macrobid, completed course, symptoms resolved -2/7 cloudy urine yesterday, asymptomatic, continue to monitor -2.14 having intermittant burning with urination, check U/A and culture -2/15 keflex started for UTI 7 day, follow urine culture -2/16 E coli 100,000 and E faecalis 80,000 CFU. E coli sensitive to cefazolin, symptoms improved, continue keflex -01/02/23 Keflex fell off after 7 doses (last dose 01/01/23 AM, total of 2 full days worth of Keflex given)-- reordered, but Pharmacy states Keflex won't cover E.faecalis so switched to  Macrobid 172m BID x5 days (end 01/06/23 PM)  17.  Hypokalemia  -312m x2, recheck tomorrow  -K+ up to 3.6, losartan increased today, Recheck tomorrow -1/25 K+ now elevated hyperkalemia at 5.4, lokelma 5g  x1, stop potassium supplementation, discussed with pharmacy, recheck tomorrow -2/12--5.3 no hemolysis reported on bmet. No scheduled potassium today. Give small dose of lokelma today. Also on losartan and aldactone---hold aldactone tomorrow pending repeat bmet. Consider stopping losartan too if bp's are controlled.  -2/13 K+ down to 3.9, decrease losartan dose to 2517m2/19 stable at 3.5  18. Dizziness -1/22 Will start meclizine 12.5mg47mRN, check orthostatic VS -She has HX of vertigo going back to 2013, seen by Dr. WongJacelyn Griprology, felt to be related to vestibulopathy -1/30 fall yesterday but no injury -2/2 increase meclizine to 25mg5m prn -12/28/22 --did not report dizziness. Continue meclizine prn -01/02/23 meclizine helping mostly, not causing excessive drowsiness, continue regimen  19. Constipation/Diarrhea: has chronic constipation, now with diarrhea 01/03/23  -Schedule miralax QD, PRN dulcolax supp and fleet enema -1/26 had vomiting with  sorbitol yesterday, continue miralax daily, she had large BM yesterday -1/29 miralax increased to BID -1/30 she declines stronger medication at this time,order sorbitol PRN -2/1 large bm yesterday, improved  -12/19/22 now having increased stool frequency, wants to change Miralax to BID PRN rather than scheduled; also wants Colace 100mg 67mPRN in case she needs it; knows to ask -12/28/22 multiple stools over last 24 hours--on no scheduled bowel meds -2/14 LBM today, liquid, does not appear to be on laxatives scheduled, continue monitor -01/02/23 BMs improving, cont to monitor -01/03/23 multiple loose BMs yesterday, ?food related vs abx-- slowing down this morning. Ordered imodium 2mg q616mRN, monitor closely 2/20 LBM yesterday-loose, NO BM today yet, continue to monitor  20. R shoulder pain  -Xray on 1/15 with postsurgical changes, continue voltaren gel, Kpad  -1/31 Duloxetine started as this may help with joint pain also -2/2 Duloxetine increase to 30mg -229mncreased duloxetine to 60mg   L11m33 days A FACE TO FACE [email or web address], 8:46 AM

## 2023-01-07 DIAGNOSIS — Z7722 Contact with and (suspected) exposure to environmental tobacco smoke (acute) (chronic): Secondary | ICD-10-CM | POA: Diagnosis not present

## 2023-01-07 DIAGNOSIS — Z791 Long term (current) use of non-steroidal anti-inflammatories (NSAID): Secondary | ICD-10-CM | POA: Diagnosis not present

## 2023-01-07 DIAGNOSIS — Z7982 Long term (current) use of aspirin: Secondary | ICD-10-CM | POA: Diagnosis not present

## 2023-01-07 DIAGNOSIS — I4891 Unspecified atrial fibrillation: Secondary | ICD-10-CM | POA: Diagnosis not present

## 2023-01-07 DIAGNOSIS — M25512 Pain in left shoulder: Secondary | ICD-10-CM | POA: Diagnosis not present

## 2023-01-07 DIAGNOSIS — H409 Unspecified glaucoma: Secondary | ICD-10-CM | POA: Diagnosis not present

## 2023-01-07 DIAGNOSIS — N3281 Overactive bladder: Secondary | ICD-10-CM | POA: Diagnosis not present

## 2023-01-07 DIAGNOSIS — F419 Anxiety disorder, unspecified: Secondary | ICD-10-CM | POA: Diagnosis not present

## 2023-01-07 DIAGNOSIS — H8120 Vestibular neuronitis, unspecified ear: Secondary | ICD-10-CM | POA: Diagnosis not present

## 2023-01-07 DIAGNOSIS — R32 Unspecified urinary incontinence: Secondary | ICD-10-CM | POA: Diagnosis not present

## 2023-01-07 DIAGNOSIS — R392 Extrarenal uremia: Secondary | ICD-10-CM | POA: Diagnosis not present

## 2023-01-07 DIAGNOSIS — E109 Type 1 diabetes mellitus without complications: Secondary | ICD-10-CM | POA: Diagnosis not present

## 2023-01-07 DIAGNOSIS — I5032 Chronic diastolic (congestive) heart failure: Secondary | ICD-10-CM | POA: Diagnosis not present

## 2023-01-07 DIAGNOSIS — M25511 Pain in right shoulder: Secondary | ICD-10-CM | POA: Diagnosis not present

## 2023-01-07 DIAGNOSIS — B9620 Unspecified Escherichia coli [E. coli] as the cause of diseases classified elsewhere: Secondary | ICD-10-CM | POA: Diagnosis not present

## 2023-01-07 DIAGNOSIS — R5381 Other malaise: Secondary | ICD-10-CM | POA: Diagnosis not present

## 2023-01-07 DIAGNOSIS — Z9181 History of falling: Secondary | ICD-10-CM | POA: Diagnosis not present

## 2023-01-07 DIAGNOSIS — Q809 Congenital ichthyosis, unspecified: Secondary | ICD-10-CM | POA: Diagnosis not present

## 2023-01-07 DIAGNOSIS — N390 Urinary tract infection, site not specified: Secondary | ICD-10-CM | POA: Diagnosis not present

## 2023-01-07 DIAGNOSIS — B952 Enterococcus as the cause of diseases classified elsewhere: Secondary | ICD-10-CM | POA: Diagnosis not present

## 2023-01-07 DIAGNOSIS — I110 Hypertensive heart disease with heart failure: Secondary | ICD-10-CM | POA: Diagnosis not present

## 2023-01-07 DIAGNOSIS — I4892 Unspecified atrial flutter: Secondary | ICD-10-CM | POA: Diagnosis not present

## 2023-01-07 DIAGNOSIS — M25562 Pain in left knee: Secondary | ICD-10-CM | POA: Diagnosis not present

## 2023-01-07 DIAGNOSIS — M21371 Foot drop, right foot: Secondary | ICD-10-CM | POA: Diagnosis not present

## 2023-01-07 DIAGNOSIS — H5461 Unqualified visual loss, right eye, normal vision left eye: Secondary | ICD-10-CM | POA: Diagnosis not present

## 2023-01-11 DIAGNOSIS — E109 Type 1 diabetes mellitus without complications: Secondary | ICD-10-CM | POA: Diagnosis not present

## 2023-01-11 DIAGNOSIS — I110 Hypertensive heart disease with heart failure: Secondary | ICD-10-CM | POA: Diagnosis not present

## 2023-01-11 DIAGNOSIS — H5461 Unqualified visual loss, right eye, normal vision left eye: Secondary | ICD-10-CM | POA: Diagnosis not present

## 2023-01-11 DIAGNOSIS — H8120 Vestibular neuronitis, unspecified ear: Secondary | ICD-10-CM | POA: Diagnosis not present

## 2023-01-11 DIAGNOSIS — H409 Unspecified glaucoma: Secondary | ICD-10-CM | POA: Diagnosis not present

## 2023-01-11 DIAGNOSIS — B952 Enterococcus as the cause of diseases classified elsewhere: Secondary | ICD-10-CM | POA: Diagnosis not present

## 2023-01-11 DIAGNOSIS — B9620 Unspecified Escherichia coli [E. coli] as the cause of diseases classified elsewhere: Secondary | ICD-10-CM | POA: Diagnosis not present

## 2023-01-11 DIAGNOSIS — M25562 Pain in left knee: Secondary | ICD-10-CM | POA: Diagnosis not present

## 2023-01-11 DIAGNOSIS — F419 Anxiety disorder, unspecified: Secondary | ICD-10-CM | POA: Diagnosis not present

## 2023-01-11 DIAGNOSIS — I4891 Unspecified atrial fibrillation: Secondary | ICD-10-CM | POA: Diagnosis not present

## 2023-01-11 DIAGNOSIS — R2681 Unsteadiness on feet: Secondary | ICD-10-CM | POA: Diagnosis not present

## 2023-01-11 DIAGNOSIS — R5381 Other malaise: Secondary | ICD-10-CM | POA: Diagnosis not present

## 2023-01-11 DIAGNOSIS — I5032 Chronic diastolic (congestive) heart failure: Secondary | ICD-10-CM | POA: Diagnosis not present

## 2023-01-11 DIAGNOSIS — I4892 Unspecified atrial flutter: Secondary | ICD-10-CM | POA: Diagnosis not present

## 2023-01-11 DIAGNOSIS — M21371 Foot drop, right foot: Secondary | ICD-10-CM | POA: Diagnosis not present

## 2023-01-11 DIAGNOSIS — N390 Urinary tract infection, site not specified: Secondary | ICD-10-CM | POA: Diagnosis not present

## 2023-01-11 DIAGNOSIS — M25511 Pain in right shoulder: Secondary | ICD-10-CM | POA: Diagnosis not present

## 2023-01-13 DIAGNOSIS — E109 Type 1 diabetes mellitus without complications: Secondary | ICD-10-CM | POA: Diagnosis not present

## 2023-01-13 DIAGNOSIS — I4891 Unspecified atrial fibrillation: Secondary | ICD-10-CM | POA: Diagnosis not present

## 2023-01-13 DIAGNOSIS — H409 Unspecified glaucoma: Secondary | ICD-10-CM | POA: Diagnosis not present

## 2023-01-13 DIAGNOSIS — M21371 Foot drop, right foot: Secondary | ICD-10-CM | POA: Diagnosis not present

## 2023-01-13 DIAGNOSIS — I4892 Unspecified atrial flutter: Secondary | ICD-10-CM | POA: Diagnosis not present

## 2023-01-13 DIAGNOSIS — I110 Hypertensive heart disease with heart failure: Secondary | ICD-10-CM | POA: Diagnosis not present

## 2023-01-13 DIAGNOSIS — H5461 Unqualified visual loss, right eye, normal vision left eye: Secondary | ICD-10-CM | POA: Diagnosis not present

## 2023-01-13 DIAGNOSIS — B952 Enterococcus as the cause of diseases classified elsewhere: Secondary | ICD-10-CM | POA: Diagnosis not present

## 2023-01-13 DIAGNOSIS — M25562 Pain in left knee: Secondary | ICD-10-CM | POA: Diagnosis not present

## 2023-01-13 DIAGNOSIS — H8120 Vestibular neuronitis, unspecified ear: Secondary | ICD-10-CM | POA: Diagnosis not present

## 2023-01-13 DIAGNOSIS — I5032 Chronic diastolic (congestive) heart failure: Secondary | ICD-10-CM | POA: Diagnosis not present

## 2023-01-13 DIAGNOSIS — R5381 Other malaise: Secondary | ICD-10-CM | POA: Diagnosis not present

## 2023-01-13 DIAGNOSIS — F419 Anxiety disorder, unspecified: Secondary | ICD-10-CM | POA: Diagnosis not present

## 2023-01-13 DIAGNOSIS — N390 Urinary tract infection, site not specified: Secondary | ICD-10-CM | POA: Diagnosis not present

## 2023-01-13 DIAGNOSIS — M25511 Pain in right shoulder: Secondary | ICD-10-CM | POA: Diagnosis not present

## 2023-01-13 DIAGNOSIS — B9620 Unspecified Escherichia coli [E. coli] as the cause of diseases classified elsewhere: Secondary | ICD-10-CM | POA: Diagnosis not present

## 2023-01-15 DIAGNOSIS — N390 Urinary tract infection, site not specified: Secondary | ICD-10-CM | POA: Diagnosis not present

## 2023-01-15 DIAGNOSIS — M25562 Pain in left knee: Secondary | ICD-10-CM | POA: Diagnosis not present

## 2023-01-15 DIAGNOSIS — I110 Hypertensive heart disease with heart failure: Secondary | ICD-10-CM | POA: Diagnosis not present

## 2023-01-15 DIAGNOSIS — E109 Type 1 diabetes mellitus without complications: Secondary | ICD-10-CM | POA: Diagnosis not present

## 2023-01-15 DIAGNOSIS — I4891 Unspecified atrial fibrillation: Secondary | ICD-10-CM | POA: Diagnosis not present

## 2023-01-15 DIAGNOSIS — F419 Anxiety disorder, unspecified: Secondary | ICD-10-CM | POA: Diagnosis not present

## 2023-01-15 DIAGNOSIS — B952 Enterococcus as the cause of diseases classified elsewhere: Secondary | ICD-10-CM | POA: Diagnosis not present

## 2023-01-15 DIAGNOSIS — H409 Unspecified glaucoma: Secondary | ICD-10-CM | POA: Diagnosis not present

## 2023-01-15 DIAGNOSIS — I5032 Chronic diastolic (congestive) heart failure: Secondary | ICD-10-CM | POA: Diagnosis not present

## 2023-01-15 DIAGNOSIS — H8120 Vestibular neuronitis, unspecified ear: Secondary | ICD-10-CM | POA: Diagnosis not present

## 2023-01-15 DIAGNOSIS — H5461 Unqualified visual loss, right eye, normal vision left eye: Secondary | ICD-10-CM | POA: Diagnosis not present

## 2023-01-15 DIAGNOSIS — R5381 Other malaise: Secondary | ICD-10-CM | POA: Diagnosis not present

## 2023-01-15 DIAGNOSIS — M21371 Foot drop, right foot: Secondary | ICD-10-CM | POA: Diagnosis not present

## 2023-01-15 DIAGNOSIS — M25511 Pain in right shoulder: Secondary | ICD-10-CM | POA: Diagnosis not present

## 2023-01-15 DIAGNOSIS — B9620 Unspecified Escherichia coli [E. coli] as the cause of diseases classified elsewhere: Secondary | ICD-10-CM | POA: Diagnosis not present

## 2023-01-15 DIAGNOSIS — I4892 Unspecified atrial flutter: Secondary | ICD-10-CM | POA: Diagnosis not present

## 2023-01-18 DIAGNOSIS — B9620 Unspecified Escherichia coli [E. coli] as the cause of diseases classified elsewhere: Secondary | ICD-10-CM | POA: Diagnosis not present

## 2023-01-18 DIAGNOSIS — N390 Urinary tract infection, site not specified: Secondary | ICD-10-CM | POA: Diagnosis not present

## 2023-01-18 DIAGNOSIS — M25511 Pain in right shoulder: Secondary | ICD-10-CM | POA: Diagnosis not present

## 2023-01-18 DIAGNOSIS — I110 Hypertensive heart disease with heart failure: Secondary | ICD-10-CM | POA: Diagnosis not present

## 2023-01-18 DIAGNOSIS — M21371 Foot drop, right foot: Secondary | ICD-10-CM | POA: Diagnosis not present

## 2023-01-18 DIAGNOSIS — M25562 Pain in left knee: Secondary | ICD-10-CM | POA: Diagnosis not present

## 2023-01-18 DIAGNOSIS — B952 Enterococcus as the cause of diseases classified elsewhere: Secondary | ICD-10-CM | POA: Diagnosis not present

## 2023-01-18 DIAGNOSIS — H5461 Unqualified visual loss, right eye, normal vision left eye: Secondary | ICD-10-CM | POA: Diagnosis not present

## 2023-01-18 DIAGNOSIS — H409 Unspecified glaucoma: Secondary | ICD-10-CM | POA: Diagnosis not present

## 2023-01-18 DIAGNOSIS — I4891 Unspecified atrial fibrillation: Secondary | ICD-10-CM | POA: Diagnosis not present

## 2023-01-18 DIAGNOSIS — R5381 Other malaise: Secondary | ICD-10-CM | POA: Diagnosis not present

## 2023-01-18 DIAGNOSIS — E109 Type 1 diabetes mellitus without complications: Secondary | ICD-10-CM | POA: Diagnosis not present

## 2023-01-18 DIAGNOSIS — I4892 Unspecified atrial flutter: Secondary | ICD-10-CM | POA: Diagnosis not present

## 2023-01-18 DIAGNOSIS — H8120 Vestibular neuronitis, unspecified ear: Secondary | ICD-10-CM | POA: Diagnosis not present

## 2023-01-18 DIAGNOSIS — I5032 Chronic diastolic (congestive) heart failure: Secondary | ICD-10-CM | POA: Diagnosis not present

## 2023-01-18 DIAGNOSIS — F419 Anxiety disorder, unspecified: Secondary | ICD-10-CM | POA: Diagnosis not present

## 2023-01-19 DIAGNOSIS — B952 Enterococcus as the cause of diseases classified elsewhere: Secondary | ICD-10-CM | POA: Diagnosis not present

## 2023-01-19 DIAGNOSIS — F419 Anxiety disorder, unspecified: Secondary | ICD-10-CM | POA: Diagnosis not present

## 2023-01-19 DIAGNOSIS — R5381 Other malaise: Secondary | ICD-10-CM | POA: Diagnosis not present

## 2023-01-19 DIAGNOSIS — N390 Urinary tract infection, site not specified: Secondary | ICD-10-CM | POA: Diagnosis not present

## 2023-01-19 DIAGNOSIS — I110 Hypertensive heart disease with heart failure: Secondary | ICD-10-CM | POA: Diagnosis not present

## 2023-01-19 DIAGNOSIS — M25511 Pain in right shoulder: Secondary | ICD-10-CM | POA: Diagnosis not present

## 2023-01-19 DIAGNOSIS — B9620 Unspecified Escherichia coli [E. coli] as the cause of diseases classified elsewhere: Secondary | ICD-10-CM | POA: Diagnosis not present

## 2023-01-19 DIAGNOSIS — H8120 Vestibular neuronitis, unspecified ear: Secondary | ICD-10-CM | POA: Diagnosis not present

## 2023-01-19 DIAGNOSIS — I4891 Unspecified atrial fibrillation: Secondary | ICD-10-CM | POA: Diagnosis not present

## 2023-01-19 DIAGNOSIS — E109 Type 1 diabetes mellitus without complications: Secondary | ICD-10-CM | POA: Diagnosis not present

## 2023-01-19 DIAGNOSIS — I5032 Chronic diastolic (congestive) heart failure: Secondary | ICD-10-CM | POA: Diagnosis not present

## 2023-01-19 DIAGNOSIS — H5461 Unqualified visual loss, right eye, normal vision left eye: Secondary | ICD-10-CM | POA: Diagnosis not present

## 2023-01-19 DIAGNOSIS — M21371 Foot drop, right foot: Secondary | ICD-10-CM | POA: Diagnosis not present

## 2023-01-19 DIAGNOSIS — H409 Unspecified glaucoma: Secondary | ICD-10-CM | POA: Diagnosis not present

## 2023-01-19 DIAGNOSIS — M25562 Pain in left knee: Secondary | ICD-10-CM | POA: Diagnosis not present

## 2023-01-19 DIAGNOSIS — I4892 Unspecified atrial flutter: Secondary | ICD-10-CM | POA: Diagnosis not present

## 2023-01-21 DIAGNOSIS — I5032 Chronic diastolic (congestive) heart failure: Secondary | ICD-10-CM | POA: Diagnosis not present

## 2023-01-21 DIAGNOSIS — F419 Anxiety disorder, unspecified: Secondary | ICD-10-CM | POA: Diagnosis not present

## 2023-01-21 DIAGNOSIS — R5381 Other malaise: Secondary | ICD-10-CM | POA: Diagnosis not present

## 2023-01-21 DIAGNOSIS — H5461 Unqualified visual loss, right eye, normal vision left eye: Secondary | ICD-10-CM | POA: Diagnosis not present

## 2023-01-21 DIAGNOSIS — I4892 Unspecified atrial flutter: Secondary | ICD-10-CM | POA: Diagnosis not present

## 2023-01-21 DIAGNOSIS — I110 Hypertensive heart disease with heart failure: Secondary | ICD-10-CM | POA: Diagnosis not present

## 2023-01-21 DIAGNOSIS — B9620 Unspecified Escherichia coli [E. coli] as the cause of diseases classified elsewhere: Secondary | ICD-10-CM | POA: Diagnosis not present

## 2023-01-21 DIAGNOSIS — E109 Type 1 diabetes mellitus without complications: Secondary | ICD-10-CM | POA: Diagnosis not present

## 2023-01-21 DIAGNOSIS — M25511 Pain in right shoulder: Secondary | ICD-10-CM | POA: Diagnosis not present

## 2023-01-21 DIAGNOSIS — H409 Unspecified glaucoma: Secondary | ICD-10-CM | POA: Diagnosis not present

## 2023-01-21 DIAGNOSIS — M25562 Pain in left knee: Secondary | ICD-10-CM | POA: Diagnosis not present

## 2023-01-21 DIAGNOSIS — I4891 Unspecified atrial fibrillation: Secondary | ICD-10-CM | POA: Diagnosis not present

## 2023-01-21 DIAGNOSIS — N390 Urinary tract infection, site not specified: Secondary | ICD-10-CM | POA: Diagnosis not present

## 2023-01-21 DIAGNOSIS — M21371 Foot drop, right foot: Secondary | ICD-10-CM | POA: Diagnosis not present

## 2023-01-21 DIAGNOSIS — B952 Enterococcus as the cause of diseases classified elsewhere: Secondary | ICD-10-CM | POA: Diagnosis not present

## 2023-01-21 DIAGNOSIS — H8120 Vestibular neuronitis, unspecified ear: Secondary | ICD-10-CM | POA: Diagnosis not present

## 2023-01-22 DIAGNOSIS — I5032 Chronic diastolic (congestive) heart failure: Secondary | ICD-10-CM | POA: Diagnosis not present

## 2023-01-22 DIAGNOSIS — M25511 Pain in right shoulder: Secondary | ICD-10-CM | POA: Diagnosis not present

## 2023-01-22 DIAGNOSIS — H5461 Unqualified visual loss, right eye, normal vision left eye: Secondary | ICD-10-CM | POA: Diagnosis not present

## 2023-01-22 DIAGNOSIS — H8120 Vestibular neuronitis, unspecified ear: Secondary | ICD-10-CM | POA: Diagnosis not present

## 2023-01-22 DIAGNOSIS — I4892 Unspecified atrial flutter: Secondary | ICD-10-CM | POA: Diagnosis not present

## 2023-01-22 DIAGNOSIS — F419 Anxiety disorder, unspecified: Secondary | ICD-10-CM | POA: Diagnosis not present

## 2023-01-22 DIAGNOSIS — I110 Hypertensive heart disease with heart failure: Secondary | ICD-10-CM | POA: Diagnosis not present

## 2023-01-22 DIAGNOSIS — E109 Type 1 diabetes mellitus without complications: Secondary | ICD-10-CM | POA: Diagnosis not present

## 2023-01-22 DIAGNOSIS — N390 Urinary tract infection, site not specified: Secondary | ICD-10-CM | POA: Diagnosis not present

## 2023-01-22 DIAGNOSIS — B952 Enterococcus as the cause of diseases classified elsewhere: Secondary | ICD-10-CM | POA: Diagnosis not present

## 2023-01-22 DIAGNOSIS — R5381 Other malaise: Secondary | ICD-10-CM | POA: Diagnosis not present

## 2023-01-22 DIAGNOSIS — M21371 Foot drop, right foot: Secondary | ICD-10-CM | POA: Diagnosis not present

## 2023-01-22 DIAGNOSIS — I4891 Unspecified atrial fibrillation: Secondary | ICD-10-CM | POA: Diagnosis not present

## 2023-01-22 DIAGNOSIS — B9620 Unspecified Escherichia coli [E. coli] as the cause of diseases classified elsewhere: Secondary | ICD-10-CM | POA: Diagnosis not present

## 2023-01-22 DIAGNOSIS — H409 Unspecified glaucoma: Secondary | ICD-10-CM | POA: Diagnosis not present

## 2023-01-22 DIAGNOSIS — M25562 Pain in left knee: Secondary | ICD-10-CM | POA: Diagnosis not present

## 2023-01-25 DIAGNOSIS — H5461 Unqualified visual loss, right eye, normal vision left eye: Secondary | ICD-10-CM | POA: Diagnosis not present

## 2023-01-25 DIAGNOSIS — F419 Anxiety disorder, unspecified: Secondary | ICD-10-CM | POA: Diagnosis not present

## 2023-01-25 DIAGNOSIS — I5032 Chronic diastolic (congestive) heart failure: Secondary | ICD-10-CM | POA: Diagnosis not present

## 2023-01-25 DIAGNOSIS — R5381 Other malaise: Secondary | ICD-10-CM | POA: Diagnosis not present

## 2023-01-25 DIAGNOSIS — I4892 Unspecified atrial flutter: Secondary | ICD-10-CM | POA: Diagnosis not present

## 2023-01-25 DIAGNOSIS — E109 Type 1 diabetes mellitus without complications: Secondary | ICD-10-CM | POA: Diagnosis not present

## 2023-01-25 DIAGNOSIS — I110 Hypertensive heart disease with heart failure: Secondary | ICD-10-CM | POA: Diagnosis not present

## 2023-01-25 DIAGNOSIS — I4891 Unspecified atrial fibrillation: Secondary | ICD-10-CM | POA: Diagnosis not present

## 2023-01-25 DIAGNOSIS — B9620 Unspecified Escherichia coli [E. coli] as the cause of diseases classified elsewhere: Secondary | ICD-10-CM | POA: Diagnosis not present

## 2023-01-25 DIAGNOSIS — M25511 Pain in right shoulder: Secondary | ICD-10-CM | POA: Diagnosis not present

## 2023-01-25 DIAGNOSIS — H8120 Vestibular neuronitis, unspecified ear: Secondary | ICD-10-CM | POA: Diagnosis not present

## 2023-01-25 DIAGNOSIS — M25562 Pain in left knee: Secondary | ICD-10-CM | POA: Diagnosis not present

## 2023-01-25 DIAGNOSIS — N390 Urinary tract infection, site not specified: Secondary | ICD-10-CM | POA: Diagnosis not present

## 2023-01-25 DIAGNOSIS — B952 Enterococcus as the cause of diseases classified elsewhere: Secondary | ICD-10-CM | POA: Diagnosis not present

## 2023-01-25 DIAGNOSIS — M21371 Foot drop, right foot: Secondary | ICD-10-CM | POA: Diagnosis not present

## 2023-01-25 DIAGNOSIS — H409 Unspecified glaucoma: Secondary | ICD-10-CM | POA: Diagnosis not present

## 2023-01-26 DIAGNOSIS — E1051 Type 1 diabetes mellitus with diabetic peripheral angiopathy without gangrene: Secondary | ICD-10-CM | POA: Diagnosis not present

## 2023-01-26 DIAGNOSIS — I480 Paroxysmal atrial fibrillation: Secondary | ICD-10-CM | POA: Diagnosis not present

## 2023-01-26 DIAGNOSIS — M25562 Pain in left knee: Secondary | ICD-10-CM | POA: Diagnosis not present

## 2023-01-26 DIAGNOSIS — I5033 Acute on chronic diastolic (congestive) heart failure: Secondary | ICD-10-CM | POA: Diagnosis not present

## 2023-01-27 DIAGNOSIS — B9620 Unspecified Escherichia coli [E. coli] as the cause of diseases classified elsewhere: Secondary | ICD-10-CM | POA: Diagnosis not present

## 2023-01-27 DIAGNOSIS — I5032 Chronic diastolic (congestive) heart failure: Secondary | ICD-10-CM | POA: Diagnosis not present

## 2023-01-27 DIAGNOSIS — B952 Enterococcus as the cause of diseases classified elsewhere: Secondary | ICD-10-CM | POA: Diagnosis not present

## 2023-01-27 DIAGNOSIS — N390 Urinary tract infection, site not specified: Secondary | ICD-10-CM | POA: Diagnosis not present

## 2023-01-27 DIAGNOSIS — E109 Type 1 diabetes mellitus without complications: Secondary | ICD-10-CM | POA: Diagnosis not present

## 2023-01-27 DIAGNOSIS — F419 Anxiety disorder, unspecified: Secondary | ICD-10-CM | POA: Diagnosis not present

## 2023-01-27 DIAGNOSIS — I4892 Unspecified atrial flutter: Secondary | ICD-10-CM | POA: Diagnosis not present

## 2023-01-27 DIAGNOSIS — M21371 Foot drop, right foot: Secondary | ICD-10-CM | POA: Diagnosis not present

## 2023-01-27 DIAGNOSIS — H409 Unspecified glaucoma: Secondary | ICD-10-CM | POA: Diagnosis not present

## 2023-01-27 DIAGNOSIS — I110 Hypertensive heart disease with heart failure: Secondary | ICD-10-CM | POA: Diagnosis not present

## 2023-01-27 DIAGNOSIS — H8120 Vestibular neuronitis, unspecified ear: Secondary | ICD-10-CM | POA: Diagnosis not present

## 2023-01-27 DIAGNOSIS — R5381 Other malaise: Secondary | ICD-10-CM | POA: Diagnosis not present

## 2023-01-27 DIAGNOSIS — H5461 Unqualified visual loss, right eye, normal vision left eye: Secondary | ICD-10-CM | POA: Diagnosis not present

## 2023-01-27 DIAGNOSIS — M25511 Pain in right shoulder: Secondary | ICD-10-CM | POA: Diagnosis not present

## 2023-01-27 DIAGNOSIS — M25562 Pain in left knee: Secondary | ICD-10-CM | POA: Diagnosis not present

## 2023-01-27 DIAGNOSIS — I4891 Unspecified atrial fibrillation: Secondary | ICD-10-CM | POA: Diagnosis not present

## 2023-01-28 DIAGNOSIS — F419 Anxiety disorder, unspecified: Secondary | ICD-10-CM | POA: Diagnosis not present

## 2023-01-28 DIAGNOSIS — I110 Hypertensive heart disease with heart failure: Secondary | ICD-10-CM | POA: Diagnosis not present

## 2023-01-28 DIAGNOSIS — M21371 Foot drop, right foot: Secondary | ICD-10-CM | POA: Diagnosis not present

## 2023-01-28 DIAGNOSIS — I5032 Chronic diastolic (congestive) heart failure: Secondary | ICD-10-CM | POA: Diagnosis not present

## 2023-01-28 DIAGNOSIS — B952 Enterococcus as the cause of diseases classified elsewhere: Secondary | ICD-10-CM | POA: Diagnosis not present

## 2023-01-28 DIAGNOSIS — E109 Type 1 diabetes mellitus without complications: Secondary | ICD-10-CM | POA: Diagnosis not present

## 2023-01-28 DIAGNOSIS — M25511 Pain in right shoulder: Secondary | ICD-10-CM | POA: Diagnosis not present

## 2023-01-28 DIAGNOSIS — H8120 Vestibular neuronitis, unspecified ear: Secondary | ICD-10-CM | POA: Diagnosis not present

## 2023-01-28 DIAGNOSIS — I4892 Unspecified atrial flutter: Secondary | ICD-10-CM | POA: Diagnosis not present

## 2023-01-28 DIAGNOSIS — H5461 Unqualified visual loss, right eye, normal vision left eye: Secondary | ICD-10-CM | POA: Diagnosis not present

## 2023-01-28 DIAGNOSIS — R5381 Other malaise: Secondary | ICD-10-CM | POA: Diagnosis not present

## 2023-01-28 DIAGNOSIS — B9620 Unspecified Escherichia coli [E. coli] as the cause of diseases classified elsewhere: Secondary | ICD-10-CM | POA: Diagnosis not present

## 2023-01-28 DIAGNOSIS — M25562 Pain in left knee: Secondary | ICD-10-CM | POA: Diagnosis not present

## 2023-01-28 DIAGNOSIS — I4891 Unspecified atrial fibrillation: Secondary | ICD-10-CM | POA: Diagnosis not present

## 2023-01-28 DIAGNOSIS — H409 Unspecified glaucoma: Secondary | ICD-10-CM | POA: Diagnosis not present

## 2023-01-28 DIAGNOSIS — N390 Urinary tract infection, site not specified: Secondary | ICD-10-CM | POA: Diagnosis not present

## 2023-02-01 DIAGNOSIS — M25562 Pain in left knee: Secondary | ICD-10-CM | POA: Diagnosis not present

## 2023-02-01 DIAGNOSIS — I4892 Unspecified atrial flutter: Secondary | ICD-10-CM | POA: Diagnosis not present

## 2023-02-01 DIAGNOSIS — B9620 Unspecified Escherichia coli [E. coli] as the cause of diseases classified elsewhere: Secondary | ICD-10-CM | POA: Diagnosis not present

## 2023-02-01 DIAGNOSIS — N390 Urinary tract infection, site not specified: Secondary | ICD-10-CM | POA: Diagnosis not present

## 2023-02-01 DIAGNOSIS — H8120 Vestibular neuronitis, unspecified ear: Secondary | ICD-10-CM | POA: Diagnosis not present

## 2023-02-01 DIAGNOSIS — I4891 Unspecified atrial fibrillation: Secondary | ICD-10-CM | POA: Diagnosis not present

## 2023-02-01 DIAGNOSIS — F419 Anxiety disorder, unspecified: Secondary | ICD-10-CM | POA: Diagnosis not present

## 2023-02-01 DIAGNOSIS — E109 Type 1 diabetes mellitus without complications: Secondary | ICD-10-CM | POA: Diagnosis not present

## 2023-02-01 DIAGNOSIS — I5032 Chronic diastolic (congestive) heart failure: Secondary | ICD-10-CM | POA: Diagnosis not present

## 2023-02-01 DIAGNOSIS — B952 Enterococcus as the cause of diseases classified elsewhere: Secondary | ICD-10-CM | POA: Diagnosis not present

## 2023-02-01 DIAGNOSIS — H409 Unspecified glaucoma: Secondary | ICD-10-CM | POA: Diagnosis not present

## 2023-02-01 DIAGNOSIS — M21371 Foot drop, right foot: Secondary | ICD-10-CM | POA: Diagnosis not present

## 2023-02-01 DIAGNOSIS — M25511 Pain in right shoulder: Secondary | ICD-10-CM | POA: Diagnosis not present

## 2023-02-01 DIAGNOSIS — I110 Hypertensive heart disease with heart failure: Secondary | ICD-10-CM | POA: Diagnosis not present

## 2023-02-01 DIAGNOSIS — H5461 Unqualified visual loss, right eye, normal vision left eye: Secondary | ICD-10-CM | POA: Diagnosis not present

## 2023-02-01 DIAGNOSIS — R5381 Other malaise: Secondary | ICD-10-CM | POA: Diagnosis not present

## 2023-02-02 DIAGNOSIS — H8120 Vestibular neuronitis, unspecified ear: Secondary | ICD-10-CM | POA: Diagnosis not present

## 2023-02-02 DIAGNOSIS — R5381 Other malaise: Secondary | ICD-10-CM | POA: Diagnosis not present

## 2023-02-02 DIAGNOSIS — B9620 Unspecified Escherichia coli [E. coli] as the cause of diseases classified elsewhere: Secondary | ICD-10-CM | POA: Diagnosis not present

## 2023-02-02 DIAGNOSIS — H409 Unspecified glaucoma: Secondary | ICD-10-CM | POA: Diagnosis not present

## 2023-02-02 DIAGNOSIS — M21371 Foot drop, right foot: Secondary | ICD-10-CM | POA: Diagnosis not present

## 2023-02-02 DIAGNOSIS — I5032 Chronic diastolic (congestive) heart failure: Secondary | ICD-10-CM | POA: Diagnosis not present

## 2023-02-02 DIAGNOSIS — I4891 Unspecified atrial fibrillation: Secondary | ICD-10-CM | POA: Diagnosis not present

## 2023-02-02 DIAGNOSIS — B952 Enterococcus as the cause of diseases classified elsewhere: Secondary | ICD-10-CM | POA: Diagnosis not present

## 2023-02-02 DIAGNOSIS — I110 Hypertensive heart disease with heart failure: Secondary | ICD-10-CM | POA: Diagnosis not present

## 2023-02-02 DIAGNOSIS — M25562 Pain in left knee: Secondary | ICD-10-CM | POA: Diagnosis not present

## 2023-02-02 DIAGNOSIS — M25511 Pain in right shoulder: Secondary | ICD-10-CM | POA: Diagnosis not present

## 2023-02-02 DIAGNOSIS — I4892 Unspecified atrial flutter: Secondary | ICD-10-CM | POA: Diagnosis not present

## 2023-02-02 DIAGNOSIS — E109 Type 1 diabetes mellitus without complications: Secondary | ICD-10-CM | POA: Diagnosis not present

## 2023-02-02 DIAGNOSIS — H5461 Unqualified visual loss, right eye, normal vision left eye: Secondary | ICD-10-CM | POA: Diagnosis not present

## 2023-02-02 DIAGNOSIS — F419 Anxiety disorder, unspecified: Secondary | ICD-10-CM | POA: Diagnosis not present

## 2023-02-02 DIAGNOSIS — N390 Urinary tract infection, site not specified: Secondary | ICD-10-CM | POA: Diagnosis not present

## 2023-02-03 ENCOUNTER — Other Ambulatory Visit (HOSPITAL_COMMUNITY): Payer: Self-pay

## 2023-02-04 DIAGNOSIS — I4891 Unspecified atrial fibrillation: Secondary | ICD-10-CM | POA: Diagnosis not present

## 2023-02-04 DIAGNOSIS — F419 Anxiety disorder, unspecified: Secondary | ICD-10-CM | POA: Diagnosis not present

## 2023-02-04 DIAGNOSIS — E109 Type 1 diabetes mellitus without complications: Secondary | ICD-10-CM | POA: Diagnosis not present

## 2023-02-04 DIAGNOSIS — H8120 Vestibular neuronitis, unspecified ear: Secondary | ICD-10-CM | POA: Diagnosis not present

## 2023-02-04 DIAGNOSIS — M25562 Pain in left knee: Secondary | ICD-10-CM | POA: Diagnosis not present

## 2023-02-04 DIAGNOSIS — H5461 Unqualified visual loss, right eye, normal vision left eye: Secondary | ICD-10-CM | POA: Diagnosis not present

## 2023-02-04 DIAGNOSIS — B9620 Unspecified Escherichia coli [E. coli] as the cause of diseases classified elsewhere: Secondary | ICD-10-CM | POA: Diagnosis not present

## 2023-02-04 DIAGNOSIS — M21371 Foot drop, right foot: Secondary | ICD-10-CM | POA: Diagnosis not present

## 2023-02-04 DIAGNOSIS — I4892 Unspecified atrial flutter: Secondary | ICD-10-CM | POA: Diagnosis not present

## 2023-02-04 DIAGNOSIS — N390 Urinary tract infection, site not specified: Secondary | ICD-10-CM | POA: Diagnosis not present

## 2023-02-04 DIAGNOSIS — R5381 Other malaise: Secondary | ICD-10-CM | POA: Diagnosis not present

## 2023-02-04 DIAGNOSIS — H409 Unspecified glaucoma: Secondary | ICD-10-CM | POA: Diagnosis not present

## 2023-02-04 DIAGNOSIS — I110 Hypertensive heart disease with heart failure: Secondary | ICD-10-CM | POA: Diagnosis not present

## 2023-02-04 DIAGNOSIS — M25511 Pain in right shoulder: Secondary | ICD-10-CM | POA: Diagnosis not present

## 2023-02-04 DIAGNOSIS — I5032 Chronic diastolic (congestive) heart failure: Secondary | ICD-10-CM | POA: Diagnosis not present

## 2023-02-04 DIAGNOSIS — B952 Enterococcus as the cause of diseases classified elsewhere: Secondary | ICD-10-CM | POA: Diagnosis not present

## 2023-02-05 DIAGNOSIS — H5461 Unqualified visual loss, right eye, normal vision left eye: Secondary | ICD-10-CM | POA: Diagnosis not present

## 2023-02-05 DIAGNOSIS — F419 Anxiety disorder, unspecified: Secondary | ICD-10-CM | POA: Diagnosis not present

## 2023-02-05 DIAGNOSIS — I4892 Unspecified atrial flutter: Secondary | ICD-10-CM | POA: Diagnosis not present

## 2023-02-05 DIAGNOSIS — M21371 Foot drop, right foot: Secondary | ICD-10-CM | POA: Diagnosis not present

## 2023-02-05 DIAGNOSIS — I110 Hypertensive heart disease with heart failure: Secondary | ICD-10-CM | POA: Diagnosis not present

## 2023-02-05 DIAGNOSIS — I5032 Chronic diastolic (congestive) heart failure: Secondary | ICD-10-CM | POA: Diagnosis not present

## 2023-02-05 DIAGNOSIS — B9620 Unspecified Escherichia coli [E. coli] as the cause of diseases classified elsewhere: Secondary | ICD-10-CM | POA: Diagnosis not present

## 2023-02-05 DIAGNOSIS — M25511 Pain in right shoulder: Secondary | ICD-10-CM | POA: Diagnosis not present

## 2023-02-05 DIAGNOSIS — B952 Enterococcus as the cause of diseases classified elsewhere: Secondary | ICD-10-CM | POA: Diagnosis not present

## 2023-02-05 DIAGNOSIS — H409 Unspecified glaucoma: Secondary | ICD-10-CM | POA: Diagnosis not present

## 2023-02-05 DIAGNOSIS — R5381 Other malaise: Secondary | ICD-10-CM | POA: Diagnosis not present

## 2023-02-05 DIAGNOSIS — E109 Type 1 diabetes mellitus without complications: Secondary | ICD-10-CM | POA: Diagnosis not present

## 2023-02-05 DIAGNOSIS — M25562 Pain in left knee: Secondary | ICD-10-CM | POA: Diagnosis not present

## 2023-02-05 DIAGNOSIS — H8120 Vestibular neuronitis, unspecified ear: Secondary | ICD-10-CM | POA: Diagnosis not present

## 2023-02-05 DIAGNOSIS — N390 Urinary tract infection, site not specified: Secondary | ICD-10-CM | POA: Diagnosis not present

## 2023-02-05 DIAGNOSIS — I4891 Unspecified atrial fibrillation: Secondary | ICD-10-CM | POA: Diagnosis not present

## 2023-02-06 DIAGNOSIS — N390 Urinary tract infection, site not specified: Secondary | ICD-10-CM | POA: Diagnosis not present

## 2023-02-06 DIAGNOSIS — M25511 Pain in right shoulder: Secondary | ICD-10-CM | POA: Diagnosis not present

## 2023-02-06 DIAGNOSIS — M25512 Pain in left shoulder: Secondary | ICD-10-CM | POA: Diagnosis not present

## 2023-02-06 DIAGNOSIS — I110 Hypertensive heart disease with heart failure: Secondary | ICD-10-CM | POA: Diagnosis not present

## 2023-02-06 DIAGNOSIS — R32 Unspecified urinary incontinence: Secondary | ICD-10-CM | POA: Diagnosis not present

## 2023-02-06 DIAGNOSIS — Z7982 Long term (current) use of aspirin: Secondary | ICD-10-CM | POA: Diagnosis not present

## 2023-02-06 DIAGNOSIS — Z791 Long term (current) use of non-steroidal anti-inflammatories (NSAID): Secondary | ICD-10-CM | POA: Diagnosis not present

## 2023-02-06 DIAGNOSIS — B952 Enterococcus as the cause of diseases classified elsewhere: Secondary | ICD-10-CM | POA: Diagnosis not present

## 2023-02-06 DIAGNOSIS — M25562 Pain in left knee: Secondary | ICD-10-CM | POA: Diagnosis not present

## 2023-02-06 DIAGNOSIS — I4892 Unspecified atrial flutter: Secondary | ICD-10-CM | POA: Diagnosis not present

## 2023-02-06 DIAGNOSIS — F419 Anxiety disorder, unspecified: Secondary | ICD-10-CM | POA: Diagnosis not present

## 2023-02-06 DIAGNOSIS — H8120 Vestibular neuronitis, unspecified ear: Secondary | ICD-10-CM | POA: Diagnosis not present

## 2023-02-06 DIAGNOSIS — H5461 Unqualified visual loss, right eye, normal vision left eye: Secondary | ICD-10-CM | POA: Diagnosis not present

## 2023-02-06 DIAGNOSIS — M21371 Foot drop, right foot: Secondary | ICD-10-CM | POA: Diagnosis not present

## 2023-02-06 DIAGNOSIS — Z7722 Contact with and (suspected) exposure to environmental tobacco smoke (acute) (chronic): Secondary | ICD-10-CM | POA: Diagnosis not present

## 2023-02-06 DIAGNOSIS — Q809 Congenital ichthyosis, unspecified: Secondary | ICD-10-CM | POA: Diagnosis not present

## 2023-02-06 DIAGNOSIS — R392 Extrarenal uremia: Secondary | ICD-10-CM | POA: Diagnosis not present

## 2023-02-06 DIAGNOSIS — I5032 Chronic diastolic (congestive) heart failure: Secondary | ICD-10-CM | POA: Diagnosis not present

## 2023-02-06 DIAGNOSIS — E109 Type 1 diabetes mellitus without complications: Secondary | ICD-10-CM | POA: Diagnosis not present

## 2023-02-06 DIAGNOSIS — Z9181 History of falling: Secondary | ICD-10-CM | POA: Diagnosis not present

## 2023-02-06 DIAGNOSIS — H409 Unspecified glaucoma: Secondary | ICD-10-CM | POA: Diagnosis not present

## 2023-02-06 DIAGNOSIS — I4891 Unspecified atrial fibrillation: Secondary | ICD-10-CM | POA: Diagnosis not present

## 2023-02-06 DIAGNOSIS — N3281 Overactive bladder: Secondary | ICD-10-CM | POA: Diagnosis not present

## 2023-02-06 DIAGNOSIS — R5381 Other malaise: Secondary | ICD-10-CM | POA: Diagnosis not present

## 2023-02-06 DIAGNOSIS — B9620 Unspecified Escherichia coli [E. coli] as the cause of diseases classified elsewhere: Secondary | ICD-10-CM | POA: Diagnosis not present

## 2023-02-09 DIAGNOSIS — B9620 Unspecified Escherichia coli [E. coli] as the cause of diseases classified elsewhere: Secondary | ICD-10-CM | POA: Diagnosis not present

## 2023-02-09 DIAGNOSIS — N390 Urinary tract infection, site not specified: Secondary | ICD-10-CM | POA: Diagnosis not present

## 2023-02-09 DIAGNOSIS — E109 Type 1 diabetes mellitus without complications: Secondary | ICD-10-CM | POA: Diagnosis not present

## 2023-02-09 DIAGNOSIS — H8120 Vestibular neuronitis, unspecified ear: Secondary | ICD-10-CM | POA: Diagnosis not present

## 2023-02-09 DIAGNOSIS — M25562 Pain in left knee: Secondary | ICD-10-CM | POA: Diagnosis not present

## 2023-02-09 DIAGNOSIS — R5381 Other malaise: Secondary | ICD-10-CM | POA: Diagnosis not present

## 2023-02-09 DIAGNOSIS — Z791 Long term (current) use of non-steroidal anti-inflammatories (NSAID): Secondary | ICD-10-CM | POA: Diagnosis not present

## 2023-02-09 DIAGNOSIS — R32 Unspecified urinary incontinence: Secondary | ICD-10-CM | POA: Diagnosis not present

## 2023-02-09 DIAGNOSIS — M25512 Pain in left shoulder: Secondary | ICD-10-CM | POA: Diagnosis not present

## 2023-02-09 DIAGNOSIS — B952 Enterococcus as the cause of diseases classified elsewhere: Secondary | ICD-10-CM | POA: Diagnosis not present

## 2023-02-09 DIAGNOSIS — M25511 Pain in right shoulder: Secondary | ICD-10-CM | POA: Diagnosis not present

## 2023-02-09 DIAGNOSIS — M21371 Foot drop, right foot: Secondary | ICD-10-CM | POA: Diagnosis not present

## 2023-02-09 DIAGNOSIS — R392 Extrarenal uremia: Secondary | ICD-10-CM | POA: Diagnosis not present

## 2023-02-09 DIAGNOSIS — Z7722 Contact with and (suspected) exposure to environmental tobacco smoke (acute) (chronic): Secondary | ICD-10-CM | POA: Diagnosis not present

## 2023-02-09 DIAGNOSIS — Q809 Congenital ichthyosis, unspecified: Secondary | ICD-10-CM | POA: Diagnosis not present

## 2023-02-09 DIAGNOSIS — Z7982 Long term (current) use of aspirin: Secondary | ICD-10-CM | POA: Diagnosis not present

## 2023-02-09 DIAGNOSIS — Z9181 History of falling: Secondary | ICD-10-CM | POA: Diagnosis not present

## 2023-02-09 DIAGNOSIS — I5032 Chronic diastolic (congestive) heart failure: Secondary | ICD-10-CM | POA: Diagnosis not present

## 2023-02-09 DIAGNOSIS — H5461 Unqualified visual loss, right eye, normal vision left eye: Secondary | ICD-10-CM | POA: Diagnosis not present

## 2023-02-09 DIAGNOSIS — I110 Hypertensive heart disease with heart failure: Secondary | ICD-10-CM | POA: Diagnosis not present

## 2023-02-09 DIAGNOSIS — I4892 Unspecified atrial flutter: Secondary | ICD-10-CM | POA: Diagnosis not present

## 2023-02-09 DIAGNOSIS — N3281 Overactive bladder: Secondary | ICD-10-CM | POA: Diagnosis not present

## 2023-02-09 DIAGNOSIS — R2681 Unsteadiness on feet: Secondary | ICD-10-CM | POA: Diagnosis not present

## 2023-02-09 DIAGNOSIS — I4891 Unspecified atrial fibrillation: Secondary | ICD-10-CM | POA: Diagnosis not present

## 2023-02-09 DIAGNOSIS — F419 Anxiety disorder, unspecified: Secondary | ICD-10-CM | POA: Diagnosis not present

## 2023-02-09 DIAGNOSIS — H409 Unspecified glaucoma: Secondary | ICD-10-CM | POA: Diagnosis not present

## 2023-02-10 DIAGNOSIS — Z791 Long term (current) use of non-steroidal anti-inflammatories (NSAID): Secondary | ICD-10-CM | POA: Diagnosis not present

## 2023-02-10 DIAGNOSIS — R5381 Other malaise: Secondary | ICD-10-CM | POA: Diagnosis not present

## 2023-02-10 DIAGNOSIS — H409 Unspecified glaucoma: Secondary | ICD-10-CM | POA: Diagnosis not present

## 2023-02-10 DIAGNOSIS — I4891 Unspecified atrial fibrillation: Secondary | ICD-10-CM | POA: Diagnosis not present

## 2023-02-10 DIAGNOSIS — H5461 Unqualified visual loss, right eye, normal vision left eye: Secondary | ICD-10-CM | POA: Diagnosis not present

## 2023-02-10 DIAGNOSIS — Z7722 Contact with and (suspected) exposure to environmental tobacco smoke (acute) (chronic): Secondary | ICD-10-CM | POA: Diagnosis not present

## 2023-02-10 DIAGNOSIS — Z7982 Long term (current) use of aspirin: Secondary | ICD-10-CM | POA: Diagnosis not present

## 2023-02-10 DIAGNOSIS — Q809 Congenital ichthyosis, unspecified: Secondary | ICD-10-CM | POA: Diagnosis not present

## 2023-02-10 DIAGNOSIS — M21371 Foot drop, right foot: Secondary | ICD-10-CM | POA: Diagnosis not present

## 2023-02-10 DIAGNOSIS — B9620 Unspecified Escherichia coli [E. coli] as the cause of diseases classified elsewhere: Secondary | ICD-10-CM | POA: Diagnosis not present

## 2023-02-10 DIAGNOSIS — R32 Unspecified urinary incontinence: Secondary | ICD-10-CM | POA: Diagnosis not present

## 2023-02-10 DIAGNOSIS — I5032 Chronic diastolic (congestive) heart failure: Secondary | ICD-10-CM | POA: Diagnosis not present

## 2023-02-10 DIAGNOSIS — R392 Extrarenal uremia: Secondary | ICD-10-CM | POA: Diagnosis not present

## 2023-02-10 DIAGNOSIS — N390 Urinary tract infection, site not specified: Secondary | ICD-10-CM | POA: Diagnosis not present

## 2023-02-10 DIAGNOSIS — Z9181 History of falling: Secondary | ICD-10-CM | POA: Diagnosis not present

## 2023-02-10 DIAGNOSIS — I110 Hypertensive heart disease with heart failure: Secondary | ICD-10-CM | POA: Diagnosis not present

## 2023-02-10 DIAGNOSIS — M25512 Pain in left shoulder: Secondary | ICD-10-CM | POA: Diagnosis not present

## 2023-02-10 DIAGNOSIS — B952 Enterococcus as the cause of diseases classified elsewhere: Secondary | ICD-10-CM | POA: Diagnosis not present

## 2023-02-10 DIAGNOSIS — E109 Type 1 diabetes mellitus without complications: Secondary | ICD-10-CM | POA: Diagnosis not present

## 2023-02-10 DIAGNOSIS — F419 Anxiety disorder, unspecified: Secondary | ICD-10-CM | POA: Diagnosis not present

## 2023-02-10 DIAGNOSIS — M25562 Pain in left knee: Secondary | ICD-10-CM | POA: Diagnosis not present

## 2023-02-10 DIAGNOSIS — I4892 Unspecified atrial flutter: Secondary | ICD-10-CM | POA: Diagnosis not present

## 2023-02-10 DIAGNOSIS — M25511 Pain in right shoulder: Secondary | ICD-10-CM | POA: Diagnosis not present

## 2023-02-10 DIAGNOSIS — H8120 Vestibular neuronitis, unspecified ear: Secondary | ICD-10-CM | POA: Diagnosis not present

## 2023-02-10 DIAGNOSIS — N3281 Overactive bladder: Secondary | ICD-10-CM | POA: Diagnosis not present

## 2023-02-11 DIAGNOSIS — M25562 Pain in left knee: Secondary | ICD-10-CM | POA: Diagnosis not present

## 2023-02-11 DIAGNOSIS — B9620 Unspecified Escherichia coli [E. coli] as the cause of diseases classified elsewhere: Secondary | ICD-10-CM | POA: Diagnosis not present

## 2023-02-11 DIAGNOSIS — N3281 Overactive bladder: Secondary | ICD-10-CM | POA: Diagnosis not present

## 2023-02-11 DIAGNOSIS — M25511 Pain in right shoulder: Secondary | ICD-10-CM | POA: Diagnosis not present

## 2023-02-11 DIAGNOSIS — I5032 Chronic diastolic (congestive) heart failure: Secondary | ICD-10-CM | POA: Diagnosis not present

## 2023-02-11 DIAGNOSIS — M21371 Foot drop, right foot: Secondary | ICD-10-CM | POA: Diagnosis not present

## 2023-02-11 DIAGNOSIS — I110 Hypertensive heart disease with heart failure: Secondary | ICD-10-CM | POA: Diagnosis not present

## 2023-02-11 DIAGNOSIS — H5461 Unqualified visual loss, right eye, normal vision left eye: Secondary | ICD-10-CM | POA: Diagnosis not present

## 2023-02-11 DIAGNOSIS — R32 Unspecified urinary incontinence: Secondary | ICD-10-CM | POA: Diagnosis not present

## 2023-02-11 DIAGNOSIS — I4892 Unspecified atrial flutter: Secondary | ICD-10-CM | POA: Diagnosis not present

## 2023-02-11 DIAGNOSIS — Z7722 Contact with and (suspected) exposure to environmental tobacco smoke (acute) (chronic): Secondary | ICD-10-CM | POA: Diagnosis not present

## 2023-02-11 DIAGNOSIS — Z791 Long term (current) use of non-steroidal anti-inflammatories (NSAID): Secondary | ICD-10-CM | POA: Diagnosis not present

## 2023-02-11 DIAGNOSIS — F419 Anxiety disorder, unspecified: Secondary | ICD-10-CM | POA: Diagnosis not present

## 2023-02-11 DIAGNOSIS — B952 Enterococcus as the cause of diseases classified elsewhere: Secondary | ICD-10-CM | POA: Diagnosis not present

## 2023-02-11 DIAGNOSIS — R392 Extrarenal uremia: Secondary | ICD-10-CM | POA: Diagnosis not present

## 2023-02-11 DIAGNOSIS — E109 Type 1 diabetes mellitus without complications: Secondary | ICD-10-CM | POA: Diagnosis not present

## 2023-02-11 DIAGNOSIS — R5381 Other malaise: Secondary | ICD-10-CM | POA: Diagnosis not present

## 2023-02-11 DIAGNOSIS — Z9181 History of falling: Secondary | ICD-10-CM | POA: Diagnosis not present

## 2023-02-11 DIAGNOSIS — Z7982 Long term (current) use of aspirin: Secondary | ICD-10-CM | POA: Diagnosis not present

## 2023-02-11 DIAGNOSIS — N390 Urinary tract infection, site not specified: Secondary | ICD-10-CM | POA: Diagnosis not present

## 2023-02-11 DIAGNOSIS — M25512 Pain in left shoulder: Secondary | ICD-10-CM | POA: Diagnosis not present

## 2023-02-11 DIAGNOSIS — H8120 Vestibular neuronitis, unspecified ear: Secondary | ICD-10-CM | POA: Diagnosis not present

## 2023-02-11 DIAGNOSIS — I4891 Unspecified atrial fibrillation: Secondary | ICD-10-CM | POA: Diagnosis not present

## 2023-02-11 DIAGNOSIS — H409 Unspecified glaucoma: Secondary | ICD-10-CM | POA: Diagnosis not present

## 2023-02-11 DIAGNOSIS — Q809 Congenital ichthyosis, unspecified: Secondary | ICD-10-CM | POA: Diagnosis not present

## 2023-02-12 DIAGNOSIS — E109 Type 1 diabetes mellitus without complications: Secondary | ICD-10-CM | POA: Diagnosis not present

## 2023-02-12 DIAGNOSIS — I4892 Unspecified atrial flutter: Secondary | ICD-10-CM | POA: Diagnosis not present

## 2023-02-12 DIAGNOSIS — F419 Anxiety disorder, unspecified: Secondary | ICD-10-CM | POA: Diagnosis not present

## 2023-02-12 DIAGNOSIS — Z7982 Long term (current) use of aspirin: Secondary | ICD-10-CM | POA: Diagnosis not present

## 2023-02-12 DIAGNOSIS — Q809 Congenital ichthyosis, unspecified: Secondary | ICD-10-CM | POA: Diagnosis not present

## 2023-02-12 DIAGNOSIS — R392 Extrarenal uremia: Secondary | ICD-10-CM | POA: Diagnosis not present

## 2023-02-12 DIAGNOSIS — M25511 Pain in right shoulder: Secondary | ICD-10-CM | POA: Diagnosis not present

## 2023-02-12 DIAGNOSIS — R32 Unspecified urinary incontinence: Secondary | ICD-10-CM | POA: Diagnosis not present

## 2023-02-12 DIAGNOSIS — Z7722 Contact with and (suspected) exposure to environmental tobacco smoke (acute) (chronic): Secondary | ICD-10-CM | POA: Diagnosis not present

## 2023-02-12 DIAGNOSIS — Z9181 History of falling: Secondary | ICD-10-CM | POA: Diagnosis not present

## 2023-02-12 DIAGNOSIS — B952 Enterococcus as the cause of diseases classified elsewhere: Secondary | ICD-10-CM | POA: Diagnosis not present

## 2023-02-12 DIAGNOSIS — M25512 Pain in left shoulder: Secondary | ICD-10-CM | POA: Diagnosis not present

## 2023-02-12 DIAGNOSIS — Z791 Long term (current) use of non-steroidal anti-inflammatories (NSAID): Secondary | ICD-10-CM | POA: Diagnosis not present

## 2023-02-12 DIAGNOSIS — N3281 Overactive bladder: Secondary | ICD-10-CM | POA: Diagnosis not present

## 2023-02-12 DIAGNOSIS — H8120 Vestibular neuronitis, unspecified ear: Secondary | ICD-10-CM | POA: Diagnosis not present

## 2023-02-12 DIAGNOSIS — H5461 Unqualified visual loss, right eye, normal vision left eye: Secondary | ICD-10-CM | POA: Diagnosis not present

## 2023-02-12 DIAGNOSIS — M21371 Foot drop, right foot: Secondary | ICD-10-CM | POA: Diagnosis not present

## 2023-02-12 DIAGNOSIS — H409 Unspecified glaucoma: Secondary | ICD-10-CM | POA: Diagnosis not present

## 2023-02-12 DIAGNOSIS — I4891 Unspecified atrial fibrillation: Secondary | ICD-10-CM | POA: Diagnosis not present

## 2023-02-12 DIAGNOSIS — I110 Hypertensive heart disease with heart failure: Secondary | ICD-10-CM | POA: Diagnosis not present

## 2023-02-12 DIAGNOSIS — R5381 Other malaise: Secondary | ICD-10-CM | POA: Diagnosis not present

## 2023-02-12 DIAGNOSIS — I5032 Chronic diastolic (congestive) heart failure: Secondary | ICD-10-CM | POA: Diagnosis not present

## 2023-02-12 DIAGNOSIS — N390 Urinary tract infection, site not specified: Secondary | ICD-10-CM | POA: Diagnosis not present

## 2023-02-12 DIAGNOSIS — B9620 Unspecified Escherichia coli [E. coli] as the cause of diseases classified elsewhere: Secondary | ICD-10-CM | POA: Diagnosis not present

## 2023-02-12 DIAGNOSIS — M25562 Pain in left knee: Secondary | ICD-10-CM | POA: Diagnosis not present

## 2023-02-15 ENCOUNTER — Ambulatory Visit: Payer: Medicare Other | Admitting: Cardiology

## 2023-02-16 DIAGNOSIS — I4892 Unspecified atrial flutter: Secondary | ICD-10-CM | POA: Diagnosis not present

## 2023-02-16 DIAGNOSIS — E109 Type 1 diabetes mellitus without complications: Secondary | ICD-10-CM | POA: Diagnosis not present

## 2023-02-16 DIAGNOSIS — M25512 Pain in left shoulder: Secondary | ICD-10-CM | POA: Diagnosis not present

## 2023-02-16 DIAGNOSIS — Z7982 Long term (current) use of aspirin: Secondary | ICD-10-CM | POA: Diagnosis not present

## 2023-02-16 DIAGNOSIS — H8120 Vestibular neuronitis, unspecified ear: Secondary | ICD-10-CM | POA: Diagnosis not present

## 2023-02-16 DIAGNOSIS — I4891 Unspecified atrial fibrillation: Secondary | ICD-10-CM | POA: Diagnosis not present

## 2023-02-16 DIAGNOSIS — Z791 Long term (current) use of non-steroidal anti-inflammatories (NSAID): Secondary | ICD-10-CM | POA: Diagnosis not present

## 2023-02-16 DIAGNOSIS — Z9181 History of falling: Secondary | ICD-10-CM | POA: Diagnosis not present

## 2023-02-16 DIAGNOSIS — F419 Anxiety disorder, unspecified: Secondary | ICD-10-CM | POA: Diagnosis not present

## 2023-02-16 DIAGNOSIS — H5461 Unqualified visual loss, right eye, normal vision left eye: Secondary | ICD-10-CM | POA: Diagnosis not present

## 2023-02-16 DIAGNOSIS — R32 Unspecified urinary incontinence: Secondary | ICD-10-CM | POA: Diagnosis not present

## 2023-02-16 DIAGNOSIS — N3281 Overactive bladder: Secondary | ICD-10-CM | POA: Diagnosis not present

## 2023-02-16 DIAGNOSIS — R392 Extrarenal uremia: Secondary | ICD-10-CM | POA: Diagnosis not present

## 2023-02-16 DIAGNOSIS — Q809 Congenital ichthyosis, unspecified: Secondary | ICD-10-CM | POA: Diagnosis not present

## 2023-02-16 DIAGNOSIS — B952 Enterococcus as the cause of diseases classified elsewhere: Secondary | ICD-10-CM | POA: Diagnosis not present

## 2023-02-16 DIAGNOSIS — Z7722 Contact with and (suspected) exposure to environmental tobacco smoke (acute) (chronic): Secondary | ICD-10-CM | POA: Diagnosis not present

## 2023-02-16 DIAGNOSIS — I5032 Chronic diastolic (congestive) heart failure: Secondary | ICD-10-CM | POA: Diagnosis not present

## 2023-02-16 DIAGNOSIS — M21371 Foot drop, right foot: Secondary | ICD-10-CM | POA: Diagnosis not present

## 2023-02-16 DIAGNOSIS — M25562 Pain in left knee: Secondary | ICD-10-CM | POA: Diagnosis not present

## 2023-02-16 DIAGNOSIS — M25511 Pain in right shoulder: Secondary | ICD-10-CM | POA: Diagnosis not present

## 2023-02-16 DIAGNOSIS — R5381 Other malaise: Secondary | ICD-10-CM | POA: Diagnosis not present

## 2023-02-16 DIAGNOSIS — N390 Urinary tract infection, site not specified: Secondary | ICD-10-CM | POA: Diagnosis not present

## 2023-02-16 DIAGNOSIS — B9620 Unspecified Escherichia coli [E. coli] as the cause of diseases classified elsewhere: Secondary | ICD-10-CM | POA: Diagnosis not present

## 2023-02-16 DIAGNOSIS — H409 Unspecified glaucoma: Secondary | ICD-10-CM | POA: Diagnosis not present

## 2023-02-16 DIAGNOSIS — I110 Hypertensive heart disease with heart failure: Secondary | ICD-10-CM | POA: Diagnosis not present

## 2023-02-18 DIAGNOSIS — H409 Unspecified glaucoma: Secondary | ICD-10-CM | POA: Diagnosis not present

## 2023-02-18 DIAGNOSIS — R392 Extrarenal uremia: Secondary | ICD-10-CM | POA: Diagnosis not present

## 2023-02-18 DIAGNOSIS — R5381 Other malaise: Secondary | ICD-10-CM | POA: Diagnosis not present

## 2023-02-18 DIAGNOSIS — R32 Unspecified urinary incontinence: Secondary | ICD-10-CM | POA: Diagnosis not present

## 2023-02-18 DIAGNOSIS — N3281 Overactive bladder: Secondary | ICD-10-CM | POA: Diagnosis not present

## 2023-02-18 DIAGNOSIS — I110 Hypertensive heart disease with heart failure: Secondary | ICD-10-CM | POA: Diagnosis not present

## 2023-02-18 DIAGNOSIS — M25512 Pain in left shoulder: Secondary | ICD-10-CM | POA: Diagnosis not present

## 2023-02-18 DIAGNOSIS — M21371 Foot drop, right foot: Secondary | ICD-10-CM | POA: Diagnosis not present

## 2023-02-18 DIAGNOSIS — I4892 Unspecified atrial flutter: Secondary | ICD-10-CM | POA: Diagnosis not present

## 2023-02-18 DIAGNOSIS — H5461 Unqualified visual loss, right eye, normal vision left eye: Secondary | ICD-10-CM | POA: Diagnosis not present

## 2023-02-18 DIAGNOSIS — M25511 Pain in right shoulder: Secondary | ICD-10-CM | POA: Diagnosis not present

## 2023-02-18 DIAGNOSIS — B952 Enterococcus as the cause of diseases classified elsewhere: Secondary | ICD-10-CM | POA: Diagnosis not present

## 2023-02-18 DIAGNOSIS — B9620 Unspecified Escherichia coli [E. coli] as the cause of diseases classified elsewhere: Secondary | ICD-10-CM | POA: Diagnosis not present

## 2023-02-18 DIAGNOSIS — Q809 Congenital ichthyosis, unspecified: Secondary | ICD-10-CM | POA: Diagnosis not present

## 2023-02-18 DIAGNOSIS — I4891 Unspecified atrial fibrillation: Secondary | ICD-10-CM | POA: Diagnosis not present

## 2023-02-18 DIAGNOSIS — F419 Anxiety disorder, unspecified: Secondary | ICD-10-CM | POA: Diagnosis not present

## 2023-02-18 DIAGNOSIS — Z9181 History of falling: Secondary | ICD-10-CM | POA: Diagnosis not present

## 2023-02-18 DIAGNOSIS — N390 Urinary tract infection, site not specified: Secondary | ICD-10-CM | POA: Diagnosis not present

## 2023-02-18 DIAGNOSIS — M25562 Pain in left knee: Secondary | ICD-10-CM | POA: Diagnosis not present

## 2023-02-18 DIAGNOSIS — Z7722 Contact with and (suspected) exposure to environmental tobacco smoke (acute) (chronic): Secondary | ICD-10-CM | POA: Diagnosis not present

## 2023-02-18 DIAGNOSIS — Z7982 Long term (current) use of aspirin: Secondary | ICD-10-CM | POA: Diagnosis not present

## 2023-02-18 DIAGNOSIS — E109 Type 1 diabetes mellitus without complications: Secondary | ICD-10-CM | POA: Diagnosis not present

## 2023-02-18 DIAGNOSIS — Z791 Long term (current) use of non-steroidal anti-inflammatories (NSAID): Secondary | ICD-10-CM | POA: Diagnosis not present

## 2023-02-18 DIAGNOSIS — I5032 Chronic diastolic (congestive) heart failure: Secondary | ICD-10-CM | POA: Diagnosis not present

## 2023-02-18 DIAGNOSIS — H8120 Vestibular neuronitis, unspecified ear: Secondary | ICD-10-CM | POA: Diagnosis not present

## 2023-02-20 DIAGNOSIS — B952 Enterococcus as the cause of diseases classified elsewhere: Secondary | ICD-10-CM | POA: Diagnosis not present

## 2023-02-20 DIAGNOSIS — M25512 Pain in left shoulder: Secondary | ICD-10-CM | POA: Diagnosis not present

## 2023-02-20 DIAGNOSIS — H5461 Unqualified visual loss, right eye, normal vision left eye: Secondary | ICD-10-CM | POA: Diagnosis not present

## 2023-02-20 DIAGNOSIS — R392 Extrarenal uremia: Secondary | ICD-10-CM | POA: Diagnosis not present

## 2023-02-20 DIAGNOSIS — B9620 Unspecified Escherichia coli [E. coli] as the cause of diseases classified elsewhere: Secondary | ICD-10-CM | POA: Diagnosis not present

## 2023-02-20 DIAGNOSIS — R5381 Other malaise: Secondary | ICD-10-CM | POA: Diagnosis not present

## 2023-02-20 DIAGNOSIS — N3281 Overactive bladder: Secondary | ICD-10-CM | POA: Diagnosis not present

## 2023-02-20 DIAGNOSIS — R32 Unspecified urinary incontinence: Secondary | ICD-10-CM | POA: Diagnosis not present

## 2023-02-20 DIAGNOSIS — F419 Anxiety disorder, unspecified: Secondary | ICD-10-CM | POA: Diagnosis not present

## 2023-02-20 DIAGNOSIS — H8120 Vestibular neuronitis, unspecified ear: Secondary | ICD-10-CM | POA: Diagnosis not present

## 2023-02-20 DIAGNOSIS — Z7722 Contact with and (suspected) exposure to environmental tobacco smoke (acute) (chronic): Secondary | ICD-10-CM | POA: Diagnosis not present

## 2023-02-20 DIAGNOSIS — E109 Type 1 diabetes mellitus without complications: Secondary | ICD-10-CM | POA: Diagnosis not present

## 2023-02-20 DIAGNOSIS — Q809 Congenital ichthyosis, unspecified: Secondary | ICD-10-CM | POA: Diagnosis not present

## 2023-02-20 DIAGNOSIS — Z791 Long term (current) use of non-steroidal anti-inflammatories (NSAID): Secondary | ICD-10-CM | POA: Diagnosis not present

## 2023-02-20 DIAGNOSIS — I5032 Chronic diastolic (congestive) heart failure: Secondary | ICD-10-CM | POA: Diagnosis not present

## 2023-02-20 DIAGNOSIS — Z7982 Long term (current) use of aspirin: Secondary | ICD-10-CM | POA: Diagnosis not present

## 2023-02-20 DIAGNOSIS — M25562 Pain in left knee: Secondary | ICD-10-CM | POA: Diagnosis not present

## 2023-02-20 DIAGNOSIS — I4891 Unspecified atrial fibrillation: Secondary | ICD-10-CM | POA: Diagnosis not present

## 2023-02-20 DIAGNOSIS — I110 Hypertensive heart disease with heart failure: Secondary | ICD-10-CM | POA: Diagnosis not present

## 2023-02-20 DIAGNOSIS — M21371 Foot drop, right foot: Secondary | ICD-10-CM | POA: Diagnosis not present

## 2023-02-20 DIAGNOSIS — H409 Unspecified glaucoma: Secondary | ICD-10-CM | POA: Diagnosis not present

## 2023-02-20 DIAGNOSIS — I4892 Unspecified atrial flutter: Secondary | ICD-10-CM | POA: Diagnosis not present

## 2023-02-20 DIAGNOSIS — M25511 Pain in right shoulder: Secondary | ICD-10-CM | POA: Diagnosis not present

## 2023-02-20 DIAGNOSIS — N390 Urinary tract infection, site not specified: Secondary | ICD-10-CM | POA: Diagnosis not present

## 2023-02-20 DIAGNOSIS — Z9181 History of falling: Secondary | ICD-10-CM | POA: Diagnosis not present

## 2023-02-22 DIAGNOSIS — I4891 Unspecified atrial fibrillation: Secondary | ICD-10-CM | POA: Diagnosis not present

## 2023-02-22 DIAGNOSIS — Q809 Congenital ichthyosis, unspecified: Secondary | ICD-10-CM | POA: Diagnosis not present

## 2023-02-22 DIAGNOSIS — B9620 Unspecified Escherichia coli [E. coli] as the cause of diseases classified elsewhere: Secondary | ICD-10-CM | POA: Diagnosis not present

## 2023-02-22 DIAGNOSIS — F419 Anxiety disorder, unspecified: Secondary | ICD-10-CM | POA: Diagnosis not present

## 2023-02-22 DIAGNOSIS — I5032 Chronic diastolic (congestive) heart failure: Secondary | ICD-10-CM | POA: Diagnosis not present

## 2023-02-22 DIAGNOSIS — Z791 Long term (current) use of non-steroidal anti-inflammatories (NSAID): Secondary | ICD-10-CM | POA: Diagnosis not present

## 2023-02-22 DIAGNOSIS — E109 Type 1 diabetes mellitus without complications: Secondary | ICD-10-CM | POA: Diagnosis not present

## 2023-02-22 DIAGNOSIS — M25562 Pain in left knee: Secondary | ICD-10-CM | POA: Diagnosis not present

## 2023-02-22 DIAGNOSIS — H409 Unspecified glaucoma: Secondary | ICD-10-CM | POA: Diagnosis not present

## 2023-02-22 DIAGNOSIS — Z7982 Long term (current) use of aspirin: Secondary | ICD-10-CM | POA: Diagnosis not present

## 2023-02-22 DIAGNOSIS — H8120 Vestibular neuronitis, unspecified ear: Secondary | ICD-10-CM | POA: Diagnosis not present

## 2023-02-22 DIAGNOSIS — B952 Enterococcus as the cause of diseases classified elsewhere: Secondary | ICD-10-CM | POA: Diagnosis not present

## 2023-02-22 DIAGNOSIS — I4892 Unspecified atrial flutter: Secondary | ICD-10-CM | POA: Diagnosis not present

## 2023-02-22 DIAGNOSIS — R392 Extrarenal uremia: Secondary | ICD-10-CM | POA: Diagnosis not present

## 2023-02-22 DIAGNOSIS — M21371 Foot drop, right foot: Secondary | ICD-10-CM | POA: Diagnosis not present

## 2023-02-22 DIAGNOSIS — N3281 Overactive bladder: Secondary | ICD-10-CM | POA: Diagnosis not present

## 2023-02-22 DIAGNOSIS — R5381 Other malaise: Secondary | ICD-10-CM | POA: Diagnosis not present

## 2023-02-22 DIAGNOSIS — M25512 Pain in left shoulder: Secondary | ICD-10-CM | POA: Diagnosis not present

## 2023-02-22 DIAGNOSIS — I110 Hypertensive heart disease with heart failure: Secondary | ICD-10-CM | POA: Diagnosis not present

## 2023-02-22 DIAGNOSIS — R32 Unspecified urinary incontinence: Secondary | ICD-10-CM | POA: Diagnosis not present

## 2023-02-22 DIAGNOSIS — H5461 Unqualified visual loss, right eye, normal vision left eye: Secondary | ICD-10-CM | POA: Diagnosis not present

## 2023-02-22 DIAGNOSIS — N390 Urinary tract infection, site not specified: Secondary | ICD-10-CM | POA: Diagnosis not present

## 2023-02-22 DIAGNOSIS — Z9181 History of falling: Secondary | ICD-10-CM | POA: Diagnosis not present

## 2023-02-22 DIAGNOSIS — Z7722 Contact with and (suspected) exposure to environmental tobacco smoke (acute) (chronic): Secondary | ICD-10-CM | POA: Diagnosis not present

## 2023-02-22 DIAGNOSIS — M25511 Pain in right shoulder: Secondary | ICD-10-CM | POA: Diagnosis not present

## 2023-02-23 DIAGNOSIS — H5461 Unqualified visual loss, right eye, normal vision left eye: Secondary | ICD-10-CM | POA: Diagnosis not present

## 2023-02-23 DIAGNOSIS — I110 Hypertensive heart disease with heart failure: Secondary | ICD-10-CM | POA: Diagnosis not present

## 2023-02-23 DIAGNOSIS — N3281 Overactive bladder: Secondary | ICD-10-CM | POA: Diagnosis not present

## 2023-02-23 DIAGNOSIS — M25562 Pain in left knee: Secondary | ICD-10-CM | POA: Diagnosis not present

## 2023-02-23 DIAGNOSIS — M25511 Pain in right shoulder: Secondary | ICD-10-CM | POA: Diagnosis not present

## 2023-02-23 DIAGNOSIS — H8120 Vestibular neuronitis, unspecified ear: Secondary | ICD-10-CM | POA: Diagnosis not present

## 2023-02-23 DIAGNOSIS — Q809 Congenital ichthyosis, unspecified: Secondary | ICD-10-CM | POA: Diagnosis not present

## 2023-02-23 DIAGNOSIS — R392 Extrarenal uremia: Secondary | ICD-10-CM | POA: Diagnosis not present

## 2023-02-23 DIAGNOSIS — B9620 Unspecified Escherichia coli [E. coli] as the cause of diseases classified elsewhere: Secondary | ICD-10-CM | POA: Diagnosis not present

## 2023-02-23 DIAGNOSIS — I4891 Unspecified atrial fibrillation: Secondary | ICD-10-CM | POA: Diagnosis not present

## 2023-02-23 DIAGNOSIS — R5381 Other malaise: Secondary | ICD-10-CM | POA: Diagnosis not present

## 2023-02-23 DIAGNOSIS — Z791 Long term (current) use of non-steroidal anti-inflammatories (NSAID): Secondary | ICD-10-CM | POA: Diagnosis not present

## 2023-02-23 DIAGNOSIS — M25512 Pain in left shoulder: Secondary | ICD-10-CM | POA: Diagnosis not present

## 2023-02-23 DIAGNOSIS — H409 Unspecified glaucoma: Secondary | ICD-10-CM | POA: Diagnosis not present

## 2023-02-23 DIAGNOSIS — I4892 Unspecified atrial flutter: Secondary | ICD-10-CM | POA: Diagnosis not present

## 2023-02-23 DIAGNOSIS — F419 Anxiety disorder, unspecified: Secondary | ICD-10-CM | POA: Diagnosis not present

## 2023-02-23 DIAGNOSIS — B952 Enterococcus as the cause of diseases classified elsewhere: Secondary | ICD-10-CM | POA: Diagnosis not present

## 2023-02-23 DIAGNOSIS — R32 Unspecified urinary incontinence: Secondary | ICD-10-CM | POA: Diagnosis not present

## 2023-02-23 DIAGNOSIS — Z9181 History of falling: Secondary | ICD-10-CM | POA: Diagnosis not present

## 2023-02-23 DIAGNOSIS — E109 Type 1 diabetes mellitus without complications: Secondary | ICD-10-CM | POA: Diagnosis not present

## 2023-02-23 DIAGNOSIS — Z7722 Contact with and (suspected) exposure to environmental tobacco smoke (acute) (chronic): Secondary | ICD-10-CM | POA: Diagnosis not present

## 2023-02-23 DIAGNOSIS — M21371 Foot drop, right foot: Secondary | ICD-10-CM | POA: Diagnosis not present

## 2023-02-23 DIAGNOSIS — I5032 Chronic diastolic (congestive) heart failure: Secondary | ICD-10-CM | POA: Diagnosis not present

## 2023-02-23 DIAGNOSIS — Z7982 Long term (current) use of aspirin: Secondary | ICD-10-CM | POA: Diagnosis not present

## 2023-02-23 DIAGNOSIS — N390 Urinary tract infection, site not specified: Secondary | ICD-10-CM | POA: Diagnosis not present

## 2023-02-24 DIAGNOSIS — R32 Unspecified urinary incontinence: Secondary | ICD-10-CM | POA: Diagnosis not present

## 2023-02-24 DIAGNOSIS — B9620 Unspecified Escherichia coli [E. coli] as the cause of diseases classified elsewhere: Secondary | ICD-10-CM | POA: Diagnosis not present

## 2023-02-24 DIAGNOSIS — R5381 Other malaise: Secondary | ICD-10-CM | POA: Diagnosis not present

## 2023-02-24 DIAGNOSIS — I4891 Unspecified atrial fibrillation: Secondary | ICD-10-CM | POA: Diagnosis not present

## 2023-02-24 DIAGNOSIS — N3281 Overactive bladder: Secondary | ICD-10-CM | POA: Diagnosis not present

## 2023-02-24 DIAGNOSIS — Z9181 History of falling: Secondary | ICD-10-CM | POA: Diagnosis not present

## 2023-02-24 DIAGNOSIS — M25562 Pain in left knee: Secondary | ICD-10-CM | POA: Diagnosis not present

## 2023-02-24 DIAGNOSIS — B952 Enterococcus as the cause of diseases classified elsewhere: Secondary | ICD-10-CM | POA: Diagnosis not present

## 2023-02-24 DIAGNOSIS — I5032 Chronic diastolic (congestive) heart failure: Secondary | ICD-10-CM | POA: Diagnosis not present

## 2023-02-24 DIAGNOSIS — F419 Anxiety disorder, unspecified: Secondary | ICD-10-CM | POA: Diagnosis not present

## 2023-02-24 DIAGNOSIS — H5461 Unqualified visual loss, right eye, normal vision left eye: Secondary | ICD-10-CM | POA: Diagnosis not present

## 2023-02-24 DIAGNOSIS — Z791 Long term (current) use of non-steroidal anti-inflammatories (NSAID): Secondary | ICD-10-CM | POA: Diagnosis not present

## 2023-02-24 DIAGNOSIS — N390 Urinary tract infection, site not specified: Secondary | ICD-10-CM | POA: Diagnosis not present

## 2023-02-24 DIAGNOSIS — Z7982 Long term (current) use of aspirin: Secondary | ICD-10-CM | POA: Diagnosis not present

## 2023-02-24 DIAGNOSIS — Z7722 Contact with and (suspected) exposure to environmental tobacco smoke (acute) (chronic): Secondary | ICD-10-CM | POA: Diagnosis not present

## 2023-02-24 DIAGNOSIS — R392 Extrarenal uremia: Secondary | ICD-10-CM | POA: Diagnosis not present

## 2023-02-24 DIAGNOSIS — H8120 Vestibular neuronitis, unspecified ear: Secondary | ICD-10-CM | POA: Diagnosis not present

## 2023-02-24 DIAGNOSIS — M25512 Pain in left shoulder: Secondary | ICD-10-CM | POA: Diagnosis not present

## 2023-02-24 DIAGNOSIS — M21371 Foot drop, right foot: Secondary | ICD-10-CM | POA: Diagnosis not present

## 2023-02-24 DIAGNOSIS — M25511 Pain in right shoulder: Secondary | ICD-10-CM | POA: Diagnosis not present

## 2023-02-24 DIAGNOSIS — E109 Type 1 diabetes mellitus without complications: Secondary | ICD-10-CM | POA: Diagnosis not present

## 2023-02-24 DIAGNOSIS — Q809 Congenital ichthyosis, unspecified: Secondary | ICD-10-CM | POA: Diagnosis not present

## 2023-02-24 DIAGNOSIS — I4892 Unspecified atrial flutter: Secondary | ICD-10-CM | POA: Diagnosis not present

## 2023-02-24 DIAGNOSIS — H409 Unspecified glaucoma: Secondary | ICD-10-CM | POA: Diagnosis not present

## 2023-02-24 DIAGNOSIS — I110 Hypertensive heart disease with heart failure: Secondary | ICD-10-CM | POA: Diagnosis not present

## 2023-02-25 DIAGNOSIS — N390 Urinary tract infection, site not specified: Secondary | ICD-10-CM | POA: Diagnosis not present

## 2023-02-25 DIAGNOSIS — H8120 Vestibular neuronitis, unspecified ear: Secondary | ICD-10-CM | POA: Diagnosis not present

## 2023-02-25 DIAGNOSIS — M25511 Pain in right shoulder: Secondary | ICD-10-CM | POA: Diagnosis not present

## 2023-02-25 DIAGNOSIS — M25562 Pain in left knee: Secondary | ICD-10-CM | POA: Diagnosis not present

## 2023-02-25 DIAGNOSIS — Z791 Long term (current) use of non-steroidal anti-inflammatories (NSAID): Secondary | ICD-10-CM | POA: Diagnosis not present

## 2023-02-25 DIAGNOSIS — I5032 Chronic diastolic (congestive) heart failure: Secondary | ICD-10-CM | POA: Diagnosis not present

## 2023-02-25 DIAGNOSIS — F419 Anxiety disorder, unspecified: Secondary | ICD-10-CM | POA: Diagnosis not present

## 2023-02-25 DIAGNOSIS — R392 Extrarenal uremia: Secondary | ICD-10-CM | POA: Diagnosis not present

## 2023-02-25 DIAGNOSIS — E109 Type 1 diabetes mellitus without complications: Secondary | ICD-10-CM | POA: Diagnosis not present

## 2023-02-25 DIAGNOSIS — I4892 Unspecified atrial flutter: Secondary | ICD-10-CM | POA: Diagnosis not present

## 2023-02-25 DIAGNOSIS — B952 Enterococcus as the cause of diseases classified elsewhere: Secondary | ICD-10-CM | POA: Diagnosis not present

## 2023-02-25 DIAGNOSIS — I110 Hypertensive heart disease with heart failure: Secondary | ICD-10-CM | POA: Diagnosis not present

## 2023-02-25 DIAGNOSIS — Z7722 Contact with and (suspected) exposure to environmental tobacco smoke (acute) (chronic): Secondary | ICD-10-CM | POA: Diagnosis not present

## 2023-02-25 DIAGNOSIS — Z7982 Long term (current) use of aspirin: Secondary | ICD-10-CM | POA: Diagnosis not present

## 2023-02-25 DIAGNOSIS — B9620 Unspecified Escherichia coli [E. coli] as the cause of diseases classified elsewhere: Secondary | ICD-10-CM | POA: Diagnosis not present

## 2023-02-25 DIAGNOSIS — M25512 Pain in left shoulder: Secondary | ICD-10-CM | POA: Diagnosis not present

## 2023-02-25 DIAGNOSIS — I4891 Unspecified atrial fibrillation: Secondary | ICD-10-CM | POA: Diagnosis not present

## 2023-02-25 DIAGNOSIS — N3281 Overactive bladder: Secondary | ICD-10-CM | POA: Diagnosis not present

## 2023-02-25 DIAGNOSIS — R5381 Other malaise: Secondary | ICD-10-CM | POA: Diagnosis not present

## 2023-02-25 DIAGNOSIS — H409 Unspecified glaucoma: Secondary | ICD-10-CM | POA: Diagnosis not present

## 2023-02-25 DIAGNOSIS — Z9181 History of falling: Secondary | ICD-10-CM | POA: Diagnosis not present

## 2023-02-25 DIAGNOSIS — R32 Unspecified urinary incontinence: Secondary | ICD-10-CM | POA: Diagnosis not present

## 2023-02-25 DIAGNOSIS — H5461 Unqualified visual loss, right eye, normal vision left eye: Secondary | ICD-10-CM | POA: Diagnosis not present

## 2023-02-25 DIAGNOSIS — M21371 Foot drop, right foot: Secondary | ICD-10-CM | POA: Diagnosis not present

## 2023-02-25 DIAGNOSIS — Q809 Congenital ichthyosis, unspecified: Secondary | ICD-10-CM | POA: Diagnosis not present

## 2023-03-01 DIAGNOSIS — M25562 Pain in left knee: Secondary | ICD-10-CM | POA: Diagnosis not present

## 2023-03-01 DIAGNOSIS — N390 Urinary tract infection, site not specified: Secondary | ICD-10-CM | POA: Diagnosis not present

## 2023-03-01 DIAGNOSIS — M25511 Pain in right shoulder: Secondary | ICD-10-CM | POA: Diagnosis not present

## 2023-03-01 DIAGNOSIS — Z9181 History of falling: Secondary | ICD-10-CM | POA: Diagnosis not present

## 2023-03-01 DIAGNOSIS — H409 Unspecified glaucoma: Secondary | ICD-10-CM | POA: Diagnosis not present

## 2023-03-01 DIAGNOSIS — R32 Unspecified urinary incontinence: Secondary | ICD-10-CM | POA: Diagnosis not present

## 2023-03-01 DIAGNOSIS — R392 Extrarenal uremia: Secondary | ICD-10-CM | POA: Diagnosis not present

## 2023-03-01 DIAGNOSIS — F419 Anxiety disorder, unspecified: Secondary | ICD-10-CM | POA: Diagnosis not present

## 2023-03-01 DIAGNOSIS — Z7982 Long term (current) use of aspirin: Secondary | ICD-10-CM | POA: Diagnosis not present

## 2023-03-01 DIAGNOSIS — I4892 Unspecified atrial flutter: Secondary | ICD-10-CM | POA: Diagnosis not present

## 2023-03-01 DIAGNOSIS — M21371 Foot drop, right foot: Secondary | ICD-10-CM | POA: Diagnosis not present

## 2023-03-01 DIAGNOSIS — H8120 Vestibular neuronitis, unspecified ear: Secondary | ICD-10-CM | POA: Diagnosis not present

## 2023-03-01 DIAGNOSIS — B952 Enterococcus as the cause of diseases classified elsewhere: Secondary | ICD-10-CM | POA: Diagnosis not present

## 2023-03-01 DIAGNOSIS — Z7722 Contact with and (suspected) exposure to environmental tobacco smoke (acute) (chronic): Secondary | ICD-10-CM | POA: Diagnosis not present

## 2023-03-01 DIAGNOSIS — N3281 Overactive bladder: Secondary | ICD-10-CM | POA: Diagnosis not present

## 2023-03-01 DIAGNOSIS — E109 Type 1 diabetes mellitus without complications: Secondary | ICD-10-CM | POA: Diagnosis not present

## 2023-03-01 DIAGNOSIS — R5381 Other malaise: Secondary | ICD-10-CM | POA: Diagnosis not present

## 2023-03-01 DIAGNOSIS — Q809 Congenital ichthyosis, unspecified: Secondary | ICD-10-CM | POA: Diagnosis not present

## 2023-03-01 DIAGNOSIS — B9620 Unspecified Escherichia coli [E. coli] as the cause of diseases classified elsewhere: Secondary | ICD-10-CM | POA: Diagnosis not present

## 2023-03-01 DIAGNOSIS — M25512 Pain in left shoulder: Secondary | ICD-10-CM | POA: Diagnosis not present

## 2023-03-01 DIAGNOSIS — I5032 Chronic diastolic (congestive) heart failure: Secondary | ICD-10-CM | POA: Diagnosis not present

## 2023-03-01 DIAGNOSIS — Z791 Long term (current) use of non-steroidal anti-inflammatories (NSAID): Secondary | ICD-10-CM | POA: Diagnosis not present

## 2023-03-01 DIAGNOSIS — I110 Hypertensive heart disease with heart failure: Secondary | ICD-10-CM | POA: Diagnosis not present

## 2023-03-01 DIAGNOSIS — I4891 Unspecified atrial fibrillation: Secondary | ICD-10-CM | POA: Diagnosis not present

## 2023-03-01 DIAGNOSIS — H5461 Unqualified visual loss, right eye, normal vision left eye: Secondary | ICD-10-CM | POA: Diagnosis not present

## 2023-03-02 DIAGNOSIS — H409 Unspecified glaucoma: Secondary | ICD-10-CM | POA: Diagnosis not present

## 2023-03-02 DIAGNOSIS — I110 Hypertensive heart disease with heart failure: Secondary | ICD-10-CM | POA: Diagnosis not present

## 2023-03-02 DIAGNOSIS — N390 Urinary tract infection, site not specified: Secondary | ICD-10-CM | POA: Diagnosis not present

## 2023-03-02 DIAGNOSIS — R32 Unspecified urinary incontinence: Secondary | ICD-10-CM | POA: Diagnosis not present

## 2023-03-02 DIAGNOSIS — M25511 Pain in right shoulder: Secondary | ICD-10-CM | POA: Diagnosis not present

## 2023-03-02 DIAGNOSIS — Z7982 Long term (current) use of aspirin: Secondary | ICD-10-CM | POA: Diagnosis not present

## 2023-03-02 DIAGNOSIS — N3281 Overactive bladder: Secondary | ICD-10-CM | POA: Diagnosis not present

## 2023-03-02 DIAGNOSIS — B952 Enterococcus as the cause of diseases classified elsewhere: Secondary | ICD-10-CM | POA: Diagnosis not present

## 2023-03-02 DIAGNOSIS — M25562 Pain in left knee: Secondary | ICD-10-CM | POA: Diagnosis not present

## 2023-03-02 DIAGNOSIS — Z7722 Contact with and (suspected) exposure to environmental tobacco smoke (acute) (chronic): Secondary | ICD-10-CM | POA: Diagnosis not present

## 2023-03-02 DIAGNOSIS — I4891 Unspecified atrial fibrillation: Secondary | ICD-10-CM | POA: Diagnosis not present

## 2023-03-02 DIAGNOSIS — Z9181 History of falling: Secondary | ICD-10-CM | POA: Diagnosis not present

## 2023-03-02 DIAGNOSIS — I5032 Chronic diastolic (congestive) heart failure: Secondary | ICD-10-CM | POA: Diagnosis not present

## 2023-03-02 DIAGNOSIS — M21371 Foot drop, right foot: Secondary | ICD-10-CM | POA: Diagnosis not present

## 2023-03-02 DIAGNOSIS — B9620 Unspecified Escherichia coli [E. coli] as the cause of diseases classified elsewhere: Secondary | ICD-10-CM | POA: Diagnosis not present

## 2023-03-02 DIAGNOSIS — Q809 Congenital ichthyosis, unspecified: Secondary | ICD-10-CM | POA: Diagnosis not present

## 2023-03-02 DIAGNOSIS — E109 Type 1 diabetes mellitus without complications: Secondary | ICD-10-CM | POA: Diagnosis not present

## 2023-03-02 DIAGNOSIS — Z791 Long term (current) use of non-steroidal anti-inflammatories (NSAID): Secondary | ICD-10-CM | POA: Diagnosis not present

## 2023-03-02 DIAGNOSIS — I4892 Unspecified atrial flutter: Secondary | ICD-10-CM | POA: Diagnosis not present

## 2023-03-02 DIAGNOSIS — F419 Anxiety disorder, unspecified: Secondary | ICD-10-CM | POA: Diagnosis not present

## 2023-03-02 DIAGNOSIS — M25512 Pain in left shoulder: Secondary | ICD-10-CM | POA: Diagnosis not present

## 2023-03-02 DIAGNOSIS — H8120 Vestibular neuronitis, unspecified ear: Secondary | ICD-10-CM | POA: Diagnosis not present

## 2023-03-02 DIAGNOSIS — H5461 Unqualified visual loss, right eye, normal vision left eye: Secondary | ICD-10-CM | POA: Diagnosis not present

## 2023-03-02 DIAGNOSIS — R5381 Other malaise: Secondary | ICD-10-CM | POA: Diagnosis not present

## 2023-03-02 DIAGNOSIS — R392 Extrarenal uremia: Secondary | ICD-10-CM | POA: Diagnosis not present

## 2023-03-03 DIAGNOSIS — Z791 Long term (current) use of non-steroidal anti-inflammatories (NSAID): Secondary | ICD-10-CM | POA: Diagnosis not present

## 2023-03-03 DIAGNOSIS — N390 Urinary tract infection, site not specified: Secondary | ICD-10-CM | POA: Diagnosis not present

## 2023-03-03 DIAGNOSIS — R5381 Other malaise: Secondary | ICD-10-CM | POA: Diagnosis not present

## 2023-03-03 DIAGNOSIS — E109 Type 1 diabetes mellitus without complications: Secondary | ICD-10-CM | POA: Diagnosis not present

## 2023-03-03 DIAGNOSIS — I5032 Chronic diastolic (congestive) heart failure: Secondary | ICD-10-CM | POA: Diagnosis not present

## 2023-03-03 DIAGNOSIS — H8120 Vestibular neuronitis, unspecified ear: Secondary | ICD-10-CM | POA: Diagnosis not present

## 2023-03-03 DIAGNOSIS — Z7982 Long term (current) use of aspirin: Secondary | ICD-10-CM | POA: Diagnosis not present

## 2023-03-03 DIAGNOSIS — Q809 Congenital ichthyosis, unspecified: Secondary | ICD-10-CM | POA: Diagnosis not present

## 2023-03-03 DIAGNOSIS — I4892 Unspecified atrial flutter: Secondary | ICD-10-CM | POA: Diagnosis not present

## 2023-03-03 DIAGNOSIS — M25512 Pain in left shoulder: Secondary | ICD-10-CM | POA: Diagnosis not present

## 2023-03-03 DIAGNOSIS — R392 Extrarenal uremia: Secondary | ICD-10-CM | POA: Diagnosis not present

## 2023-03-03 DIAGNOSIS — M25562 Pain in left knee: Secondary | ICD-10-CM | POA: Diagnosis not present

## 2023-03-03 DIAGNOSIS — I4891 Unspecified atrial fibrillation: Secondary | ICD-10-CM | POA: Diagnosis not present

## 2023-03-03 DIAGNOSIS — H5461 Unqualified visual loss, right eye, normal vision left eye: Secondary | ICD-10-CM | POA: Diagnosis not present

## 2023-03-03 DIAGNOSIS — R32 Unspecified urinary incontinence: Secondary | ICD-10-CM | POA: Diagnosis not present

## 2023-03-03 DIAGNOSIS — M21371 Foot drop, right foot: Secondary | ICD-10-CM | POA: Diagnosis not present

## 2023-03-03 DIAGNOSIS — Z7722 Contact with and (suspected) exposure to environmental tobacco smoke (acute) (chronic): Secondary | ICD-10-CM | POA: Diagnosis not present

## 2023-03-03 DIAGNOSIS — I110 Hypertensive heart disease with heart failure: Secondary | ICD-10-CM | POA: Diagnosis not present

## 2023-03-03 DIAGNOSIS — Z9181 History of falling: Secondary | ICD-10-CM | POA: Diagnosis not present

## 2023-03-03 DIAGNOSIS — B952 Enterococcus as the cause of diseases classified elsewhere: Secondary | ICD-10-CM | POA: Diagnosis not present

## 2023-03-03 DIAGNOSIS — F419 Anxiety disorder, unspecified: Secondary | ICD-10-CM | POA: Diagnosis not present

## 2023-03-03 DIAGNOSIS — N3281 Overactive bladder: Secondary | ICD-10-CM | POA: Diagnosis not present

## 2023-03-03 DIAGNOSIS — M25511 Pain in right shoulder: Secondary | ICD-10-CM | POA: Diagnosis not present

## 2023-03-03 DIAGNOSIS — H409 Unspecified glaucoma: Secondary | ICD-10-CM | POA: Diagnosis not present

## 2023-03-03 DIAGNOSIS — B9620 Unspecified Escherichia coli [E. coli] as the cause of diseases classified elsewhere: Secondary | ICD-10-CM | POA: Diagnosis not present

## 2023-03-04 DIAGNOSIS — E109 Type 1 diabetes mellitus without complications: Secondary | ICD-10-CM | POA: Diagnosis not present

## 2023-03-04 DIAGNOSIS — R392 Extrarenal uremia: Secondary | ICD-10-CM | POA: Diagnosis not present

## 2023-03-04 DIAGNOSIS — Z7982 Long term (current) use of aspirin: Secondary | ICD-10-CM | POA: Diagnosis not present

## 2023-03-04 DIAGNOSIS — I110 Hypertensive heart disease with heart failure: Secondary | ICD-10-CM | POA: Diagnosis not present

## 2023-03-04 DIAGNOSIS — M25511 Pain in right shoulder: Secondary | ICD-10-CM | POA: Diagnosis not present

## 2023-03-04 DIAGNOSIS — H8120 Vestibular neuronitis, unspecified ear: Secondary | ICD-10-CM | POA: Diagnosis not present

## 2023-03-04 DIAGNOSIS — R5381 Other malaise: Secondary | ICD-10-CM | POA: Diagnosis not present

## 2023-03-04 DIAGNOSIS — F419 Anxiety disorder, unspecified: Secondary | ICD-10-CM | POA: Diagnosis not present

## 2023-03-04 DIAGNOSIS — I4892 Unspecified atrial flutter: Secondary | ICD-10-CM | POA: Diagnosis not present

## 2023-03-04 DIAGNOSIS — M25562 Pain in left knee: Secondary | ICD-10-CM | POA: Diagnosis not present

## 2023-03-04 DIAGNOSIS — B952 Enterococcus as the cause of diseases classified elsewhere: Secondary | ICD-10-CM | POA: Diagnosis not present

## 2023-03-04 DIAGNOSIS — N3281 Overactive bladder: Secondary | ICD-10-CM | POA: Diagnosis not present

## 2023-03-04 DIAGNOSIS — B9620 Unspecified Escherichia coli [E. coli] as the cause of diseases classified elsewhere: Secondary | ICD-10-CM | POA: Diagnosis not present

## 2023-03-04 DIAGNOSIS — I4891 Unspecified atrial fibrillation: Secondary | ICD-10-CM | POA: Diagnosis not present

## 2023-03-04 DIAGNOSIS — Z7722 Contact with and (suspected) exposure to environmental tobacco smoke (acute) (chronic): Secondary | ICD-10-CM | POA: Diagnosis not present

## 2023-03-04 DIAGNOSIS — R32 Unspecified urinary incontinence: Secondary | ICD-10-CM | POA: Diagnosis not present

## 2023-03-04 DIAGNOSIS — I5032 Chronic diastolic (congestive) heart failure: Secondary | ICD-10-CM | POA: Diagnosis not present

## 2023-03-04 DIAGNOSIS — Q809 Congenital ichthyosis, unspecified: Secondary | ICD-10-CM | POA: Diagnosis not present

## 2023-03-04 DIAGNOSIS — Z9181 History of falling: Secondary | ICD-10-CM | POA: Diagnosis not present

## 2023-03-04 DIAGNOSIS — N390 Urinary tract infection, site not specified: Secondary | ICD-10-CM | POA: Diagnosis not present

## 2023-03-04 DIAGNOSIS — Z791 Long term (current) use of non-steroidal anti-inflammatories (NSAID): Secondary | ICD-10-CM | POA: Diagnosis not present

## 2023-03-04 DIAGNOSIS — H409 Unspecified glaucoma: Secondary | ICD-10-CM | POA: Diagnosis not present

## 2023-03-04 DIAGNOSIS — M21371 Foot drop, right foot: Secondary | ICD-10-CM | POA: Diagnosis not present

## 2023-03-04 DIAGNOSIS — M25512 Pain in left shoulder: Secondary | ICD-10-CM | POA: Diagnosis not present

## 2023-03-04 DIAGNOSIS — H5461 Unqualified visual loss, right eye, normal vision left eye: Secondary | ICD-10-CM | POA: Diagnosis not present

## 2023-03-08 DIAGNOSIS — M25511 Pain in right shoulder: Secondary | ICD-10-CM | POA: Diagnosis not present

## 2023-03-08 DIAGNOSIS — Q809 Congenital ichthyosis, unspecified: Secondary | ICD-10-CM | POA: Diagnosis not present

## 2023-03-08 DIAGNOSIS — I4891 Unspecified atrial fibrillation: Secondary | ICD-10-CM | POA: Diagnosis not present

## 2023-03-08 DIAGNOSIS — M25562 Pain in left knee: Secondary | ICD-10-CM | POA: Diagnosis not present

## 2023-03-08 DIAGNOSIS — M25512 Pain in left shoulder: Secondary | ICD-10-CM | POA: Diagnosis not present

## 2023-03-08 DIAGNOSIS — Z87440 Personal history of urinary (tract) infections: Secondary | ICD-10-CM | POA: Diagnosis not present

## 2023-03-08 DIAGNOSIS — I4892 Unspecified atrial flutter: Secondary | ICD-10-CM | POA: Diagnosis not present

## 2023-03-08 DIAGNOSIS — K219 Gastro-esophageal reflux disease without esophagitis: Secondary | ICD-10-CM | POA: Diagnosis not present

## 2023-03-08 DIAGNOSIS — Z9181 History of falling: Secondary | ICD-10-CM | POA: Diagnosis not present

## 2023-03-08 DIAGNOSIS — K589 Irritable bowel syndrome without diarrhea: Secondary | ICD-10-CM | POA: Diagnosis not present

## 2023-03-08 DIAGNOSIS — F419 Anxiety disorder, unspecified: Secondary | ICD-10-CM | POA: Diagnosis not present

## 2023-03-08 DIAGNOSIS — E785 Hyperlipidemia, unspecified: Secondary | ICD-10-CM | POA: Diagnosis not present

## 2023-03-08 DIAGNOSIS — H8120 Vestibular neuronitis, unspecified ear: Secondary | ICD-10-CM | POA: Diagnosis not present

## 2023-03-08 DIAGNOSIS — Z79891 Long term (current) use of opiate analgesic: Secondary | ICD-10-CM | POA: Diagnosis not present

## 2023-03-08 DIAGNOSIS — H409 Unspecified glaucoma: Secondary | ICD-10-CM | POA: Diagnosis not present

## 2023-03-08 DIAGNOSIS — K5909 Other constipation: Secondary | ICD-10-CM | POA: Diagnosis not present

## 2023-03-08 DIAGNOSIS — Z7982 Long term (current) use of aspirin: Secondary | ICD-10-CM | POA: Diagnosis not present

## 2023-03-08 DIAGNOSIS — E1036 Type 1 diabetes mellitus with diabetic cataract: Secondary | ICD-10-CM | POA: Diagnosis not present

## 2023-03-08 DIAGNOSIS — G8929 Other chronic pain: Secondary | ICD-10-CM | POA: Diagnosis not present

## 2023-03-08 DIAGNOSIS — H5461 Unqualified visual loss, right eye, normal vision left eye: Secondary | ICD-10-CM | POA: Diagnosis not present

## 2023-03-08 DIAGNOSIS — N3281 Overactive bladder: Secondary | ICD-10-CM | POA: Diagnosis not present

## 2023-03-08 DIAGNOSIS — I110 Hypertensive heart disease with heart failure: Secondary | ICD-10-CM | POA: Diagnosis not present

## 2023-03-08 DIAGNOSIS — R32 Unspecified urinary incontinence: Secondary | ICD-10-CM | POA: Diagnosis not present

## 2023-03-08 DIAGNOSIS — I5033 Acute on chronic diastolic (congestive) heart failure: Secondary | ICD-10-CM | POA: Diagnosis not present

## 2023-03-08 DIAGNOSIS — M21371 Foot drop, right foot: Secondary | ICD-10-CM | POA: Diagnosis not present

## 2023-03-09 DIAGNOSIS — I4892 Unspecified atrial flutter: Secondary | ICD-10-CM | POA: Diagnosis not present

## 2023-03-09 DIAGNOSIS — I5033 Acute on chronic diastolic (congestive) heart failure: Secondary | ICD-10-CM | POA: Diagnosis not present

## 2023-03-09 DIAGNOSIS — K5909 Other constipation: Secondary | ICD-10-CM | POA: Diagnosis not present

## 2023-03-09 DIAGNOSIS — E785 Hyperlipidemia, unspecified: Secondary | ICD-10-CM | POA: Diagnosis not present

## 2023-03-09 DIAGNOSIS — N3281 Overactive bladder: Secondary | ICD-10-CM | POA: Diagnosis not present

## 2023-03-09 DIAGNOSIS — M21371 Foot drop, right foot: Secondary | ICD-10-CM | POA: Diagnosis not present

## 2023-03-09 DIAGNOSIS — I110 Hypertensive heart disease with heart failure: Secondary | ICD-10-CM | POA: Diagnosis not present

## 2023-03-09 DIAGNOSIS — G8929 Other chronic pain: Secondary | ICD-10-CM | POA: Diagnosis not present

## 2023-03-09 DIAGNOSIS — I4891 Unspecified atrial fibrillation: Secondary | ICD-10-CM | POA: Diagnosis not present

## 2023-03-09 DIAGNOSIS — M25562 Pain in left knee: Secondary | ICD-10-CM | POA: Diagnosis not present

## 2023-03-09 DIAGNOSIS — M25511 Pain in right shoulder: Secondary | ICD-10-CM | POA: Diagnosis not present

## 2023-03-09 DIAGNOSIS — M25512 Pain in left shoulder: Secondary | ICD-10-CM | POA: Diagnosis not present

## 2023-03-09 DIAGNOSIS — K219 Gastro-esophageal reflux disease without esophagitis: Secondary | ICD-10-CM | POA: Diagnosis not present

## 2023-03-09 DIAGNOSIS — K589 Irritable bowel syndrome without diarrhea: Secondary | ICD-10-CM | POA: Diagnosis not present

## 2023-03-09 DIAGNOSIS — E1036 Type 1 diabetes mellitus with diabetic cataract: Secondary | ICD-10-CM | POA: Diagnosis not present

## 2023-03-09 DIAGNOSIS — F419 Anxiety disorder, unspecified: Secondary | ICD-10-CM | POA: Diagnosis not present

## 2023-03-12 DIAGNOSIS — I4891 Unspecified atrial fibrillation: Secondary | ICD-10-CM | POA: Diagnosis not present

## 2023-03-12 DIAGNOSIS — R851 Abnormal level of hormones in specimens from digestive organs and abdominal cavity: Secondary | ICD-10-CM | POA: Diagnosis not present

## 2023-03-12 DIAGNOSIS — I5032 Chronic diastolic (congestive) heart failure: Secondary | ICD-10-CM | POA: Diagnosis not present

## 2023-03-12 DIAGNOSIS — M1712 Unilateral primary osteoarthritis, left knee: Secondary | ICD-10-CM | POA: Diagnosis not present

## 2023-03-12 DIAGNOSIS — M25511 Pain in right shoulder: Secondary | ICD-10-CM | POA: Diagnosis not present

## 2023-03-12 DIAGNOSIS — M25562 Pain in left knee: Secondary | ICD-10-CM | POA: Diagnosis not present

## 2023-03-12 DIAGNOSIS — R2681 Unsteadiness on feet: Secondary | ICD-10-CM | POA: Diagnosis not present

## 2023-03-14 ENCOUNTER — Inpatient Hospital Stay (HOSPITAL_COMMUNITY): Payer: Medicare Other

## 2023-03-14 ENCOUNTER — Inpatient Hospital Stay (HOSPITAL_COMMUNITY)
Admission: EM | Admit: 2023-03-14 | Discharge: 2023-03-22 | DRG: 481 | Disposition: A | Discharge status: Skilled Nursing Facility | Payer: Medicare Other | Attending: Obstetrics and Gynecology | Admitting: Obstetrics and Gynecology

## 2023-03-14 ENCOUNTER — Emergency Department (HOSPITAL_COMMUNITY): Payer: Medicare Other

## 2023-03-14 ENCOUNTER — Other Ambulatory Visit: Payer: Self-pay

## 2023-03-14 ENCOUNTER — Encounter (HOSPITAL_COMMUNITY): Payer: Self-pay

## 2023-03-14 DIAGNOSIS — S72451A Displaced supracondylar fracture without intracondylar extension of lower end of right femur, initial encounter for closed fracture: Secondary | ICD-10-CM | POA: Diagnosis not present

## 2023-03-14 DIAGNOSIS — E669 Obesity, unspecified: Secondary | ICD-10-CM | POA: Diagnosis not present

## 2023-03-14 DIAGNOSIS — R278 Other lack of coordination: Secondary | ICD-10-CM | POA: Diagnosis not present

## 2023-03-14 DIAGNOSIS — Z471 Aftercare following joint replacement surgery: Secondary | ICD-10-CM | POA: Diagnosis not present

## 2023-03-14 DIAGNOSIS — S82852D Displaced trimalleolar fracture of left lower leg, subsequent encounter for closed fracture with routine healing: Secondary | ICD-10-CM | POA: Diagnosis not present

## 2023-03-14 DIAGNOSIS — S79911A Unspecified injury of right hip, initial encounter: Secondary | ICD-10-CM | POA: Diagnosis not present

## 2023-03-14 DIAGNOSIS — W010XXA Fall on same level from slipping, tripping and stumbling without subsequent striking against object, initial encounter: Secondary | ICD-10-CM | POA: Diagnosis present

## 2023-03-14 DIAGNOSIS — I5032 Chronic diastolic (congestive) heart failure: Secondary | ICD-10-CM | POA: Diagnosis not present

## 2023-03-14 DIAGNOSIS — H409 Unspecified glaucoma: Secondary | ICD-10-CM | POA: Diagnosis present

## 2023-03-14 DIAGNOSIS — W19XXXA Unspecified fall, initial encounter: Secondary | ICD-10-CM | POA: Diagnosis not present

## 2023-03-14 DIAGNOSIS — H4052X3 Glaucoma secondary to other eye disorders, left eye, severe stage: Secondary | ICD-10-CM | POA: Diagnosis not present

## 2023-03-14 DIAGNOSIS — E785 Hyperlipidemia, unspecified: Secondary | ICD-10-CM | POA: Diagnosis present

## 2023-03-14 DIAGNOSIS — S82852A Displaced trimalleolar fracture of left lower leg, initial encounter for closed fracture: Secondary | ICD-10-CM | POA: Insufficient documentation

## 2023-03-14 DIAGNOSIS — S72401A Unspecified fracture of lower end of right femur, initial encounter for closed fracture: Secondary | ICD-10-CM | POA: Diagnosis not present

## 2023-03-14 DIAGNOSIS — K219 Gastro-esophageal reflux disease without esophagitis: Secondary | ICD-10-CM | POA: Diagnosis present

## 2023-03-14 DIAGNOSIS — M6281 Muscle weakness (generalized): Secondary | ICD-10-CM | POA: Diagnosis not present

## 2023-03-14 DIAGNOSIS — I482 Chronic atrial fibrillation, unspecified: Secondary | ICD-10-CM | POA: Diagnosis present

## 2023-03-14 DIAGNOSIS — I480 Paroxysmal atrial fibrillation: Secondary | ICD-10-CM | POA: Diagnosis not present

## 2023-03-14 DIAGNOSIS — N179 Acute kidney failure, unspecified: Secondary | ICD-10-CM | POA: Diagnosis not present

## 2023-03-14 DIAGNOSIS — M25572 Pain in left ankle and joints of left foot: Secondary | ICD-10-CM | POA: Diagnosis not present

## 2023-03-14 DIAGNOSIS — Z79899 Other long term (current) drug therapy: Secondary | ICD-10-CM | POA: Diagnosis not present

## 2023-03-14 DIAGNOSIS — M8589 Other specified disorders of bone density and structure, multiple sites: Secondary | ICD-10-CM | POA: Diagnosis not present

## 2023-03-14 DIAGNOSIS — E109 Type 1 diabetes mellitus without complications: Secondary | ICD-10-CM | POA: Diagnosis present

## 2023-03-14 DIAGNOSIS — M9712XA Periprosthetic fracture around internal prosthetic left knee joint, initial encounter: Secondary | ICD-10-CM | POA: Diagnosis not present

## 2023-03-14 DIAGNOSIS — Z6829 Body mass index (BMI) 29.0-29.9, adult: Secondary | ICD-10-CM

## 2023-03-14 DIAGNOSIS — F411 Generalized anxiety disorder: Secondary | ICD-10-CM | POA: Diagnosis not present

## 2023-03-14 DIAGNOSIS — S9302XA Subluxation of left ankle joint, initial encounter: Secondary | ICD-10-CM

## 2023-03-14 DIAGNOSIS — Z7982 Long term (current) use of aspirin: Secondary | ICD-10-CM

## 2023-03-14 DIAGNOSIS — Z885 Allergy status to narcotic agent status: Secondary | ICD-10-CM | POA: Diagnosis not present

## 2023-03-14 DIAGNOSIS — I10 Essential (primary) hypertension: Secondary | ICD-10-CM | POA: Diagnosis present

## 2023-03-14 DIAGNOSIS — H5461 Unqualified visual loss, right eye, normal vision left eye: Secondary | ICD-10-CM | POA: Diagnosis not present

## 2023-03-14 DIAGNOSIS — R609 Edema, unspecified: Secondary | ICD-10-CM | POA: Diagnosis not present

## 2023-03-14 DIAGNOSIS — M978XXA Periprosthetic fracture around other internal prosthetic joint, initial encounter: Principal | ICD-10-CM

## 2023-03-14 DIAGNOSIS — S72401S Unspecified fracture of lower end of right femur, sequela: Secondary | ICD-10-CM | POA: Diagnosis not present

## 2023-03-14 DIAGNOSIS — Z8673 Personal history of transient ischemic attack (TIA), and cerebral infarction without residual deficits: Secondary | ICD-10-CM

## 2023-03-14 DIAGNOSIS — S72491A Other fracture of lower end of right femur, initial encounter for closed fracture: Secondary | ICD-10-CM

## 2023-03-14 DIAGNOSIS — G8911 Acute pain due to trauma: Secondary | ICD-10-CM | POA: Diagnosis not present

## 2023-03-14 DIAGNOSIS — S72491D Other fracture of lower end of right femur, subsequent encounter for closed fracture with routine healing: Secondary | ICD-10-CM | POA: Diagnosis not present

## 2023-03-14 DIAGNOSIS — Z91048 Other nonmedicinal substance allergy status: Secondary | ICD-10-CM | POA: Diagnosis not present

## 2023-03-14 DIAGNOSIS — S82892A Other fracture of left lower leg, initial encounter for closed fracture: Secondary | ICD-10-CM | POA: Diagnosis not present

## 2023-03-14 DIAGNOSIS — S82832A Other fracture of upper and lower end of left fibula, initial encounter for closed fracture: Secondary | ICD-10-CM | POA: Diagnosis not present

## 2023-03-14 DIAGNOSIS — Z794 Long term (current) use of insulin: Secondary | ICD-10-CM

## 2023-03-14 DIAGNOSIS — N898 Other specified noninflammatory disorders of vagina: Secondary | ICD-10-CM | POA: Diagnosis not present

## 2023-03-14 DIAGNOSIS — M9711XA Periprosthetic fracture around internal prosthetic right knee joint, initial encounter: Secondary | ICD-10-CM | POA: Diagnosis not present

## 2023-03-14 DIAGNOSIS — S82409A Unspecified fracture of shaft of unspecified fibula, initial encounter for closed fracture: Secondary | ICD-10-CM

## 2023-03-14 DIAGNOSIS — I509 Heart failure, unspecified: Secondary | ICD-10-CM | POA: Diagnosis not present

## 2023-03-14 DIAGNOSIS — Z7983 Long term (current) use of bisphosphonates: Secondary | ICD-10-CM | POA: Diagnosis not present

## 2023-03-14 DIAGNOSIS — D72829 Elevated white blood cell count, unspecified: Secondary | ICD-10-CM | POA: Diagnosis not present

## 2023-03-14 DIAGNOSIS — R5381 Other malaise: Secondary | ICD-10-CM | POA: Diagnosis not present

## 2023-03-14 DIAGNOSIS — Q782 Osteopetrosis: Secondary | ICD-10-CM | POA: Diagnosis not present

## 2023-03-14 DIAGNOSIS — Z96651 Presence of right artificial knee joint: Secondary | ICD-10-CM | POA: Diagnosis not present

## 2023-03-14 DIAGNOSIS — M19042 Primary osteoarthritis, left hand: Secondary | ICD-10-CM | POA: Diagnosis not present

## 2023-03-14 DIAGNOSIS — D62 Acute posthemorrhagic anemia: Secondary | ICD-10-CM | POA: Diagnosis not present

## 2023-03-14 DIAGNOSIS — Y92009 Unspecified place in unspecified non-institutional (private) residence as the place of occurrence of the external cause: Secondary | ICD-10-CM | POA: Diagnosis not present

## 2023-03-14 DIAGNOSIS — I110 Hypertensive heart disease with heart failure: Secondary | ICD-10-CM | POA: Diagnosis not present

## 2023-03-14 DIAGNOSIS — E10649 Type 1 diabetes mellitus with hypoglycemia without coma: Secondary | ICD-10-CM | POA: Diagnosis not present

## 2023-03-14 DIAGNOSIS — S8262XA Displaced fracture of lateral malleolus of left fibula, initial encounter for closed fracture: Secondary | ICD-10-CM | POA: Diagnosis not present

## 2023-03-14 DIAGNOSIS — R9431 Abnormal electrocardiogram [ECG] [EKG]: Secondary | ICD-10-CM | POA: Diagnosis not present

## 2023-03-14 DIAGNOSIS — S72401D Unspecified fracture of lower end of right femur, subsequent encounter for closed fracture with routine healing: Secondary | ICD-10-CM | POA: Diagnosis not present

## 2023-03-14 DIAGNOSIS — Z4789 Encounter for other orthopedic aftercare: Secondary | ICD-10-CM | POA: Diagnosis not present

## 2023-03-14 DIAGNOSIS — S93432A Sprain of tibiofibular ligament of left ankle, initial encounter: Secondary | ICD-10-CM | POA: Diagnosis not present

## 2023-03-14 DIAGNOSIS — I4891 Unspecified atrial fibrillation: Secondary | ICD-10-CM | POA: Diagnosis not present

## 2023-03-14 DIAGNOSIS — Z8249 Family history of ischemic heart disease and other diseases of the circulatory system: Secondary | ICD-10-CM | POA: Diagnosis not present

## 2023-03-14 DIAGNOSIS — Z7401 Bed confinement status: Secondary | ICD-10-CM | POA: Diagnosis not present

## 2023-03-14 DIAGNOSIS — I83892 Varicose veins of left lower extremities with other complications: Secondary | ICD-10-CM | POA: Diagnosis not present

## 2023-03-14 DIAGNOSIS — M25569 Pain in unspecified knee: Secondary | ICD-10-CM | POA: Diagnosis not present

## 2023-03-14 DIAGNOSIS — M1712 Unilateral primary osteoarthritis, left knee: Secondary | ICD-10-CM | POA: Diagnosis not present

## 2023-03-14 DIAGNOSIS — E66812 Obesity, class 2: Secondary | ICD-10-CM | POA: Diagnosis present

## 2023-03-14 DIAGNOSIS — E1065 Type 1 diabetes mellitus with hyperglycemia: Secondary | ICD-10-CM | POA: Diagnosis not present

## 2023-03-14 DIAGNOSIS — R600 Localized edema: Secondary | ICD-10-CM | POA: Diagnosis not present

## 2023-03-14 DIAGNOSIS — K5900 Constipation, unspecified: Secondary | ICD-10-CM | POA: Diagnosis not present

## 2023-03-14 LAB — COMPREHENSIVE METABOLIC PANEL
ALT: 26 U/L (ref 0–44)
AST: 27 U/L (ref 15–41)
Albumin: 3.7 g/dL (ref 3.5–5.0)
Alkaline Phosphatase: 100 U/L (ref 38–126)
Anion gap: 13 (ref 5–15)
BUN: 23 mg/dL (ref 8–23)
CO2: 27 mmol/L (ref 22–32)
Calcium: 10 mg/dL (ref 8.9–10.3)
Chloride: 96 mmol/L — ABNORMAL LOW (ref 98–111)
Creatinine, Ser: 0.78 mg/dL (ref 0.44–1.00)
GFR, Estimated: >60 mL/min (ref >60)
Glucose, Bld: 184 mg/dL — ABNORMAL HIGH (ref 70–99)
Potassium: 5.2 mmol/L — ABNORMAL HIGH (ref 3.5–5.1)
Sodium: 136 mmol/L (ref 135–145)
Total Bilirubin: 1.2 mg/dL (ref 0.3–1.2)
Total Protein: 6.8 g/dL (ref 6.5–8.1)

## 2023-03-14 LAB — CBC WITH DIFFERENTIAL/PLATELET
Abs Immature Granulocytes: 0.05 10*3/uL (ref 0.00–0.07)
Basophils Absolute: 0.1 10*3/uL (ref 0.0–0.1)
Basophils Relative: 0 %
Eosinophils Absolute: 0.1 10*3/uL (ref 0.0–0.5)
Eosinophils Relative: 0 %
HCT: 50.2 % — ABNORMAL HIGH (ref 36.0–46.0)
Hemoglobin: 15.9 g/dL — ABNORMAL HIGH (ref 12.0–15.0)
Immature Granulocytes: 0 %
Lymphocytes Relative: 8 %
Lymphs Abs: 1.2 10*3/uL (ref 0.7–4.0)
MCH: 29.4 pg (ref 26.0–34.0)
MCHC: 31.7 g/dL (ref 30.0–36.0)
MCV: 92.8 fL (ref 80.0–100.0)
Monocytes Absolute: 1 10*3/uL (ref 0.1–1.0)
Monocytes Relative: 6 %
Neutro Abs: 13.1 10*3/uL — ABNORMAL HIGH (ref 1.7–7.7)
Neutrophils Relative %: 86 %
Platelets: 142 10*3/uL — ABNORMAL LOW (ref 150–400)
RBC: 5.41 MIL/uL — ABNORMAL HIGH (ref 3.87–5.11)
RDW: 16.2 % — ABNORMAL HIGH (ref 11.5–15.5)
WBC: 15.4 10*3/uL — ABNORMAL HIGH (ref 4.0–10.5)
nRBC: 0 % (ref 0.0–0.2)

## 2023-03-14 LAB — CBG MONITORING, ED
Glucose-Capillary: 67 mg/dL — ABNORMAL LOW (ref 70–99)
Glucose-Capillary: 125 mg/dL — ABNORMAL HIGH (ref 70–99)

## 2023-03-14 MED ORDER — HYDROMORPHONE HCL 1 MG/ML IJ SOLN
0.5000 mg | Freq: Once | INTRAMUSCULAR | Status: AC
  Administered 2023-03-14: 0.5 mg via INTRAVENOUS
  Filled 2023-03-14: qty 1

## 2023-03-14 MED ORDER — ONDANSETRON HCL 4 MG/2ML IJ SOLN
4.0000 mg | Freq: Once | INTRAMUSCULAR | Status: AC
  Administered 2023-03-14: 4 mg via INTRAVENOUS
  Filled 2023-03-14: qty 2

## 2023-03-14 MED ORDER — DILTIAZEM HCL 90 MG PO TABS
90.0000 mg | ORAL_TABLET | Freq: Once | ORAL | Status: AC
  Administered 2023-03-14: 90 mg via ORAL
  Filled 2023-03-14: qty 1

## 2023-03-14 MED ORDER — SODIUM CHLORIDE 0.9 % IV BOLUS
1000.0000 mL | Freq: Once | INTRAVENOUS | Status: AC
  Administered 2023-03-14: 1000 mL via INTRAVENOUS

## 2023-03-14 MED ORDER — ACETAMINOPHEN 500 MG PO TABS
1000.0000 mg | ORAL_TABLET | Freq: Once | ORAL | Status: AC
  Administered 2023-03-14: 1000 mg via ORAL
  Filled 2023-03-14: qty 2

## 2023-03-14 NOTE — Assessment & Plan Note (Signed)
NPO after MN. Ortho to see in the AM. Oxycodone for pain prn. IV morphine for severe pain

## 2023-03-14 NOTE — Assessment & Plan Note (Signed)
NPO after MN. Ortho to see in the AM. Oxycodone for pain prn. IV morphine for severe pain 

## 2023-03-14 NOTE — Assessment & Plan Note (Signed)
Continue with insulin. Pt was hypoglycemic on arrival. Gentle  IVF with dextrose.

## 2023-03-14 NOTE — ED Triage Notes (Signed)
Pt arrived to ED via PTAR from home w/ c/o of R knee injury secondary to being assisted to the ground after becoming weak while ambulating d/t hypoglycemia per PTAR. Pt was using her walker at the time and called out to her husband who assisted her to the ground. PTAR reports pt was given OJ prior to their arrival as well as pt took 8mg  zofran. Pt reported to PTAR that her R knee twisted during the event and that she had it replaced about 30 years ago.   PTAR VS: CBG 88, BP 142/78, 96% RA HR 105

## 2023-03-14 NOTE — ED Notes (Addendum)
Pt transferred from Hallway 15 to ED 21 per provider request due to A fib with RVR on EKG.

## 2023-03-14 NOTE — ED Notes (Signed)
CBG 67, gave pt OJ w/ pack of sugar mixed in and graham cracker w/ peanut butter

## 2023-03-14 NOTE — Assessment & Plan Note (Signed)
Admit to telemetry bed. NPO after MN. Ortho to see in the AM. Oxycodone for pain prn. IV morphine for severe pain
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
## 2023-03-14 NOTE — ED Notes (Signed)
This RN spoke with ortho tech about ordered splint.  Per ortho tech, can be here shortly.
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
## 2023-03-14 NOTE — Assessment & Plan Note (Signed)
Continue cardizem. Not on systemic anticoagulation due to prior hx of subarachnoid hemorrhage.

## 2023-03-14 NOTE — Assessment & Plan Note (Signed)
Chronic. 

## 2023-03-14 NOTE — Assessment & Plan Note (Signed)
Stable euvolemic 

## 2023-03-14 NOTE — Assessment & Plan Note (Signed)
Continue pravastatin 40 mg qday.

## 2023-03-14 NOTE — Assessment & Plan Note (Signed)
Stable
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
## 2023-03-15 ENCOUNTER — Encounter (HOSPITAL_COMMUNITY): Payer: Self-pay | Admitting: Internal Medicine

## 2023-03-15 ENCOUNTER — Inpatient Hospital Stay (HOSPITAL_COMMUNITY): Payer: Medicare Other

## 2023-03-15 ENCOUNTER — Encounter (HOSPITAL_COMMUNITY)
Admission: EM | Disposition: A | Discharge status: Skilled Nursing Facility | Payer: Self-pay | Source: Home / Self Care | Attending: Internal Medicine

## 2023-03-15 ENCOUNTER — Other Ambulatory Visit: Payer: Self-pay

## 2023-03-15 ENCOUNTER — Inpatient Hospital Stay (HOSPITAL_COMMUNITY): Payer: Medicare Other | Admitting: Certified Registered"

## 2023-03-15 DIAGNOSIS — I509 Heart failure, unspecified: Secondary | ICD-10-CM

## 2023-03-15 DIAGNOSIS — I110 Hypertensive heart disease with heart failure: Secondary | ICD-10-CM | POA: Diagnosis not present

## 2023-03-15 DIAGNOSIS — M978XXA Periprosthetic fracture around other internal prosthetic joint, initial encounter: Secondary | ICD-10-CM | POA: Diagnosis not present

## 2023-03-15 DIAGNOSIS — S72491D Other fracture of lower end of right femur, subsequent encounter for closed fracture with routine healing: Secondary | ICD-10-CM

## 2023-03-15 DIAGNOSIS — E109 Type 1 diabetes mellitus without complications: Secondary | ICD-10-CM

## 2023-03-15 DIAGNOSIS — S82852A Displaced trimalleolar fracture of left lower leg, initial encounter for closed fracture: Secondary | ICD-10-CM | POA: Diagnosis not present

## 2023-03-15 HISTORY — PX: ORIF FEMUR FRACTURE: SHX2119

## 2023-03-15 HISTORY — PX: ORIF ANKLE FRACTURE: SHX5408

## 2023-03-15 LAB — CBC
HCT: 43.2 % (ref 36.0–46.0)
Hemoglobin: 14.1 g/dL (ref 12.0–15.0)
MCH: 29.7 pg (ref 26.0–34.0)
MCHC: 32.6 g/dL (ref 30.0–36.0)
MCV: 91.1 fL (ref 80.0–100.0)
Platelets: 147 10*3/uL — ABNORMAL LOW (ref 150–400)
RBC: 4.74 MIL/uL (ref 3.87–5.11)
RDW: 16.5 % — ABNORMAL HIGH (ref 11.5–15.5)
WBC: 16.7 10*3/uL — ABNORMAL HIGH (ref 4.0–10.5)
nRBC: 0 % (ref 0.0–0.2)

## 2023-03-15 LAB — GLUCOSE, CAPILLARY
Glucose-Capillary: 248 mg/dL — ABNORMAL HIGH (ref 70–99)
Glucose-Capillary: 193 mg/dL — ABNORMAL HIGH (ref 70–99)
Glucose-Capillary: 185 mg/dL — ABNORMAL HIGH (ref 70–99)
Glucose-Capillary: 238 mg/dL — ABNORMAL HIGH (ref 70–99)
Glucose-Capillary: 276 mg/dL — ABNORMAL HIGH (ref 70–99)

## 2023-03-15 LAB — CREATININE, SERUM
Creatinine, Ser: 0.96 mg/dL (ref 0.44–1.00)
GFR, Estimated: >60 mL/min (ref >60)

## 2023-03-15 LAB — CBG MONITORING, ED
Glucose-Capillary: 308 mg/dL — ABNORMAL HIGH (ref 70–99)
Glucose-Capillary: 302 mg/dL — ABNORMAL HIGH (ref 70–99)

## 2023-03-15 LAB — SURGICAL PCR SCREEN
MRSA, PCR: NEGATIVE
Staphylococcus aureus: NEGATIVE

## 2023-03-15 SURGERY — OPEN REDUCTION INTERNAL FIXATION (ORIF) DISTAL FEMUR FRACTURE
Anesthesia: General | Site: Leg Upper | Laterality: Right

## 2023-03-15 MED ORDER — MORPHINE SULFATE (PF) 2 MG/ML IV SOLN
2.0000 mg | INTRAVENOUS | Status: DC | PRN
  Administered 2023-03-15 – 2023-03-19 (×2): 2 mg via INTRAVENOUS
  Filled 2023-03-15 (×2): qty 1

## 2023-03-15 MED ORDER — POLYETHYLENE GLYCOL 3350 17 G PO PACK: 17.0000 g | PACK | Freq: Every day | ORAL | Status: DC | PRN

## 2023-03-15 MED ORDER — VANCOMYCIN HCL 1000 MG IV SOLR
INTRAVENOUS | Status: AC
  Filled 2023-03-15: qty 20

## 2023-03-15 MED ORDER — ONDANSETRON HCL 4 MG PO TABS: 4.0000 mg | ORAL_TABLET | Freq: Four times a day (QID) | ORAL | Status: DC | PRN

## 2023-03-15 MED ORDER — FENTANYL CITRATE (PF) 250 MCG/5ML IJ SOLN
INTRAMUSCULAR | Status: AC
  Filled 2023-03-15: qty 5

## 2023-03-15 MED ORDER — ONDANSETRON HCL 4 MG/2ML IJ SOLN
4.0000 mg | Freq: Four times a day (QID) | INTRAMUSCULAR | Status: DC | PRN
  Administered 2023-03-15: 4 mg via INTRAVENOUS
  Filled 2023-03-15: qty 2

## 2023-03-15 MED ORDER — PHENYLEPHRINE HCL-NACL 20-0.9 MG/250ML-% IV SOLN
INTRAVENOUS | Status: DC | PRN
  Administered 2023-03-15: 25 ug/min via INTRAVENOUS

## 2023-03-15 MED ORDER — PHENYLEPHRINE 80 MCG/ML (10ML) SYRINGE FOR IV PUSH (FOR BLOOD PRESSURE SUPPORT)
PREFILLED_SYRINGE | INTRAVENOUS | Status: DC | PRN
  Administered 2023-03-15: 160 ug via INTRAVENOUS
  Administered 2023-03-15: 240 ug via INTRAVENOUS
  Administered 2023-03-15 (×2): 160 ug via INTRAVENOUS

## 2023-03-15 MED ORDER — DEXAMETHASONE SODIUM PHOSPHATE 10 MG/ML IJ SOLN
INTRAMUSCULAR | Status: AC
  Filled 2023-03-15: qty 1

## 2023-03-15 MED ORDER — BACITRACIN 500 UNIT/GM EX OINT: TOPICAL_OINTMENT | CUTANEOUS | Status: DC | PRN

## 2023-03-15 MED ORDER — CHLORHEXIDINE GLUCONATE 4 % EX SOLN: 60.0000 mL | Freq: Once | CUTANEOUS | Status: DC

## 2023-03-15 MED ORDER — GABAPENTIN 100 MG PO CAPS
100.0000 mg | ORAL_CAPSULE | Freq: Every day | ORAL | Status: DC
  Administered 2023-03-15 – 2023-03-21 (×8): 100 mg via ORAL
  Filled 2023-03-15 (×8): qty 1

## 2023-03-15 MED ORDER — LACTATED RINGERS IV SOLN: INTRAVENOUS | Status: DC

## 2023-03-15 MED ORDER — DEXAMETHASONE SODIUM PHOSPHATE 10 MG/ML IJ SOLN
INTRAMUSCULAR | Status: DC | PRN
  Administered 2023-03-15: 5 mg via INTRAVENOUS

## 2023-03-15 MED ORDER — DILTIAZEM HCL ER COATED BEADS 180 MG PO CP24
180.0000 mg | ORAL_CAPSULE | Freq: Every day | ORAL | Status: DC
  Administered 2023-03-15 – 2023-03-22 (×8): 180 mg via ORAL
  Filled 2023-03-15 (×8): qty 1

## 2023-03-15 MED ORDER — ROCURONIUM BROMIDE 10 MG/ML (PF) SYRINGE
PREFILLED_SYRINGE | INTRAVENOUS | Status: DC | PRN
  Administered 2023-03-15: 70 mg via INTRAVENOUS
  Administered 2023-03-15: 20 mg via INTRAVENOUS

## 2023-03-15 MED ORDER — METOCLOPRAMIDE HCL 5 MG/ML IJ SOLN: 5.0000 mg | Freq: Three times a day (TID) | INTRAMUSCULAR | Status: DC | PRN

## 2023-03-15 MED ORDER — PHENYLEPHRINE 80 MCG/ML (10ML) SYRINGE FOR IV PUSH (FOR BLOOD PRESSURE SUPPORT)
PREFILLED_SYRINGE | INTRAVENOUS | Status: AC
  Filled 2023-03-15: qty 20

## 2023-03-15 MED ORDER — LIDOCAINE 2% (20 MG/ML) 5 ML SYRINGE
INTRAMUSCULAR | Status: AC
  Filled 2023-03-15: qty 10

## 2023-03-15 MED ORDER — METOPROLOL SUCCINATE ER 50 MG PO TB24
100.0000 mg | ORAL_TABLET | Freq: Every day | ORAL | Status: DC
  Administered 2023-03-15 – 2023-03-22 (×8): 100 mg via ORAL
  Filled 2023-03-15 (×9): qty 2

## 2023-03-15 MED ORDER — METOPROLOL TARTRATE 5 MG/5ML IV SOLN
2.5000 mg | Freq: Once | INTRAVENOUS | Status: AC
  Administered 2023-03-15: 2.5 mg via INTRAVENOUS
  Filled 2023-03-15: qty 5

## 2023-03-15 MED ORDER — ORAL CARE MOUTH RINSE: 15.0000 mL | Freq: Once | OROMUCOSAL | Status: AC

## 2023-03-15 MED ORDER — MORPHINE SULFATE (PF) 2 MG/ML IV SOLN
2.0000 mg | INTRAVENOUS | Status: DC | PRN
  Administered 2023-03-15 (×2): 2 mg via INTRAVENOUS
  Filled 2023-03-15 (×2): qty 1

## 2023-03-15 MED ORDER — OXYCODONE HCL 5 MG PO TABS
5.0000 mg | ORAL_TABLET | ORAL | Status: DC | PRN
  Administered 2023-03-15: 5 mg via ORAL
  Filled 2023-03-15: qty 1

## 2023-03-15 MED ORDER — ENOXAPARIN SODIUM 40 MG/0.4ML IJ SOSY
40.0000 mg | PREFILLED_SYRINGE | INTRAMUSCULAR | Status: DC
  Administered 2023-03-16 – 2023-03-22 (×7): 40 mg via SUBCUTANEOUS
  Filled 2023-03-15 (×7): qty 0.4

## 2023-03-15 MED ORDER — PROPOFOL 10 MG/ML IV BOLUS
INTRAVENOUS | Status: AC
  Filled 2023-03-15: qty 20

## 2023-03-15 MED ORDER — ONDANSETRON HCL 4 MG/2ML IJ SOLN
INTRAMUSCULAR | Status: AC
  Filled 2023-03-15: qty 2

## 2023-03-15 MED ORDER — TRANEXAMIC ACID-NACL 1000-0.7 MG/100ML-% IV SOLN: 1000.0000 mg | INTRAVENOUS | Status: DC

## 2023-03-15 MED ORDER — INSULIN ASPART 100 UNIT/ML IJ SOLN
0.0000 [IU] | Freq: Three times a day (TID) | INTRAMUSCULAR | Status: DC
  Administered 2023-03-15 (×2): 3 [IU] via SUBCUTANEOUS
  Administered 2023-03-16 (×2): 9 [IU] via SUBCUTANEOUS
  Administered 2023-03-16: 5 [IU] via SUBCUTANEOUS
  Administered 2023-03-17: 0 [IU] via SUBCUTANEOUS
  Administered 2023-03-17: 3 [IU] via SUBCUTANEOUS
  Administered 2023-03-17: 1 [IU] via SUBCUTANEOUS
  Administered 2023-03-18: 2 [IU] via SUBCUTANEOUS
  Administered 2023-03-18: 1 [IU] via SUBCUTANEOUS
  Administered 2023-03-18: 3 [IU] via SUBCUTANEOUS
  Administered 2023-03-19: 2 [IU] via SUBCUTANEOUS
  Administered 2023-03-19: 3 [IU] via SUBCUTANEOUS
  Administered 2023-03-19: 2 [IU] via SUBCUTANEOUS
  Administered 2023-03-20: 5 [IU] via SUBCUTANEOUS
  Administered 2023-03-20: 1 [IU] via SUBCUTANEOUS
  Administered 2023-03-20: 3 [IU] via SUBCUTANEOUS
  Administered 2023-03-21 (×2): 1 [IU] via SUBCUTANEOUS
  Administered 2023-03-21 – 2023-03-22 (×2): 3 [IU] via SUBCUTANEOUS
  Administered 2023-03-22: 1 [IU] via SUBCUTANEOUS

## 2023-03-15 MED ORDER — ACETAMINOPHEN 650 MG RE SUPP: 650.0000 mg | Freq: Four times a day (QID) | RECTAL | Status: DC | PRN

## 2023-03-15 MED ORDER — LIDOCAINE 2% (20 MG/ML) 5 ML SYRINGE
INTRAMUSCULAR | Status: DC | PRN
  Administered 2023-03-15: 60 mg via INTRAVENOUS

## 2023-03-15 MED ORDER — CHLORHEXIDINE GLUCONATE 0.12 % MT SOLN
OROMUCOSAL | Status: AC
  Administered 2023-03-15: 15 mL via OROMUCOSAL
  Filled 2023-03-15: qty 15

## 2023-03-15 MED ORDER — CHLORHEXIDINE GLUCONATE 0.12 % MT SOLN: 15.0000 mL | Freq: Once | OROMUCOSAL | Status: AC

## 2023-03-15 MED ORDER — 0.9 % SODIUM CHLORIDE (POUR BTL) OPTIME
TOPICAL | Status: DC | PRN
  Administered 2023-03-15: 1000 mL

## 2023-03-15 MED ORDER — VANCOMYCIN HCL 1000 MG IV SOLR
INTRAVENOUS | Status: AC
  Filled 2023-03-15: qty 40

## 2023-03-15 MED ORDER — ONDANSETRON HCL 4 MG/2ML IJ SOLN
4.0000 mg | Freq: Four times a day (QID) | INTRAMUSCULAR | Status: DC | PRN
  Administered 2023-03-21: 4 mg via INTRAVENOUS
  Filled 2023-03-15: qty 2

## 2023-03-15 MED ORDER — CEFAZOLIN SODIUM-DEXTROSE 2-4 GM/100ML-% IV SOLN
INTRAVENOUS | Status: AC
  Filled 2023-03-15: qty 100

## 2023-03-15 MED ORDER — METHOCARBAMOL 1000 MG/10ML IJ SOLN: 500.0000 mg | Freq: Four times a day (QID) | INTRAVENOUS | Status: DC | PRN

## 2023-03-15 MED ORDER — INSULIN ASPART 100 UNIT/ML IJ SOLN
0.0000 [IU] | Freq: Every day | INTRAMUSCULAR | Status: DC
  Administered 2023-03-15: 3 [IU] via SUBCUTANEOUS
  Administered 2023-03-15: 4 [IU] via SUBCUTANEOUS
  Administered 2023-03-16: 2 [IU] via SUBCUTANEOUS

## 2023-03-15 MED ORDER — INSULIN GLARGINE-YFGN 100 UNIT/ML ~~LOC~~ SOLN
8.0000 [IU] | Freq: Every day | SUBCUTANEOUS | Status: DC
  Administered 2023-03-15 (×2): 8 [IU] via SUBCUTANEOUS
  Filled 2023-03-15 (×4): qty 0.08

## 2023-03-15 MED ORDER — SUGAMMADEX SODIUM 200 MG/2ML IV SOLN
INTRAVENOUS | Status: DC | PRN
  Administered 2023-03-15: 200 mg via INTRAVENOUS

## 2023-03-15 MED ORDER — FUROSEMIDE 40 MG PO TABS
40.0000 mg | ORAL_TABLET | Freq: Every day | ORAL | Status: DC
  Administered 2023-03-15 – 2023-03-17 (×3): 40 mg via ORAL
  Filled 2023-03-15 (×3): qty 1

## 2023-03-15 MED ORDER — LIDOCAINE 2% (20 MG/ML) 5 ML SYRINGE
INTRAMUSCULAR | Status: AC
  Filled 2023-03-15: qty 5

## 2023-03-15 MED ORDER — DOUBLE ANTIBIOTIC 500-10000 UNIT/GM EX OINT
TOPICAL_OINTMENT | CUTANEOUS | Status: AC
  Filled 2023-03-15: qty 28.4

## 2023-03-15 MED ORDER — PRAVASTATIN SODIUM 40 MG PO TABS
40.0000 mg | ORAL_TABLET | Freq: Every day | ORAL | Status: DC
  Administered 2023-03-15 – 2023-03-21 (×8): 40 mg via ORAL
  Filled 2023-03-15 (×8): qty 1

## 2023-03-15 MED ORDER — ONDANSETRON HCL 4 MG/2ML IJ SOLN
INTRAMUSCULAR | Status: DC | PRN
  Administered 2023-03-15: 4 mg via INTRAVENOUS

## 2023-03-15 MED ORDER — HEPARIN SODIUM (PORCINE) 5000 UNIT/ML IJ SOLN: 5000.0000 [IU] | Freq: Three times a day (TID) | INTRAMUSCULAR | Status: DC

## 2023-03-15 MED ORDER — DEXTROSE IN LACTATED RINGERS 5 % IV SOLN: INTRAVENOUS | Status: DC

## 2023-03-15 MED ORDER — CEFAZOLIN SODIUM-DEXTROSE 2-4 GM/100ML-% IV SOLN: 2.0000 g | INTRAVENOUS | Status: DC

## 2023-03-15 MED ORDER — ACETAMINOPHEN 500 MG PO TABS
1000.0000 mg | ORAL_TABLET | Freq: Four times a day (QID) | ORAL | Status: DC
  Administered 2023-03-15 – 2023-03-16 (×3): 1000 mg via ORAL
  Filled 2023-03-15 (×3): qty 2

## 2023-03-15 MED ORDER — OXYCODONE-ACETAMINOPHEN 5-325 MG PO TABS
1.0000 | ORAL_TABLET | ORAL | Status: DC | PRN
  Administered 2023-03-16: 1 via ORAL
  Filled 2023-03-15: qty 1

## 2023-03-15 MED ORDER — POVIDONE-IODINE 10 % EX SWAB
2.0000 | Freq: Once | CUTANEOUS | Status: AC
  Administered 2023-03-15: 2 via TOPICAL

## 2023-03-15 MED ORDER — PROPOFOL 10 MG/ML IV BOLUS
INTRAVENOUS | Status: DC | PRN
  Administered 2023-03-15: 100 mg via INTRAVENOUS

## 2023-03-15 MED ORDER — DOCUSATE SODIUM 100 MG PO CAPS
100.0000 mg | ORAL_CAPSULE | Freq: Two times a day (BID) | ORAL | Status: DC
  Administered 2023-03-15 – 2023-03-22 (×14): 100 mg via ORAL
  Filled 2023-03-15 (×14): qty 1

## 2023-03-15 MED ORDER — EPHEDRINE 5 MG/ML INJ
INTRAVENOUS | Status: AC
  Filled 2023-03-15: qty 5

## 2023-03-15 MED ORDER — ACETAMINOPHEN 325 MG PO TABS
650.0000 mg | ORAL_TABLET | Freq: Four times a day (QID) | ORAL | Status: DC | PRN
  Administered 2023-03-15 (×2): 650 mg via ORAL
  Filled 2023-03-15 (×2): qty 2

## 2023-03-15 MED ORDER — FENTANYL CITRATE (PF) 250 MCG/5ML IJ SOLN
INTRAMUSCULAR | Status: DC | PRN
  Administered 2023-03-15: 75 ug via INTRAVENOUS
  Administered 2023-03-15: 25 ug via INTRAVENOUS
  Administered 2023-03-15: 75 ug via INTRAVENOUS
  Administered 2023-03-15: 25 ug via INTRAVENOUS

## 2023-03-15 MED ORDER — ASPIRIN 325 MG PO TABS: 325.0000 mg | ORAL_TABLET | Freq: Every day | ORAL | Status: DC

## 2023-03-15 MED ORDER — DULOXETINE HCL 60 MG PO CPEP
60.0000 mg | ORAL_CAPSULE | Freq: Every day | ORAL | Status: DC
  Administered 2023-03-15 – 2023-03-22 (×8): 60 mg via ORAL
  Filled 2023-03-15 (×8): qty 1

## 2023-03-15 MED ORDER — METHOCARBAMOL 500 MG PO TABS
500.0000 mg | ORAL_TABLET | Freq: Four times a day (QID) | ORAL | Status: DC | PRN
  Administered 2023-03-15 – 2023-03-22 (×7): 500 mg via ORAL
  Filled 2023-03-15 (×7): qty 1

## 2023-03-15 MED ORDER — LOSARTAN POTASSIUM 50 MG PO TABS
25.0000 mg | ORAL_TABLET | Freq: Every day | ORAL | Status: DC
  Administered 2023-03-15 – 2023-03-17 (×3): 25 mg via ORAL
  Filled 2023-03-15 (×3): qty 1

## 2023-03-15 MED ORDER — VANCOMYCIN HCL 1000 MG IV SOLR
INTRAVENOUS | Status: DC | PRN
  Administered 2023-03-15: 2 g via TOPICAL

## 2023-03-15 MED ORDER — METOCLOPRAMIDE HCL 5 MG PO TABS: 5.0000 mg | ORAL_TABLET | Freq: Three times a day (TID) | ORAL | Status: DC | PRN

## 2023-03-15 MED ORDER — MIRABEGRON ER 50 MG PO TB24
50.0000 mg | ORAL_TABLET | Freq: Every day | ORAL | Status: DC
  Administered 2023-03-15 – 2023-03-22 (×8): 50 mg via ORAL
  Filled 2023-03-15 (×8): qty 1

## 2023-03-15 MED ORDER — TRANEXAMIC ACID-NACL 1000-0.7 MG/100ML-% IV SOLN
INTRAVENOUS | Status: AC
  Filled 2023-03-15: qty 100

## 2023-03-15 MED ORDER — ROCURONIUM BROMIDE 10 MG/ML (PF) SYRINGE
PREFILLED_SYRINGE | INTRAVENOUS | Status: AC
  Filled 2023-03-15: qty 20

## 2023-03-15 MED ORDER — BUPIVACAINE HCL (PF) 0.5 % IJ SOLN
INTRAMUSCULAR | Status: AC
  Filled 2023-03-15: qty 30

## 2023-03-15 MED ORDER — SPIRONOLACTONE 12.5 MG HALF TABLET
12.5000 mg | ORAL_TABLET | Freq: Every day | ORAL | Status: DC
  Administered 2023-03-15 – 2023-03-17 (×3): 12.5 mg via ORAL
  Filled 2023-03-15 (×3): qty 1

## 2023-03-15 MED ORDER — CEFAZOLIN SODIUM-DEXTROSE 2-4 GM/100ML-% IV SOLN
2.0000 g | Freq: Three times a day (TID) | INTRAVENOUS | Status: AC
  Administered 2023-03-15 – 2023-03-16 (×3): 2 g via INTRAVENOUS
  Filled 2023-03-15 (×3): qty 100

## 2023-03-15 SURGICAL SUPPLY — 116 items
ADH SKN CLS APL DERMABOND .7 (GAUZE/BANDAGES/DRESSINGS) ×2
APL PRP STRL LF DISP 70% ISPRP (MISCELLANEOUS) ×6
BAG COUNTER SPONGE SURGICOUNT (BAG) ×3 IMPLANT
BAG SPNG CNTER NS LX DISP (BAG) ×2
BANDAGE ESMARK 6X9 LF (GAUZE/BANDAGES/DRESSINGS) ×3 IMPLANT
BIT DRILL 4.3 (BIT) ×2
BIT DRILL 4.3X300MM (BIT) IMPLANT
BIT DRILL LONG 3.3 (BIT) IMPLANT
BIT DRILL QC 2.0 SHORT EVOS SM (DRILL) IMPLANT
BIT DRILL QC 2.5MM SHRT EVO SM (DRILL) IMPLANT
BIT DRILL QC 3.3X195 (BIT) IMPLANT
BLADE CLIPPER SURG (BLADE) IMPLANT
BNDG CMPR 5X4 KNIT ELC UNQ LF (GAUZE/BANDAGES/DRESSINGS) ×2
BNDG CMPR 5X6 CHSV STRCH STRL (GAUZE/BANDAGES/DRESSINGS)
BNDG CMPR 9X6 STRL LF SNTH (GAUZE/BANDAGES/DRESSINGS)
BNDG CMPR MED 10X6 ELC LF (GAUZE/BANDAGES/DRESSINGS) ×2
BNDG CMPR STD VLCR NS LF 5.8X4 (GAUZE/BANDAGES/DRESSINGS) ×4
BNDG COHESIVE 4X5 TAN STRL (GAUZE/BANDAGES/DRESSINGS) ×3 IMPLANT
BNDG COHESIVE 6X5 TAN ST LF (GAUZE/BANDAGES/DRESSINGS) ×3 IMPLANT
BNDG ELASTIC 4INX 5YD STR LF (GAUZE/BANDAGES/DRESSINGS) IMPLANT
BNDG ELASTIC 4X5.8 VLCR NS LF (GAUZE/BANDAGES/DRESSINGS) IMPLANT
BNDG ELASTIC 4X5.8 VLCR STR LF (GAUZE/BANDAGES/DRESSINGS) IMPLANT
BNDG ELASTIC 6X10 VLCR STRL LF (GAUZE/BANDAGES/DRESSINGS) ×3 IMPLANT
BNDG ELASTIC 6X5.8 VLCR STR LF (GAUZE/BANDAGES/DRESSINGS) IMPLANT
BNDG ESMARK 6X9 LF (GAUZE/BANDAGES/DRESSINGS)
BRUSH SCRUB EZ PLAIN DRY (MISCELLANEOUS) ×6 IMPLANT
CANISTER SUCT 3000ML PPV (MISCELLANEOUS) ×3 IMPLANT
CAP LOCK NCB (Cap) IMPLANT
CHLORAPREP W/TINT 26 (MISCELLANEOUS) ×3 IMPLANT
CNTNR URN SCR LID CUP LEK RST (MISCELLANEOUS) IMPLANT
CONT SPEC 4OZ STRL OR WHT (MISCELLANEOUS) ×2
COVER SURGICAL LIGHT HANDLE (MISCELLANEOUS) ×3 IMPLANT
DERMABOND ADVANCED .7 DNX12 (GAUZE/BANDAGES/DRESSINGS) IMPLANT
DRAPE C-ARM 42X72 X-RAY (DRAPES) ×3 IMPLANT
DRAPE C-ARMOR (DRAPES) ×3 IMPLANT
DRAPE HALF SHEET 40X57 (DRAPES) ×6 IMPLANT
DRAPE ORTHO SPLIT 77X108 STRL (DRAPES) ×6
DRAPE SURG 17X23 STRL (DRAPES) ×3 IMPLANT
DRAPE SURG ORHT 6 SPLT 77X108 (DRAPES) ×6 IMPLANT
DRAPE U-SHAPE 47X51 STRL (DRAPES) ×3 IMPLANT
DRESSING MEPILEX FLEX 4X4 (GAUZE/BANDAGES/DRESSINGS) IMPLANT
DRILL QC 2.0 SHORT EVOS SM (DRILL) ×2
DRILL QC 2.5MM SHORT EVOS SM (DRILL) ×2
DRSG ADAPTIC 3X8 NADH LF (GAUZE/BANDAGES/DRESSINGS) IMPLANT
DRSG MEPILEX FLEX 4X4 (GAUZE/BANDAGES/DRESSINGS)
DRSG MEPILEX POST OP 4X12 (GAUZE/BANDAGES/DRESSINGS) IMPLANT
DRSG MEPILEX POST OP 4X8 (GAUZE/BANDAGES/DRESSINGS) IMPLANT
DRSG MEPITEL 4X7.2 (GAUZE/BANDAGES/DRESSINGS) IMPLANT
ELECT REM PT RETURN 9FT ADLT (ELECTROSURGICAL) ×2
ELECTRODE REM PT RTRN 9FT ADLT (ELECTROSURGICAL) ×3 IMPLANT
GAUZE PAD ABD 8X10 STRL (GAUZE/BANDAGES/DRESSINGS) ×9 IMPLANT
GAUZE SPONGE 4X4 12PLY STRL (GAUZE/BANDAGES/DRESSINGS) ×3 IMPLANT
GAUZE SPONGE 4X4 12PLY STRL LF (GAUZE/BANDAGES/DRESSINGS) IMPLANT
GLOVE BIO SURGEON STRL SZ 6.5 (GLOVE) ×9 IMPLANT
GLOVE BIO SURGEON STRL SZ7.5 (GLOVE) ×12 IMPLANT
GLOVE BIOGEL PI IND STRL 6.5 (GLOVE) ×3 IMPLANT
GLOVE BIOGEL PI IND STRL 7.5 (GLOVE) ×3 IMPLANT
GOWN STRL REUS W/ TWL LRG LVL3 (GOWN DISPOSABLE) ×9 IMPLANT
GOWN STRL REUS W/TWL LRG LVL3 (GOWN DISPOSABLE) ×6
K-WIRE 2.0 (WIRE) ×2
K-WIRE FXSTD 280X2XNS SS (WIRE) ×2
KIT BASIN OR (CUSTOM PROCEDURE TRAY) ×3 IMPLANT
KIT TURNOVER KIT B (KITS) ×3 IMPLANT
KWIRE FXSTD 280X2XNS SS (WIRE) IMPLANT
MANIFOLD NEPTUNE II (INSTRUMENTS) ×3 IMPLANT
NDL HYPO 21X1.5 SAFETY (NEEDLE) IMPLANT
NDL HYPO 25GX1X1/2 BEV (NEEDLE) ×3 IMPLANT
NEEDLE HYPO 21X1.5 SAFETY (NEEDLE) IMPLANT
NEEDLE HYPO 25GX1X1/2 BEV (NEEDLE) ×2 IMPLANT
NS IRRIG 1000ML POUR BTL (IV SOLUTION) ×3 IMPLANT
PACK TOTAL JOINT (CUSTOM PROCEDURE TRAY) ×3 IMPLANT
PAD ABD 8X10 STRL (GAUZE/BANDAGES/DRESSINGS) IMPLANT
PAD ARMBOARD 7.5X6 YLW CONV (MISCELLANEOUS) ×6 IMPLANT
PAD CAST 4YDX4 CTTN HI CHSV (CAST SUPPLIES) ×3 IMPLANT
PADDING CAST COTTON 4X4 STRL (CAST SUPPLIES)
PADDING CAST COTTON 6X4 STRL (CAST SUPPLIES) ×3 IMPLANT
PADDING CAST SYNTHETIC 4X4 STR (CAST SUPPLIES) IMPLANT
PADDING CAST SYNTHETIC 6X4 NS (CAST SUPPLIES) IMPLANT
PLATE FEM DIST NCB PP 278MM (Plate) IMPLANT
PLATE FIB EVOS 2.7X125 9H LT (Plate) IMPLANT
SCREW 5.0 80MM (Screw) IMPLANT
SCREW CORT 2.7X17 ST T8 EVOS (Screw) IMPLANT
SCREW CORT 2.7X17 T8 ST EVOS (Screw) IMPLANT
SCREW CORT 3.5X17 ST EVOS (Screw) IMPLANT
SCREW CORT 3.5X44MM ST EVOS (Screw) IMPLANT
SCREW CORT ST EVOS 3.5X46 (Screw) IMPLANT
SCREW CORT ST EVOS 3.5X65 (Screw) IMPLANT
SCREW CORT ST EVOS 3.5X70 (Screw) IMPLANT
SCREW EVOS 2.7X18 LOCK T8 (Screw) IMPLANT
SCREW LOCK 3.5X14MM EVOS (Screw) IMPLANT
SCREW LOCK ST EVOS 3.5X13 (Screw) IMPLANT
SCREW NCB 3.5X75X5X6.2XST (Screw) IMPLANT
SCREW NCB 4.0X36MM (Screw) IMPLANT
SCREW NCB 5.0X36MM (Screw) IMPLANT
SCREW NCB 5.0X38 (Screw) IMPLANT
SCREW NCB 5.0X75MM (Screw) ×4 IMPLANT
SLEEVE SURGEON STRL (DRAPES) IMPLANT
SPONGE T-LAP 18X18 ~~LOC~~+RFID (SPONGE) IMPLANT
STAPLER VISISTAT 35W (STAPLE) ×3 IMPLANT
SUCTION FRAZIER HANDLE 10FR (MISCELLANEOUS) ×2
SUCTION TUBE FRAZIER 10FR DISP (MISCELLANEOUS) ×3 IMPLANT
SUT ETHILON 3 0 PS 1 (SUTURE) ×6 IMPLANT
SUT MNCRL AB 3-0 PS2 18 (SUTURE) IMPLANT
SUT PROLENE 0 CT (SUTURE) IMPLANT
SUT VIC AB 0 CT1 27 (SUTURE) ×2
SUT VIC AB 0 CT1 27XBRD ANBCTR (SUTURE) ×3 IMPLANT
SUT VIC AB 1 CT1 27 (SUTURE)
SUT VIC AB 1 CT1 27XBRD ANBCTR (SUTURE) IMPLANT
SUT VIC AB 2-0 CT1 27 (SUTURE) ×4
SUT VIC AB 2-0 CT1 TAPERPNT 27 (SUTURE) ×6 IMPLANT
SYR CONTROL 10ML LL (SYRINGE) ×3 IMPLANT
TOWEL GREEN STERILE (TOWEL DISPOSABLE) ×6 IMPLANT
TOWEL GREEN STERILE FF (TOWEL DISPOSABLE) ×3 IMPLANT
TRAY FOLEY MTR SLVR 16FR STAT (SET/KITS/TRAYS/PACK) IMPLANT
UNDERPAD 30X36 HEAVY ABSORB (UNDERPADS AND DIAPERS) ×3 IMPLANT
WATER STERILE IRR 1000ML POUR (IV SOLUTION) ×6 IMPLANT

## 2023-03-15 NOTE — Anesthesia Postprocedure Evaluation (Signed)
Anesthesia Post Note  Patient: [name]  Procedure(s) Performed: OPEN REDUCTION INTERNAL FIXATION (ORIF) DISTAL FEMUR FRACTURE (Right: Leg Upper) OPEN REDUCTION INTERNAL FIXATION (ORIF) ANKLE FRACTURE (Left: Ankle)     Patient location during evaluation: PACU Anesthesia Type: General Level of consciousness: awake and alert Pain management: pain level controlled Vital Signs Assessment: post-procedure vital signs reviewed and stable Respiratory status: spontaneous breathing, nonlabored ventilation, respiratory function stable and patient connected to nasal cannula oxygen Cardiovascular status: blood pressure returned to baseline, stable and tachycardic Postop Assessment: no apparent nausea or vomiting Anesthetic complications: no  No notable events documented.  Last Vitals:  Vitals:   03/15/23 1615 03/15/23 1650  BP: (!) 124/59 114/67  Pulse: (!) 128 (!) 123  Resp: 20 14  Temp:  36.6 C  SpO2: 97% 100%    Last Pain:  Vitals:   03/15/23 1650  TempSrc: Oral  PainSc: 5                  Benuel Ly,W. EDMOND

## 2023-03-15 NOTE — Progress Notes (Signed)
Orthopedic Tech Progress Note Patient Details:  [name] Jun 12, 1951 [id number]  Ortho Devices Type of Ortho Device: Post (short leg) splint, Stirrup splint Ortho Device/Splint Location: lle Ortho Device/Splint Interventions: Ordered, Application, Adjustment  We assisted dr Roda Shutters with splint post reduction. Plaster. Post Interventions Patient Tolerated: Well Instructions Provided: Care of device, Adjustment of device  [name] Post 03/15/2023, 7:23 AM
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
## 2023-03-15 NOTE — Progress Notes (Signed)
Orthopedic Tech Progress Note Patient Details:  [name] 1951-07-30 [id number]  Ortho Devices Type of Ortho Device: Clements (short leg) splint, Stirrup splint Ortho Device/Splint Location: lle Ortho Device/Splint Interventions: Ordered, Application, Adjustment   Clements Interventions Patient Tolerated: Well Instructions Provided: Care of device, Adjustment of device  [name] 03/15/2023, 12:17 AM

## 2023-03-15 NOTE — Anesthesia Preprocedure Evaluation (Addendum)
Anesthesia Evaluation  Patient identified by MRN, date of birth, ID band Patient awake    Reviewed: Allergy & Precautions, H&P , NPO status , Patient's Chart, lab work & pertinent test results  Airway Mallampati: II  TM Distance: >3 FB Neck ROM: Full    Dental no notable dental hx. (+) Teeth Intact, Dental Advisory Given   Pulmonary neg pulmonary ROS   Pulmonary exam normal breath sounds clear to auscultation       Cardiovascular hypertension, Pt. on medications and Pt. on home beta blockers +CHF  + dysrhythmias Atrial Fibrillation  Rhythm:Regular Rate:Normal     Neuro/Psych   Anxiety     negative neurological ROS     GI/Hepatic Neg liver ROS,GERD  Medicated,,  Endo/Other  diabetes, Type 1, Insulin Dependent    Renal/GU negative Renal ROS  negative genitourinary   Musculoskeletal  (+) Arthritis , Osteoarthritis,    Abdominal   Peds  Hematology negative hematology ROS (+)   Anesthesia Other Findings   Reproductive/Obstetrics negative OB ROS                             Anesthesia Physical Anesthesia Plan  ASA: 3  Anesthesia Plan: General   Post-op Pain Management: Tylenol PO (pre-op)*   Induction: Intravenous  PONV Risk Score and Plan: 4 or greater and Ondansetron, Dexamethasone and Treatment may vary due to age or medical condition  Airway Management Planned: Oral ETT  Additional Equipment:   Intra-op Plan:   Post-operative Plan: Extubation in OR  Informed Consent: I have reviewed the patients History and Physical, chart, labs and discussed the procedure including the risks, benefits and alternatives for the proposed anesthesia with the patient or authorized representative who has indicated his/her understanding and acceptance.     Dental advisory given  Plan Discussed with: CRNA  Anesthesia Plan Comments:        Anesthesia Quick Evaluation

## 2023-03-15 NOTE — Progress Notes (Signed)
Orthopedic Tech Progress Note Patient Details:  [name] [birth year] [id number]  Patient ID: [name], female   DOB: [birth year], 72 y.o.   MRN: [id number] I attended trauma page. [name] Post 03/15/2023, 5:26 AM
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
## 2023-03-15 NOTE — TOC CAGE-AID Note (Signed)
Transition of Care [facility]) - CAGE-AID Screening  Patient Details  Name: [name] MRN: [id number] Date of Birth: 30-Oct-1951  Clinical Narrative:  Patient to ED after a fall at home due to hypoglycemia. Patient reports occasional alcohol use of a glass of wine once a month, no other substance use. Patient denies need for resources at this time.  CAGE-AID Screening:   Have You Ever Felt You Ought to Cut Down on Your Drinking or Drug Use?: No Have People Annoyed You By Critizing Your Drinking Or Drug Use?: No Have You Felt Bad Or Guilty About Your Drinking Or Drug Use?: No Have You Ever Had a Drink or Used Drugs First Thing In The Morning to Steady Your Nerves or to Get Rid of a Hangover?: No CAGE-AID Score: 0  Substance Abuse Education Offered: No

## 2023-03-15 NOTE — Anesthesia Procedure Notes (Signed)
Procedure Name: Intubation Date/Time: 03/15/2023 12:55 PM  Performed by: Elliot Dally, CRNAPre-anesthesia Checklist: Patient identified, Emergency Drugs available, Suction available and Patient being monitored Patient Re-evaluated:Patient Re-evaluated prior to induction Oxygen Delivery Method: Circle System Utilized Preoxygenation: Pre-oxygenation with 100% oxygen Induction Type: IV induction Ventilation: Mask ventilation without difficulty Laryngoscope Size: Miller and 2 Grade View: Grade II Tube type: Oral Tube size: 7.0 mm Number of attempts: 1 Airway Equipment and Method: Stylet and Oral airway Placement Confirmation: ETT inserted through vocal cords under direct vision, positive ETCO2 and breath sounds checked- equal and bilateral Secured at: 21 cm Tube secured with: Tape Dental Injury: Teeth and Oropharynx as per pre-operative assessment  Comments: Recommend 6.5 mm ETT for future intubation

## 2023-03-15 NOTE — Progress Notes (Signed)
Progress Note   Patient: [name] ZOX:[id number] DOB: Oct 15, 1951 DOA: 03/14/2023     1 DOS: the patient was seen and examined on 03/15/2023   Brief hospital course: 72 year old female history of chronic A-fib not on anticoagulation due to prior history of subarachnoid hemorrhage, chronic diastolic heart failure, hypertension, type 1 diabetes on chronic insulin, obesity, hyperlipidemia presents to the ER today after having a ground-level fall.  Patient was evaluated and determined to have right femur distal fracture periprosthetic and also a left trimalleolar fracture.  Patient evaluated by orthopedic surgery underwent open reduction internal fixation of both fractures today's postoperative day 0.  Assessment and Plan: * Closed fracture of distal end of right femur (HCC) Admit to telemetry bed. NPO after MN.  Orthopedic surgeon consulted, patient underwent open reduction internal fixation of the periprosthetic distal femur fracture, today is postoperative day 0. Continue pain management.  Leukocytosis: Patient has leukocytosis continue to monitor if trends up will consider starting antibiotic we will also check a procalcitonin.  Patient currently does not seem to be having any infectious etiology  Closed fibular fracture/trimalleolar fracture  NPO after MN. Ortho to see in the AM. Oxycodone for pain prn. IV morphine for severe pain -Status post open reduction internal fixation of the trimalleolar fracture postoperative day 0  Class 2 obesity Chronic.  Chronic diastolic congestive heart failure (HCC) Stable. euvolemic  A-fib (HCC) Continue cardizem. Not on systemic anticoagulation due to prior hx of subarachnoid hemorrhage.  Type 1 diabetes mellitus on insulin therapy (HCC) Continue with insulin. Pt was hypoglycemic on arrival. Gentle  IVF with dextrose.  Blood sugars seem to be fairly controlled.  Essential hypertension Stable.  Dyslipidemia Continue pravastatin 40 mg  qday.        Subjective: Patient was seen and examined at bedside today.  Patient was getting ready to go for surgery.  Currently did not complain of any significant pain.  Physical Exam: Vitals:   03/15/23 0811 03/15/23 1201 03/15/23 1510 03/15/23 1515  BP: 121/73 (!) 106/59 (!) 116/57 (!) 132/48  Pulse: (!) 104 (!) 104  (!) 127  Resp:   15 19  Temp: 98.6 F (37 C) (!) 97.3 F (36.3 C)    TempSrc: Oral Oral    SpO2: 98% 99% 100% 99%  Weight:      Height:       Constitutional:      General: She is not in acute distress.    Appearance: She is obese. She is not toxic-appearing or diaphoretic.  HENT:     Head: Normocephalic and atraumatic.     Nose: Nose normal.  Eyes:     General: No scleral icterus. Cardiovascular:     Rate and Rhythm: Normal rate. Rhythm irregular.  Pulmonary:     Effort: Pulmonary effort is normal.     Breath sounds: Normal breath sounds.  Abdominal:     General: Abdomen is protuberant. Bowel sounds are normal. There is no distension.     Palpations: Abdomen is soft.  Musculoskeletal:     Comments: Left lower leg externally rotated and shortened.  Skin:    General: Skin is warm and dry.     Capillary Refill: Capillary refill takes less than 2 seconds.     Comments: Chronic LE venous stasis changes  Neurological:     Mental Status: She is alert and oriented to person, place, and time.  Data Reviewed:  Blood sugars fairly well-controlled Patient has leukocytosis 16.7 Family Communication: Spoke to daughter at  bedside.  Disposition: Status is: Inpatient Remains inpatient appropriate because: Due to femoral fracture, trimalleolar fracture  Planned Discharge Destination: Skilled nursing facility    Time spent: 35 minutes  Author: Harold Hedge, MD 03/15/2023 3:30 PM  For on call review [email or web address].
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
## 2023-03-15 NOTE — Transfer of Care (Signed)
Immediate Anesthesia Transfer of Care Note  Patient: [name]  Procedure(s) Performed: OPEN REDUCTION INTERNAL FIXATION (ORIF) DISTAL FEMUR FRACTURE (Right: Leg Upper) OPEN REDUCTION INTERNAL FIXATION (ORIF) ANKLE FRACTURE (Left: Ankle)  Patient Location: PACU  Anesthesia Type:General  Level of Consciousness: awake, alert , and responds to stimulation  Airway & Oxygen Therapy: Patient Spontanous Breathing and Patient connected to face mask oxygen  Post-op Assessment: Report given to RN and Post -op Vital signs reviewed and stable  Post vital signs: Reviewed and stable  Last Vitals:  Vitals Value Taken Time  BP 116/57 03/15/23 1510  Temp    Pulse 123 03/15/23 1513  Resp 18 03/15/23 1513  SpO2 100 % 03/15/23 1513  Vitals shown include unvalidated device data.  Last Pain:  Vitals:   03/15/23 1201  TempSrc: Oral  PainSc: 0-No pain      Patients Stated Pain Goal: 3 (03/15/23 0924)  Complications: No notable events documented.

## 2023-03-15 NOTE — Op Note (Signed)
Orthopaedic Surgery Operative Note (CSN: [id number] ) Date of Surgery: 03/15/2023  Admit Date: 03/14/2023   Diagnoses: Pre-Op Diagnoses: Right periprosthetic distal femur fracture Left trimalleolar ankle fracture/dislocation   Post-Op Diagnosis: Same  Procedures: CPT 27511-Open reduction internal fixation of right periprosthetic distal femur fracture CPT 27822-Open reduction internal fixation of left trimalleolar ankle fracture CPT 27829-Repair of left ankle syndesmosis  Surgeons : Primary: Roby Lofts, MD  Assistant: Ulyses Southward, PA-C  Location: OR 3   Anesthesia: General   Antibiotics: Ancef 2g preop with 1 gm vancomycin powder placed in each distal femur incision and left ankle incision   Tourniquet time: None    Estimated Blood Loss: 100 mL  Complications: None  Specimens:None   Implants: Implant Name Type Inv. Item Serial No. Manufacturer Lot No. LRB No. Used Action  CAP LOCK NCB - [id number] Cap CAP LOCK NCB  [employer](ORTH,TRAU,BIO,SG)  Right 7 Implanted  PLATE FEM DIST NCB PP - [id number] Plate PLATE FEM DIST NCB PP  [employer](ORTH,TRAU,BIO,SG)  Right 1 Implanted  SCREW NCB 5.0X36MM - [id number] Screw SCREW NCB 5.0X36MM  [employer](ORTH,TRAU,BIO,SG)  Right 1 Implanted  SCREW NCB 5.0X38 - [id number] Screw SCREW NCB 5.0X38  [employer](ORTH,TRAU,BIO,SG)  Right 1 Implanted  SCREW NCB 5.0X75MM - [id number] Screw SCREW NCB 5.0X75MM  [employer](ORTH,TRAU,BIO,SG)  Right 2 Implanted  SCREW 5.0 - [id number] Screw SCREW 5.0  [employer](ORTH,TRAU,BIO,SG)  Right 3 Implanted  SCREW NCB 4.0X36MM - [id number] Screw SCREW NCB 4.0X36MM  [employer](ORTH,TRAU,BIO,SG)  Right 1 Implanted  PLATE FIB EVOS 9.5J884 9H LT - [id number] Plate PLATE FIB EVOS 1.0X323 9H LT  [employer]  Left 1 Implanted  SCREW CORT 3.5X17 ST EVOS - [id number] Screw SCREW CORT 3.5X17 ST EVOS  [employer]  Left 1 Implanted  SCREW EVOS  2.7X18 LOCK T8 - [id number] Screw SCREW EVOS 2.7X18 LOCK T8  [employer]  Left 2 Implanted  SCREW CORT ST EVOS 3.5X46 - [id number] Screw SCREW CORT ST EVOS 3.5X46  [employer]  Left 1 Implanted  SCREW LOCK 3.5X14MM EVOS - [id number] Screw SCREW LOCK 3.5X14MM EVOS  [employer]  Left 1 Implanted  SCREW LOCK ST EVOS 3.5X13 - [id number] Screw SCREW LOCK ST EVOS 3.5X13  [employer]  Left 1 Implanted  SCREW CORT 2.7X17 T8 ST EVOS - [id number] Screw SCREW CORT 2.7X17 T8 ST EVOS  [employer]  Left 2 Implanted  SCREW CORT 2.7X17 ST T8 EVOS - [id number] Screw SCREW CORT 2.7X17 ST T8 EVOS  [employer]  Left 1 Implanted  SCREW CORT 3.5X44MM ST EVOS - [id number] Screw SCREW CORT 3.5X44MM ST EVOS  [employer]  Left 1 Implanted  SCREW CORT ST EVOS 3.5X65 - [id number] Screw SCREW CORT ST EVOS 3.5X65  [employer]  Left 1 Implanted  SCREW CORT ST EVOS 3.5X70 - [id number] Screw SCREW CORT ST EVOS 3.5X70  [employer]  Left 1 Implanted     Indications for Surgery: 72 year old female who sustained a ground-level fall with a right periprosthetic distal femur fracture and a left trimalleolar ankle fracture dislocation.  Due to the unstable nature of her injuries I recommend proceeding with open reduction internal fixation of both.  Risks and benefits were discussed with the patient.  Risks include but not limited to bleeding, infection, malunion, nonunion, hardware failure, hardware irritation, nerve or blood vessel injury, DVT,  posttraumatic arthritis, even the possibility anesthetic complications.  She agreed to proceed with surgery and consent was obtained.  Operative Findings: 1.  Open reduction internal fixation of right periprosthetic distal femur fracture using [employer] NCB distal femoral locking plate. 2.  Open reduction internal fixation of left trimalleolar  ankle fracture dislocation using [employer] EVOS 3.5/2.7 mm distal fibular locking plate 3.  Open reduction internal fixation of left syndesmosis disruption using [employer]hew 3.5 mm bicortical screws across the fibula and tibia.  Procedure: The patient was identified in the preoperative holding area. Consent was confirmed with the patient and their family and all questions were answered. The operative extremity was marked after confirmation with the patient. she was then brought back to the operating room by our anesthesia colleagues.  She was placed under general anesthetic and carefully transferred over to radiolucent flattop table.  The bilateral lower extremities were then prepped and draped in usual sterile fashion.  A timeout was performed to verify the patient, the procedure, and the extremity.  Preoperative antibiotics were dosed.  I for started out with the right lower extremity.  Fluoroscopic imaging showed the unstable nature of her injury.  A lateral approach to the distal femur was carried down through skin and subcutaneous tissue.  I incised through the IT band and expose the lateral condyle of the distal femur.  I then developed the path underneath the vastus lateralis for placement of the plate.  I then reduced the fracture over a triangle and had my assistant provide traction and manipulation of the leg.  I then chose a 12 hole [employer] NCB distal femoral locking plate and slid this submuscularly along the lateral cortex of the femur attached to the targeting arm.  A K wire was placed distally to hold the position of the plate.  I then percutaneously placed a 3.3 mm drill bit in the proximal hole of the plate to align the proximal portion of the plate.  I then proceeded to drill and placed 5.0 millimeter screws distally to bring the plate flush to bone.  I then used the targeting arm to place 5.0 millimeter screws in the femoral shaft to correct the coronal alignment.  A total of  two 5.0 millimeter screws were placed.  I removed the 3.3 mm drill bit and placed a 4.0 millimeter screw in the most proximal screw hole.  I then placed a locking caps on the 5.0 millimeter screws in the femoral shaft and removed the targeting arm.  I then drilled and placed 3 more 5.0 millimeter screws in the distal segment and proceeded to place locking caps on all of these distal screws.  Final fluoroscopic imaging was obtained.  The incision was copiously irrigated.  A gram of vancomycin powder was placed into the incision.  A layered closure of 0 Vicryl, 2-0 Vicryl and 3-0 Monocryl with Dermabond was used to close the skin.  Sterile dressings were applied.  We then turned our attention to the left lower extremity.  Fluoroscopic imaging showed the unstable nature of her injury.  She had impaction of the posterior articular surface which caused the talus to continue to sublux posteriorly.  A lateral approach to the fibula was made and carried down through skin and subcutaneous tissue.  I exposed the fracture site and was able to reduce this easily.  I was able to get the fibula back out to length and chose to use a [employer] EVOS 3.5/2.7 mm distal  fibular locking plate and positioned this appropriately along the distal fibula.  I placed nonlocking screws to hold it in position and then placed 2.7 mm locking screws into the fibula.  I then reduced the plate to the proximal shaft of the fibula and placed a mixture of nonlocking and locking screws into the proximal shaft of the fibula.  There was some questionable subluxation of the talus still and as result I then percutaneously placed a incision along the anterior medial aspect of the tibia and then used a reduction tenaculum to anatomically reduce the syndesmosis as well as the talus.  I then placed bicortical screws across the fibula and tibia to hold the syndesmosis as well as the talus underneath the tibial plafond.  At this point the joint was  stable.  I then turned my attention to the medial side which the medial malleolus was well aligned and I percutaneously placed 3.5 millimeter screws bicortically to hold the medial malleolus fixation.  Final fluoroscopic imaging was obtained.  The incisions were copiously irrigated.  A gram of vancomycin powder was placed into the lateral incision.  Layered closure of 2-0 Vicryl and 3-0 nylon was used to close the skin.  Sterile dressing was applied.  And a well-padded short leg splint was then placed.  The patient was then awoken from anesthesia and taken to the PACU in stable condition.  Post Op Plan/Instructions: Patient will be weightbearing as tolerated to the right lower extremity.  She will be nonweightbearing to the left lower extremity.  She will receive postoperative Ancef.  She will receive Lovenox for DVT prophylaxis and discharged on a DOAC.  Will have him mobilize with physical and Occupational Therapy.  I was present and performed the entire surgery.  Ulyses Southward, PA-C did assist me throughout the case. An assistant was necessary given the difficulty in approach, maintenance of reduction and ability to instrument the fracture.   Truitt Merle, MD [facility]

## 2023-03-15 NOTE — ED Notes (Signed)
Pt overheard yelling in room.  This RN entered room to check on pt.  Pt states she thought she was at home and was falling down; states she might have been dreaming.  This RN reminded pt she was on the ED stretcher and was safe at this time.  Pt apologizing to this RN for yelling out and conversing with this RN at this time.

## 2023-03-15 NOTE — Interval H&P Note (Signed)
History and Physical Interval Note:  03/15/2023 12:23 PM  [name]  has presented today for surgery, with the diagnosis of Left ankle fracture, Right distal femur.  The various methods of treatment have been discussed with the patient and family. After consideration of risks, benefits and other options for treatment, the patient has consented to  Procedure(s): OPEN REDUCTION INTERNAL FIXATION (ORIF) DISTAL FEMUR FRACTURE (Right) OPEN REDUCTION INTERNAL FIXATION (ORIF) ANKLE FRACTURE (Left) as a surgical intervention.  The patient's history has been reviewed, patient examined, no change in status, stable for surgery.  I have reviewed the patient's chart and labs.  Questions were answered to the patient's satisfaction.     Caryn Bee P Melisia Leming

## 2023-03-15 NOTE — ED Notes (Signed)
Pt placed on 2L [place] at this time for desat noted while sleeping.

## 2023-03-15 NOTE — Progress Notes (Signed)
Orthopedic Tech Progress Note Patient Details:  [name] 27-Dec-1950 [id number]  Ortho Devices Type of Ortho Device: Knee Immobilizer Ortho Device/Splint Location: rle Ortho Device/Splint Interventions: Ordered, Application, Adjustment  The rn assisted with application. Post Interventions Patient Tolerated: Well Instructions Provided: Care of device, Adjustment of device  [name] Post 03/15/2023, 6:32 AM

## 2023-03-16 ENCOUNTER — Encounter (HOSPITAL_COMMUNITY): Payer: Self-pay | Admitting: Student

## 2023-03-16 DIAGNOSIS — S72491D Other fracture of lower end of right femur, subsequent encounter for closed fracture with routine healing: Secondary | ICD-10-CM | POA: Diagnosis not present

## 2023-03-16 LAB — BASIC METABOLIC PANEL
Anion gap: 7 (ref 5–15)
BUN: 37 mg/dL — ABNORMAL HIGH (ref 8–23)
CO2: 25 mmol/L (ref 22–32)
Calcium: 8.7 mg/dL — ABNORMAL LOW (ref 8.9–10.3)
Chloride: 99 mmol/L (ref 98–111)
Creatinine, Ser: 1.27 mg/dL — ABNORMAL HIGH (ref 0.44–1.00)
GFR, Estimated: 44 mL/min — ABNORMAL LOW (ref >60)
Glucose, Bld: 477 mg/dL — ABNORMAL HIGH (ref 70–99)
Potassium: 4.9 mmol/L (ref 3.5–5.1)
Sodium: 131 mmol/L — ABNORMAL LOW (ref 135–145)

## 2023-03-16 LAB — CBC
HCT: 37.3 % (ref 36.0–46.0)
Hemoglobin: 12 g/dL (ref 12.0–15.0)
MCH: 29.3 pg (ref 26.0–34.0)
MCHC: 32.2 g/dL (ref 30.0–36.0)
MCV: 91 fL (ref 80.0–100.0)
Platelets: 147 10*3/uL — ABNORMAL LOW (ref 150–400)
RBC: 4.1 MIL/uL (ref 3.87–5.11)
RDW: 15.9 % — ABNORMAL HIGH (ref 11.5–15.5)
WBC: 11.9 10*3/uL — ABNORMAL HIGH (ref 4.0–10.5)
nRBC: 0 % (ref 0.0–0.2)

## 2023-03-16 LAB — GLUCOSE, CAPILLARY
Glucose-Capillary: 429 mg/dL — ABNORMAL HIGH (ref 70–99)
Glucose-Capillary: 495 mg/dL — ABNORMAL HIGH (ref 70–99)
Glucose-Capillary: 466 mg/dL — ABNORMAL HIGH (ref 70–99)
Glucose-Capillary: 352 mg/dL — ABNORMAL HIGH (ref 70–99)
Glucose-Capillary: 210 mg/dL — ABNORMAL HIGH (ref 70–99)

## 2023-03-16 LAB — VITAMIN D 25 HYDROXY (VIT D DEFICIENCY, FRACTURES): Vit D, 25-Hydroxy: 45.38 ng/mL (ref 30–100)

## 2023-03-16 MED ORDER — ACETAMINOPHEN 325 MG PO TABS
650.0000 mg | ORAL_TABLET | Freq: Four times a day (QID) | ORAL | Status: DC | PRN
  Administered 2023-03-16 – 2023-03-22 (×11): 650 mg via ORAL
  Filled 2023-03-16 (×11): qty 2

## 2023-03-16 MED ORDER — INSULIN ASPART 100 UNIT/ML IJ SOLN
3.0000 [IU] | Freq: Three times a day (TID) | INTRAMUSCULAR | Status: AC
  Administered 2023-03-16 – 2023-03-21 (×15): 3 [IU] via SUBCUTANEOUS

## 2023-03-16 MED ORDER — LATANOPROST 0.005 % OP SOLN
1.0000 [drp] | Freq: Every day | OPHTHALMIC | Status: AC
  Administered 2023-03-16: 1 [drp] via OPHTHALMIC
  Filled 2023-03-16: qty 2.5

## 2023-03-16 MED ORDER — SODIUM CHLORIDE 0.9 % IV SOLN: INTRAVENOUS | Status: AC

## 2023-03-16 MED ORDER — INSULIN GLARGINE-YFGN 100 UNIT/ML ~~LOC~~ SOLN
6.0000 [IU] | Freq: Two times a day (BID) | SUBCUTANEOUS | Status: DC
  Administered 2023-03-16 – 2023-03-22 (×13): 6 [IU] via SUBCUTANEOUS
  Filled 2023-03-16 (×15): qty 0.06

## 2023-03-16 NOTE — Plan of Care (Signed)
  Problem: Education: Goal: Ability to describe self-care measures that may prevent or decrease complications (Diabetes Survival Skills Education) will improve Outcome: Progressing Goal: Individualized Educational Video(s) Outcome: Progressing   Problem: Coping: Goal: Ability to adjust to condition or change in health will improve Outcome: Progressing   

## 2023-03-16 NOTE — TOC Initial Note (Signed)
Transition of Care [facility]) - Initial/Assessment Note    Patient Details  Name: [name] MRN: [id number] Date of Birth: 1950-12-11  Transition of [facility]) CM/SW Contact:    Lawerance Sabal, RN Phone Number: 03/16/2023, 2:14 PM  Clinical Narrative:                  Per chart review patient recently active with [facility].  Currently has DME STEDY, shower chair, RW, power WC at home.  OT recs for CIR, PT pending.  From home w spouse and daughter.  TOC will continue to follow.    Expected Discharge Plan: IP Rehab Facility Barriers to Discharge: Continued Medical Work up   Patient Goals and CMS Choice            Expected Discharge Plan and Services   Discharge Planning Services: CM Consult   Living arrangements for the past 2 months: Single Family Home                                      Prior Living Arrangements/Services Living arrangements for the past 2 months: Single Family Home Lives with:: Spouse, Adult Children              Current home services: DME    Activities of Daily Living      Permission Sought/Granted                  Emotional Assessment              Admission diagnosis:  Subluxation of left ankle joint, initial encounter [S93.02XA] Trimalleolar fracture of ankle, closed, left, initial encounter [S82.852A] Closed fracture of distal end of right femur (HCC) [S72.401A] Periprosthetic fracture around internal prosthetic knee joint [M97.8XXA, Z96.659] Patient Active Problem List   Diagnosis Date Noted   Closed displaced trimalleolar fracture of left ankle 03/15/2023   Closed fracture of distal end of right femur (HCC) 03/14/2023   Closed fibular fracture 03/14/2023   Anxiety state 01/04/2023   Constipation 12/09/2022   Debility 12/04/2022   Class 2 obesity 11/27/2022   A-fib (HCC) 11/26/2022   Chronic diastolic congestive heart failure (HCC) 11/26/2022   Primary osteoarthritis of both hands 08/12/2021   Primary  osteoarthritis of left knee 08/12/2021   Primary osteoarthritis of both feet 08/12/2021   Dyslipidemia 07/10/2021   Essential hypertension 07/10/2021   Type 1 diabetes mellitus on insulin therapy (HCC) 07/10/2021   Osteopenia of multiple sites 07/10/2021   Vaginal irritation 12/15/2017   Varicose veins of left lower extremity with complications 09/06/2017   Unilateral primary osteoarthritis, left knee 08/02/2017   PCP:  Garlan Fillers, MD Pharmacy:    [facility] [address] [place] - [address] [place] [address] Phone: [phone] Fax: [phone]  [facility] [address] [place] [address] Phone: [phone] Fax: [phone]     Social Determinants of Health (SDOH) Social History: SDOH Screenings   Food Insecurity: No Food Insecurity (12/01/2022)  Housing: Low Risk  (12/01/2022)  Transportation Needs: No Transportation Needs (12/01/2022)  Utilities: Not At Risk (12/01/2022)  Tobacco Use: Low Risk  (03/16/2023)   SDOH Interventions:     Readmission Risk Interventions    12/04/2022   12:42 PM  Readmission Risk Prevention Plan  Transportation Screening Complete  PCP or Specialist Appt within 5-7 Days Complete  Home  Care Screening Complete  Medication Review (RN CM) Complete

## 2023-03-16 NOTE — Progress Notes (Signed)
Progress Note   Patient: [name] AVW:[id number] DOB: 10/27/1951 DOA: 03/14/2023     2 DOS: the patient was seen and examined on 03/16/2023   Brief hospital course: 72 year old female history of chronic A-fib not on anticoagulation due to prior history of subarachnoid hemorrhage, chronic diastolic heart failure, hypertension, type 1 diabetes on chronic insulin, obesity, hyperlipidemia presents to the ER today after having a ground-level fall.  Patient was evaluated and determined to have right femur distal fracture periprosthetic and also a left trimalleolar fracture.  Patient evaluated by orthopedic surgery underwent open reduction internal fixation of both fractures today's postoperative day 1.  Assessment and Plan: * Closed fracture of distal end of right femur (HCC) Admit to telemetry bed. NPO after MN.  Orthopedic surgeon consulted, patient underwent open reduction internal fixation of the periprosthetic distal femur fracture, today is postoperative day 1 Continue pain management.  Leukocytosis: Appears to be most likely reactive in nature - Trended down from 16.7-11.9. - Continue to monitor - Still no signs of infection.  Patient received cefazolin prior to surgery.  Closed fibular fracture/trimalleolar fracture  NPO after MN. Ortho to see in the AM. Oxycodone for pain prn. IV morphine for severe pain -Status post open reduction internal fixation of the trimalleolar fracture postoperative day 1  Class 2 obesity Chronic.  Chronic diastolic congestive heart failure (HCC) Stable. Patient is currently on gentle IV fluids for 12 hours due to trending up of creatinine  A-fib (HCC) Continue cardizem. Not on systemic anticoagulation due to prior hx of subarachnoid hemorrhage.  Type 1 diabetes mellitus on insulin therapy (HCC) Continue with insulin. Pt was hypoglycemic on arrival. Gentle  IVF with dextrose.  Blood sugars not controlled, discontinue dextrose containing fluid which  was started due to hyperglycemia.  Currently increased the dose of Lantus and placed on Premeal insulin along with sliding scale.  Essential hypertension Stable.  Dyslipidemia Continue pravastatin 40 mg qday.        Subjective: Patient was seen and examined at bedside today.  Today's postoperative day 1 after surgery.  Patient reports pain is fairly well-controlled. Physical Exam: Vitals:   03/15/23 1844 03/15/23 2007 03/16/23 0728 03/16/23 1345  BP: 104/66 95/74 124/75 107/60  Pulse: 99 88 76   Resp: 15  18   Temp: 97.6 F (36.4 C) 98.7 F (37.1 C) 97.7 F (36.5 C)   TempSrc: Oral Oral Oral   SpO2: 99% 99% 100%   Weight:      Height:       Constitutional:      General: She is not in acute distress.    Appearance: She is obese. She is not toxic-appearing or diaphoretic.  HENT:     Head: Normocephalic and atraumatic.     Nose: Nose normal.  Eyes:     General: No scleral icterus. Cardiovascular:     Rate and Rhythm: Normal rate. Rhythm irregular.  Pulmonary:     Effort: Pulmonary effort is normal.     Breath sounds: Normal breath sounds.  Abdominal:     General: Abdomen is protuberant. Bowel sounds are normal. There is no distension.     Palpations: Abdomen is soft.  Musculoskeletal:     Comments: Left lower leg externally rotated and shortened.  Skin:    General: Skin is warm and dry.     Capillary Refill: Capillary refill takes less than 2 seconds.     Comments: Chronic LE venous stasis changes  Neurological:     Mental Status:  She is alert and oriented to person, place, and time.  Data Reviewed:  Blood sugars fairly well-controlled Patient has leukocytosis 16.7 Family Communication: Spoke to daughter at bedside.  Disposition: Status is: Inpatient Remains inpatient appropriate because: Due to femoral fracture, trimalleolar fracture  Planned Discharge Destination: Skilled nursing facility    Time spent: 35 minutes  Author: Harold Hedge,  MD 03/16/2023 2:26 PM  For on call review [email or web address].

## 2023-03-16 NOTE — Progress Notes (Signed)
Inpatient Rehab Admissions Coordinator:   Per therapy recommendations, patient was screened for CIR candidacy by Raneisha Bress, MS, CCC-SLP. At this time, Pt. is not yet at a level to tolerate the intensity of CIR; however,   Pt. may have potential to progress to becoming a potential CIR candidate, so CIR admissions team will follow and monitor for progress and participation with therapies and place consult order if Pt. appears to be an appropriate candidate. Please contact me with any questions.   Arnecia Ector, MS, CCC-SLP Rehab Admissions Coordinator  [phone] (celll) [phone] (office)  

## 2023-03-16 NOTE — Evaluation (Signed)
Physical Therapy Evaluation Patient Details Name: [name] MRN: [id number] DOB: December 02, 1950 Today's Date: 03/16/2023  History of Present Illness  72 year old female admitted 4/28 for fall s/p ORIF right periprosthetic distal femur fracture, ORIF left trimalleolar ankle fracture. PMHx: of chronic A-fib not on anticoagulation due to prior history of subarachnoid hemorrhage, chronic diastolic heart failure, hypertension, type 1 diabetes on chronic insulin, obesity, hyperlipidemia.  Clinical Impression  Patient is s/p above surgery resulting in functional limitations due to the deficits listed below (see PT Problem List). Was mod I with mobility in her home PTA, using a RW. Recent stay at [facility] earlier this year with good progress and outcomes. Needs some assist in/out of shower but can perform ALDs on her own otherwise per her report. Lives with husband and daughter who can provide assistance. Pt was mod assist to rise to EOB, max to return to bed, sat EOB unsupported. Total assist to attempt sit<>stand without successfully rising from bed today with +1 assist. Patient will benefit from acute skilled PT to increase their independence and safety with mobility to facilitate discharge.    Will request [facility] take a look at her case and see if she progresses during her acute stay. They are familiar with her from a visit earlier this year due to an unrelated admission. At current level, I anticipate SNF will be more appropriate. However, if she can show good strides in a short period of time with the acute team, perhaps [facility] would be able to rehabilitate her to a mod I level with transfers and a wheelchair since she will be NWB on LLE for quite some time. She is certainly willing to try her best and was eager to participate today.   Recommendations for follow up therapy are one component of a multi-disciplinary discharge planning process, led by the attending physician.  Recommendations may be updated based on  patient status, additional functional criteria and insurance authorization.  Follow Up Recommendations Can patient physically be transported by private vehicle: No     Assistance Recommended at Discharge Frequent or constant Supervision/Assistance  Patient can return home with the following  Two people to help with walking and/or transfers;Two people to help with bathing/dressing/bathroom;Assistance with cooking/housework;Assist for transportation;Help with stairs or ramp for entrance    Equipment Recommendations None recommended by PT  Recommendations for Other Services  Rehab consult    Functional Status Assessment Patient has had a recent decline in their functional status and demonstrates the ability to make significant improvements in function in a reasonable and predictable amount of time.     Precautions / Restrictions Precautions Precautions: Fall Restrictions Weight Bearing Restrictions: Yes RLE Weight Bearing: Weight bearing as tolerated LLE Weight Bearing: Non weight bearing      Mobility  Bed Mobility Overal bed mobility: Needs Assistance Bed Mobility: Supine to Sit, Sit to Supine     Supine to sit: Mod assist, HOB elevated Sit to supine: Max assist   General bed mobility comments: Mod assist for trunk support, light assist for RLE to EOB, good UE strength to pull through rail. Max assist for LE support back into bed and for repositioning. RUE limited range from shoulder to assist.    Transfers Overall transfer level: Needs assistance Equipment used: Rolling walker (2 wheels) Transfers: Sit to/from Stand Sit to Stand: From elevated surface, Total assist           General transfer comment: Attempted sit<>stand from EOB. Max cues for technique, WB precautions, and sequencing with  bed elevated. Pt unable to clear buttocks with total assist.    Ambulation/Gait                  Stairs            Wheelchair Mobility    Modified Rankin  (Stroke Patients Only)       Balance Overall balance assessment: Needs assistance Sitting-balance support: No upper extremity supported, Feet supported Sitting balance-Leahy Scale: Fair   Postural control: Posterior lean                                   Pertinent Vitals/Pain Pain Assessment Pain Assessment: 0-10 Pain Score: 0-No pain Pain Intervention(s): Monitored during session    Home Living Family/patient expects to be discharged to:: Private residence Living Arrangements: Spouse/significant other;Children Available Help at Discharge: Family;Available 24 hours/day Type of Home: House Home Access: Ramped entrance       Home Layout: One level Home Equipment: Grab bars - tub/shower;Toilet riser;Rolling Walker (2 wheels);Rollator (4 wheels);Shower seat;Adaptive equipment;Wheelchair - Engineer, technical sales - power      Prior Function Prior Level of Function : Needs assist;History of Falls (last six months)             Mobility Comments: Using RW in house primarily. uses w/c for medical appointments. ADLs Comments: Able to bath/dress herself; Occasional help getting in/out of shower.     Hand Dominance   Dominant Hand: Right    Extremity/Trunk Assessment   Upper Extremity Assessment Upper Extremity Assessment: Defer to OT evaluation    Lower Extremity Assessment Lower Extremity Assessment: RLE deficits/detail;LLE deficits/detail (Insensate bil Feet this morning) RLE Deficits / Details: Able to move volitionally RLE Sensation: decreased light touch LLE Deficits / Details: Able to move toes volitionally LLE Sensation: decreased light touch    Cervical / Trunk Assessment Cervical / Trunk Assessment: Normal  Communication   Communication: No difficulties  Cognition Arousal/Alertness: Awake/alert Behavior During Therapy: WFL for tasks assessed/performed Overall Cognitive Status: Within Functional Limits for tasks assessed                                           General Comments General comments (skin integrity, edema, etc.): Educated on precautions. VSS during session.    Exercises     Assessment/Plan    PT Assessment Patient needs continued PT services  PT Problem List Decreased strength;Decreased range of motion;Decreased activity tolerance;Decreased balance;Decreased mobility;Decreased knowledge of use of DME;Decreased knowledge of precautions;Cardiopulmonary status limiting activity;Obesity;Impaired sensation;Pain       PT Treatment Interventions DME instruction;Gait training;Functional mobility training;Therapeutic activities;Balance training;Therapeutic exercise;Neuromuscular re-education;Patient/family education;Wheelchair mobility training;Modalities    PT Goals (Current goals can be found in the Care Plan section)  Acute Rehab PT Goals Patient Stated Goal: Get well at rehab before going home PT Goal Formulation: With patient Time For Goal Achievement: 03/30/23 Potential to Achieve Goals: Good    Frequency Min 4X/week     Co-evaluation               AM-PAC PT "6 Clicks" Mobility  Outcome Measure Help needed turning from your back to your side while in a flat bed without using bedrails?: A Little Help needed moving from lying on your back to sitting on the side of a flat bed without using bedrails?: A Lot  Help needed moving to and from a bed to a chair (including a wheelchair)?: Total Help needed standing up from a chair using your arms (e.g., wheelchair or bedside chair)?: Total Help needed to walk in hospital room?: Total Help needed climbing 3-5 steps with a railing? : Total 6 Click Score: 9    End of Session Equipment Utilized During Treatment: Oxygen;Gait belt Activity Tolerance: Other (comment) (Limited by weakness) Patient left: in bed;with call bell/phone within reach;with bed alarm set Nurse Communication: Mobility status;Need for lift equipment (Maximove) PT Visit  Diagnosis: Muscle weakness (generalized) (M62.81);History of falling (Z91.81);Difficulty in walking, not elsewhere classified (R26.2);Pain Pain - Right/Left: Right Pain - part of body: Hip;Knee    Time: 0920-0951 PT Time Calculation (min) (ACUTE ONLY): 31 min   Charges:   PT Evaluation $PT Eval Moderate Complexity: 1 Mod PT Treatments $Therapeutic Activity: 8-22 mins        Kathlyn Sacramento, PT, DPT Physical Therapist Acute Rehabilitation Services [facility] [phone]   Berton Mount 03/16/2023, 10:14 AM

## 2023-03-16 NOTE — Progress Notes (Signed)
Orthopaedic Trauma Progress Note  SUBJECTIVE: Patient doing well this morning.  Currently sitting up in bed eating breakfast.  Pain well-controlled on current regimen.  Denies any numbness or tingling throughout bilateral lower extremities.  Has not been up out of bed since surgery.  No chest pain. No SOB. No nausea/vomiting. No other complaints.  Wanted to clarify her weightbearing status on bilateral lower extremities.  Her goal is to hopefully return home.  Would like to avoid rehab facility if possible.  OBJECTIVE:  Vitals:   03/15/23 2007 03/16/23 0728  BP: 95/74 124/75  Pulse: 88 76  Resp:    Temp: 98.7 F (37.1 C) 97.7 F (36.5 C)  SpO2: 99% 100%    General: Sitting up in bed eating breakfast, no acute distress.  Alert and oriented x 3 Respiratory: No increased work of breathing.  Right lower extremity: Dressing clean, dry, intact.  Tender about the knee as expected.  Notes some soreness throughout the calf which is baseline.  No significant areas of increased pain.  Ankle DF/PF intact.  Endorses sensation to light touch over the dorsal and plantar aspect of the foot.  Some darkening of the skin throughout the lower leg and ankle related to PAD. + DP pulse Left lower extremity: Well-padded, well-fitting short leg splint in place.  No significant tenderness above the splint.  Slightly decreased sensation to light touch over the plantar aspect of her toes.  Remainder of motor and sensory exam to the foot and ankle limited secondary to splint placement.  Toes feel slightly cool but equal to contralateral side  IMAGING: Stable post op imaging.   LABS:  Results for orders placed or performed during the hospital encounter of 03/14/23 (from the past 24 hour(s))  Surgical pcr screen     Status: None   Collection Time: 03/15/23  9:01 AM   Specimen: Nasal Mucosa; Nasal Swab  Result Value Ref Range   MRSA, PCR NEGATIVE NEGATIVE   Staphylococcus aureus NEGATIVE NEGATIVE  Glucose, capillary      Status: Abnormal   Collection Time: 03/15/23 11:26 AM  Result Value Ref Range   Glucose-Capillary 193 (H) 70 - 99 mg/dL  Glucose, capillary     Status: Abnormal   Collection Time: 03/15/23  3:13 PM  Result Value Ref Range   Glucose-Capillary 185 (H) 70 - 99 mg/dL  Glucose, capillary     Status: Abnormal   Collection Time: 03/15/23  5:58 PM  Result Value Ref Range   Glucose-Capillary 238 (H) 70 - 99 mg/dL  Glucose, capillary     Status: Abnormal   Collection Time: 03/15/23  7:56 PM  Result Value Ref Range   Glucose-Capillary 276 (H) 70 - 99 mg/dL  CBC     Status: Abnormal   Collection Time: 03/16/23  4:52 AM  Result Value Ref Range   WBC 11.9 (H) 4.0 - 10.5 K/uL   RBC 4.10 3.87 - 5.11 MIL/uL   Hemoglobin 12.0 12.0 - 15.0 g/dL   HCT 69.6 29.5 - 28.4 %   MCV 91.0 80.0 - 100.0 fL   MCH 29.3 26.0 - 34.0 pg   MCHC 32.2 30.0 - 36.0 g/dL   RDW 13.2 (H) 44.0 - 10.2 %   Platelets 147 (L) 150 - 400 K/uL   nRBC 0.0 0.0 - 0.2 %  Basic metabolic panel     Status: Abnormal   Collection Time: 03/16/23  4:52 AM  Result Value Ref Range   Sodium 131 (L) 135 - 145  mmol/L   Potassium 4.9 3.5 - 5.1 mmol/L   Chloride 99 98 - 111 mmol/L   CO2 25 22 - 32 mmol/L   Glucose, Bld 477 (H) 70 - 99 mg/dL   BUN 37 (H) 8 - 23 mg/dL   Creatinine, Ser 1.61 (H) 0.44 - 1.00 mg/dL   Calcium 8.7 (L) 8.9 - 10.3 mg/dL   GFR, Estimated 44 (L) >60 mL/min   Anion gap 7 5 - 15  Glucose, capillary     Status: Abnormal   Collection Time: 03/16/23  7:29 AM  Result Value Ref Range   Glucose-Capillary 429 (H) 70 - 99 mg/dL    ASSESSMENT: [name] is a 72 y.o. female, 1 Day Post-Op s/p OPEN REDUCTION INTERNAL FIXATION RIGHT DISTAL FEMUR FRACTURE OPEN REDUCTION INTERNAL FIXATION LEFT ANKLE FRACTURE  CV/Blood loss: Acute blood loss anemia, Hgb 12.0 this morning. Hemodynamically stable  PLAN: Weightbearing: WBAT RLE.  NWB LLE ROM:  RLE -okay for unrestricted hip, knee, ankle ROM LLE -okay for knee and hip  motion.  Maintain splint Incisional and dressing care:  RLE - reinforce dressing as needed  LLE - Maintain splint Showering: Okay to begin getting RLE incisions wet 03/18/2023.  Keep LLE splint covered and dry when showering Orthopedic device(s): Splint LLE Pain management:  1. Tylenol 650 mg q 6 hours PRN 2. Robaxin 500 mg q 6 hours PRN 3. Percocet 5-325 mg q 4 hours PRN 4. Morphine 2 mg q 2 hours PRN 5. Neurontin 100 mg QHS VTE prophylaxis: Lovenox, SCDs ID:  Ancef 2gm post op Foley/Lines:  No foley, KVO IVFs Impediments to Fracture Healing: Vitamin D level pending, will start supplementation as indicated Dispo: PT/OT evaluation today, dispo pending.  Plan to remove RLE dressing tomorrow 03/17/2023.   D/C recommendations: - Percocet, Robaxin for pain control - Eliquis 2.5 mg BID x 30 days for DVT prophylaxis - Possible need for Vit D supplementation  Follow - up plan: 2 weeks after discharge for wound check and repeat x-rays   Contact information:  Truitt Merle MD, Thyra Breed PA-C. After hours and holidays please check [email or web address] for group call information for [facility]   Thompson Caul, PA-C [phone] (office) [email or web address]

## 2023-03-16 NOTE — Inpatient Diabetes Management (Addendum)
Inpatient Diabetes Program Recommendations  AACE/ADA: New Consensus Statement on Inpatient Glycemic Control (2015)  Target Ranges:  Prepandial:   less than 140 mg/dL      Peak postprandial:   less than 180 mg/dL (1-2 hours)      Critically ill patients:  140 - 180 mg/dL   Lab Results  Component Value Date   GLUCAP 429 (H) 03/16/2023   HGBA1C 7.4 (H) 11/27/2022    Review of Glycemic Control  Latest Reference Range & Units 03/15/23 15:13 03/15/23 17:58 03/15/23 19:56 03/16/23 07:29  Glucose-Capillary 70 - 99 mg/dL 161 (H) 096 (H) 045 (H) 429 (H)  (H): Data is abnormally high  Diabetes history: DM1(does not make insulin.  Needs correction, basal and meal coverage)  Outpatient Diabetes medications: Lantus 8-12 units QHS, Humalog 5 units TID  Current orders for Inpatient glycemic control: Semglee 8 units QHS, Novolog 0-9 units TID and 0-5 units QHS, received Decadron yesterday  Inpatient Diabetes Program Recommendations:    Noted D5 @ 50 ml/hr has been discontinued.  Please consider:  Semglee 6 units BID (0.15 units x 84.8 kg) Novolog 3 units TID with meals if she consumes at least 50%  Spoke with patient at bedside.  She was diagnosed with T1DM at age 23.  She confirms above home medications.  She uses the Dexcom G7 CGM at home.    Will continue to follow while inpatient.  Thank you, Dulce Sellar, MSN, CDCES Diabetes Coordinator Inpatient Diabetes Program [phone] (team pager from 8a-5p)

## 2023-03-16 NOTE — Evaluation (Signed)
Occupational Therapy Evaluation Patient Details Name: [name] MRN: [id number] DOB: 08-May-1951 Today's Date: 03/16/2023   History of Present Illness 72 year old female admitted 4/28 for fall s/p ORIF right periprosthetic distal femur fracture, ORIF left trimalleolar ankle fracture. PMHx: of chronic A-fib not on anticoagulation due to prior history of subarachnoid hemorrhage, chronic diastolic heart failure, hypertension, type 1 diabetes on chronic insulin, obesity, hyperlipidemia.   Clinical Impression   Pt s/p above mentioned surgery post fall at home. Pt was living at home with husband/daughter, PLOF mod I for transfers, bathing, dressing, uses RW, sock aide, shower chair, long handled sponge for ADLs/mobility. Pt familiar using DME/adapative equipment, she reports owning a STEDY and a power wheelchair, which was used to recover from prior knee surgery. Pt currently not able to stand using STEDY, unable to maintain NWB precautions on LLE, difficulty standing with RLE only. Pt would benefit greatly from post acute OT 3hrs/day.  P.t will be followed acutely by OT during stay.     Recommendations for follow up therapy are one component of a multi-disciplinary discharge planning process, led by the attending physician.  Recommendations may be updated based on patient status, additional functional criteria and insurance authorization.   Assistance Recommended at Discharge Frequent or constant Supervision/Assistance  Patient can return home with the following Two people to help with walking and/or transfers;Two people to help with bathing/dressing/bathroom;Assistance with cooking/housework;Assist for transportation;Help with stairs or ramp for entrance    Functional Status Assessment  Patient has had a recent decline in their functional status and demonstrates the ability to make significant improvements in function in a reasonable and predictable amount of time.  Equipment Recommendations  None  recommended by OT    Recommendations for Other Services       Precautions / Restrictions Precautions Precautions: Fall Restrictions Weight Bearing Restrictions: Yes RLE Weight Bearing: Weight bearing as tolerated LLE Weight Bearing: Non weight bearing      Mobility Bed Mobility Overal bed mobility: Needs Assistance Bed Mobility: Supine to Sit, Sit to Supine     Supine to sit: Mod assist, HOB elevated Sit to supine: Max assist   General bed mobility comments: Pt requires HOB elevated, bed rails, and HHA for supine to sit. Requires assistance with BLEs sit to supine. total for scotting back in bed.    Transfers Overall transfer level: Needs assistance Equipment used:  (STEDY) Transfers: Sit to/from Stand Sit to Stand: From elevated surface, Total assist, +2 safety/equipment           General transfer comment: attempted sit to stand with use of STEDY and elevated surface, unable to maintain NWB precautions LLE and RLE not strong enough alone to stand.      Balance Overall balance assessment: Needs assistance Sitting-balance support: No upper extremity supported, Feet supported Sitting balance-Leahy Scale: Fair                                     ADL either performed or assessed with clinical judgement   ADL Overall ADL's : Needs assistance/impaired Eating/Feeding: Set up   Grooming: Set up;Sitting   Upper Body Bathing: Set up;With adaptive equipment   Lower Body Bathing: Moderate assistance;With adaptive equipment   Upper Body Dressing : Set up   Lower Body Dressing: Maximal assistance;With adaptive equipment;Sitting/lateral leans   Toilet Transfer: Total assistance;+2 for safety/equipment   Toileting- Clothing Manipulation and Hygiene: Maximal assistance   Tub/  Shower Transfer: Total assistance;+2 for safety/equipment   Functional mobility during ADLs: Total assistance;+2 for safety/equipment General ADL Comments: Pt unable to stand  with use of STEDY and physical assist, unable to perform LB ADLs     Vision Baseline Vision/History: 1 Wears glasses;3 Glaucoma (blind in R eye) Ability to See in Adequate Light: 2 Moderately impaired Patient Visual Report: No change from baseline       Perception     Praxis      Pertinent Vitals/Pain Pain Assessment Pain Assessment: 0-10 Pain Score: 8  Pain Location: R/L leg/feet Pain Descriptors / Indicators: Aching, Discomfort Pain Intervention(s): Monitored during session, Premedicated before session     Hand Dominance Right   Extremity/Trunk Assessment Upper Extremity Assessment Upper Extremity Assessment: RUE deficits/detail RUE Deficits / Details: R shoulder stiffness, decreased ROM with flexion/ABD. Pain with reaching or overhead activities RUE: Shoulder pain with ROM RUE Sensation: WNL RUE Coordination: decreased gross motor   Lower Extremity Assessment Lower Extremity Assessment: Defer to PT evaluation RLE Deficits / Details: Able to move volitionally RLE Sensation: decreased light touch LLE Deficits / Details: Able to move toes volitionally LLE Sensation: decreased light touch   Cervical / Trunk Assessment Cervical / Trunk Assessment: Normal   Communication Communication Communication: No difficulties   Cognition Arousal/Alertness: Awake/alert Behavior During Therapy: WFL for tasks assessed/performed Overall Cognitive Status: Within Functional Limits for tasks assessed                                       General Comments  Educated on precautions. VSS during session.    Exercises     Shoulder Instructions      Home Living Family/patient expects to be discharged to:: Private residence Living Arrangements: Spouse/significant other;Children Available Help at Discharge: Family;Available 24 hours/day Type of Home: House Home Access: Ramped entrance     Home Layout: One level     Bathroom Shower/Tub: Company secretary: Handicapped height Bathroom Accessibility: Yes   Home Equipment: Grab bars - tub/shower;Toilet riser;Rolling Walker (2 wheels);Rollator (4 wheels);Shower seat;Adaptive equipment;Wheelchair - Engineer, technical sales - power (STEDY) Adaptive Equipment: Long-handled sponge;Sock aid;Reacher Additional Comments: Pt lives at home with husband and daughter. Pt uses RW and has a power chair if needed, also has a STEDY that she used to use.      Prior Functioning/Environment Prior Level of Function : Needs assist;History of Falls (last six months)       Physical Assist : ADLs (physical);Mobility (physical) Mobility (physical): Gait ADLs (physical): Dressing;Bathing Mobility Comments: Using RW in house primarily. uses w/c for medical appointments. ADLs Comments: uses long handled brush for bathing, sock aide for dressing, requires asstance for shoes. Occasional help getting in/out of shower.        OT Problem List: Decreased strength;Decreased range of motion;Decreased activity tolerance;Impaired balance (sitting and/or standing);Impaired UE functional use;Pain      OT Treatment/Interventions: Self-care/ADL training;Therapeutic exercise;DME and/or AE instruction;Therapeutic activities;Patient/family education    OT Goals(Current goals can be found in the care plan section) Acute Rehab OT Goals Patient Stated Goal: to improve strength and standing balance, returning home OT Goal Formulation: With patient Time For Goal Achievement: 03/26/23 Potential to Achieve Goals: Good  OT Frequency: Min 2X/week    Co-evaluation              AM-PAC OT "6 Clicks" Daily Activity     Outcome Measure  Help from another person eating meals?: A Little Help from another person taking care of personal grooming?: A Little Help from another person toileting, which includes using toliet, bedpan, or urinal?: A Lot Help from another person bathing (including washing, rinsing, drying)?: A  Lot Help from another person to put on and taking off regular upper body clothing?: A Little Help from another person to put on and taking off regular lower body clothing?: A Lot 6 Click Score: 15   End of Session Equipment Utilized During Treatment: Gait belt;Rolling walker (2 wheels) (STEDY) Nurse Communication: Mobility status;Need for lift equipment  Activity Tolerance: Patient tolerated treatment well Patient left: in bed;with call bell/phone within reach;with bed alarm set  OT Visit Diagnosis: Unsteadiness on feet (R26.81);Other abnormalities of gait and mobility (R26.89);Repeated falls (R29.6);Muscle weakness (generalized) (M62.81);Pain Pain - Right/Left: Left Pain - part of body: Leg                Time: 1610-9604 OT Time Calculation (min): 47 min Charges:  OT General Charges $OT Visit: 1 Visit OT Evaluation $OT Eval Moderate Complexity: 1 Mod OT Treatments $Self Care/Home Management : 8-22 mins $Therapeutic Activity: 8-22 mins  Allyn Bertoni, OTR/L   Alexis Goodell 03/16/2023, 11:21 AM

## 2023-03-17 DIAGNOSIS — S72401S Unspecified fracture of lower end of right femur, sequela: Secondary | ICD-10-CM | POA: Diagnosis not present

## 2023-03-17 DIAGNOSIS — S72401D Unspecified fracture of lower end of right femur, subsequent encounter for closed fracture with routine healing: Secondary | ICD-10-CM

## 2023-03-17 LAB — CBC WITH DIFFERENTIAL/PLATELET
Abs Immature Granulocytes: 0.04 10*3/uL (ref 0.00–0.07)
Basophils Absolute: 0.1 10*3/uL (ref 0.0–0.1)
Basophils Relative: 1 %
Eosinophils Absolute: 0.3 10*3/uL (ref 0.0–0.5)
Eosinophils Relative: 3 %
HCT: 36 % (ref 36.0–46.0)
Hemoglobin: 11.2 g/dL — ABNORMAL LOW (ref 12.0–15.0)
Immature Granulocytes: 0 %
Lymphocytes Relative: 19 %
Lymphs Abs: 2.3 10*3/uL (ref 0.7–4.0)
MCH: 28.8 pg (ref 26.0–34.0)
MCHC: 31.1 g/dL (ref 30.0–36.0)
MCV: 92.5 fL (ref 80.0–100.0)
Monocytes Absolute: 1.3 10*3/uL — ABNORMAL HIGH (ref 0.1–1.0)
Monocytes Relative: 11 %
Neutro Abs: 8.2 10*3/uL — ABNORMAL HIGH (ref 1.7–7.7)
Neutrophils Relative %: 66 %
Platelets: 163 10*3/uL (ref 150–400)
RBC: 3.89 MIL/uL (ref 3.87–5.11)
RDW: 16.1 % — ABNORMAL HIGH (ref 11.5–15.5)
WBC: 12.2 10*3/uL — ABNORMAL HIGH (ref 4.0–10.5)
nRBC: 0 % (ref 0.0–0.2)

## 2023-03-17 LAB — COMPREHENSIVE METABOLIC PANEL
ALT: 10 U/L (ref 0–44)
AST: 24 U/L (ref 15–41)
Albumin: 3 g/dL — ABNORMAL LOW (ref 3.5–5.0)
Alkaline Phosphatase: 86 U/L (ref 38–126)
Anion gap: 9 (ref 5–15)
BUN: 45 mg/dL — ABNORMAL HIGH (ref 8–23)
CO2: 27 mmol/L (ref 22–32)
Calcium: 9.2 mg/dL (ref 8.9–10.3)
Chloride: 97 mmol/L — ABNORMAL LOW (ref 98–111)
Creatinine, Ser: 1.32 mg/dL — ABNORMAL HIGH (ref 0.44–1.00)
GFR, Estimated: 42 mL/min — ABNORMAL LOW (ref >60)
Glucose, Bld: 122 mg/dL — ABNORMAL HIGH (ref 70–99)
Potassium: 5 mmol/L (ref 3.5–5.1)
Sodium: 133 mmol/L — ABNORMAL LOW (ref 135–145)
Total Bilirubin: 0.3 mg/dL (ref 0.3–1.2)
Total Protein: 5.9 g/dL — ABNORMAL LOW (ref 6.5–8.1)

## 2023-03-17 LAB — GLUCOSE, CAPILLARY
Glucose-Capillary: 171 mg/dL — ABNORMAL HIGH (ref 70–99)
Glucose-Capillary: 141 mg/dL — ABNORMAL HIGH (ref 70–99)
Glucose-Capillary: 112 mg/dL — ABNORMAL HIGH (ref 70–99)
Glucose-Capillary: 139 mg/dL — ABNORMAL HIGH (ref 70–99)

## 2023-03-17 MED ORDER — SODIUM CHLORIDE 0.9 % IV BOLUS
500.0000 mL | Freq: Once | INTRAVENOUS | Status: AC
  Administered 2023-03-17: 500 mL via INTRAVENOUS

## 2023-03-17 MED ORDER — HYDRALAZINE HCL 20 MG/ML IJ SOLN: 10.0000 mg | INTRAMUSCULAR | Status: DC | PRN

## 2023-03-17 MED ORDER — TRAZODONE HCL 50 MG PO TABS
50.0000 mg | ORAL_TABLET | Freq: Every evening | ORAL | Status: DC | PRN
  Filled 2023-03-17: qty 1

## 2023-03-17 MED ORDER — GUAIFENESIN 100 MG/5ML PO LIQD: 5.0000 mL | ORAL | Status: DC | PRN

## 2023-03-17 MED ORDER — TRAMADOL HCL 50 MG PO TABS: 50.0000 mg | ORAL_TABLET | Freq: Four times a day (QID) | ORAL | Status: DC | PRN

## 2023-03-17 MED ORDER — IPRATROPIUM-ALBUTEROL 0.5-2.5 (3) MG/3ML IN SOLN: 3.0000 mL | RESPIRATORY_TRACT | Status: DC | PRN

## 2023-03-17 MED ORDER — HYDROCODONE-ACETAMINOPHEN 5-325 MG PO TABS: 1.0000 | ORAL_TABLET | Freq: Four times a day (QID) | ORAL | Status: DC | PRN

## 2023-03-17 MED ORDER — MENTHOL 3 MG MT LOZG: 1.0000 | LOZENGE | OROMUCOSAL | Status: DC | PRN

## 2023-03-17 NOTE — Progress Notes (Signed)
Orthopaedic Trauma Progress Note  SUBJECTIVE: Patient doing well this morning.   Patient had a difficult time mobilizing yesterday due to pain.  She required Percocet following PT session, but nursing noted patient became diaphoretic and did not tolerate medication well. We will switch her to Tramadol today for pain not controlled with Tylenol.  Patient denies any numbness or tingling throughout bilateral lower extremities.  No chest pain. No SOB. No nausea/vomiting. No other complaints. Her goal is to hopefully return home.  She is ok with CIR but wants to avoid SNF if possible.  OBJECTIVE:  Vitals:   03/17/23 0738 03/17/23 1359  BP: (!) 122/59 (!) 121/57  Pulse:  83  Resp: 18   Temp: (!) 97.5 F (36.4 C) 98.6 F (37 C)  SpO2: 95% 94%    General: Sitting up in bed eating breakfast, no acute distress.  Alert and oriented x 3 Respiratory: No increased work of breathing.  Right lower extremity: Dressing removed, incisions clean, dry, intact.  Tender about the knee as expected.  Notes some soreness throughout the calf which is baseline.  No significant areas of increased pain.  Ankle DF/PF intact.  Endorses sensation to light touch over the dorsal and plantar aspect of the foot.  Some darkening of the skin throughout the lower leg and ankle related to PAD. + DP pulse Left lower extremity: Well-padded, well-fitting short leg splint in place.  No significant tenderness above the splint.  Slightly decreased sensation to light touch over the plantar aspect of her toes.  Remainder of motor and sensory exam to the foot and ankle limited secondary to splint placement.  Toes feel slightly cool but equal to contralateral side  IMAGING: Stable post op imaging.   LABS:  Results for orders placed or performed during the hospital encounter of 03/14/23 (from the past 24 hour(s))  Glucose, capillary     Status: Abnormal   Collection Time: 03/16/23  4:08 PM  Result Value Ref Range   Glucose-Capillary 352  (H) 70 - 99 mg/dL  Glucose, capillary     Status: Abnormal   Collection Time: 03/16/23  9:37 PM  Result Value Ref Range   Glucose-Capillary 210 (H) 70 - 99 mg/dL  CBC with Differential/Platelet     Status: Abnormal   Collection Time: 03/17/23  1:54 AM  Result Value Ref Range   WBC 12.2 (H) 4.0 - 10.5 K/uL   RBC 3.89 3.87 - 5.11 MIL/uL   Hemoglobin 11.2 (L) 12.0 - 15.0 g/dL   HCT 04.5 40.9 - 81.1 %   MCV 92.5 80.0 - 100.0 fL   MCH 28.8 26.0 - 34.0 pg   MCHC 31.1 30.0 - 36.0 g/dL   RDW 91.4 (H) 78.2 - 95.6 %   Platelets 163 150 - 400 K/uL   nRBC 0.0 0.0 - 0.2 %   Neutrophils Relative % 66 %   Neutro Abs 8.2 (H) 1.7 - 7.7 K/uL   Lymphocytes Relative 19 %   Lymphs Abs 2.3 0.7 - 4.0 K/uL   Monocytes Relative 11 %   Monocytes Absolute 1.3 (H) 0.1 - 1.0 K/uL   Eosinophils Relative 3 %   Eosinophils Absolute 0.3 0.0 - 0.5 K/uL   Basophils Relative 1 %   Basophils Absolute 0.1 0.0 - 0.1 K/uL   Immature Granulocytes 0 %   Abs Immature Granulocytes 0.04 0.00 - 0.07 K/uL  Comprehensive metabolic panel     Status: Abnormal   Collection Time: 03/17/23  1:54 AM  Result  Value Ref Range   Sodium 133 (L) 135 - 145 mmol/L   Potassium 5.0 3.5 - 5.1 mmol/L   Chloride 97 (L) 98 - 111 mmol/L   CO2 27 22 - 32 mmol/L   Glucose, Bld 122 (H) 70 - 99 mg/dL   BUN 45 (H) 8 - 23 mg/dL   Creatinine, Ser 7.82 (H) 0.44 - 1.00 mg/dL   Calcium 9.2 8.9 - 95.6 mg/dL   Total Protein 5.9 (L) 6.5 - 8.1 g/dL   Albumin 3.0 (L) 3.5 - 5.0 g/dL   AST 24 15 - 41 U/L   ALT 10 0 - 44 U/L   Alkaline Phosphatase 86 38 - 126 U/L   Total Bilirubin 0.3 0.3 - 1.2 mg/dL   GFR, Estimated 42 (L) >60 mL/min   Anion gap 9 5 - 15  Glucose, capillary     Status: Abnormal   Collection Time: 03/17/23  7:27 AM  Result Value Ref Range   Glucose-Capillary 171 (H) 70 - 99 mg/dL  Glucose, capillary     Status: Abnormal   Collection Time: 03/17/23 11:45 AM  Result Value Ref Range   Glucose-Capillary 141 (H) 70 - 99 mg/dL     ASSESSMENT: [name] is a 72 y.o. female, 2 Days Post-Op s/p OPEN REDUCTION INTERNAL FIXATION RIGHT DISTAL FEMUR FRACTURE OPEN REDUCTION INTERNAL FIXATION LEFT ANKLE FRACTURE  CV/Blood loss: Acute blood loss anemia, Hgb 11.2 this morning. Hemodynamically stable  PLAN: Weightbearing: WBAT RLE.  NWB LLE ROM:  RLE -okay for unrestricted hip, knee, ankle ROM LLE -okay for knee and hip motion.  Maintain splint Incisional and dressing care:  RLE -okay to leave incisions open to air LLE - Maintain splint Showering: Okay to begin getting RLE incisions wet 03/18/2023.  Keep LLE splint covered and dry when showering Orthopedic device(s): Splint LLE Pain management:  1. Tylenol 650 mg q 6 hours PRN 2. Robaxin 500 mg q 6 hours PRN 3. Tramadol 50-100 mg q 6 hours PRN 4. Morphine 2 mg q 2 hours PRN 5. Neurontin 100 mg QHS VTE prophylaxis: Lovenox, SCDs ID:  Ancef 2gm post op completed Foley/Lines:  No foley, KVO IVFs Impediments to Fracture Healing: Vitamin D level 45, no additional supplementation needed  Dispo: PT/OT evaluation ongoing, dispo pending.  Patient noted to not currently be at a level to tolerate CIR intensity, they are recommending SNF.  Patient okay for discharge from ortho standpoint once cleared by medicine team and therapies.  D/C recommendations: -Tramadol, Robaxin for pain control - Eliquis 2.5 mg BID x 30 days for DVT prophylaxis -No additional need for Vit D supplementation  Follow - up plan: 2 weeks after discharge for wound check, LLE splint removal, and repeat x-rays   Contact information:  Truitt Merle MD, Thyra Breed PA-C. After hours and holidays please check [email or web address] for group call information for [facility]   Thompson Caul, PA-C [phone] (office) [email or web address]

## 2023-03-17 NOTE — Progress Notes (Addendum)
  Inpatient Rehabilitation Admissions Coordinator   I met with patient at bedside and then contacted her daughter, Victorino Dike, by phone. Discussed with Dr Natale Lay of CIR. Patient was admitted to [facility] 1/24 and her LOS was 33 days.  Dr Natale Lay is recommending longer term rehab than we can offer at this time. As per our notations 4/30, not at a level to tolerate the intensity of CIR. We also can not offer the length of stay at CIR that she will require prior to returning home. I will notify acute team and TOC. We sill sign off at this time.  Ottie Glazier, RN, MSN Rehab Admissions Coordinator [phone] 03/17/2023 11:55 AM

## 2023-03-17 NOTE — TOC Progression Note (Signed)
Transition of Care [facility]) - Progression Note    Patient Details  Name: [name] MRN: [id number] Date of Birth: 24-Aug-1951  [facility]) CM/SW Contact  Lorri Frederick, LCSW Phone Number: 03/17/2023, 10:15 AM  Clinical Narrative:   CSW received call from pt daughter Victorino Dike asking about DC plan.  Discussed recommendation for CIR.  Per jennifer, pt was at CIR about a month ago and they would like to pursue CIR again.  Pt currently being followed by CIR for possible admit.      Expected Discharge Plan: IP Rehab Facility Barriers to Discharge: Continued Medical Work up  Expected Discharge Plan and Services   Discharge Planning Services: CM Consult   Living arrangements for the past 2 months: Single Family Home                                       Social Determinants of Health (SDOH) Interventions SDOH Screenings   Food Insecurity: No Food Insecurity (12/01/2022)  Housing: Low Risk  (12/01/2022)  Transportation Needs: No Transportation Needs (12/01/2022)  Utilities: Not At Risk (12/01/2022)  Tobacco Use: Low Risk  (03/16/2023)    Readmission Risk Interventions    12/04/2022   12:42 PM  Readmission Risk Prevention Plan  Transportation Screening Complete  PCP or Specialist Appt within 5-7 Days Complete  Home Care Screening Complete  Medication Review (RN CM) Complete

## 2023-03-17 NOTE — TOC Progression Note (Signed)
Transition of Care [facility]) - Progression Note    Patient Details  Name: [name] MRN: [id number] Date of Birth: 1951/01/04  Transition [facility] Care [facility]) CM/SW Contact  Lawerance Sabal, RN Phone Number: 03/17/2023, 3:15 PM  Clinical Narrative:     Sherron Monday w patient's daughter Victorino Dike over the phone. She states that she was aware that CIR declined admission and we discussed options going forward.  She was agreeable to [facility] IR I spoke a Pam [phone], and clinicals have been faxed to [facility] at [phone].  Victorino Dike states that she ia agreeable to SNF if [facility] IR doesn't work out. Her first preference is [facility] and her other choices are [facility], [facility], [facility], and [facility].  CSW has faxed her out      Cartelli,jennifer Daughter   [phone]     Expected Discharge Plan: IP Rehab Facility Barriers to Discharge: Continued Medical Work up  Expected Discharge Plan and Services   Discharge Planning Services: CM Consult   Living arrangements for the past 2 months: Single Family Home                                       Social Determinants of Health (SDOH) Interventions SDOH Screenings   Food Insecurity: No Food Insecurity (12/01/2022)  Housing: Low Risk  (12/01/2022)  Transportation Needs: No Transportation Needs (12/01/2022)  Utilities: Not At Risk (12/01/2022)  Tobacco Use: Low Risk  (03/16/2023)    Readmission Risk Interventions    12/04/2022   12:42 PM  Readmission Risk Prevention Plan  Transportation Screening Complete  PCP or Specialist Appt within 5-7 Days Complete  Home Care Screening Complete  Medication Review (RN CM) Complete

## 2023-03-17 NOTE — NC FL2 (Signed)
[place] MEDICAID FL2 LEVEL OF CARE FORM     IDENTIFICATION  Patient Name: [name] Birthdate: 08-Nov-1951 Sex: female Admission Date (Current Location): 03/14/2023  [facility] and [place] Number:  Producer, television/film/video and Address:  [facility], [address] [place], [place] [address]      Provider Number: [id number]  Attending Physician Name and Address:  Dimple Nanas, MD  Relative Name and Phone Number:  Maietta,Terry Spouse   [phone]    Current Level of Care: Hospital Recommended Level of Care: Skilled Nursing Facility Prior Approval Number:    Date Approved/Denied:   PASRR Number: [id number]  Discharge Plan: SNF    Current Diagnoses: Patient Active Problem List   Diagnosis Date Noted   Closed displaced trimalleolar fracture of left ankle 03/15/2023   Closed fracture of distal end of right femur (HCC) 03/14/2023   Closed fibular fracture 03/14/2023   Anxiety state 01/04/2023   Constipation 12/09/2022   Debility 12/04/2022   Class 2 obesity 11/27/2022   A-fib (HCC) 11/26/2022   Chronic diastolic congestive heart failure (HCC) 11/26/2022   Primary osteoarthritis of both hands 08/12/2021   Primary osteoarthritis of left knee 08/12/2021   Primary osteoarthritis of both feet 08/12/2021   Dyslipidemia 07/10/2021   Essential hypertension 07/10/2021   Type 1 diabetes mellitus on insulin therapy (HCC) 07/10/2021   Osteopenia of multiple sites 07/10/2021   Vaginal irritation 12/15/2017   Varicose veins of left lower extremity with complications 09/06/2017   Unilateral primary osteoarthritis, left knee 08/02/2017    Orientation RESPIRATION BLADDER Height & Weight     Self, Time, Situation, Place  Normal Incontinent Weight: 206 lb 9.1 oz (93.7 kg) Height:  5\' 7"  (170.2 cm)  BEHAVIORAL SYMPTOMS/MOOD NEUROLOGICAL BOWEL NUTRITION STATUS      Incontinent Diet (see discharge summary)  AMBULATORY STATUS COMMUNICATION OF NEEDS Skin    Total Care Verbally Surgical wounds                       Personal Care Assistance Level of Assistance  Bathing, Feeding, Dressing Bathing Assistance: Limited assistance Feeding assistance: Limited assistance Dressing Assistance: Maximum assistance     Functional Limitations Info  Sight, Hearing, Speech Sight Info: Adequate Hearing Info: Adequate Speech Info: Adequate    SPECIAL CARE FACTORS FREQUENCY  PT (By licensed PT), OT (By licensed OT)     PT Frequency: 5x week OT Frequency: 5x week            Contractures Contractures Info: Not present    Additional Factors Info  Code Status, Allergies, Insulin Sliding Scale Code Status Info: full Allergies Info: Codeine, Tape   Insulin Sliding Scale Info: Novolog: see discharge summary       Current Medications (03/17/2023):  This is the current hospital active medication list Current Facility-Administered Medications  Medication Dose Route Frequency Provider Last Rate Last Admin   acetaminophen (TYLENOL) tablet 650 mg  650 mg Oral Q6H PRN West Bali, PA-C   650 mg at 03/17/23 1439   diltiazem (CARDIZEM CD) 24 hr capsule 180 mg  180 mg Oral Daily West Bali, PA-C   180 mg at 03/17/23 8469   docusate sodium (COLACE) capsule 100 mg  100 mg Oral BID West Bali, PA-C   100 mg at 03/17/23 0818   DULoxetine (CYMBALTA) DR capsule 60 mg  60 mg Oral Daily Thyra Breed A, PA-C   60 mg at 03/17/23 [phone]  enoxaparin (LOVENOX) injection 40 mg  40 mg Subcutaneous Q24H West Bali, PA-C   40 mg at 03/17/23 0734   gabapentin (NEURONTIN) capsule 100 mg  100 mg Oral QHS West Bali, PA-C   100 mg at 03/16/23 2142   guaiFENesin (ROBITUSSIN) 100 MG/5ML liquid 5 mL  5 mL Oral Q4H PRN Amin, Ankit Chirag, MD       hydrALAZINE (APRESOLINE) injection 10 mg  10 mg Intravenous Q4H PRN Amin, Ankit Chirag, MD       insulin aspart (novoLOG) injection 0-5 Units  0-5 Units Subcutaneous QHS West Bali, PA-C   2 Units  at 03/16/23 2141   insulin aspart (novoLOG) injection 0-9 Units  0-9 Units Subcutaneous TID WC West Bali, PA-C   1 Units at 03/17/23 1146   insulin aspart (novoLOG) injection 3 Units  3 Units Subcutaneous TID WC Harold Hedge, MD   3 Units at 03/17/23 1147   insulin glargine-yfgn (SEMGLEE) injection 6 Units  6 Units Subcutaneous BID Harold Hedge, MD   6 Units at 03/17/23 0859   ipratropium-albuterol (DUONEB) 0.5-2.5 (3) MG/3ML nebulizer solution 3 mL  3 mL Nebulization Q4H PRN Amin, Ankit Chirag, MD       menthol-cetylpyridinium (CEPACOL) lozenge 3 mg  1 lozenge Oral PRN Pudota, Elsie Ra, MD       methocarbamol (ROBAXIN) tablet 500 mg  500 mg Oral Q6H PRN West Bali, PA-C   500 mg at 03/15/23 1714   Or   methocarbamol (ROBAXIN) 500 mg in dextrose 5 % 50 mL IVPB  500 mg Intravenous Q6H PRN McClung, Sarah A, PA-C       metoCLOPramide (REGLAN) tablet 5-10 mg  5-10 mg Oral Q8H PRN Sharon Seller, Sarah A, PA-C       Or   metoCLOPramide (REGLAN) injection 5-10 mg  5-10 mg Intravenous Q8H PRN West Bali, PA-C       metoprolol succinate (TOPROL-XL) 24 hr tablet 100 mg  100 mg Oral Daily West Bali, PA-C   100 mg at 03/17/23 0818   mirabegron ER (MYRBETRIQ) tablet 50 mg  50 mg Oral Daily West Bali, PA-C   50 mg at 03/17/23 1610   morphine (PF) 2 MG/ML injection 2 mg  2 mg Intravenous Q2H PRN West Bali, PA-C   2 mg at 03/15/23 1714   ondansetron (ZOFRAN) tablet 4 mg  4 mg Oral Q6H PRN West Bali, PA-C       Or   ondansetron (ZOFRAN) injection 4 mg  4 mg Intravenous Q6H PRN McClung, Sarah A, PA-C       polyethylene glycol (MIRALAX / GLYCOLAX) packet 17 g  17 g Oral Daily PRN Sharon Seller, Sarah A, PA-C       pravastatin (PRAVACHOL) tablet 40 mg  40 mg Oral Daily West Bali, PA-C   40 mg at 03/16/23 1812   traMADol (ULTRAM) tablet 50-100 mg  50-100 mg Oral Q6H PRN West Bali, PA-C       traZODone (DESYREL) tablet 50 mg  50 mg Oral QHS PRN Amin,  Loura Halt, MD         Discharge Medications: Please see discharge summary for a list of discharge medications.  Relevant Imaging Results:  Relevant Lab Results:   Additional Information SSN: [id number]  Lorri Frederick, LCSW

## 2023-03-17 NOTE — Progress Notes (Signed)
PROGRESS NOTE    [name]  ZOX:[id number] DOB: 1951/09/30 DOA: 03/14/2023 PCP: Garlan Fillers, MD   Brief Narrative:  72 year old female history of chronic A-fib not on anticoagulation due to prior history of subarachnoid hemorrhage, chronic diastolic heart failure, hypertension, type 1 diabetes on chronic insulin, obesity, hyperlipidemia presents to the ER today after having a ground-level fall.  Patient was evaluated and determined to have right femur distal fracture periprosthetic and also a left trimalleolar fracture.  Patient evaluated by orthopedic surgery underwent open reduction internal fixation of both fractures 4/29.    Assessment & Plan:  Principal Problem:   Closed fracture of distal end of right femur [facility]) Active Problems:   Closed fibular fracture   Dyslipidemia   Essential hypertension   Type 1 diabetes mellitus on insulin therapy (HCC)   A-fib (HCC)   Chronic diastolic congestive heart failure (HCC)   Class 2 obesity   Closed displaced trimalleolar fracture of left ankle     Closed fracture of distal end of right femur (HCC) Closed fibular fracture/trimalleolar fracture Patient tolerated ORIF to right distal femur and left ankle well.  Hemoglobin stable.  Postop recommendations, DVT prophylaxis, weightbearing precautions, pain control and follow-up per orthopedic team.  Acute kidney injury - Baseline creatinine 0.7, this is trended up.  This morning 1.32.  Gentle hydration, will hold nephrotoxic drugs including Lasix, losartan and Aldactone today.  Gentle 500 cc normal saline bolus   Leukocytosis: Stable Reactive.  No signs of infection    Class 2 obesity Chronic.   Chronic diastolic congestive heart failure (HCC) Overall appears to be stable and euvolemic.  Continue home medications including Toprol-XL.  Nephrotoxic drugs on hold due to AKI   A-fib (HCC) On Cardizem, not on systemic anticoagulation due to history of subarachnoid hemorrhage   Type 1  diabetes mellitus on insulin therapy (HCC) On sliding scale and Accu-Chek.  NovoLog 3 units Premeal, Semglee 6 units twice daily   Essential hypertension Stable.   Dyslipidemia Pravachol  PT-CIR  DVT prophylaxis: Lovenox Code Status: Full code Family Communication:   Status is: Inpatient Will need placement to CIR       Diet Orders (From admission, onward)     Start     Ordered   03/15/23 1648  Diet Heart Room service appropriate? Yes; Fluid consistency: Thin  Diet effective now       Question Answer Comment  Room service appropriate? Yes   Fluid consistency: Thin      03/15/23 1647            Subjective: Doing okay no complaints   Examination:  General exam: Appears calm and comfortable  Respiratory system: Clear to auscultation. Respiratory effort normal. Cardiovascular system: S1 & S2 heard, RRR. No JVD, murmurs, rubs, gallops or clicks. No pedal edema. Gastrointestinal system: Abdomen is nondistended, soft and nontender. No organomegaly or masses felt. Normal bowel sounds heard. Central nervous system: Alert and oriented. No focal neurological deficits. Extremities: Symmetric 5 x 5 power. Skin: Surgical dressing noted to bilateral lower extremities Psychiatry: Judgement and insight appear normal. Mood & affect appropriate.  Objective: Vitals:   03/16/23 2100 03/17/23 0443 03/17/23 0500 03/17/23 0738  BP:  112/68  (!) 122/59  Pulse:  79    Resp:  18  18  Temp:  98 F (36.7 C)  (!) 97.5 F (36.4 C)  TempSrc:  Oral  Oral  SpO2: 98% 94%  95%  Weight:   93.7 kg   Height:  Intake/Output Summary (Last 24 hours) at 03/17/2023 0830 Last data filed at 03/17/2023 0715 Gross per 24 hour  Intake 240 ml  Output --  Net 240 ml   Filed Weights   03/14/23 1651 03/17/23 0500  Weight: 84.8 kg 93.7 kg    Scheduled Meds:  diltiazem  180 mg Oral Daily   docusate sodium  100 mg Oral BID   DULoxetine  60 mg Oral Daily   enoxaparin (LOVENOX) injection   40 mg Subcutaneous Q24H   furosemide  40 mg Oral Daily   gabapentin  100 mg Oral QHS   insulin aspart  0-5 Units Subcutaneous QHS   insulin aspart  0-9 Units Subcutaneous TID WC   insulin aspart  3 Units Subcutaneous TID WC   insulin glargine-yfgn  6 Units Subcutaneous BID   losartan  25 mg Oral Daily   metoprolol succinate  100 mg Oral Daily   mirabegron ER  50 mg Oral Daily   pravastatin  40 mg Oral Daily   spironolactone  12.5 mg Oral Daily   Continuous Infusions:  dextrose 5% lactated ringers Stopped (03/15/23 1125)   methocarbamol (ROBAXIN) IV      Nutritional status     Body mass index is 32.35 kg/m.  Data Reviewed:   CBC: Recent Labs  Lab 03/14/23 1900 03/15/23 0233 03/16/23 0452 03/17/23 0154  WBC 15.4* 16.7* 11.9* 12.2*  NEUTROABS 13.1*  --   --  8.2*  HGB 15.9* 14.1 12.0 11.2*  HCT 50.2* 43.2 37.3 36.0  MCV 92.8 91.1 91.0 92.5  PLT 142* 147* 147* 163   Basic Metabolic Panel: Recent Labs  Lab 03/14/23 1900 03/15/23 0233 03/16/23 0452 03/17/23 0154  NA 136  --  131* 133*  K 5.2*  --  4.9 5.0  CL 96*  --  99 97*  CO2 27  --  25 27  GLUCOSE 184*  --  477* 122*  BUN 23  --  37* 45*  CREATININE 0.78 0.96 1.27* 1.32*  CALCIUM 10.0  --  8.7* 9.2   GFR: Estimated Creatinine Clearance: 43.9 mL/min (A) (by C-G formula based on SCr of 1.32 mg/dL (H)). Liver Function Tests: Recent Labs  Lab 03/14/23 1900 03/17/23 0154  AST 27 24  ALT 26 10  ALKPHOS 100 86  BILITOT 1.2 0.3  PROT 6.8 5.9*  ALBUMIN 3.7 3.0*   No results for input(s): "LIPASE", "AMYLASE" in the last 168 hours. No results for input(s): "AMMONIA" in the last 168 hours. Coagulation Profile: No results for input(s): "INR", "PROTIME" in the last 168 hours. Cardiac Enzymes: No results for input(s): "CKTOTAL", "CKMB", "CKMBINDEX", "TROPONINI" in the last 168 hours. BNP (last 3 results) No results for input(s): "PROBNP" in the last 8760 hours. HbA1C: No results for input(s): "HGBA1C" in  the last 72 hours. CBG: Recent Labs  Lab 03/16/23 1108 03/16/23 1141 03/16/23 1608 03/16/23 2137 03/17/23 0727  GLUCAP 495* 466* 352* 210* 171*   Lipid Profile: No results for input(s): "CHOL", "HDL", "LDLCALC", "TRIG", "CHOLHDL", "LDLDIRECT" in the last 72 hours. Thyroid Function Tests: No results for input(s): "TSH", "T4TOTAL", "FREET4", "T3FREE", "THYROIDAB" in the last 72 hours. Anemia Panel: No results for input(s): "VITAMINB12", "FOLATE", "FERRITIN", "TIBC", "IRON", "RETICCTPCT" in the last 72 hours. Sepsis Labs: No results for input(s): "PROCALCITON", "LATICACIDVEN" in the last 168 hours.  Recent Results (from the past 240 hour(s))  Surgical pcr screen     Status: None   Collection Time: 03/15/23  9:01 AM   Specimen:  Nasal Mucosa; Nasal Swab  Result Value Ref Range Status   MRSA, PCR NEGATIVE NEGATIVE Final   Staphylococcus aureus NEGATIVE NEGATIVE Final    Comment: Performed at [facility], [address] [place], [place] [address]         Radiology Studies: DG Ankle Complete Left  Result Date: 03/15/2023 CLINICAL DATA:  Fracture EXAM: LEFT ANKLE COMPLETE - 3+ VIEW COMPARISON:  Left ankle x-ray 03/15/2023 FINDINGS: There is a new lateral fibular sideplate and screws fixating a distal fibular fracture. There are 2 new screws fixating medial malleolar fracture. Alignment is anatomic. Bones are osteopenic. There is soft tissue swelling surrounding the ankle. IMPRESSION: Status post ORIF of distal fibular and medial malleolar fractures. Alignment is anatomic. Electronically Signed   By: Darliss Cheney M.D.   On: 03/15/2023 16:53   DG Knee Right Port  Result Date: 03/15/2023 CLINICAL DATA:  Postop fracture EXAM: PORTABLE RIGHT KNEE - 2 VIEW COMPARISON:  Knee CT dated March 14, 2023 FINDINGS: Interval postsurgical changes from internal fixation periprosthetic fracture of the distal right femur. Hardware components appear in their expected alignment. Prior right total  knee replacement. Expected postoperative changes within the overlying soft tissues. IMPRESSION: Interval postsurgical changes from internal fixation of periprosthetic fracture of the distal right femur. Electronically Signed   By: Allegra Lai M.D.   On: 03/15/2023 16:34   DG Ankle Complete Left  Result Date: 03/15/2023 CLINICAL DATA:  [id number] Elective surgery [id number] EXAM: LEFT ANKLE COMPLETE - 3+ VIEW COMPARISON:  Radiograph 03/15/2023 FINDINGS: Intraoperative images during distal fibular plate fixation, syndesmotic fixation, and medial malleolar fixation. Improved fracture alignment. IMPRESSION: Intraoperative images during ankle fracture ORIF with syndesmotic fixation. Improved fracture alignment. Electronically Signed   By: Caprice Renshaw M.D.   On: 03/15/2023 15:21   DG FEMUR, MIN 2 VIEWS RIGHT  Result Date: 03/15/2023 CLINICAL DATA:  [id number] Elective surgery [id number] EXAM: RIGHT FEMUR 2 VIEWS COMPARISON:  CT 03/14/2023 FINDINGS: Intraoperative images during plate fixation of the femur for a distal femur periprosthetic fracture. IMPRESSION: Intraoperative images during distal femur ORIF. Electronically Signed   By: Caprice Renshaw M.D.   On: 03/15/2023 15:19   DG C-Arm 1-60 Min-No Report  Result Date: 03/15/2023 Fluoroscopy was utilized by the requesting physician.  No radiographic interpretation.   DG C-Arm 1-60 Min-No Report  Result Date: 03/15/2023 Fluoroscopy was utilized by the requesting physician.  No radiographic interpretation.           LOS: 3 days   Time spent= 35 mins    Zella Dewan Joline Maxcy, MD [facility]  If 7PM-7AM, please contact night-coverage  03/17/2023, 8:30 AM

## 2023-03-17 NOTE — Progress Notes (Signed)
Physical Therapy Treatment Patient Details Name: [name] MRN: [id number] DOB: 25-Sep-1951 Today's Date: 03/17/2023   History of Present Illness 72 year old female admitted 4/28 for fall, now s/p ORIF right periprosthetic distal femur fracture, ORIF left trimalleolar ankle fracture on 4/29. PMHx: of chronic A-fib not on anticoagulation due to prior history of subarachnoid hemorrhage, chronic diastolic heart failure, hypertension, type 1 diabetes on chronic insulin, obesity, hyperlipidemia.    PT Comments    Pt was seen for mobility on side of bed with difficulty getting up to attempt standing.  Pt is however able to move on side of bed and worked on sitting push ups with some lift engaged to begin the process of standing.  Her plan is to continue to use devices such as stedy to stand, although her pain is an issue.  Provided pt with ice, nursing in to assist meds.  Follow along with her to work on goals of PT with active use of RLE and to manage pain to facilitate this.  Pt is expecting to go to rehab, has been declined for [facility] and will hopefully find a suitable other option.  Recommendations for follow up therapy are one component of a multi-disciplinary discharge planning process, led by the attending physician.  Recommendations may be updated based on patient status, additional functional criteria and insurance authorization.  Follow Up Recommendations  Can patient physically be transported by private vehicle: No    Assistance Recommended at Discharge Frequent or constant Supervision/Assistance  Patient can return home with the following Two people to help with walking and/or transfers;Two people to help with bathing/dressing/bathroom;Assistance with cooking/housework;Assist for transportation;Help with stairs or ramp for entrance   Equipment Recommendations  None recommended by PT    Recommendations for Other Services Rehab consult     Precautions / Restrictions  Precautions Precautions: Fall Precaution Comments: RLE pain with all movement Restrictions Weight Bearing Restrictions: Yes RLE Weight Bearing: Weight bearing as tolerated LLE Weight Bearing: Non weight bearing     Mobility  Bed Mobility Overal bed mobility: Needs Assistance Bed Mobility: Supine to Sit, Sit to Supine     Supine to sit: Mod assist Sit to supine: Max assist, +2 for physical assistance, +2 for safety/equipment   General bed mobility comments: Requires more help to return to bed as she is tired from the effort to stand and scoot, and cannot generate as much to direct her trunk back to bed    Transfers Overall transfer level: Needs assistance Equipment used: Rolling walker (2 wheels) Transfers: Sit to/from Stand Sit to Stand: Total assist, +2 safety/equipment, +2 physical assistance, From elevated surface           General transfer comment: Unable to get to standing    Ambulation/Gait               General Gait Details: unable   Stairs             Wheelchair Mobility    Modified Rankin (Stroke Patients Only)       Balance Overall balance assessment: Needs assistance, History of Falls Sitting-balance support: Bilateral upper extremity supported, Single extremity supported Sitting balance-Leahy Scale: Poor Sitting balance - Comments: requires support on BUE's for bedside balance                                    Cognition Arousal/Alertness: Awake/alert Behavior During Therapy: WFL for tasks assessed/performed Overall Cognitive  Status: Within Functional Limits for tasks assessed                                          Exercises General Exercises - Lower Extremity Long Arc Quad: AROM, AAROM, Both, 10 reps Heel Slides: AAROM, 10 reps, AROM Hip ABduction/ADduction: AROM, AAROM, 10 reps Hip Flexion/Marching: AAROM, AROM, 10 reps    General Comments General comments (skin integrity, edema, etc.):  Pt was seen for progression of sitting balance and eventually got more control of sitting to require only min assist to balance.  Unable to stand and partially scooting on BUE's to do a sitting push up      Pertinent Vitals/Pain Pain Assessment Pain Assessment: Faces Faces Pain Scale: Hurts even more Pain Location: R/L leg/feet Pain Descriptors / Indicators: Aching, Discomfort Pain Intervention(s): Limited activity within patient's tolerance, Monitored during session, Repositioned, Premedicated before session    Home Living                          Prior Function            PT Goals (current goals can now be found in the care plan section) Acute Rehab PT Goals Patient Stated Goal: Get well at rehab before going home    Frequency    Min 4X/week      PT Plan Current plan remains appropriate    Co-evaluation              AM-PAC PT "6 Clicks" Mobility   Outcome Measure  Help needed turning from your back to your side while in a flat bed without using bedrails?: A Little Help needed moving from lying on your back to sitting on the side of a flat bed without using bedrails?: A Lot Help needed moving to and from a bed to a chair (including a wheelchair)?: Total Help needed standing up from a chair using your arms (e.g., wheelchair or bedside chair)?: Total Help needed to walk in hospital room?: Total Help needed climbing 3-5 steps with a railing? : Total 6 Click Score: 9    End of Session   Activity Tolerance: Patient limited by fatigue;Patient limited by pain;Treatment limited secondary to medical complications (Comment) Patient left: in bed;with call bell/phone within reach;with bed alarm set;with family/visitor present Nurse Communication: Mobility status;Need for lift equipment PT Visit Diagnosis: Muscle weakness (generalized) (M62.81);History of falling (Z91.81);Difficulty in walking, not elsewhere classified (R26.2);Pain Pain - Right/Left: Right Pain  - part of body: Hip;Knee     Time: 1410-1450 PT Time Calculation (min) (ACUTE ONLY): 40 min  Charges:  $Therapeutic Exercise: 8-22 mins $Therapeutic Activity: 23-37 mins               Ivar Drape 03/17/2023, 3:29 PM  Samul Dada, PT PhD Acute Rehab Dept. Number: [facility] [id number] and [facility] [phone]

## 2023-03-17 NOTE — Care Management Important Message (Signed)
Important Message  Patient Details  Name: [name] MRN: [id number] Date of Birth: July 17, 1951   Medicare Important Message Given:  Yes     Sherilyn Banker 03/17/2023, 1:14 PM

## 2023-03-17 NOTE — Plan of Care (Signed)
  Problem: Nutritional: Goal: Maintenance of adequate nutrition will improve Outcome: Progressing   Problem: Tissue Perfusion: Goal: Adequacy of tissue perfusion will improve Outcome: Progressing   Problem: Activity: Goal: Risk for activity intolerance will decrease Outcome: Not Progressing

## 2023-03-18 DIAGNOSIS — S72401A Unspecified fracture of lower end of right femur, initial encounter for closed fracture: Secondary | ICD-10-CM

## 2023-03-18 LAB — GLUCOSE, CAPILLARY
Glucose-Capillary: 123 mg/dL — ABNORMAL HIGH (ref 70–99)
Glucose-Capillary: 177 mg/dL — ABNORMAL HIGH (ref 70–99)
Glucose-Capillary: 201 mg/dL — ABNORMAL HIGH (ref 70–99)
Glucose-Capillary: 182 mg/dL — ABNORMAL HIGH (ref 70–99)
Glucose-Capillary: 192 mg/dL — ABNORMAL HIGH (ref 70–99)

## 2023-03-18 LAB — BASIC METABOLIC PANEL
Anion gap: 7 (ref 5–15)
BUN: 27 mg/dL — ABNORMAL HIGH (ref 8–23)
CO2: 26 mmol/L (ref 22–32)
Calcium: 8.7 mg/dL — ABNORMAL LOW (ref 8.9–10.3)
Chloride: 104 mmol/L (ref 98–111)
Creatinine, Ser: 0.77 mg/dL (ref 0.44–1.00)
GFR, Estimated: >60 mL/min (ref >60)
Glucose, Bld: 133 mg/dL — ABNORMAL HIGH (ref 70–99)
Potassium: 3.6 mmol/L (ref 3.5–5.1)
Sodium: 137 mmol/L (ref 135–145)

## 2023-03-18 LAB — CBC
HCT: 37.2 % (ref 36.0–46.0)
Hemoglobin: 11.7 g/dL — ABNORMAL LOW (ref 12.0–15.0)
MCH: 28.9 pg (ref 26.0–34.0)
MCHC: 31.5 g/dL (ref 30.0–36.0)
MCV: 91.9 fL (ref 80.0–100.0)
Platelets: 148 10*3/uL — ABNORMAL LOW (ref 150–400)
RBC: 4.05 MIL/uL (ref 3.87–5.11)
RDW: 16.2 % — ABNORMAL HIGH (ref 11.5–15.5)
WBC: 8.3 10*3/uL (ref 4.0–10.5)
nRBC: 0 % (ref 0.0–0.2)

## 2023-03-18 LAB — MAGNESIUM: Magnesium: 1.9 mg/dL (ref 1.7–2.4)

## 2023-03-18 MED ORDER — TRAMADOL HCL 50 MG PO TABS: 50.0000 mg | ORAL_TABLET | Freq: Four times a day (QID) | ORAL | 0 refills | Status: DC | PRN

## 2023-03-18 MED ORDER — METHOCARBAMOL 500 MG PO TABS: 500.0000 mg | ORAL_TABLET | Freq: Four times a day (QID) | ORAL | 0 refills | Status: DC | PRN

## 2023-03-18 MED ORDER — APIXABAN 2.5 MG PO TABS: 2.5000 mg | ORAL_TABLET | Freq: Two times a day (BID) | ORAL | 0 refills | Status: DC

## 2023-03-18 NOTE — TOC Progression Note (Addendum)
Transition of Care [facility]) - Progression Note    Patient Details  Name: [name] MRN: [id number] Date of Birth: May 13, 1951  Transition of Care [facility]) CM/SW Contact  Lorri Frederick, LCSW Phone Number: 03/18/2023, 11:27 AM  Clinical Narrative:    CSW LM with [facility].  1120-second call to [facility], no response yet.   Bed offers provided to daughter Victorino Dike.  CSW reached out to [facility] as well.  Updated on [facility].  She will review offers with other family.  1145: [facility] does offer bed, daughter informed.   Several additional phone calls with daughter, they are touring and working on decision.  1500: TC [facility].  They need additional info to make decision.  Only received therapy notes.  Expected Discharge Plan: IP Rehab Facility Barriers to Discharge: Continued Medical Work up  Expected Discharge Plan and Services   Discharge Planning Services: CM Consult   Living arrangements for the past 2 months: Single Family Home                                       Social Determinants of Health (SDOH) Interventions SDOH Screenings   Food Insecurity: No Food Insecurity (12/01/2022)  Housing: Low Risk  (12/01/2022)  Transportation Needs: No Transportation Needs (12/01/2022)  Utilities: Not At Risk (12/01/2022)  Tobacco Use: Low Risk  (03/16/2023)    Readmission Risk Interventions    12/04/2022   12:42 PM  Readmission Risk Prevention Plan  Transportation Screening Complete  PCP or Specialist Appt within 5-7 Days Complete  Home Care Screening Complete  Medication Review (RN CM) Complete

## 2023-03-18 NOTE — Progress Notes (Signed)
PROGRESS NOTE    [name]  ZOX:[id number] DOB: 1951/06/23 DOA: 03/14/2023 PCP: Garlan Fillers, MD   Brief Narrative:  72 year old female history of chronic A-fib not on anticoagulation due to prior history of subarachnoid hemorrhage, chronic diastolic heart failure, hypertension, type 1 diabetes on chronic insulin, obesity, hyperlipidemia presents to the ER today after having a ground-level fall.  Patient was evaluated and determined to have right femur distal fracture periprosthetic and also a left trimalleolar fracture.  Patient evaluated by orthopedic surgery underwent open reduction internal fixation of both fractures 4/29.   03/18/2023: Patient seen alongside patient's husband.  No new complaints.  Awaiting disposition.  The plan is to discharge patient to a rehab facility.   Assessment & Plan:  Principal Problem:   Closed fracture of distal end of right femur (HCC) Active Problems:   Dyslipidemia   Essential hypertension   Type 1 diabetes mellitus on insulin therapy (HCC)   A-fib (HCC)   Chronic diastolic congestive heart failure (HCC)   Class 2 obesity   Closed fibular fracture   Closed displaced trimalleolar fracture of left ankle     Closed fracture of distal end of right femur (HCC) Closed fibular fracture/trimalleolar fracture Patient tolerated ORIF to right distal femur and left ankle well.  Hemoglobin stable.  Postop recommendations, DVT prophylaxis, weightbearing precautions, pain control and follow-up per orthopedic team. 03/18/2023: Pursue disposition.  Acute kidney injury - Baseline creatinine 0.7, this is trended up.  This morning 1.32.  Gentle hydration, will hold nephrotoxic drugs including Lasix, losartan and Aldactone today.  Gentle 500 cc normal saline bolus 03/18/2023: AKI has resolved.   Leukocytosis: Stable Reactive.  No signs of infection 03/18/2023: Leukocytosis is resolved    Class 2 obesity Chronic.   Chronic diastolic congestive heart failure  (HCC) Overall appears to be stable and euvolemic.  Continue home medications including Toprol-XL.  Nephrotoxic drugs on hold due to AKI 03/18/2023: Compensated.  A-fib (HCC) On Cardizem, not on systemic anticoagulation due to history of subarachnoid hemorrhage   Type 1 diabetes mellitus on insulin therapy (HCC) On sliding scale and Accu-Chek.  NovoLog 3 units Premeal, Semglee 6 units twice daily   Essential hypertension Stable.   Dyslipidemia Pravachol  PT-CIR  DVT prophylaxis: Lovenox Code Status: Full code Family Communication:   Status is: Inpatient Will need placement to CIR       Diet Orders (From admission, onward)     Start     Ordered   03/15/23 1648  Diet Heart Room service appropriate? Yes; Fluid consistency: Thin  Diet effective now       Question Answer Comment  Room service appropriate? Yes   Fluid consistency: Thin      03/15/23 1647            Subjective: Patient seen alongside patient's husband. No chest pain. No shortness of breath No fever or chills.   Examination:  General exam: Appears calm and comfortable  Respiratory system: Clear to auscultation.  Cardiovascular system: S1 & S2 heard. Gastrointestinal system: Abdomen is obese, soft and nontender.   Central nervous system: Alert and oriented.  Patient moves all extremities. Extremities: Left lower leg is wrapped and ?splinted..  Objective: Vitals:   03/18/23 0500 03/18/23 0515 03/18/23 0737 03/18/23 1519  BP:  (!) 146/82 139/80 130/60  Pulse:  88 98 86  Resp:  17 18 18   Temp:  98 F (36.7 C) 98.6 F (37 C) 98.7 F (37.1 C)  TempSrc:  Oral Oral  Oral  SpO2:  100% 94% 96%  Weight: 97.2 kg     Height:        Intake/Output Summary (Last 24 hours) at 03/18/2023 1637 Last data filed at 03/18/2023 1300 Gross per 24 hour  Intake 480 ml  Output --  Net 480 ml    Filed Weights   03/14/23 1651 03/17/23 0500 03/18/23 0500  Weight: 84.8 kg 93.7 kg 97.2 kg    Scheduled Meds:   diltiazem  180 mg Oral Daily   docusate sodium  100 mg Oral BID   DULoxetine  60 mg Oral Daily   enoxaparin (LOVENOX) injection  40 mg Subcutaneous Q24H   gabapentin  100 mg Oral QHS   insulin aspart  0-5 Units Subcutaneous QHS   insulin aspart  0-9 Units Subcutaneous TID WC   insulin aspart  3 Units Subcutaneous TID WC   insulin glargine-yfgn  6 Units Subcutaneous BID   metoprolol succinate  100 mg Oral Daily   mirabegron ER  50 mg Oral Daily   pravastatin  40 mg Oral Daily   Continuous Infusions:  methocarbamol (ROBAXIN) IV      Nutritional status     Body mass index is 33.56 kg/m.  Data Reviewed:   CBC: Recent Labs  Lab 03/14/23 1900 03/15/23 0233 03/16/23 0452 03/17/23 0154 03/18/23 0448  WBC 15.4* 16.7* 11.9* 12.2* 8.3  NEUTROABS 13.1*  --   --  8.2*  --   HGB 15.9* 14.1 12.0 11.2* 11.7*  HCT 50.2* 43.2 37.3 36.0 37.2  MCV 92.8 91.1 91.0 92.5 91.9  PLT 142* 147* 147* 163 148*    Basic Metabolic Panel: Recent Labs  Lab 03/14/23 1900 03/15/23 0233 03/16/23 0452 03/17/23 0154 03/18/23 0448  NA 136  --  131* 133* 137  K 5.2*  --  4.9 5.0 3.6  CL 96*  --  99 97* 104  CO2 27  --  25 27 26   GLUCOSE 184*  --  477* 122* 133*  BUN 23  --  37* 45* 27*  CREATININE 0.78 0.96 1.27* 1.32* 0.77  CALCIUM 10.0  --  8.7* 9.2 8.7*  MG  --   --   --   --  1.9    GFR: Estimated Creatinine Clearance: 73.8 mL/min (by C-G formula based on SCr of 0.77 mg/dL). Liver Function Tests: Recent Labs  Lab 03/14/23 1900 03/17/23 0154  AST 27 24  ALT 26 10  ALKPHOS 100 86  BILITOT 1.2 0.3  PROT 6.8 5.9*  ALBUMIN 3.7 3.0*    No results for input(s): "LIPASE", "AMYLASE" in the last 168 hours. No results for input(s): "AMMONIA" in the last 168 hours. Coagulation Profile: No results for input(s): "INR", "PROTIME" in the last 168 hours. Cardiac Enzymes: No results for input(s): "CKTOTAL", "CKMB", "CKMBINDEX", "TROPONINI" in the last 168 hours. BNP (last 3 results) No  results for input(s): "PROBNP" in the last 8760 hours. HbA1C: No results for input(s): "HGBA1C" in the last 72 hours. CBG: Recent Labs  Lab 03/17/23 1728 03/17/23 2021 03/18/23 0734 03/18/23 1156 03/18/23 1630  GLUCAP 112* 139* 123* 177* 201*    Lipid Profile: No results for input(s): "CHOL", "HDL", "LDLCALC", "TRIG", "CHOLHDL", "LDLDIRECT" in the last 72 hours. Thyroid Function Tests: No results for input(s): "TSH", "T4TOTAL", "FREET4", "T3FREE", "THYROIDAB" in the last 72 hours. Anemia Panel: No results for input(s): "VITAMINB12", "FOLATE", "FERRITIN", "TIBC", "IRON", "RETICCTPCT" in the last 72 hours. Sepsis Labs: No results for input(s): "PROCALCITON", "LATICACIDVEN" in the  last 168 hours.  Recent Results (from the past 240 hour(s))  Surgical pcr screen     Status: None   Collection Time: 03/15/23  9:01 AM   Specimen: Nasal Mucosa; Nasal Swab  Result Value Ref Range Status   MRSA, PCR NEGATIVE NEGATIVE Final   Staphylococcus aureus NEGATIVE NEGATIVE Final    Comment: Performed at [facility], [address] [place], [place] [address]         Radiology Studies: No results found.         LOS: 4 days   Time spent= 35 mins    Barnetta Chapel, MD [facility]  If 7PM-7AM, please contact night-coverage  03/18/2023, 4:37 PM

## 2023-03-18 NOTE — Progress Notes (Signed)
Occupational Therapy Treatment Patient Details Name: Shaunie Boehm MRN: [id number] DOB: 1950/12/02 Today's Date: 03/18/2023   History of present illness 72 year old female admitted 4/28 for fall, now s/p ORIF right periprosthetic distal femur fracture, ORIF left trimalleolar ankle fracture on 4/29. PMHx: of chronic A-fib not on anticoagulation due to prior history of subarachnoid hemorrhage, chronic diastolic heart failure, hypertension, type 1 diabetes on chronic insulin, obesity, hyperlipidemia.   OT comments  Pt motivated to participate today, husband was present for session. Pt able to perform don/doff RLE socks with use of sock aide today, unable to perform dressing with lateral leans, unable to stand with use of Stedy. Pt RLE is unable to bend at knee enough and is too weak to perform standing even with assist and Stedy, will need to use hoyer. Pt very motivated to improve function. Pt does have bed offers for IP rehab, would greatly benefit from continued skilled OT to improve in strength and instruct on compensatory strategies for ADLs.    Recommendations for follow up therapy are one component of a multi-disciplinary discharge planning process, led by the attending physician.  Recommendations may be updated based on patient status, additional functional criteria and insurance authorization.    Assistance Recommended at Discharge Frequent or constant Supervision/Assistance  Patient can return home with the following  Two people to help with walking and/or transfers;Two people to help with bathing/dressing/bathroom;Assistance with cooking/housework;Assist for transportation;Help with stairs or ramp for entrance   Equipment Recommendations  None recommended by OT    Recommendations for Other Services      Precautions / Restrictions Precautions Precautions: Fall Precaution Comments: RLE pain with all movement Restrictions Weight Bearing Restrictions: Yes RLE Weight Bearing: Weight  bearing as tolerated LLE Weight Bearing: Non weight bearing       Mobility Bed Mobility Overal bed mobility: Needs Assistance Bed Mobility: Supine to Sit, Sit to Supine     Supine to sit: Mod assist Sit to supine: Max assist, +2 for physical assistance   General bed mobility comments: increased assistance from EOB to supine, unable to scoot back in bed, needs assistance with BLEs    Transfers Overall transfer level: Needs assistance                 General transfer comment: unable to stand using stedy, will need hoyer lift     Balance Overall balance assessment: Needs assistance, History of Falls Sitting-balance support: No upper extremity supported, Feet supported Sitting balance-Leahy Scale: Fair Sitting balance - Comments: able to perofrm ADLs at EOB     Standing balance-Leahy Scale: Zero                             ADL either performed or assessed with clinical judgement   ADL Overall ADL's : Needs assistance/impaired Eating/Feeding: Independent   Grooming: Set up;Sitting           Upper Body Dressing : Set up   Lower Body Dressing: Maximal assistance;With adaptive equipment;Sitting/lateral leans                 General ADL Comments:  (Pt instructed on use of sock aide today at EOB. Pt unable to stand due to LLE NWB, and decreased strength in RLE (WBAT). dependent for transfers)    Extremity/Trunk Assessment              Vision       Perception     Praxis  Cognition Arousal/Alertness: Awake/alert Behavior During Therapy: WFL for tasks assessed/performed Overall Cognitive Status: Within Functional Limits for tasks assessed                                          Exercises      Shoulder Instructions       General Comments      Pertinent Vitals/ Pain       Pain Assessment Pain Assessment: 0-10 Pain Score: 6  Faces Pain Scale: Hurts even more Pain Location: R/L leg/feet Pain  Descriptors / Indicators: Aching, Discomfort Pain Intervention(s): Monitored during session  Home Living                                          Prior Functioning/Environment              Frequency  Min 2X/week        Progress Toward Goals  OT Goals(current goals can now be found in the care plan section)  Progress towards OT goals: Progressing toward goals  Acute Rehab OT Goals Patient Stated Goal: to improve RLE strength to stand OT Goal Formulation: With patient Time For Goal Achievement: 03/26/23 Potential to Achieve Goals: Good ADL Goals Pt Will Perform Lower Body Bathing: with min assist;with adaptive equipment Pt Will Perform Lower Body Dressing: with mod assist;sitting/lateral leans Pt Will Transfer to Toilet: with max assist Pt Will Perform Toileting - Clothing Manipulation and hygiene: with mod assist;sitting/lateral leans  Plan Discharge plan remains appropriate    Co-evaluation                 AM-PAC OT "6 Clicks" Daily Activity     Outcome Measure   Help from another person eating meals?: None Help from another person taking care of personal grooming?: A Little Help from another person toileting, which includes using toliet, bedpan, or urinal?: A Lot Help from another person bathing (including washing, rinsing, drying)?: A Lot Help from another person to put on and taking off regular upper body clothing?: A Little Help from another person to put on and taking off regular lower body clothing?: A Lot 6 Click Score: 16    End of Session Equipment Utilized During Treatment: Gait belt;Rolling walker (2 wheels) (Stedy)  OT Visit Diagnosis: Unsteadiness on feet (R26.81);Other abnormalities of gait and mobility (R26.89);Repeated falls (R29.6);Muscle weakness (generalized) (M62.81);Pain Pain - Right/Left: Left Pain - part of body: Leg   Activity Tolerance Patient tolerated treatment well   Patient Left in bed;with call  bell/phone within reach;with bed alarm set   Nurse Communication Mobility status        Time: 1610-9604 OT Time Calculation (min): 34 min  Charges: OT General Charges $OT Visit: 1 Visit OT Treatments $Self Care/Home Management : 8-22 mins $Therapeutic Activity: 8-22 mins  Elester Apodaca, OTR/L   Ethne Jeon R Robert Sunga 03/18/2023, 3:20 PM

## 2023-03-19 DIAGNOSIS — S72401A Unspecified fracture of lower end of right femur, initial encounter for closed fracture: Secondary | ICD-10-CM | POA: Diagnosis not present

## 2023-03-19 LAB — GLUCOSE, CAPILLARY
Glucose-Capillary: 185 mg/dL — ABNORMAL HIGH (ref 70–99)
Glucose-Capillary: 221 mg/dL — ABNORMAL HIGH (ref 70–99)
Glucose-Capillary: 168 mg/dL — ABNORMAL HIGH (ref 70–99)
Glucose-Capillary: 143 mg/dL — ABNORMAL HIGH (ref 70–99)

## 2023-03-19 NOTE — TOC Progression Note (Signed)
Transition of Care [facility]) - Progression Note    Patient Details  Name: [name] MRN: [id number] Date of Birth: 1951-07-11  Transition of [facility]) CM/SW Contact  Lorri Frederick, LCSW Phone Number: 03/19/2023, 9:53 AM  Clinical Narrative:   TC daughter Victorino Dike.  They would like to accept offer at [facility].  CSW message with [facility].  They will not have available bed until Monday.  SNF auth request made for Monday.    Expected Discharge Plan: IP Rehab Facility Barriers to Discharge: Continued Medical Work up  Expected Discharge Plan and Services   Discharge Planning Services: CM Consult   Living arrangements for the past 2 months: Single Family Home                                       Social Determinants of Health (SDOH) Interventions SDOH Screenings   Food Insecurity: No Food Insecurity (12/01/2022)  Housing: Low Risk  (12/01/2022)  Transportation Needs: No Transportation Needs (12/01/2022)  Utilities: Not At Risk (12/01/2022)  Tobacco Use: Low Risk  (03/16/2023)    Readmission Risk Interventions    12/04/2022   12:42 PM  Readmission Risk Prevention Plan  Transportation Screening Complete  PCP or Specialist Appt within 5-7 Days Complete  Home Care Screening Complete  Medication Review (RN CM) Complete

## 2023-03-19 NOTE — Progress Notes (Signed)
PROGRESS NOTE    [name]  UJW:[id number] DOB: [birth year] DOA: 03/14/2023 PCP: Garlan Fillers, MD   Brief Narrative:  72 year old female history of chronic A-fib not on anticoagulation due to prior history of subarachnoid hemorrhage, chronic diastolic heart failure, hypertension, type 1 diabetes on chronic insulin, obesity, hyperlipidemia presents to the ER today after having a ground-level fall.  Patient was evaluated and determined to have right femur distal fracture periprosthetic and also a left trimalleolar fracture.  Patient evaluated by orthopedic surgery underwent open reduction internal fixation of both fractures 4/29.   03/19/2023: Patient seen alongside patient's husband.  No new complaints.  Awaiting disposition.  The plan is to discharge patient to a rehab facility.   Assessment & Plan:  Principal Problem:   Closed fracture of distal end of right femur (HCC) Active Problems:   Dyslipidemia   Essential hypertension   Type 1 diabetes mellitus on insulin therapy (HCC)   A-fib (HCC)   Chronic diastolic congestive heart failure (HCC)   Class 2 obesity   Closed fibular fracture   Closed displaced trimalleolar fracture of left ankle     Closed fracture of distal end of right femur (HCC) Closed fibular fracture/trimalleolar fracture Patient tolerated ORIF to right distal femur and left ankle well.  Hemoglobin stable.  Postop recommendations, DVT prophylaxis, weightbearing precautions, pain control and follow-up per orthopedic team. 03/19/2023: Pursue disposition.  Acute kidney injury - Baseline creatinine 0.7, this is trended up.  This morning 1.32.  Gentle hydration, will hold nephrotoxic drugs including Lasix, losartan and Aldactone today.  Gentle 500 cc normal saline bolus 03/19/2023: AKI has resolved.   Leukocytosis: Stable Reactive.  No signs of infection 03/19/2023: Leukocytosis is resolved    Class 2 obesity Chronic.   Chronic diastolic congestive heart failure  (HCC) Overall appears to be stable and euvolemic.  Continue home medications including Toprol-XL.  Nephrotoxic drugs on hold due to AKI 03/18/2023: Compensated.  A-fib (HCC) On Cardizem, not on systemic anticoagulation due to history of subarachnoid hemorrhage   Type 1 diabetes mellitus on insulin therapy (HCC) On sliding scale and Accu-Chek.  NovoLog 3 units Premeal, Semglee 6 units twice daily   Essential hypertension Stable.   Dyslipidemia Pravachol  PT-CIR  DVT prophylaxis: Lovenox Code Status: Full code Family Communication:   Status is: Inpatient Will need placement to CIR       Diet Orders (From admission, onward)     Start     Ordered   03/15/23 1648  Diet Heart Room service appropriate? Yes; Fluid consistency: Thin  Diet effective now       Question Answer Comment  Room service appropriate? Yes   Fluid consistency: Thin      03/15/23 1647            Subjective: Patient seen alongside patient's husband. No chest pain. No shortness of breath No fever or chills.   Examination:  General exam: Appears calm and comfortable  Respiratory system: Clear to auscultation.  Cardiovascular system: S1 & S2 heard. Gastrointestinal system: Abdomen is obese, soft and nontender.   Central nervous system: Alert and oriented.  Patient moves all extremities. Extremities: Left lower leg is wrapped and ?splinted..  Objective: Vitals:   03/18/23 1939 03/19/23 0600 03/19/23 0739 03/19/23 1326  BP: (!) 147/85  (!) 152/85 132/70  Pulse: 98  93 90  Resp: 18  18 20   Temp: 98.5 F (36.9 C)  98.5 F (36.9 C) 98.2 F (36.8 C)  TempSrc: Oral  Oral Oral  SpO2: 100%  99% 96%  Weight:  84.8 kg    Height:        Intake/Output Summary (Last 24 hours) at 03/19/2023 1415 Last data filed at 03/19/2023 1300 Gross per 24 hour  Intake 720 ml  Output 1100 ml  Net -380 ml    Filed Weights   03/17/23 0500 03/18/23 0500 03/19/23 0600  Weight: 93.7 kg 97.2 kg 84.8 kg     Scheduled Meds:  diltiazem  180 mg Oral Daily   docusate sodium  100 mg Oral BID   DULoxetine  60 mg Oral Daily   enoxaparin (LOVENOX) injection  40 mg Subcutaneous Q24H   gabapentin  100 mg Oral QHS   insulin aspart  0-5 Units Subcutaneous QHS   insulin aspart  0-9 Units Subcutaneous TID WC   insulin aspart  3 Units Subcutaneous TID WC   insulin glargine-yfgn  6 Units Subcutaneous BID   metoprolol succinate  100 mg Oral Daily   mirabegron ER  50 mg Oral Daily   pravastatin  40 mg Oral Daily   Continuous Infusions:  methocarbamol (ROBAXIN) IV      Nutritional status     Body mass index is 29.28 kg/m.  Data Reviewed:   CBC: Recent Labs  Lab 03/14/23 1900 03/15/23 0233 03/16/23 0452 03/17/23 0154 03/18/23 0448  WBC 15.4* 16.7* 11.9* 12.2* 8.3  NEUTROABS 13.1*  --   --  8.2*  --   HGB 15.9* 14.1 12.0 11.2* 11.7*  HCT 50.2* 43.2 37.3 36.0 37.2  MCV 92.8 91.1 91.0 92.5 91.9  PLT 142* 147* 147* 163 148*    Basic Metabolic Panel: Recent Labs  Lab 03/14/23 1900 03/15/23 0233 03/16/23 0452 03/17/23 0154 03/18/23 0448  NA 136  --  131* 133* 137  K 5.2*  --  4.9 5.0 3.6  CL 96*  --  99 97* 104  CO2 27  --  25 27 26   GLUCOSE 184*  --  477* 122* 133*  BUN 23  --  37* 45* 27*  CREATININE 0.78 0.96 1.27* 1.32* 0.77  CALCIUM 10.0  --  8.7* 9.2 8.7*  MG  --   --   --   --  1.9    GFR: Estimated Creatinine Clearance: 69.1 mL/min (by C-G formula based on SCr of 0.77 mg/dL). Liver Function Tests: Recent Labs  Lab 03/14/23 1900 03/17/23 0154  AST 27 24  ALT 26 10  ALKPHOS 100 86  BILITOT 1.2 0.3  PROT 6.8 5.9*  ALBUMIN 3.7 3.0*    No results for input(s): "LIPASE", "AMYLASE" in the last 168 hours. No results for input(s): "AMMONIA" in the last 168 hours. Coagulation Profile: No results for input(s): "INR", "PROTIME" in the last 168 hours. Cardiac Enzymes: No results for input(s): "CKTOTAL", "CKMB", "CKMBINDEX", "TROPONINI" in the last 168 hours. BNP  (last 3 results) No results for input(s): "PROBNP" in the last 8760 hours. HbA1C: No results for input(s): "HGBA1C" in the last 72 hours. CBG: Recent Labs  Lab 03/18/23 1630 03/18/23 2048 03/18/23 2108 03/19/23 0738 03/19/23 1136  GLUCAP 201* 192* 182* 185* 221*    Lipid Profile: No results for input(s): "CHOL", "HDL", "LDLCALC", "TRIG", "CHOLHDL", "LDLDIRECT" in the last 72 hours. Thyroid Function Tests: No results for input(s): "TSH", "T4TOTAL", "FREET4", "T3FREE", "THYROIDAB" in the last 72 hours. Anemia Panel: No results for input(s): "VITAMINB12", "FOLATE", "FERRITIN", "TIBC", "IRON", "RETICCTPCT" in the last 72 hours. Sepsis Labs: No results for input(s): "PROCALCITON", "LATICACIDVEN"  in the last 168 hours.  Recent Results (from the past 240 hour(s))  Surgical pcr screen     Status: None   Collection Time: 03/15/23  9:01 AM   Specimen: Nasal Mucosa; Nasal Swab  Result Value Ref Range Status   MRSA, PCR NEGATIVE NEGATIVE Final   Staphylococcus aureus NEGATIVE NEGATIVE Final    Comment: Performed at [facility], [address] [place], [place] [address]         Radiology Studies: No results found.         LOS: 5 days   Time spent= 35 mins    Barnetta Chapel, MD [facility]  If 7PM-7AM, please contact night-coverage  03/19/2023, 2:15 PM

## 2023-03-19 NOTE — Progress Notes (Signed)
Physical Therapy Treatment Patient Details Name: [name] MRN: [id number] DOB: 01-07-1951 Today's Date: 03/19/2023   History of Present Illness 72 year old female admitted 4/28 for fall, now s/p ORIF right periprosthetic distal femur fracture, ORIF left trimalleolar ankle fracture on 4/29. PMHx: of chronic A-fib not on anticoagulation due to prior history of subarachnoid hemorrhage, chronic diastolic heart failure, hypertension, type 1 diabetes on chronic insulin, obesity, hyperlipidemia.    PT Comments    Pt was seen for instruction to nursing regarding transition to bed with safe avoidance of WB on LLE and protection of surgery joints.  Pt is expecting to go to rehab, and will still recommend this due to her limited use of LE's to move onto bed, her WB limitation on LLE and her safety concerns with core control and ability to verbalize the steps to get the process initiated.  Follow along as PT is ordered, focusing on acute PT goals as outlined on POC.   Recommendations for follow up therapy are one component of a multi-disciplinary discharge planning process, led by the attending physician.  Recommendations may be updated based on patient status, additional functional criteria and insurance authorization.  Follow Up Recommendations  Can patient physically be transported by private vehicle: No    Assistance Recommended at Discharge Frequent or constant Supervision/Assistance  Patient can return home with the following Two people to help with walking and/or transfers;Two people to help with bathing/dressing/bathroom;Assistance with cooking/housework;Assist for transportation;Help with stairs or ramp for entrance   Equipment Recommendations  None recommended by PT    Recommendations for Other Services Rehab consult     Precautions / Restrictions Precautions Precautions: Fall Precaution Comments: RLE pain with movement Restrictions Weight Bearing Restrictions: Yes RLE Weight Bearing:  Weight bearing as tolerated LLE Weight Bearing: Non weight bearing     Mobility  Bed Mobility Overal bed mobility: Needs Assistance Bed Mobility: Sit to Supine       Sit to supine: Max assist, +2 for physical assistance, +2 for safety/equipment        Transfers Overall transfer level: Needs assistance Equipment used: 2 person hand held assist Transfers: Bed to chair/wheelchair/BSC         Anterior-Posterior transfers: Max assist, +2 safety/equipment, +2 physical assistance   General transfer comment: used third nursing staff to allow for enough assistance for LE's and for managing pain on RLE    Ambulation/Gait                   Stairs             Wheelchair Mobility    Modified Rankin (Stroke Patients Only)       Balance Overall balance assessment: Needs assistance Sitting-balance support: Bilateral upper extremity supported Sitting balance-Leahy Scale: Fair Sitting balance - Comments: fair once on bed, easily thrown off balance                                    Cognition Arousal/Alertness: Awake/alert Behavior During Therapy: WFL for tasks assessed/performed Overall Cognitive Status: Within Functional Limits for tasks assessed                                          Exercises      General Comments General comments (skin integrity, edema, etc.): Pt is up to chair  when PT arrived, asking to return to bed.  CNA and LPN were educated on her transfer with chair to side of bed and scooting directly in with chair pad and gentle pivot to lie down supine      Pertinent Vitals/Pain Pain Assessment Pain Assessment: Faces Faces Pain Scale: Hurts even more Pain Location: RLE Pain Descriptors / Indicators: Guarding, Grimacing, Sore, Spasm Pain Intervention(s): Limited activity within patient's tolerance, Monitored during session, Repositioned    Home Living                          Prior Function             PT Goals (current goals can now be found in the care plan section) Acute Rehab PT Goals Patient Stated Goal: asking to be returned to bed PT Goal Formulation: With patient Time For Goal Achievement: 03/30/23 Potential to Achieve Goals: Good Progress towards PT goals: Progressing toward goals    Frequency    Min 4X/week      PT Plan Current plan remains appropriate    Co-evaluation              AM-PAC PT "6 Clicks" Mobility   Outcome Measure  Help needed turning from your back to your side while in a flat bed without using bedrails?: A Lot Help needed moving from lying on your back to sitting on the side of a flat bed without using bedrails?: A Lot Help needed moving to and from a bed to a chair (including a wheelchair)?: Total Help needed standing up from a chair using your arms (e.g., wheelchair or bedside chair)?: Total Help needed to walk in hospital room?: Total Help needed climbing 3-5 steps with a railing? : Total 6 Click Score: 8    End of Session Equipment Utilized During Treatment: Other (comment) (chair pad) Activity Tolerance: Patient limited by fatigue;Patient limited by pain;Treatment limited secondary to medical complications (Comment) Patient left: in bed;with call bell/phone within reach;with bed alarm set;with nursing/sitter in room Nurse Communication: Mobility status;Precautions PT Visit Diagnosis: Muscle weakness (generalized) (M62.81);History of falling (Z91.81);Difficulty in walking, not elsewhere classified (R26.2);Pain Pain - Right/Left: Right Pain - part of body: Hip;Knee     Time: 1610-9604 PT Time Calculation (min) (ACUTE ONLY): 14 min  Charges:  $Therapeutic Activity: 8-22 mins         Ivar Drape 03/19/2023, 4:13 PM  Samul Dada, PT PhD Acute Rehab Dept. Number: [facility] [id number] and [facility] [phone]

## 2023-03-19 NOTE — Plan of Care (Signed)

## 2023-03-19 NOTE — Progress Notes (Signed)
Physical Therapy Treatment Patient Details Name: [name] MRN: [id number] DOB: 1951/02/09 Today's Date: 03/19/2023   History of Present Illness 72 year old female admitted 4/28 for fall, now s/p ORIF right periprosthetic distal femur fracture, ORIF left trimalleolar ankle fracture on 4/29. PMHx: of chronic A-fib not on anticoagulation due to prior history of subarachnoid hemorrhage, chronic diastolic heart failure, hypertension, type 1 diabetes on chronic insulin, obesity, hyperlipidemia.    PT Comments    Pt received in supine and agreeable to session. Pt demonstrating improved ability to sit EOB with up to min A. Pt with limited R knee flexion due to pain, preventing pt from being able to position it optimally for standing. Attempted one stand from EOB, however pt unable to tolerate RLE WB and maintain LLE NWB. Pt requiring total A +2 to squat pivot to recliner due to weakness and increased RLE pain. Pt continues to benefit from PT services to progress toward functional mobility goals.    Recommendations for follow up therapy are one component of a multi-disciplinary discharge planning process, led by the attending physician.  Recommendations may be updated based on patient status, additional functional criteria and insurance authorization.     Assistance Recommended at Discharge Frequent or constant Supervision/Assistance  Patient can return home with the following Two people to help with walking and/or transfers;Two people to help with bathing/dressing/bathroom;Assistance with cooking/housework;Assist for transportation;Help with stairs or ramp for entrance   Equipment Recommendations  None recommended by PT    Recommendations for Other Services       Precautions / Restrictions Precautions Precautions: Fall Precaution Comments: RLE pain with movement Restrictions Weight Bearing Restrictions: Yes RLE Weight Bearing: Weight bearing as tolerated LLE Weight Bearing: Non weight  bearing     Mobility  Bed Mobility Overal bed mobility: Needs Assistance Bed Mobility: Supine to Sit     Supine to sit: Min assist     General bed mobility comments: increased time, but pt with good ability to advance BLE to EOB and pull up using bed rail with min A for trunk elevation.    Transfers Overall transfer level: Needs assistance Equipment used: Rolling walker (2 wheels) Transfers: Sit to/from Stand, Bed to chair/wheelchair/BSC Sit to Stand: Total assist, +2 physical assistance     Squat pivot transfers: Total assist, +2 physical assistance     General transfer comment: Unable to tolerate WB through RLE to elevate hips off of EOB due to pain. Use of bed pad and total A +2 to pivot to recliner.    Ambulation/Gait               General Gait Details: unable      Balance Overall balance assessment: Needs assistance, History of Falls Sitting-balance support: No upper extremity supported, Feet supported Sitting balance-Leahy Scale: Fair Sitting balance - Comments: sitting EOB     Standing balance-Leahy Scale: Zero Standing balance comment: unable                            Cognition Arousal/Alertness: Awake/alert Behavior During Therapy: WFL for tasks assessed/performed Overall Cognitive Status: Within Functional Limits for tasks assessed                                          Exercises      General Comments        Pertinent  Vitals/Pain Pain Assessment Pain Assessment: 0-10 Pain Score: 6  Pain Location: RLE Pain Descriptors / Indicators: Aching, Discomfort, Grimacing, Guarding, Crying Pain Intervention(s): Monitored during session, Repositioned     PT Goals (current goals can now be found in the care plan section) Acute Rehab PT Goals Patient Stated Goal: Get well at rehab before going home PT Goal Formulation: With patient Time For Goal Achievement: 03/30/23 Potential to Achieve Goals: Good Progress  towards PT goals: Progressing toward goals    Frequency    Min 4X/week      PT Plan Current plan remains appropriate       AM-PAC PT "6 Clicks" Mobility   Outcome Measure  Help needed turning from your back to your side while in a flat bed without using bedrails?: A Little Help needed moving from lying on your back to sitting on the side of a flat bed without using bedrails?: A Little Help needed moving to and from a bed to a chair (including a wheelchair)?: Total Help needed standing up from a chair using your arms (e.g., wheelchair or bedside chair)?: Total Help needed to walk in hospital room?: Total Help needed climbing 3-5 steps with a railing? : Total 6 Click Score: 10    End of Session Equipment Utilized During Treatment: Gait belt Activity Tolerance: Patient limited by pain Patient left: in chair;with call bell/phone within reach;with family/visitor present;with chair alarm set Nurse Communication: Mobility status;Need for lift equipment PT Visit Diagnosis: Muscle weakness (generalized) (M62.81);History of falling (Z91.81);Difficulty in walking, not elsewhere classified (R26.2);Pain Pain - Right/Left: Right Pain - part of body: Hip;Knee     Time: 1610-9604 PT Time Calculation (min) (ACUTE ONLY): 22 min  Charges:  $Therapeutic Activity: 8-22 mins                     Johny Shock, PTA Acute Rehabilitation Services Secure Chat Preferred  [phone]    Johny Shock 03/19/2023, 1:01 PM

## 2023-03-20 DIAGNOSIS — S72401A Unspecified fracture of lower end of right femur, initial encounter for closed fracture: Secondary | ICD-10-CM | POA: Diagnosis not present

## 2023-03-20 LAB — GLUCOSE, CAPILLARY
Glucose-Capillary: 217 mg/dL — ABNORMAL HIGH (ref 70–99)
Glucose-Capillary: 260 mg/dL — ABNORMAL HIGH (ref 70–99)
Glucose-Capillary: 131 mg/dL — ABNORMAL HIGH (ref 70–99)
Glucose-Capillary: 95 mg/dL (ref 70–99)

## 2023-03-20 NOTE — Plan of Care (Signed)
  Problem: Coping: Goal: Ability to adjust to condition or change in health will improve Outcome: Progressing   Problem: Fluid Volume: Goal: Ability to maintain a balanced intake and output will improve Outcome: Progressing   Problem: Metabolic: Goal: Ability to maintain appropriate glucose levels will improve Outcome: Progressing   Problem: Nutritional: Goal: Maintenance of adequate nutrition will improve Outcome: Progressing   Problem: Pain Managment: Goal: General experience of comfort will improve Outcome: Progressing

## 2023-03-20 NOTE — Progress Notes (Signed)
PROGRESS NOTE    Kopelynn Babel  ZOX:[id number] DOB: 11-12-1951 DOA: 03/14/2023 PCP: Garlan Fillers, MD   Brief Narrative:  72 year old female history of chronic A-fib not on anticoagulation due to prior history of subarachnoid hemorrhage, chronic diastolic heart failure, hypertension, type 1 diabetes on chronic insulin, obesity, hyperlipidemia presents to the ER today after having a ground-level fall.  Patient was evaluated and determined to have right femur distal fracture periprosthetic and also a left trimalleolar fracture.  Patient evaluated by orthopedic surgery underwent open reduction internal fixation of both fractures 4/29.   03/19/2023: Patient seen alongside patient's husband.  No new complaints.  Awaiting disposition.  The plan is to discharge patient to a rehab facility.  03/20/2023: Patient seen.  No new changes.  Patient is awaiting disposition.   Assessment & Plan:  Principal Problem:   Closed fracture of distal end of right femur (HCC) Active Problems:   Dyslipidemia   Essential hypertension   Type 1 diabetes mellitus on insulin therapy (HCC)   A-fib (HCC)   Chronic diastolic congestive heart failure (HCC)   Class 2 obesity   Closed fibular fracture   Closed displaced trimalleolar fracture of left ankle     Closed fracture of distal end of right femur (HCC) Closed fibular fracture/trimalleolar fracture Patient tolerated ORIF to right distal femur and left ankle well.  Hemoglobin stable.  Postop recommendations, DVT prophylaxis, weightbearing precautions, pain control and follow-up per orthopedic team. 03/19/2023: Pursue disposition.  Acute kidney injury - Baseline creatinine 0.7, this is trended up.  This morning 1.32.  Gentle hydration, will hold nephrotoxic drugs including Lasix, losartan and Aldactone today.  Gentle 500 cc normal saline bolus 03/19/2023: AKI has resolved.   Leukocytosis: Stable Reactive.  No signs of infection 03/19/2023: Leukocytosis is resolved     Class 2 obesity Chronic.   Chronic diastolic congestive heart failure (HCC) Overall appears to be stable and euvolemic.  Continue home medications including Toprol-XL.  Nephrotoxic drugs on hold due to AKI 03/18/2023: Compensated.  A-fib (HCC) On Cardizem, not on systemic anticoagulation due to history of subarachnoid hemorrhage   Type 1 diabetes mellitus on insulin therapy (HCC) On sliding scale and Accu-Chek.  NovoLog 3 units Premeal, Semglee 6 units twice daily   Essential hypertension Stable.   Dyslipidemia Pravachol  PT-CIR  DVT prophylaxis: Lovenox Code Status: Full code Family Communication:   Status is: Inpatient Will need placement to CIR       Diet Orders (From admission, onward)     Start     Ordered   03/15/23 1648  Diet Heart Room service appropriate? Yes; Fluid consistency: Thin  Diet effective now       Question Answer Comment  Room service appropriate? Yes   Fluid consistency: Thin      03/15/23 1647            Subjective: No chest pain. No shortness of breath No fever or chills.   Examination:  General exam: Appears calm and comfortable  Respiratory system: Clear to auscultation.  Cardiovascular system: S1 & S2 heard. Gastrointestinal system: Abdomen is obese, soft and nontender.   Central nervous system: Alert and oriented.  Patient moves all extremities. Extremities: Left lower leg is wrapped and ?splinted..  Objective: Vitals:   03/19/23 1326 03/19/23 1914 03/20/23 0500 03/20/23 0847  BP: 132/70 123/61  (!) 143/80  Pulse: 90 90  90  Resp: 20 18  16   Temp: 98.2 F (36.8 C) 98.3 F (36.8 C)  98.1  F (36.7 C)  TempSrc: Oral Oral    SpO2: 96% 96%  96%  Weight:   84.6 kg   Height:        Intake/Output Summary (Last 24 hours) at 03/20/2023 1512 Last data filed at 03/20/2023 1339 Gross per 24 hour  Intake 476 ml  Output --  Net 476 ml    Filed Weights   03/18/23 0500 03/19/23 0600 03/20/23 0500  Weight: 97.2 kg 84.8 kg  84.6 kg    Scheduled Meds:  diltiazem  180 mg Oral Daily   docusate sodium  100 mg Oral BID   DULoxetine  60 mg Oral Daily   enoxaparin (LOVENOX) injection  40 mg Subcutaneous Q24H   gabapentin  100 mg Oral QHS   insulin aspart  0-5 Units Subcutaneous QHS   insulin aspart  0-9 Units Subcutaneous TID WC   insulin aspart  3 Units Subcutaneous TID WC   insulin glargine-yfgn  6 Units Subcutaneous BID   metoprolol succinate  100 mg Oral Daily   mirabegron ER  50 mg Oral Daily   pravastatin  40 mg Oral Daily   Continuous Infusions:  methocarbamol (ROBAXIN) IV      Nutritional status     Body mass index is 29.21 kg/m.  Data Reviewed:   CBC: Recent Labs  Lab 03/14/23 1900 03/15/23 0233 03/16/23 0452 03/17/23 0154 03/18/23 0448  WBC 15.4* 16.7* 11.9* 12.2* 8.3  NEUTROABS 13.1*  --   --  8.2*  --   HGB 15.9* 14.1 12.0 11.2* 11.7*  HCT 50.2* 43.2 37.3 36.0 37.2  MCV 92.8 91.1 91.0 92.5 91.9  PLT 142* 147* 147* 163 148*    Basic Metabolic Panel: Recent Labs  Lab 03/14/23 1900 03/15/23 0233 03/16/23 0452 03/17/23 0154 03/18/23 0448  NA 136  --  131* 133* 137  K 5.2*  --  4.9 5.0 3.6  CL 96*  --  99 97* 104  CO2 27  --  25 27 26   GLUCOSE 184*  --  477* 122* 133*  BUN 23  --  37* 45* 27*  CREATININE 0.78 0.96 1.27* 1.32* 0.77  CALCIUM 10.0  --  8.7* 9.2 8.7*  MG  --   --   --   --  1.9    GFR: Estimated Creatinine Clearance: 69 mL/min (by C-G formula based on SCr of 0.77 mg/dL). Liver Function Tests: Recent Labs  Lab 03/14/23 1900 03/17/23 0154  AST 27 24  ALT 26 10  ALKPHOS 100 86  BILITOT 1.2 0.3  PROT 6.8 5.9*  ALBUMIN 3.7 3.0*    No results for input(s): "LIPASE", "AMYLASE" in the last 168 hours. No results for input(s): "AMMONIA" in the last 168 hours. Coagulation Profile: No results for input(s): "INR", "PROTIME" in the last 168 hours. Cardiac Enzymes: No results for input(s): "CKTOTAL", "CKMB", "CKMBINDEX", "TROPONINI" in the last 168  hours. BNP (last 3 results) No results for input(s): "PROBNP" in the last 8760 hours. HbA1C: No results for input(s): "HGBA1C" in the last 72 hours. CBG: Recent Labs  Lab 03/19/23 1136 03/19/23 1633 03/19/23 2229 03/20/23 0844 03/20/23 1143  GLUCAP 221* 168* 143* 217* 260*    Lipid Profile: No results for input(s): "CHOL", "HDL", "LDLCALC", "TRIG", "CHOLHDL", "LDLDIRECT" in the last 72 hours. Thyroid Function Tests: No results for input(s): "TSH", "T4TOTAL", "FREET4", "T3FREE", "THYROIDAB" in the last 72 hours. Anemia Panel: No results for input(s): "VITAMINB12", "FOLATE", "FERRITIN", "TIBC", "IRON", "RETICCTPCT" in the last 72 hours. Sepsis Labs:  No results for input(s): "PROCALCITON", "LATICACIDVEN" in the last 168 hours.  Recent Results (from the past 240 hour(s))  Surgical pcr screen     Status: None   Collection Time: 03/15/23  9:01 AM   Specimen: Nasal Mucosa; Nasal Swab  Result Value Ref Range Status   MRSA, PCR NEGATIVE NEGATIVE Final   Staphylococcus aureus NEGATIVE NEGATIVE Final    Comment: Performed at [facility], [address] [place], [place] [address]         Radiology Studies: No results found.         LOS: 6 days   Time spent= 35 mins    Barnetta Chapel, MD [facility]  If 7PM-7AM, please contact night-coverage  03/20/2023, 3:12 PM

## 2023-03-21 DIAGNOSIS — S72401A Unspecified fracture of lower end of right femur, initial encounter for closed fracture: Secondary | ICD-10-CM | POA: Diagnosis not present

## 2023-03-21 LAB — GLUCOSE, CAPILLARY
Glucose-Capillary: 150 mg/dL — ABNORMAL HIGH (ref 70–99)
Glucose-Capillary: 208 mg/dL — ABNORMAL HIGH (ref 70–99)
Glucose-Capillary: 149 mg/dL — ABNORMAL HIGH (ref 70–99)
Glucose-Capillary: 114 mg/dL — ABNORMAL HIGH (ref 70–99)

## 2023-03-21 MED ORDER — ALUM & MAG HYDROXIDE-SIMETH 200-200-20 MG/5ML PO SUSP
15.0000 mL | Freq: Once | ORAL | Status: AC
  Administered 2023-03-21: 30 mL via ORAL
  Filled 2023-03-21: qty 30

## 2023-03-21 NOTE — Plan of Care (Signed)
  Problem: Health Behavior/Discharge Planning: Goal: Ability to manage health-related needs will improve Outcome: Progressing   Problem: Nutritional: Goal: Maintenance of adequate nutrition will improve Outcome: Progressing   Problem: Health Behavior/Discharge Planning: Goal: Ability to manage health-related needs will improve Outcome: Progressing

## 2023-03-21 NOTE — Progress Notes (Signed)
PROGRESS NOTE    [name]  ZOX:[id number] DOB: 03/24/1951 DOA: 03/14/2023 PCP: Garlan Fillers, MD   Brief Narrative:  72 year old female history of chronic A-fib not on anticoagulation due to prior history of subarachnoid hemorrhage, chronic diastolic heart failure, hypertension, type 1 diabetes on chronic insulin, obesity, hyperlipidemia presents to the ER today after having a ground-level fall.  Patient was evaluated and determined to have right femur distal fracture periprosthetic and also a left trimalleolar fracture.  Patient evaluated by orthopedic surgery underwent open reduction internal fixation of both fractures 4/29.   03/19/2023: Patient seen alongside patient's husband.  No new complaints.  Awaiting disposition.  The plan is to discharge patient to a rehab facility.  03/21/2023: Patient seen.  No new changes.  Patient is awaiting disposition.  Patient is stable for discharge.   Assessment & Plan:  Principal Problem:   Closed fracture of distal end of right femur (HCC) Active Problems:   Dyslipidemia   Essential hypertension   Type 1 diabetes mellitus on insulin therapy (HCC)   A-fib (HCC)   Chronic diastolic congestive heart failure (HCC)   Class 2 obesity   Closed fibular fracture   Closed displaced trimalleolar fracture of left ankle    Closed fracture of distal end of right femur (HCC) Closed fibular fracture/trimalleolar fracture Patient tolerated ORIF to right distal femur and left ankle well.  Hemoglobin stable.  Postop recommendations, DVT prophylaxis, weightbearing precautions, pain control and follow-up per orthopedic team. 03/21/2023: Pursue disposition.  Acute kidney injury - Baseline creatinine 0.7, this is trended up.  This morning 1.32.  Gentle hydration, will hold nephrotoxic drugs including Lasix, losartan and Aldactone today.  Gentle 500 cc normal saline bolus 03/21/2023: AKI has resolved.   Leukocytosis: Stable Reactive.  No signs of  infection 03/21/2023: Leukocytosis is resolved    Class 2 obesity Chronic.   Chronic diastolic congestive heart failure (HCC) Overall appears to be stable and euvolemic.  Continue home medications including Toprol-XL.  Nephrotoxic drugs on hold due to AKI 03/21/2023: Compensated.  A-fib (HCC) On Cardizem, not on systemic anticoagulation due to history of subarachnoid hemorrhage   Type 1 diabetes mellitus on insulin therapy (HCC) On sliding scale and Accu-Chek.  NovoLog 3 units Premeal, Semglee 6 units twice daily   Essential hypertension Stable.   Dyslipidemia Pravachol  PT-CIR  DVT prophylaxis: Lovenox Code Status: Full code Family Communication:   Status is: Inpatient Will need placement to CIR       Diet Orders (From admission, onward)     Start     Ordered   03/15/23 1648  Diet Heart Room service appropriate? Yes; Fluid consistency: Thin  Diet effective now       Question Answer Comment  Room service appropriate? Yes   Fluid consistency: Thin      03/15/23 1647            Subjective: No chest pain. No shortness of breath No fever or chills.   Examination:  General exam: Appears calm and comfortable  Respiratory system: Clear to auscultation.  Cardiovascular system: S1 & S2 heard. Gastrointestinal system: Abdomen is obese, soft and nontender.   Central nervous system: Alert and oriented.  Patient moves all extremities. Extremities: Left lower leg is wrapped and ?splinted..  Objective: Vitals:   03/20/23 0847 03/20/23 1752 03/20/23 2159 03/21/23 0900  BP: (!) 143/80 120/69 115/60 (!) 134/59  Pulse: 90 83 98 99  Resp: 16 16    Temp: 98.1 F (36.7 C)  98.5 F (36.9 C) 97.9 F (36.6 C) 98.6 F (37 C)  TempSrc:  Oral Oral Oral  SpO2: 96% 98% 98% 98%  Weight:      Height:        Intake/Output Summary (Last 24 hours) at 03/21/2023 1708 Last data filed at 03/20/2023 2022 Gross per 24 hour  Intake 240 ml  Output --  Net 240 ml    Filed  Weights   03/18/23 0500 03/19/23 0600 03/20/23 0500  Weight: 97.2 kg 84.8 kg 84.6 kg    Scheduled Meds:  diltiazem  180 mg Oral Daily   docusate sodium  100 mg Oral BID   DULoxetine  60 mg Oral Daily   enoxaparin (LOVENOX) injection  40 mg Subcutaneous Q24H   gabapentin  100 mg Oral QHS   insulin aspart  0-5 Units Subcutaneous QHS   insulin aspart  0-9 Units Subcutaneous TID WC   insulin glargine-yfgn  6 Units Subcutaneous BID   metoprolol succinate  100 mg Oral Daily   mirabegron ER  50 mg Oral Daily   pravastatin  40 mg Oral Daily   Continuous Infusions:  methocarbamol (ROBAXIN) IV      Nutritional status     Body mass index is 29.21 kg/m.  Data Reviewed:   CBC: Recent Labs  Lab 03/14/23 1900 03/15/23 0233 03/16/23 0452 03/17/23 0154 03/18/23 0448  WBC 15.4* 16.7* 11.9* 12.2* 8.3  NEUTROABS 13.1*  --   --  8.2*  --   HGB 15.9* 14.1 12.0 11.2* 11.7*  HCT 50.2* 43.2 37.3 36.0 37.2  MCV 92.8 91.1 91.0 92.5 91.9  PLT 142* 147* 147* 163 148*    Basic Metabolic Panel: Recent Labs  Lab 03/14/23 1900 03/15/23 0233 03/16/23 0452 03/17/23 0154 03/18/23 0448  NA 136  --  131* 133* 137  K 5.2*  --  4.9 5.0 3.6  CL 96*  --  99 97* 104  CO2 27  --  25 27 26   GLUCOSE 184*  --  477* 122* 133*  BUN 23  --  37* 45* 27*  CREATININE 0.78 0.96 1.27* 1.32* 0.77  CALCIUM 10.0  --  8.7* 9.2 8.7*  MG  --   --   --   --  1.9    GFR: Estimated Creatinine Clearance: 69 mL/min (by C-G formula based on SCr of 0.77 mg/dL). Liver Function Tests: Recent Labs  Lab 03/14/23 1900 03/17/23 0154  AST 27 24  ALT 26 10  ALKPHOS 100 86  BILITOT 1.2 0.3  PROT 6.8 5.9*  ALBUMIN 3.7 3.0*    No results for input(s): "LIPASE", "AMYLASE" in the last 168 hours. No results for input(s): "AMMONIA" in the last 168 hours. Coagulation Profile: No results for input(s): "INR", "PROTIME" in the last 168 hours. Cardiac Enzymes: No results for input(s): "CKTOTAL", "CKMB", "CKMBINDEX",  "TROPONINI" in the last 168 hours. BNP (last 3 results) No results for input(s): "PROBNP" in the last 8760 hours. HbA1C: No results for input(s): "HGBA1C" in the last 72 hours. CBG: Recent Labs  Lab 03/20/23 1755 03/20/23 2032 03/21/23 0734 03/21/23 1126 03/21/23 1616  GLUCAP 131* 95 150* 208* 149*    Lipid Profile: No results for input(s): "CHOL", "HDL", "LDLCALC", "TRIG", "CHOLHDL", "LDLDIRECT" in the last 72 hours. Thyroid Function Tests: No results for input(s): "TSH", "T4TOTAL", "FREET4", "T3FREE", "THYROIDAB" in the last 72 hours. Anemia Panel: No results for input(s): "VITAMINB12", "FOLATE", "FERRITIN", "TIBC", "IRON", "RETICCTPCT" in the last 72 hours. Sepsis Labs: No results  for input(s): "PROCALCITON", "LATICACIDVEN" in the last 168 hours.  Recent Results (from the past 240 hour(s))  Surgical pcr screen     Status: None   Collection Time: 03/15/23  9:01 AM   Specimen: Nasal Mucosa; Nasal Swab  Result Value Ref Range Status   MRSA, PCR NEGATIVE NEGATIVE Final   Staphylococcus aureus NEGATIVE NEGATIVE Final    Comment: Performed at [facility], [address] [place], [place] [address]         Radiology Studies: No results found.         LOS: 7 days   Time spent= 35 mins    Barnetta Chapel, MD [facility]  If 7PM-7AM, please contact night-coverage  03/21/2023, 5:08 PM

## 2023-03-21 NOTE — Plan of Care (Signed)
  Problem: Health Behavior/Discharge Planning: Goal: Ability to manage health-related needs will improve Outcome: Progressing   Problem: Metabolic: Goal: Ability to maintain appropriate glucose levels will improve Outcome: Progressing   Problem: Health Behavior/Discharge Planning: Goal: Ability to manage health-related needs will improve Outcome: Progressing

## 2023-03-22 ENCOUNTER — Other Ambulatory Visit (HOSPITAL_COMMUNITY): Payer: Self-pay

## 2023-03-22 DIAGNOSIS — I4891 Unspecified atrial fibrillation: Secondary | ICD-10-CM | POA: Diagnosis not present

## 2023-03-22 DIAGNOSIS — H4052X3 Glaucoma secondary to other eye disorders, left eye, severe stage: Secondary | ICD-10-CM | POA: Diagnosis not present

## 2023-03-22 DIAGNOSIS — G8918 Other acute postprocedural pain: Secondary | ICD-10-CM | POA: Diagnosis not present

## 2023-03-22 DIAGNOSIS — T84018A Broken internal joint prosthesis, other site, initial encounter: Secondary | ICD-10-CM | POA: Diagnosis not present

## 2023-03-22 DIAGNOSIS — S82852D Displaced trimalleolar fracture of left lower leg, subsequent encounter for closed fracture with routine healing: Secondary | ICD-10-CM | POA: Diagnosis not present

## 2023-03-22 DIAGNOSIS — S72491D Other fracture of lower end of right femur, subsequent encounter for closed fracture with routine healing: Secondary | ICD-10-CM | POA: Diagnosis not present

## 2023-03-22 DIAGNOSIS — E669 Obesity, unspecified: Secondary | ICD-10-CM | POA: Diagnosis not present

## 2023-03-22 DIAGNOSIS — I5032 Chronic diastolic (congestive) heart failure: Secondary | ICD-10-CM | POA: Diagnosis not present

## 2023-03-22 DIAGNOSIS — T84028A Dislocation of other internal joint prosthesis, initial encounter: Secondary | ICD-10-CM | POA: Diagnosis not present

## 2023-03-22 DIAGNOSIS — S82852A Displaced trimalleolar fracture of left lower leg, initial encounter for closed fracture: Secondary | ICD-10-CM | POA: Diagnosis not present

## 2023-03-22 DIAGNOSIS — K5900 Constipation, unspecified: Secondary | ICD-10-CM | POA: Diagnosis not present

## 2023-03-22 DIAGNOSIS — M25511 Pain in right shoulder: Secondary | ICD-10-CM | POA: Diagnosis not present

## 2023-03-22 DIAGNOSIS — S72401D Unspecified fracture of lower end of right femur, subsequent encounter for closed fracture with routine healing: Secondary | ICD-10-CM | POA: Diagnosis not present

## 2023-03-22 DIAGNOSIS — G8911 Acute pain due to trauma: Secondary | ICD-10-CM | POA: Diagnosis not present

## 2023-03-22 DIAGNOSIS — R2681 Unsteadiness on feet: Secondary | ICD-10-CM | POA: Diagnosis not present

## 2023-03-22 DIAGNOSIS — H00015 Hordeolum externum left lower eyelid: Secondary | ICD-10-CM | POA: Diagnosis not present

## 2023-03-22 DIAGNOSIS — K219 Gastro-esophageal reflux disease without esophagitis: Secondary | ICD-10-CM | POA: Diagnosis not present

## 2023-03-22 DIAGNOSIS — N898 Other specified noninflammatory disorders of vagina: Secondary | ICD-10-CM | POA: Diagnosis not present

## 2023-03-22 DIAGNOSIS — E785 Hyperlipidemia, unspecified: Secondary | ICD-10-CM | POA: Diagnosis not present

## 2023-03-22 DIAGNOSIS — M8589 Other specified disorders of bone density and structure, multiple sites: Secondary | ICD-10-CM | POA: Diagnosis not present

## 2023-03-22 DIAGNOSIS — M1712 Unilateral primary osteoarthritis, left knee: Secondary | ICD-10-CM | POA: Diagnosis not present

## 2023-03-22 DIAGNOSIS — I482 Chronic atrial fibrillation, unspecified: Secondary | ICD-10-CM | POA: Diagnosis not present

## 2023-03-22 DIAGNOSIS — M9711XD Periprosthetic fracture around internal prosthetic right knee joint, subsequent encounter: Secondary | ICD-10-CM | POA: Diagnosis not present

## 2023-03-22 DIAGNOSIS — D649 Anemia, unspecified: Secondary | ICD-10-CM | POA: Diagnosis not present

## 2023-03-22 DIAGNOSIS — R5381 Other malaise: Secondary | ICD-10-CM | POA: Diagnosis not present

## 2023-03-22 DIAGNOSIS — Z9889 Other specified postprocedural states: Secondary | ICD-10-CM | POA: Diagnosis not present

## 2023-03-22 DIAGNOSIS — M19011 Primary osteoarthritis, right shoulder: Secondary | ICD-10-CM | POA: Diagnosis not present

## 2023-03-22 DIAGNOSIS — Z7901 Long term (current) use of anticoagulants: Secondary | ICD-10-CM | POA: Diagnosis not present

## 2023-03-22 DIAGNOSIS — W19XXXD Unspecified fall, subsequent encounter: Secondary | ICD-10-CM | POA: Diagnosis not present

## 2023-03-22 DIAGNOSIS — W19XXXA Unspecified fall, initial encounter: Secondary | ICD-10-CM | POA: Diagnosis not present

## 2023-03-22 DIAGNOSIS — E1065 Type 1 diabetes mellitus with hyperglycemia: Secondary | ICD-10-CM | POA: Diagnosis not present

## 2023-03-22 DIAGNOSIS — M25562 Pain in left knee: Secondary | ICD-10-CM | POA: Diagnosis not present

## 2023-03-22 DIAGNOSIS — I509 Heart failure, unspecified: Secondary | ICD-10-CM | POA: Diagnosis not present

## 2023-03-22 DIAGNOSIS — T84197A Other mechanical complication of internal fixation device of bone of left lower leg, initial encounter: Secondary | ICD-10-CM | POA: Diagnosis not present

## 2023-03-22 DIAGNOSIS — R278 Other lack of coordination: Secondary | ICD-10-CM | POA: Diagnosis not present

## 2023-03-22 DIAGNOSIS — Q782 Osteopetrosis: Secondary | ICD-10-CM | POA: Diagnosis not present

## 2023-03-22 DIAGNOSIS — Z794 Long term (current) use of insulin: Secondary | ICD-10-CM | POA: Diagnosis not present

## 2023-03-22 DIAGNOSIS — I10 Essential (primary) hypertension: Secondary | ICD-10-CM | POA: Diagnosis not present

## 2023-03-22 DIAGNOSIS — Z7689 Persons encountering health services in other specified circumstances: Secondary | ICD-10-CM | POA: Diagnosis not present

## 2023-03-22 DIAGNOSIS — I110 Hypertensive heart disease with heart failure: Secondary | ICD-10-CM | POA: Diagnosis not present

## 2023-03-22 DIAGNOSIS — Z7401 Bed confinement status: Secondary | ICD-10-CM | POA: Diagnosis not present

## 2023-03-22 DIAGNOSIS — E109 Type 1 diabetes mellitus without complications: Secondary | ICD-10-CM | POA: Diagnosis not present

## 2023-03-22 DIAGNOSIS — I83892 Varicose veins of left lower extremities with other complications: Secondary | ICD-10-CM | POA: Diagnosis not present

## 2023-03-22 DIAGNOSIS — F411 Generalized anxiety disorder: Secondary | ICD-10-CM | POA: Diagnosis not present

## 2023-03-22 DIAGNOSIS — M6281 Muscle weakness (generalized): Secondary | ICD-10-CM | POA: Diagnosis not present

## 2023-03-22 DIAGNOSIS — Z6829 Body mass index (BMI) 29.0-29.9, adult: Secondary | ICD-10-CM | POA: Diagnosis not present

## 2023-03-22 DIAGNOSIS — M19042 Primary osteoarthritis, left hand: Secondary | ICD-10-CM | POA: Diagnosis not present

## 2023-03-22 LAB — GLUCOSE, CAPILLARY
Glucose-Capillary: 137 mg/dL — ABNORMAL HIGH (ref 70–99)
Glucose-Capillary: 243 mg/dL — ABNORMAL HIGH (ref 70–99)

## 2023-03-22 LAB — RENAL FUNCTION PANEL
Albumin: 2.5 g/dL — ABNORMAL LOW (ref 3.5–5.0)
Anion gap: 10 (ref 5–15)
BUN: 11 mg/dL (ref 8–23)
CO2: 28 mmol/L (ref 22–32)
Calcium: 8.8 mg/dL — ABNORMAL LOW (ref 8.9–10.3)
Chloride: 101 mmol/L (ref 98–111)
Creatinine, Ser: 0.54 mg/dL (ref 0.44–1.00)
GFR, Estimated: >60 mL/min (ref >60)
Glucose, Bld: 136 mg/dL — ABNORMAL HIGH (ref 70–99)
Phosphorus: 3.8 mg/dL (ref 2.5–4.6)
Potassium: 3.8 mmol/L (ref 3.5–5.1)
Sodium: 139 mmol/L (ref 135–145)

## 2023-03-22 LAB — CBC WITH DIFFERENTIAL/PLATELET
Abs Immature Granulocytes: 0.02 10*3/uL (ref 0.00–0.07)
Basophils Absolute: 0.1 10*3/uL (ref 0.0–0.1)
Basophils Relative: 1 %
Eosinophils Absolute: 0.4 10*3/uL (ref 0.0–0.5)
Eosinophils Relative: 5 %
HCT: 36.5 % (ref 36.0–46.0)
Hemoglobin: 11.4 g/dL — ABNORMAL LOW (ref 12.0–15.0)
Immature Granulocytes: 0 %
Lymphocytes Relative: 25 %
Lymphs Abs: 2 10*3/uL (ref 0.7–4.0)
MCH: 29.2 pg (ref 26.0–34.0)
MCHC: 31.2 g/dL (ref 30.0–36.0)
MCV: 93.4 fL (ref 80.0–100.0)
Monocytes Absolute: 0.8 10*3/uL (ref 0.1–1.0)
Monocytes Relative: 11 %
Neutro Abs: 4.5 10*3/uL (ref 1.7–7.7)
Neutrophils Relative %: 58 %
Platelets: 261 10*3/uL (ref 150–400)
RBC: 3.91 MIL/uL (ref 3.87–5.11)
RDW: 16 % — ABNORMAL HIGH (ref 11.5–15.5)
WBC: 7.7 10*3/uL (ref 4.0–10.5)
nRBC: 0 % (ref 0.0–0.2)

## 2023-03-22 LAB — CREATININE, SERUM
Creatinine, Ser: 0.58 mg/dL (ref 0.44–1.00)
GFR, Estimated: >60 mL/min (ref >60)

## 2023-03-22 LAB — MAGNESIUM: Magnesium: 1.8 mg/dL (ref 1.7–2.4)

## 2023-03-22 MED ORDER — LATANOPROST 0.005 % OP SOLN
1.0000 [drp] | Freq: Every day | OPHTHALMIC | Status: AC
  Administered 2023-03-22: 1 [drp] via OPHTHALMIC
  Filled 2023-03-22: qty 2.5

## 2023-03-22 MED ORDER — APIXABAN 2.5 MG PO TABS
2.5000 mg | ORAL_TABLET | Freq: Two times a day (BID) | ORAL | 0 refills | Status: DC
  Filled 2023-03-22: qty 46, 23d supply, fill #0

## 2023-03-22 NOTE — Discharge Summary (Addendum)
[name] ZOX:[id number] DOB: 01-Dec-1950 DOA: 03/14/2023  PCP: Garlan Fillers, MD  Admit date: 03/14/2023 Discharge date: 03/22/2023  Time spent: 35 minutes  Recommendations for Outpatient Follow-up:  Orthopedic surgery f/u 1 week     Discharge Diagnoses:  Principal Problem:   Closed fracture of distal end of right femur [facility]) Active Problems:   Closed fibular fracture   Dyslipidemia   Essential hypertension   Type 1 diabetes mellitus on insulin therapy (HCC)   A-fib (HCC)   Chronic diastolic congestive heart failure (HCC)   Class 2 obesity   Closed displaced trimalleolar fracture of left ankle   Discharge Condition: stable  Diet recommendation: carb modified  Filed Weights   03/18/23 0500 03/19/23 0600 03/20/23 0500  Weight: 97.2 kg 84.8 kg 84.6 kg    History of present illness:  From admission h and p 72 year old female history of chronic A-fib not on anticoagulation due to prior history of subarachnoid hemorrhage, chronic diastolic heart failure, hypertension, type 1 diabetes on chronic insulin, obesity, hyperlipidemia presents to the ER today after having a ground-level fall.  Patient states that she wheeled herself to the bathroom in a wheelchair.  She stood up and used a walker to try to get into the bathroom.  She lost her balance and fell.  This was witnessed by the husband.  She had immediate pain in her right leg and her left leg.  Patient brought to the ER for she was noted to be hypoglycemic with a initial blood sugar 67.   Arrival temp 90.4 heart rate 60 blood pressure 136/76 satting 90% on room air.   White count 15.4, hemoglobin 15.9, platelets of 142   Sodium 136, potassium 5.2, chloride 96, bicarb 27, BUN 23, creatinine 0.78   Right femur x-rays demonstrated nondisplaced oblique fracture through the distal femur diaphysis setting to the level of knee arthroplasty.  Left tib-fib x-rays demonstrated fracture of the distal left fibula, neck of the left  fifth metatarsal. Dr. Roda Shutters with orthopedics was consulted.  Hospital Course:  Patient presented after a fall. Found to have closed fracture of distal right femur and left fibular/trimalleolar fracture. Operative repair of both performed on 4/29. Routine post-op course, no significant anemia, pain controlled, tolerating by mouth, no constipation. Chronic medical problems stable. Discharged to SNF. WBAT RLE, NWB LLE. F/u orthopedics 1 week. 30 days post-op apixaban for dvt ppx.  Has a hx of subarachnoid hemorrhage in 2007. Reviewed case with neurology, think benefits of ppx outweigh risk and patient is in agreement.   Procedures: See above   Consultations: orthopedics  Discharge Exam: Vitals:   03/21/23 2119 03/22/23 0720  BP: 136/76 (!) 143/73  Pulse: 97 96  Resp: 18 18  Temp: 98 F (36.7 C) 98.3 F (36.8 C)  SpO2: 96% 98%    General: NAD Cardiovascular: RRR Respiratory: CTAB Ext: bandage LLE. Incision sites c/d/I right hip  Discharge Instructions   Discharge Instructions     Diet - low sodium heart healthy   Complete by: As directed    Increase activity slowly   Complete by: As directed       Allergies as of 03/22/2023       Reactions   Codeine Anxiety   Tape Rash        Medication List     STOP taking these medications    aspirin 325 MG tablet   oxyCODONE-acetaminophen 5-325 MG tablet Commonly known as: PERCOCET/ROXICET       TAKE these medications  acetaminophen 325 MG tablet Commonly known as: TYLENOL Take 1-2 tablets (325-650 mg total) by mouth every 4 (four) hours as needed for mild pain.   alendronate 70 MG tablet Commonly known as: FOSAMAX Take 70 mg by mouth once a week. Sunday   ammonium lactate 12 % lotion Commonly known as: LAC-HYDRIN Apply topically 2 (two) times daily. What changed:  how much to take when to take this   apixaban 2.5 MG Tabs tablet Commonly known as: ELIQUIS Take 1 tablet (2.5 mg total) by mouth 2 (two) times  daily for 23 days.   ascorbic acid 500 MG tablet Commonly known as: VITAMIN C Take 500 mg by mouth daily.   calcium-vitamin D 250-100 MG-UNIT tablet Take 1 tablet by mouth daily.   Centrum Silver 50+Women Tabs Take 1 tablet by mouth daily.   Co Q 10 100 MG Caps Take 100 mg by mouth at bedtime.   diclofenac Sodium 1 % Gel Commonly known as: VOLTAREN Apply 2 g topically 4 (four) times daily. What changed: when to take this   diltiazem 180 MG 24 hr capsule Commonly known as: CARDIZEM CD Take 1 capsule (180 mg total) by mouth daily.   diphenoxylate-atropine 2.5-0.025 MG tablet Commonly known as: LOMOTIL Take 1 tablet by mouth 2 (two) times daily as needed for diarrhea or loose stools.   DULoxetine 60 MG capsule Commonly known as: CYMBALTA Take 1 capsule (60 mg total) by mouth daily.   fish oil-omega-3 fatty acids 1000 MG capsule Take 2 g by mouth daily.   furosemide 40 MG tablet Commonly known as: LASIX Take 1 tablet (40 mg total) by mouth daily.   gabapentin 100 MG capsule Commonly known as: NEURONTIN Take 1 capsule (100 mg total) by mouth at bedtime.   guaiFENesin 200 MG tablet Take 1 tablet (200 mg total) by mouth every 4 (four) hours as needed for cough or to loosen phlegm. What changed: when to take this   hydrocerin Crea Apply 1 Application topically 2 (two) times daily.   insulin lispro 100 UNIT/ML injection Commonly known as: HUMALOG Inject 4-8 Units into the skin 3 (three) times daily before meals.   Lantus SoloStar 100 UNIT/ML Solostar Pen Generic drug: insulin glargine Inject 9 Units into the skin at bedtime. What changed: how much to take   latanoprost 0.005 % ophthalmic solution Commonly known as: XALATAN Place 1 drop into the left eye at bedtime.   losartan 25 MG tablet Commonly known as: COZAAR Take 1 tablet (25 mg total) by mouth daily.   meclizine 25 MG tablet Commonly known as: ANTIVERT Take 1 tablet (25 mg total) by mouth 3 (three)  times daily as needed for dizziness.   melatonin 5 MG Tabs Take 1 tablet (5 mg total) by mouth at bedtime as needed. What changed: reasons to take this   methocarbamol 500 MG tablet Commonly known as: ROBAXIN Take 1 tablet (500 mg total) by mouth every 6 (six) hours as needed for muscle spasms.   metoprolol succinate 100 MG 24 hr tablet Commonly known as: TOPROL-XL Take 1 tablet (100 mg total) by mouth daily. Take with or immediately following a meal.   Myrbetriq 50 MG Tb24 tablet Generic drug: mirabegron ER Take 50 mg by mouth daily.   nortriptyline 10 MG capsule Commonly known as: PAMELOR Take 1 capsule (10 mg total) by mouth at bedtime.   OneTouch Ultra test strip Generic drug: glucose blood 1 each 4 (four) times daily.   pravastatin 40 MG  tablet Commonly known as: PRAVACHOL Take 40 mg by mouth daily.   PROBIOTIC PO Take by mouth daily.   RELION INSULIN SYR 0.3ML/31G 31G X 5/16" 0.3 ML Misc Generic drug: Insulin Syringe-Needle U-100 USE 4 SYRINGES DAILY   spironolactone 25 MG tablet Commonly known as: ALDACTONE Take 0.5 tablets (12.5 mg total) by mouth daily.   STOOL SOFTENER PO Take 100 mg by mouth at bedtime as needed (for constipation).   TechLite Pen Needles 31G X 8 MM Misc Generic drug: Insulin Pen Needle Use daily at 6 (six) AM.   traMADol 50 MG tablet Commonly known as: ULTRAM Take 1-2 tablets (50-100 mg total) by mouth every 6 (six) hours as needed for moderate pain or severe pain. What changed:  how much to take when to take this reasons to take this       Allergies  Allergen Reactions   Codeine Anxiety   Tape Rash    Follow-up Information     Haddix, Gillie Manners, MD. Schedule an appointment as soon as possible for a visit in 2 week(s).   Specialty: Orthopedic Surgery Why: for wound check and repeat x-rays Contact information: [address] [place] [address] [phone]                  The results of significant  diagnostics from this hospitalization (including imaging, microbiology, ancillary and laboratory) are listed below for reference.    Significant Diagnostic Studies: DG Ankle Complete Left  Result Date: 03/15/2023 CLINICAL DATA:  Fracture EXAM: LEFT ANKLE COMPLETE - 3+ VIEW COMPARISON:  Left ankle x-ray 03/15/2023 FINDINGS: There is a new lateral fibular sideplate and screws fixating a distal fibular fracture. There are 2 new screws fixating medial malleolar fracture. Alignment is anatomic. Bones are osteopenic. There is soft tissue swelling surrounding the ankle. IMPRESSION: Status post ORIF of distal fibular and medial malleolar fractures. Alignment is anatomic. Electronically Signed   By: Darliss Cheney M.D.   On: 03/15/2023 16:53   DG Knee Right Port  Result Date: 03/15/2023 CLINICAL DATA:  Postop fracture EXAM: PORTABLE RIGHT KNEE - 2 VIEW COMPARISON:  Knee CT dated March 14, 2023 FINDINGS: Interval postsurgical changes from internal fixation periprosthetic fracture of the distal right femur. Hardware components appear in their expected alignment. Prior right total knee replacement. Expected postoperative changes within the overlying soft tissues. IMPRESSION: Interval postsurgical changes from internal fixation of periprosthetic fracture of the distal right femur. Electronically Signed   By: Allegra Lai M.D.   On: 03/15/2023 16:34   DG Ankle Complete Left  Result Date: 03/15/2023 CLINICAL DATA:  [id number] Elective surgery [id number] EXAM: LEFT ANKLE COMPLETE - 3+ VIEW COMPARISON:  Radiograph 03/15/2023 FINDINGS: Intraoperative images during distal fibular plate fixation, syndesmotic fixation, and medial malleolar fixation. Improved fracture alignment. IMPRESSION: Intraoperative images during ankle fracture ORIF with syndesmotic fixation. Improved fracture alignment. Electronically Signed   By: Caprice Renshaw M.D.   On: 03/15/2023 15:21   DG FEMUR, MIN 2 VIEWS RIGHT  Result Date: 03/15/2023 CLINICAL  DATA:  [id number] Elective surgery [id number] EXAM: RIGHT FEMUR 2 VIEWS COMPARISON:  CT 03/14/2023 FINDINGS: Intraoperative images during plate fixation of the femur for a distal femur periprosthetic fracture. IMPRESSION: Intraoperative images during distal femur ORIF. Electronically Signed   By: Caprice Renshaw M.D.   On: 03/15/2023 15:19   DG C-Arm 1-60 Min-No Report  Result Date: 03/15/2023 Fluoroscopy was utilized by the requesting physician.  No radiographic interpretation.   DG C-Arm 1-60 Min-No Report  Result Date: 03/15/2023 Fluoroscopy was utilized by the requesting physician.  No radiographic interpretation.   DG Ankle Complete Left  Result Date: 03/15/2023 CLINICAL DATA:  Post reduction/splinting. EXAM: LEFT ANKLE COMPLETE - 3+ VIEW COMPARISON:  03/14/2023 FINDINGS: The left ankle has been reduced and placed within a splint. Again noted are fractures involving the distal fibula and tibia. Bone detail is limited due to the splint. Ankle alignment has slightly improved but there appears to be some posterior subluxation of the ankle joint and abnormal alignment at the tibiotalar joint on the frontal view. IMPRESSION: 1. Alignment of the left ankle has improved but there is concern for residual posterior subluxation and abnormal alignment. 2. Fractures involving the distal tibia and fibula. Electronically Signed   By: Richarda Overlie M.D.   On: 03/15/2023 07:34   CT Ankle Left Wo Contrast  Result Date: 03/14/2023 CLINICAL DATA:  Ankle trauma, fracture, xray done (Age >= 5y) EXAM: CT OF THE LEFT ANKLE WITHOUT CONTRAST TECHNIQUE: Multidetector CT imaging of the left ankle was performed according to the standard protocol. Multiplanar CT image reconstructions were also generated. RADIATION DOSE REDUCTION: This exam was performed according to the departmental dose-optimization program which includes automated exposure control, adjustment of the mA and/or kV according to patient size and/or use of iterative  reconstruction technique. COMPARISON:  X-ray left ankle 03/14/2023 FINDINGS: Bones/Joint/Cartilage Acute comminuted and displaced left medial malleolar fracture. Acute 1/4 shaft width laterally displaced transsyndesmotic distal left fibular fracture. Acute displaced posterior malleolar fracture (10:99). Possible infra syndesmotic distal left fibular avulsion fracture (11:86). Posterior dislocation of the talar dome in relation to the tibial plateau of the left ankle. The posterior malleolus impacted onto the talar dome. Ligaments Suboptimally assessed by CT. Muscles and Tendons Grossly unremarkable. Soft tissues Subcutaneus soft tissue edema.  Varicose veins. Inadvertently included coronal in sagittal views of the right ankle with no acute fracture dislocation identified. Diffusely decreased bone density. Degenerative changes of the midfoot. IMPRESSION: 1. Acute trimalleolar left ankle fracture. 2. Posterior dislocation of the talar dome in relation to the tibial plateau of the left ankle. The posterior malleolus is impacted onto the talar dome. 3. Possible infra syndesmotic distal left fibular avulsion fracture 4. Inadvertently included coronal in sagittal views of the RIGHT ankle with no acute fracture dislocation identified. Diffusely decreased bone density. Degenerative changes of the midfoot. Electronically Signed   By: Tish Frederickson M.D.   On: 03/14/2023 23:35   CT Knee Right Wo Contrast  Result Date: 03/14/2023 CLINICAL DATA:  Knee trauma, occult fracture suspected, xray done EXAM: CT OF THE RIGHT KNEE WITHOUT CONTRAST TECHNIQUE: Multidetector CT imaging of the right knee was performed according to the standard protocol. Multiplanar CT image reconstructions were also generated. RADIATION DOSE REDUCTION: This exam was performed according to the departmental dose-optimization program which includes automated exposure control, adjustment of the mA and/or kV according to patient size and/or use of iterative  reconstruction technique. COMPARISON:  X-ray right knee 03/14/2023 FINDINGS: Bones/Joint/Cartilage Total right knee arthroplasty. Acute minimally displaced fracture of the distal femoral diaphysis and metaphysis extending to the level of the arthroplasty. No other acute displaced fracture identified. Limited evaluation due to streak artifact originating from the surgical hardware. No Peri prosthetic lucency noted. Small joint effusion. Ligaments Suboptimally assessed by CT. Muscles and Tendons Subcutaneus soft tissue edema and possible underlying hematoma of the anterior musculature at the level of the prosthesis (3:41). Soft tissues Varicose veins.  Arterial calcifications. IMPRESSION: 1. Acute minimally displaced fracture of  the distal femoral diaphysis extending to the level of the total right knee arthroplasty. 2. Associated small joint effusion. 3. Subcutaneus soft tissue edema and possible underlying hematoma of the anterior musculature at the level of the prosthesis. Electronically Signed   By: Tish Frederickson M.D.   On: 03/14/2023 23:05   DG Ankle Complete Left  Result Date: 03/14/2023 CLINICAL DATA:  Left ankle pain. EXAM: LEFT ANKLE COMPLETE - 3+ VIEW COMPARISON:  None Available. FINDINGS: Osteopenia is noted. There is an acute oblique fracture of the distal fibula with mild lateral displacement of the distal fracture fragment. There is a nondisplaced fracture of the medial malleolus. There is anterior dislocation of the distal tibia at the tibiotalar joint. Vascular calcifications are noted in the soft tissues. IMPRESSION: 1. Fractures of the distal fibula and medial malleolus. 2. Anterior dislocation of the tibia at the tibiotalar joint. Electronically Signed   By: Thornell Sartorius M.D.   On: 03/14/2023 20:48   DG Foot Complete Left  Result Date: 03/14/2023 CLINICAL DATA:  Knee pain EXAM: LEFT FOOT - COMPLETE 3+ VIEW COMPARISON:  None Available. FINDINGS: Osteopenia. Fractures of the distal left  fibula, incompletely imaged on these radiographs of the foot. The ankle mortise appears displaced. Slightly angulated fracture of the neck of the left fifth metatarsal obscured by overlying blanket material. Joint spaces generally preserved. Diffuse soft tissue edema. IMPRESSION: 1. Fractures of the distal left fibula, incompletely imaged on these radiographs of the foot. 2. The ankle mortise appears displaced. 3. Slightly angulated fracture of the neck of the left fifth metatarsal obscured by overlying blanket material. 4. Osteopenia. 5. Diffuse soft tissue edema. Electronically Signed   By: Jearld Lesch M.D.   On: 03/14/2023 18:59   DG Knee Complete 4 Views Right  Result Date: 03/14/2023 CLINICAL DATA:  Knee pain EXAM: RIGHT KNEE - COMPLETE 4+ VIEW COMPARISON:  None Available. FINDINGS: The bones are osteopenic. There is a right knee total arthroplasty in anatomic alignment. There is an acute oblique nondisplaced fracture through the distal femoral diaphysis extending to the level of the knee arthroplasty. There surrounding soft tissue swelling. IMPRESSION: Acute nondisplaced oblique fracture through the distal femoral diaphysis extending to the level of the knee arthroplasty. Electronically Signed   By: Darliss Cheney M.D.   On: 03/14/2023 18:53    Microbiology: Recent Results (from the past 240 hour(s))  Surgical pcr screen     Status: None   Collection Time: 03/15/23  9:01 AM   Specimen: Nasal Mucosa; Nasal Swab  Result Value Ref Range Status   MRSA, PCR NEGATIVE NEGATIVE Final   Staphylococcus aureus NEGATIVE NEGATIVE Final    Comment: Performed at [facility], [address], [place], [place] [address]     Labs: Basic Metabolic Panel: Recent Labs  Lab 03/16/23 [id number] 03/17/23 0154 03/18/23 0448 03/22/23 0627 03/22/23 0629  NA 131* 133* 137  --  139  K 4.9 5.0 3.6  --  3.8  CL 99 97* 104  --  101  CO2 25 27 26   --  28  GLUCOSE 477* 122* 133*  --  136*  BUN 37* 45* 27*  --   11  CREATININE 1.27* 1.32* 0.77 0.58 0.54  CALCIUM 8.7* 9.2 8.7*  --  8.8*  MG  --   --  1.9 1.8  --   PHOS  --   --   --   --  3.8   Liver Function Tests: Recent Labs  Lab  03/17/23 0154 03/22/23 0629  AST 24  --   ALT 10  --   ALKPHOS 86  --   BILITOT 0.3  --   PROT 5.9*  --   ALBUMIN 3.0* 2.5*   No results for input(s): "LIPASE", "AMYLASE" in the last 168 hours. No results for input(s): "AMMONIA" in the last 168 hours. CBC: Recent Labs  Lab 03/16/23 0452 03/17/23 0154 03/18/23 0448 03/22/23 0627  WBC 11.9* 12.2* 8.3 7.7  NEUTROABS  --  8.2*  --  4.5  HGB 12.0 11.2* 11.7* 11.4*  HCT 37.3 36.0 37.2 36.5  MCV 91.0 92.5 91.9 93.4  PLT 147* 163 148* 261   Cardiac Enzymes: No results for input(s): "CKTOTAL", "CKMB", "CKMBINDEX", "TROPONINI" in the last 168 hours. BNP: BNP (last 3 results) Recent Labs    11/26/22 1327  BNP 548.1*    ProBNP (last 3 results) No results for input(s): "PROBNP" in the last 8760 hours.  CBG: Recent Labs  Lab 03/21/23 0734 03/21/23 1126 03/21/23 1616 03/21/23 2117 03/22/23 0717  GLUCAP 150* 208* 149* 114* 137*       Signed:  Silvano Bilis MD.  [facility] 03/22/2023, 10:56 AM

## 2023-03-22 NOTE — Plan of Care (Signed)
AVS Reviewed.  Report Called.  Patient anxious for discharge.  All Questions answered.

## 2023-03-22 NOTE — Care Management Important Message (Signed)
Important Message  Patient Details  Name: Cambri Baller MRN: [id number] Date of Birth: [birth year]   Medicare Important Message Given:  Yes     Sherilyn Banker 03/22/2023, 1:34 PM

## 2023-03-22 NOTE — TOC Progression Note (Addendum)
Transition of Care [facility]) - Progression Note    Patient Details  Name: [name] MRN: [id number] Date of Birth: 07-20-1951  [facility]) CM/SW Contact  Lorri Frederick, LCSW Phone Number: 03/22/2023, 8:41 AM  Clinical Narrative:   SNF auth approved: [id number], 3 days: 5/6-5/8.  CSW confirmed with [facility] that they can receive pt today.   Expected Discharge Plan: IP Rehab Facility Barriers to Discharge: Continued Medical Work up  Expected Discharge Plan and Services   Discharge Planning Services: CM Consult   Living arrangements for the past 2 months: Single Family Home                                       Social Determinants of Health (SDOH) Interventions SDOH Screenings   Food Insecurity: No Food Insecurity (12/01/2022)  Housing: Low Risk  (12/01/2022)  Transportation Needs: No Transportation Needs (12/01/2022)  Utilities: Not At Risk (12/01/2022)  Tobacco Use: Low Risk  (03/16/2023)    Readmission Risk Interventions    12/04/2022   12:42 PM  Readmission Risk Prevention Plan  Transportation Screening Complete  PCP or Specialist Appt within 5-7 Days Complete  Home Care Screening Complete  Medication Review (RN CM) Complete

## 2023-03-22 NOTE — TOC Transition Note (Signed)
Transition of Care [facility]) - CM/SW Discharge Note   Patient Details  Name: [name] MRN: [id number] Date of Birth: 09-23-1951  Transition of Care [facility]) CM/SW Contact:  Lorri Frederick, LCSW Phone Number: 03/22/2023, 11:59 AM   Clinical Narrative:   Pt discharging to [facility], room 505P.  RN call report to [phone]. (Main number: [phone])    Final next level of care: Skilled Nursing Facility Barriers to Discharge: Barriers Resolved   Patient Goals and CMS Choice      Discharge Placement                Patient chooses bed at: [facility] Patient to be transferred to facility by: [facility] Name of family member notified: daughter Victorino Dike Patient and family notified of of transfer: 03/22/23  Discharge Plan and Services Additional resources added to the After Visit Summary for     Discharge Planning Services: CM Consult                                 Social Determinants of Health (SDOH) Interventions SDOH Screenings   Food Insecurity: No Food Insecurity (12/01/2022)  Housing: Low Risk  (12/01/2022)  Transportation Needs: No Transportation Needs (12/01/2022)  Utilities: Not At Risk (12/01/2022)  Tobacco Use: Low Risk  (03/16/2023)     Readmission Risk Interventions    12/04/2022   12:42 PM  Readmission Risk Prevention Plan  Transportation Screening Complete  PCP or Specialist Appt within 5-7 Days Complete  Home Care Screening Complete  Medication Review (RN CM) Complete

## 2023-03-24 ENCOUNTER — Ambulatory Visit (HOSPITAL_BASED_OUTPATIENT_CLINIC_OR_DEPARTMENT_OTHER): Payer: Medicare Other | Admitting: Cardiology

## 2023-03-30 DIAGNOSIS — Z7689 Persons encountering health services in other specified circumstances: Secondary | ICD-10-CM | POA: Diagnosis not present

## 2023-03-30 DIAGNOSIS — H00015 Hordeolum externum left lower eyelid: Secondary | ICD-10-CM | POA: Diagnosis not present

## 2023-04-01 ENCOUNTER — Other Ambulatory Visit (HOSPITAL_COMMUNITY): Payer: Self-pay

## 2023-04-06 ENCOUNTER — Ambulatory Visit: Payer: Self-pay | Admitting: Student

## 2023-04-06 ENCOUNTER — Encounter (HOSPITAL_COMMUNITY): Payer: Self-pay | Admitting: Student

## 2023-04-06 ENCOUNTER — Other Ambulatory Visit: Payer: Self-pay

## 2023-04-06 DIAGNOSIS — S72401D Unspecified fracture of lower end of right femur, subsequent encounter for closed fracture with routine healing: Secondary | ICD-10-CM | POA: Diagnosis not present

## 2023-04-06 DIAGNOSIS — S82852D Displaced trimalleolar fracture of left lower leg, subsequent encounter for closed fracture with routine healing: Secondary | ICD-10-CM | POA: Diagnosis not present
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
## 2023-04-06 NOTE — Progress Notes (Addendum)
Pt is a resident at [facility]. I spoke with pt and reviewed her medical history. She is alert and able to speak for herself. I have faxed pre-op instructions to the facility nurse, Sabino Snipes, RN. Pt will be arriving via [facility] transportation. Husband and daughter will meet her here.

## 2023-04-06 NOTE — Progress Notes (Addendum)
Surgical Instructions    [name]'s procedure is scheduled on 04/07/23 at 9:38 AM.  Report to [facility] Main Entrance "A" at 7:10 AM, then check in with the Admitting office.  Call this number if you have problems the morning of surgery:  [phone]   If you have any questions prior to your surgery date call [phone]: Open Monday-Friday 8am-4pm If you experience any cold or flu symptoms such as cough, fever, chills, shortness of breath, etc. between now and your scheduled surgery, please notify us at the above number    [name] is to  not eat or drink after midnight tonight.    Take these medicines the morning of surgery with A SIP OF WATER: Diltiazem (Cardizem CD), Duloxetime (Cymbalta), Metoprolol (Toprol XL), Myrbetriq, Pravachol (Pravastatin), Tramadol - prn, Tylenol - prn  Please hold Melatonin tonight.          Do not wear jewelry or makeup. Do not wear lotions, powders, perfumes or deodorant. Do not shave 48 hours prior to surgery.   Do not bring valuables to the hospital. Do not wear nail polish, gel polish, artificial nails, or any other type of covering on natural nails (fingers and toes) If you have artificial nails or gel coating that need to be removed by a nail salon, please have this removed prior to surgery. Artificial nails or gel coating may interfere with anesthesia's ability to adequately monitor your vital signs.  Contacts, glasses, hearing aids, dentures or partials may not be worn into surgery, please bring cases for these belongings   THE NIGHT BEFORE SURGERY, give 80% of Lantus Insulin (Insulin Glargine) tonight.   THE MORNING OF SURGERY, do not give Humalog Insulin in AM.    Check your blood sugar the morning of your surgery when you wake up and every 2 hours until you get to the Short Stay unit.  If your blood sugar is less than 70 mg/dL, you will need to treat for low blood sugar: Do not take insulin. Treat a low blood sugar (less than 70  mg/dL) with  cup of clear juice (cranberry or apple), 4 glucose tablets, OR glucose gel. Recheck blood sugar in 15 minutes after treatment (to make sure it is greater than 70 mg/dL). If your blood sugar is not greater than 70 mg/dL on recheck, call [phone] for further instructions.

## 2023-04-07 ENCOUNTER — Ambulatory Visit (HOSPITAL_BASED_OUTPATIENT_CLINIC_OR_DEPARTMENT_OTHER): Payer: Medicare Other | Admitting: Anesthesiology

## 2023-04-07 ENCOUNTER — Ambulatory Visit (HOSPITAL_COMMUNITY): Payer: Medicare Other | Admitting: Anesthesiology

## 2023-04-07 ENCOUNTER — Other Ambulatory Visit: Payer: Self-pay

## 2023-04-07 ENCOUNTER — Ambulatory Visit (HOSPITAL_COMMUNITY)
Admission: RE | Admit: 2023-04-07 | Discharge: 2023-04-07 | Disposition: A | Discharge status: Skilled Nursing Facility | Payer: Medicare Other | Attending: Student | Admitting: Student

## 2023-04-07 ENCOUNTER — Encounter (HOSPITAL_COMMUNITY): Payer: Self-pay | Admitting: Student

## 2023-04-07 ENCOUNTER — Encounter (HOSPITAL_COMMUNITY)
Admission: RE | Disposition: A | Discharge status: Skilled Nursing Facility | Payer: Self-pay | Source: Home / Self Care | Attending: Student

## 2023-04-07 ENCOUNTER — Ambulatory Visit (HOSPITAL_COMMUNITY): Payer: Medicare Other

## 2023-04-07 DIAGNOSIS — T84028A Dislocation of other internal joint prosthesis, initial encounter: Secondary | ICD-10-CM

## 2023-04-07 DIAGNOSIS — K219 Gastro-esophageal reflux disease without esophagitis: Secondary | ICD-10-CM | POA: Insufficient documentation

## 2023-04-07 DIAGNOSIS — M9711XD Periprosthetic fracture around internal prosthetic right knee joint, subsequent encounter: Secondary | ICD-10-CM | POA: Diagnosis not present

## 2023-04-07 DIAGNOSIS — E669 Obesity, unspecified: Secondary | ICD-10-CM | POA: Insufficient documentation

## 2023-04-07 DIAGNOSIS — I110 Hypertensive heart disease with heart failure: Secondary | ICD-10-CM

## 2023-04-07 DIAGNOSIS — W19XXXD Unspecified fall, subsequent encounter: Secondary | ICD-10-CM | POA: Diagnosis not present

## 2023-04-07 DIAGNOSIS — Z6829 Body mass index (BMI) 29.0-29.9, adult: Secondary | ICD-10-CM | POA: Diagnosis not present

## 2023-04-07 DIAGNOSIS — I509 Heart failure, unspecified: Secondary | ICD-10-CM | POA: Diagnosis not present

## 2023-04-07 DIAGNOSIS — T84018A Broken internal joint prosthesis, other site, initial encounter: Secondary | ICD-10-CM

## 2023-04-07 DIAGNOSIS — I482 Chronic atrial fibrillation, unspecified: Secondary | ICD-10-CM | POA: Insufficient documentation

## 2023-04-07 DIAGNOSIS — S82852D Displaced trimalleolar fracture of left lower leg, subsequent encounter for closed fracture with routine healing: Secondary | ICD-10-CM | POA: Insufficient documentation

## 2023-04-07 DIAGNOSIS — Z7901 Long term (current) use of anticoagulants: Secondary | ICD-10-CM | POA: Insufficient documentation

## 2023-04-07 DIAGNOSIS — I4891 Unspecified atrial fibrillation: Secondary | ICD-10-CM | POA: Insufficient documentation

## 2023-04-07 DIAGNOSIS — T84197A Other mechanical complication of internal fixation device of bone of left lower leg, initial encounter: Secondary | ICD-10-CM | POA: Diagnosis not present

## 2023-04-07 DIAGNOSIS — W19XXXA Unspecified fall, initial encounter: Secondary | ICD-10-CM | POA: Diagnosis not present

## 2023-04-07 DIAGNOSIS — E109 Type 1 diabetes mellitus without complications: Secondary | ICD-10-CM | POA: Insufficient documentation

## 2023-04-07 DIAGNOSIS — E785 Hyperlipidemia, unspecified: Secondary | ICD-10-CM | POA: Insufficient documentation

## 2023-04-07 DIAGNOSIS — G8918 Other acute postprocedural pain: Secondary | ICD-10-CM | POA: Diagnosis not present

## 2023-04-07 DIAGNOSIS — Z794 Long term (current) use of insulin: Secondary | ICD-10-CM | POA: Insufficient documentation

## 2023-04-07 DIAGNOSIS — S82852A Displaced trimalleolar fracture of left lower leg, initial encounter for closed fracture: Secondary | ICD-10-CM | POA: Diagnosis not present

## 2023-04-07 DIAGNOSIS — Z9889 Other specified postprocedural states: Secondary | ICD-10-CM | POA: Diagnosis not present

## 2023-04-07 HISTORY — DX: Anxiety disorder, unspecified: F41.9

## 2023-04-07 HISTORY — PX: ORIF ANKLE FRACTURE: SHX5408

## 2023-04-07 HISTORY — DX: Personal history of urinary calculi: Z87.442

## 2023-04-07 HISTORY — DX: Pneumonia, unspecified organism: J18.9

## 2023-04-07 HISTORY — DX: Unspecified atrial fibrillation: I48.91

## 2023-04-07 LAB — CBC
HCT: 48.5 % — ABNORMAL HIGH (ref 36.0–46.0)
Hemoglobin: 15.3 g/dL — ABNORMAL HIGH (ref 12.0–15.0)
MCH: 29.3 pg (ref 26.0–34.0)
MCHC: 31.5 g/dL (ref 30.0–36.0)
MCV: 92.7 fL (ref 80.0–100.0)
Platelets: 373 10*3/uL (ref 150–400)
RBC: 5.23 MIL/uL — ABNORMAL HIGH (ref 3.87–5.11)
RDW: 15.4 % (ref 11.5–15.5)
WBC: 7.9 10*3/uL (ref 4.0–10.5)
nRBC: 0 % (ref 0.0–0.2)

## 2023-04-07 LAB — BASIC METABOLIC PANEL
Anion gap: 12 (ref 5–15)
BUN: 19 mg/dL (ref 8–23)
CO2: 25 mmol/L (ref 22–32)
Calcium: 9.6 mg/dL (ref 8.9–10.3)
Chloride: 98 mmol/L (ref 98–111)
Creatinine, Ser: 0.71 mg/dL (ref 0.44–1.00)
GFR, Estimated: >60 mL/min (ref >60)
Glucose, Bld: 164 mg/dL — ABNORMAL HIGH (ref 70–99)
Potassium: 3.7 mmol/L (ref 3.5–5.1)
Sodium: 135 mmol/L (ref 135–145)

## 2023-04-07 LAB — GLUCOSE, CAPILLARY
Glucose-Capillary: 164 mg/dL — ABNORMAL HIGH (ref 70–99)
Glucose-Capillary: 199 mg/dL — ABNORMAL HIGH (ref 70–99)

## 2023-04-07 SURGERY — OPEN REDUCTION INTERNAL FIXATION (ORIF) ANKLE FRACTURE
Anesthesia: General | Site: Ankle | Laterality: Left

## 2023-04-07 MED ORDER — LIDOCAINE 2% (20 MG/ML) 5 ML SYRINGE
INTRAMUSCULAR | Status: AC
  Filled 2023-04-07: qty 5

## 2023-04-07 MED ORDER — ROCURONIUM BROMIDE 10 MG/ML (PF) SYRINGE
PREFILLED_SYRINGE | INTRAVENOUS | Status: DC | PRN
  Administered 2023-04-07: 50 mg via INTRAVENOUS

## 2023-04-07 MED ORDER — CEFAZOLIN SODIUM-DEXTROSE 2-4 GM/100ML-% IV SOLN
2.0000 g | INTRAVENOUS | Status: AC
  Administered 2023-04-07: 2 g via INTRAVENOUS
  Filled 2023-04-07: qty 100

## 2023-04-07 MED ORDER — CLONIDINE HCL (ANALGESIA) 100 MCG/ML EP SOLN
EPIDURAL | Status: DC | PRN
  Administered 2023-04-07: 50 ug
  Administered 2023-04-07: 30 ug

## 2023-04-07 MED ORDER — ACETAMINOPHEN 10 MG/ML IV SOLN
INTRAVENOUS | Status: DC | PRN
  Administered 2023-04-07: 1000 mg via INTRAVENOUS

## 2023-04-07 MED ORDER — FENTANYL CITRATE (PF) 250 MCG/5ML IJ SOLN
INTRAMUSCULAR | Status: AC
  Filled 2023-04-07: qty 5

## 2023-04-07 MED ORDER — FENTANYL CITRATE (PF) 250 MCG/5ML IJ SOLN
INTRAMUSCULAR | Status: DC | PRN
  Administered 2023-04-07: 100 ug via INTRAVENOUS

## 2023-04-07 MED ORDER — ONDANSETRON HCL 4 MG/2ML IJ SOLN
INTRAMUSCULAR | Status: AC
  Filled 2023-04-07: qty 2

## 2023-04-07 MED ORDER — DEXAMETHASONE SODIUM PHOSPHATE 4 MG/ML IJ SOLN
INTRAMUSCULAR | Status: DC | PRN
  Administered 2023-04-07: 3 mg via PERINEURAL
  Administered 2023-04-07: 2 mg via PERINEURAL

## 2023-04-07 MED ORDER — ROCURONIUM BROMIDE 10 MG/ML (PF) SYRINGE
PREFILLED_SYRINGE | INTRAVENOUS | Status: AC
  Filled 2023-04-07: qty 10

## 2023-04-07 MED ORDER — TOBRAMYCIN SULFATE 1.2 G IJ SOLR
INTRAMUSCULAR | Status: AC
  Filled 2023-04-07: qty 1.2

## 2023-04-07 MED ORDER — VANCOMYCIN HCL 1000 MG IV SOLR
INTRAVENOUS | Status: DC | PRN
  Administered 2023-04-07: 1000 mg via TOPICAL

## 2023-04-07 MED ORDER — PROPOFOL 10 MG/ML IV BOLUS
INTRAVENOUS | Status: DC | PRN
  Administered 2023-04-07: 30 mg via INTRAVENOUS
  Administered 2023-04-07: 90 mg via INTRAVENOUS
  Administered 2023-04-07: 30 mg via INTRAVENOUS

## 2023-04-07 MED ORDER — ACETAMINOPHEN 10 MG/ML IV SOLN
INTRAVENOUS | Status: AC
  Filled 2023-04-07: qty 100

## 2023-04-07 MED ORDER — ACETAMINOPHEN 500 MG PO TABS: 1000.0000 mg | ORAL_TABLET | Freq: Once | ORAL | Status: DC

## 2023-04-07 MED ORDER — FENTANYL CITRATE (PF) 100 MCG/2ML IJ SOLN: 25.0000 ug | INTRAMUSCULAR | Status: DC | PRN

## 2023-04-07 MED ORDER — ONDANSETRON HCL 4 MG/2ML IJ SOLN
INTRAMUSCULAR | Status: DC | PRN
  Administered 2023-04-07: 4 mg via INTRAVENOUS

## 2023-04-07 MED ORDER — FENTANYL CITRATE (PF) 100 MCG/2ML IJ SOLN: 50.0000 ug | Freq: Once | INTRAMUSCULAR | Status: AC

## 2023-04-07 MED ORDER — PHENYLEPHRINE HCL-NACL 20-0.9 MG/250ML-% IV SOLN
INTRAVENOUS | Status: DC | PRN
  Administered 2023-04-07: 40 ug/min via INTRAVENOUS

## 2023-04-07 MED ORDER — LACTATED RINGERS IV SOLN: INTRAVENOUS | Status: DC

## 2023-04-07 MED ORDER — FENTANYL CITRATE (PF) 100 MCG/2ML IJ SOLN
INTRAMUSCULAR | Status: AC
  Administered 2023-04-07: 50 ug
  Filled 2023-04-07: qty 2

## 2023-04-07 MED ORDER — PHENYLEPHRINE 80 MCG/ML (10ML) SYRINGE FOR IV PUSH (FOR BLOOD PRESSURE SUPPORT)
PREFILLED_SYRINGE | INTRAVENOUS | Status: DC | PRN
  Administered 2023-04-07 (×2): 80 ug via INTRAVENOUS
  Administered 2023-04-07: 120 ug via INTRAVENOUS
  Administered 2023-04-07: 80 ug via INTRAVENOUS

## 2023-04-07 MED ORDER — VANCOMYCIN HCL 1000 MG IV SOLR
INTRAVENOUS | Status: AC
  Filled 2023-04-07: qty 20

## 2023-04-07 MED ORDER — DEXAMETHASONE SODIUM PHOSPHATE 10 MG/ML IJ SOLN
INTRAMUSCULAR | Status: AC
  Filled 2023-04-07: qty 1

## 2023-04-07 MED ORDER — MEPERIDINE HCL 25 MG/ML IJ SOLN: 6.2500 mg | INTRAMUSCULAR | Status: DC | PRN

## 2023-04-07 MED ORDER — ORAL CARE MOUTH RINSE: 15.0000 mL | Freq: Once | OROMUCOSAL | Status: AC

## 2023-04-07 MED ORDER — SUGAMMADEX SODIUM 200 MG/2ML IV SOLN
INTRAVENOUS | Status: DC | PRN
  Administered 2023-04-07: 200 mg via INTRAVENOUS

## 2023-04-07 MED ORDER — AMISULPRIDE (ANTIEMETIC) 5 MG/2ML IV SOLN: 10.0000 mg | Freq: Once | INTRAVENOUS | Status: DC | PRN

## 2023-04-07 MED ORDER — TRAMADOL HCL 50 MG PO TABS: 50.0000 mg | ORAL_TABLET | Freq: Four times a day (QID) | ORAL | 0 refills | Status: DC | PRN

## 2023-04-07 MED ORDER — PHENYLEPHRINE 80 MCG/ML (10ML) SYRINGE FOR IV PUSH (FOR BLOOD PRESSURE SUPPORT)
PREFILLED_SYRINGE | INTRAVENOUS | Status: AC
  Filled 2023-04-07: qty 10

## 2023-04-07 MED ORDER — TOBRAMYCIN SULFATE 1.2 G IJ SOLR
INTRAMUSCULAR | Status: DC | PRN
  Administered 2023-04-07: 1.2 g via TOPICAL

## 2023-04-07 MED ORDER — DEXAMETHASONE SODIUM PHOSPHATE 10 MG/ML IJ SOLN
INTRAMUSCULAR | Status: DC | PRN
  Administered 2023-04-07: 5 mg via INTRAVENOUS

## 2023-04-07 MED ORDER — LIDOCAINE 2% (20 MG/ML) 5 ML SYRINGE
INTRAMUSCULAR | Status: DC | PRN
  Administered 2023-04-07: 80 mg via INTRAVENOUS

## 2023-04-07 MED ORDER — ROPIVACAINE HCL 5 MG/ML IJ SOLN
INTRAMUSCULAR | Status: DC | PRN
  Administered 2023-04-07: 15 mL via PERINEURAL
  Administered 2023-04-07: 25 mL via PERINEURAL

## 2023-04-07 MED ORDER — 0.9 % SODIUM CHLORIDE (POUR BTL) OPTIME
TOPICAL | Status: DC | PRN
  Administered 2023-04-07: 1000 mL

## 2023-04-07 MED ORDER — CHLORHEXIDINE GLUCONATE 0.12 % MT SOLN
15.0000 mL | Freq: Once | OROMUCOSAL | Status: AC
  Administered 2023-04-07: 15 mL via OROMUCOSAL
  Filled 2023-04-07: qty 15

## 2023-04-07 MED ORDER — MIDAZOLAM HCL 2 MG/2ML IJ SOLN
INTRAMUSCULAR | Status: AC
  Filled 2023-04-07: qty 2

## 2023-04-07 SURGICAL SUPPLY — 69 items
APL PRP STRL LF DISP 70% ISPRP (MISCELLANEOUS) ×1
BAG COUNTER SPONGE SURGICOUNT (BAG) ×2 IMPLANT
BAG SPNG CNTER NS LX DISP (BAG) ×1
BANDAGE ESMARK 6X9 LF (GAUZE/BANDAGES/DRESSINGS) ×2 IMPLANT
BIT DRILL LONG 4.0 (BIT) IMPLANT
BNDG CMPR 9X6 STRL LF SNTH (GAUZE/BANDAGES/DRESSINGS) ×1
BNDG CMPR MD 5X2 ELC HKLP STRL (GAUZE/BANDAGES/DRESSINGS) ×1
BNDG CMPR MED 10X6 ELC LF (GAUZE/BANDAGES/DRESSINGS) ×1
BNDG COHESIVE 4X5 TAN STRL (GAUZE/BANDAGES/DRESSINGS) ×2 IMPLANT
BNDG ELASTIC 2 VLCR STRL LF (GAUZE/BANDAGES/DRESSINGS) IMPLANT
BNDG ELASTIC 6X10 VLCR STRL LF (GAUZE/BANDAGES/DRESSINGS) IMPLANT
BNDG ESMARK 6X9 LF (GAUZE/BANDAGES/DRESSINGS) ×1
BRUSH SCRUB EZ PLAIN DRY (MISCELLANEOUS) ×4 IMPLANT
CHLORAPREP W/TINT 26 (MISCELLANEOUS) ×2 IMPLANT
COVER SURGICAL LIGHT HANDLE (MISCELLANEOUS) ×2 IMPLANT
CUFF TOURN SGL QUICK 34 (TOURNIQUET CUFF) ×1
CUFF TRNQT CYL 34X4X40X1 (TOURNIQUET CUFF) IMPLANT
DRAPE C-ARM 42X72 X-RAY (DRAPES) ×2 IMPLANT
DRAPE C-ARMOR (DRAPES) ×2 IMPLANT
DRAPE ORTHO SPLIT 77X108 STRL (DRAPES) ×2
DRAPE SURG ORHT 6 SPLT 77X108 (DRAPES) ×4 IMPLANT
DRAPE U-SHAPE 47X51 STRL (DRAPES) ×2 IMPLANT
DRILL BIT LONG 4.0 (BIT) ×2
DRSG MEPITEL 4X7.2 (GAUZE/BANDAGES/DRESSINGS) IMPLANT
ELECT REM PT RETURN 9FT ADLT (ELECTROSURGICAL) ×1
ELECTRODE REM PT RTRN 9FT ADLT (ELECTROSURGICAL) ×2 IMPLANT
GAUZE SPONGE 4X4 12PLY STRL (GAUZE/BANDAGES/DRESSINGS) IMPLANT
GLOVE BIO SURGEON STRL SZ 6.5 (GLOVE) ×6 IMPLANT
GLOVE BIO SURGEON STRL SZ7.5 (GLOVE) ×6 IMPLANT
GLOVE BIOGEL PI IND STRL 6.5 (GLOVE) ×2 IMPLANT
GLOVE BIOGEL PI IND STRL 7.5 (GLOVE) ×2 IMPLANT
GOWN STRL REUS W/ TWL LRG LVL3 (GOWN DISPOSABLE) ×4 IMPLANT
GOWN STRL REUS W/TWL LRG LVL3 (GOWN DISPOSABLE) ×2
GUIDE PIN 3.2X343 (PIN) ×2
GUIDE PIN 3.2X343MM (PIN) ×2
GUIDE ROD 3.0 (MISCELLANEOUS) ×1
KIT TURNOVER KIT B (KITS) ×2 IMPLANT
MANIFOLD NEPTUNE II (INSTRUMENTS) ×2 IMPLANT
NAIL IM CANN TRI 10X160 LT (Nail) IMPLANT
NDL HYPO 25GX1X1/2 BEV (NEEDLE) ×2 IMPLANT
NEEDLE HYPO 25GX1X1/2 BEV (NEEDLE) ×1 IMPLANT
NS IRRIG 1000ML POUR BTL (IV SOLUTION) ×2 IMPLANT
PACK TOTAL JOINT (CUSTOM PROCEDURE TRAY) ×2 IMPLANT
PAD ABD 8X10 STRL (GAUZE/BANDAGES/DRESSINGS) IMPLANT
PAD ARMBOARD 7.5X6 YLW CONV (MISCELLANEOUS) ×4 IMPLANT
PAD CAST 4YDX4 CTTN HI CHSV (CAST SUPPLIES) IMPLANT
PADDING CAST COTTON 4X4 STRL (CAST SUPPLIES) ×1
PADDING CAST COTTON 6X4 STRL (CAST SUPPLIES) IMPLANT
PIN GUIDE 3.2X343MM (PIN) IMPLANT
ROD GUIDE 3.0 (MISCELLANEOUS) IMPLANT
SCREW TRIGEN LOW PROF 5.0X25 (Screw) IMPLANT
SCREW TRIGEN LOW PROF 5.0X37.5 (Screw) IMPLANT
SCREW TRIGEN LOW PROF 5.0X85 (Screw) IMPLANT
SPLINT PLASTER CAST XFAST 5X30 (CAST SUPPLIES) IMPLANT
STAPLER VISISTAT 35W (STAPLE) ×2 IMPLANT
SUCTION FRAZIER HANDLE 10FR (MISCELLANEOUS) ×1
SUCTION FRAZIER HANDLE 12FR (TUBING) ×1
SUCTION TUBE FRAZIER 10FR DISP (MISCELLANEOUS) ×2 IMPLANT
SUCTION TUBE FRAZIER 12FR DISP (TUBING) IMPLANT
SUT ETHILON 3 0 PS 1 (SUTURE) ×4 IMPLANT
SUT VIC AB 0 CT1 27 (SUTURE) ×1
SUT VIC AB 0 CT1 27XBRD ANBCTR (SUTURE) ×2 IMPLANT
SUT VIC AB 2-0 CT1 27 (SUTURE) ×2
SUT VIC AB 2-0 CT1 TAPERPNT 27 (SUTURE) ×4 IMPLANT
SYR CONTROL 10ML LL (SYRINGE) ×2 IMPLANT
TOWEL GREEN STERILE (TOWEL DISPOSABLE) ×4 IMPLANT
TOWEL GREEN STERILE FF (TOWEL DISPOSABLE) ×2 IMPLANT
UNDERPAD 30X36 HEAVY ABSORB (UNDERPADS AND DIAPERS) ×2 IMPLANT
WATER STERILE IRR 1000ML POUR (IV SOLUTION) ×2 IMPLANT

## 2023-04-07 NOTE — Op Note (Signed)
Orthopaedic Surgery Operative Note (CSN: [id number] ) Date of Surgery: 04/07/2023  Admit Date: 04/07/2023   Diagnoses: Pre-Op Diagnoses: Left trimalleolar ankle fracture/dislocation with hardware failure and recurrent dislocation Exposed orthopaedic hardware  Post-Op Diagnosis: Same  Procedures: CPT 27870-Ankle fusion with hindfoot fusion nail CPT 20680-Removal of hardware left ankle  Surgeons : Primary: Roby Lofts, MD  Assistant: Ulyses Southward, PA-C  Location: OR 3   Anesthesia: General with regional   Antibiotics: Ancef 2g preop with 1gm vancomycin powder and 1.2 gm tobramycin powder placed topically   Tourniquet time: None    Estimated Blood Loss: 100 mL  Complications:None  Specimens:None   Implants: Implant Name Type Inv. Item Serial No. Manufacturer Lot No. LRB No. Used Action  NAIL IM CANN TRI [id number] LT - [id number] Nail NAIL IM CANN TRI [id number] LT  [facility] [employer] [id number] Left 1 Implanted  SCREW TRIGEN LOW PROF 5.0X85 - [id number] Screw SCREW TRIGEN LOW PROF 5.0X85  [facility] [employer] [id number] Left 1 Implanted  SCREW TRIGEN LOW PROF 5.0X85 - [id number] Screw SCREW TRIGEN LOW PROF 5.0X85  [employer] [id number] Left 1 Implanted  SCREW TRIGEN LOW PROF 5.0X37.5 - [id number] Screw SCREW TRIGEN LOW PROF 5.0X37.5  [employer] [id number] Left 1 Implanted  SCREW TRIGEN LOW PROF 5.0X25 - [id number] Screw SCREW TRIGEN LOW PROF 5.0X25  [employer] [id number] Left 1 Implanted     Indications for Surgery: 72 year old female who sustained a right distal femur fracture and a left trimalleolar ankle fracture who underwent open reduction internal fixation of her left ankle fracture on 03/15/2023.  Unfortunately she returns for first postoperative visit with hardware failure with a recurrent dislocation of her ankle.  She also had exposed hardware through her lateral wounds.  Due to the exposed hardware as  well as the recurrent dislocation I felt that she was indicated for revision fixation.  I discussed with her multiple options including revision with open reduction internal fixation however I felt that this would likely have a high recurrence of repeat failure due to her bone quality and the unstable nature of her ankle joint.  I also discussed the possibility of external fixation with percutaneous pinning of her tibiotalar joint.  I felt that this also had a high risk of failure and a high risk of complication rate secondary to her neuropathy in her lower extremity.  I recommended proceeding with hindfoot fusion nail with internal stabilization with removal of hardware.  Risks and benefits were discussed with the patient and her husband.  Risks include but not limited to bleeding, infection, malunion, nonunion, hardware failure, hardware irritation, nerve or blood vessel injury, posttraumatic arthritis, need for definitive fusion, need for hardware removal, even the possibility anesthetic complications.  She agreed to proceed with surgery and consent was obtained.  Operative Findings: 1.  Removal of previous trimalleolar ankle fixation. 2.  Rereduction of tibiotalar joint with placement of [employer] Trigen 10 x 160 mm hindfoot fusion nail without preparation of the joint  Procedure: The patient was identified in the preoperative holding area. Consent was confirmed with the patient and their family and all questions were answered. The operative extremity was marked after confirmation with the patient. she was then brought back to the operating room by our anesthesia colleagues.  She was carefully transferred over to radiolucent flattop table.  She was placed under general anesthetic.  The left lower extremity was then prepped and draped in usual sterile fashion.  A timeout was performed to verify the patient, the procedure, and the extremity.  Preoperative antibiotics were dosed.  Fluoroscopic imaging  showed the recurrent dislocation and the hardware failure.  I reopened the lateral wound.  The syndesmotic screw had penetrated through the wound.  The most distal aspect also had wound dehiscence with exposed hardware.  At this point I felt that retaining the hardware would be at significant risk of wound healing complications and possible infection.  I removed the lateral plate and screws.  I then proceeded to remove the medial screws through percutaneous incisions.  I then proceeded to reduce the talus underneath the tibia.  She had tightly impacted posterior malleolus that caused her joint to continue to subluxate posteriorly.  I was able to use a clamp to reduce the talus underneath the tibia.  Once I had alignment and the mortise well aligned on the AP view I then directed a guidewire at the plantar aspect of the foot to enter into the tibia through the calcaneus subtalar joint and talus and tibiotalar joint.  I then incised around the guidewire and then used an entry reamer to cross both the subtalar joint and tibiotalar joint to enter the tibia.  I then placed a ball-tipped guidewire down the center of the canal and then proceeded to place a 10 x 160 mm nail.  The alignment was maintained and then using the targeting arm I then placed the talar screw and the cuboid screw for fixation.  I then proceeded to place a screw from lateral to medial.  I then used the targeting arm to place a interlocking screw into the tibial shaft.   The targeting arm was then removed and final fluoroscopic imaging was obtained.  The incision was irrigated thoroughly.  A gram of vancomycin powder 1.2 g of tobramycin powder was placed into the incision.  A layered closure of 2-0 Vicryl and 3-0 nylon was used to close the skin.  Sterile dressings were applied and a well-padded short leg splint was then placed.  The patient was then awoke from anesthesia and taken to the PACU in stable condition.  Post Op Plan/Instructions: The  patient will be nonweightbearing to the left lower extremity.  She will discharge to her nursing facility.  Continue her Eliquis starting tomorrow postoperative day 1.  Return in 2 weeks for x-rays and wound check.  I was present and performed the entire surgery.  Ulyses Southward, PA-C did assist me throughout the case. An assistant was necessary given the difficulty in approach, maintenance of reduction and ability to instrument the fracture.   Truitt Merle, MD [facility]

## 2023-04-07 NOTE — Interval H&P Note (Signed)
History and Physical Interval Note:  04/07/2023 8:53 AM  [name]  has presented today for surgery, with the diagnosis of Left ankle hardware failure.  The various methods of treatment have been discussed with the patient and family. After consideration of risks, benefits and other options for treatment, the patient has consented to  Procedure(s): REVISION FIXATION OF LEFT ANKLE WITH TTC NAILING (Left) as a surgical intervention.  The patient's history has been reviewed, patient examined, no change in status, stable for surgery.  I have reviewed the patient's chart and labs.  Questions were answered to the patient's satisfaction.     Caryn Bee P Karla Pavone

## 2023-04-07 NOTE — Progress Notes (Signed)
Dr. Jena Gauss made aware that patient last took Eliquis yesterday 04/06/23. Ok to proceed per Dr. Jena Gauss.

## 2023-04-07 NOTE — Anesthesia Postprocedure Evaluation (Signed)
Anesthesia Post Note  Patient: [name]  Procedure(s) Performed: REVISION FIXATION OF LEFT ANKLE WITH TTC NAILING (Left: Ankle)     Patient location during evaluation: PACU Anesthesia Type: General Level of consciousness: sedated and patient cooperative Pain management: pain level controlled Vital Signs Assessment: post-procedure vital signs reviewed and stable Respiratory status: spontaneous breathing Cardiovascular status: stable Anesthetic complications: no   No notable events documented.  Last Vitals:  Vitals:   04/07/23 1200 04/07/23 1215  BP: 105/69 114/82  Pulse: (!) 104 (!) 102  Resp: 13 12  Temp:  36.6 C  SpO2: 99% 99%    Last Pain:  Vitals:   04/07/23 1215  TempSrc:   PainSc: 0-No pain                 Lewie Loron

## 2023-04-07 NOTE — Anesthesia Preprocedure Evaluation (Addendum)
Anesthesia Evaluation  Patient identified by MRN, date of birth, ID band Patient awake    Reviewed: Allergy & Precautions, H&P , NPO status , Patient's Chart, lab work & pertinent test results  Airway Mallampati: II  TM Distance: >3 FB Neck ROM: Limited    Dental  (+) Chipped, Dental Advisory Given, Teeth Intact   Pulmonary pneumonia   Pulmonary exam normal breath sounds clear to auscultation       Cardiovascular hypertension, Pt. on medications and Pt. on home beta blockers +CHF  Normal cardiovascular exam+ dysrhythmias Atrial Fibrillation + Valvular Problems/Murmurs (Mild MS)  Rhythm:Irregular Rate:Normal  Echo 11/2022  1. Left ventricular ejection fraction, by estimation, is 65 to 70%. The left ventricle has normal function. The left ventricle has no regional wall motion abnormalities. Left ventricular diastolic function could not be evaluated.   2. Right ventricular systolic function is mildly reduced. The right ventricular size is mildly enlarged. Tricuspid regurgitation signal is inadequate for assessing PA pressure.   3. Left atrial size was mildly dilated.   4. Right atrial size was mildly dilated.   5. The mitral valve is degenerative. Trivial mitral valve regurgitation. Mild mitral stenosis. The mean mitral valve gradient is 4.3 mmHg with average heart rate of 96 bpm. Moderate to severe mitral annular calcification.   6. The aortic valve was not well visualized. Aortic valve regurgitation is trivial. Aortic valve sclerosis/calcification is present, without any evidence of aortic stenosis. Aortic valve area, by VTI measures 2.01 cm. Aortic valve mean gradient measures 4.0 mmHg. Aortic valve Vmax measures 1.21 m/s.   7. The inferior vena cava is dilated in size with <50% respiratory variability, suggesting right atrial pressure of 15 mmHg.     Neuro/Psych   Anxiety     negative neurological ROS     GI/Hepatic Neg liver  ROS,GERD  Medicated,,  Endo/Other  diabetes, Type 1, Insulin Dependent    Renal/GU negative Renal ROS     Musculoskeletal  (+) Arthritis , Osteoarthritis,    Abdominal   Peds  Hematology negative hematology ROS (+)   Anesthesia Other Findings   Reproductive/Obstetrics negative OB ROS                              Anesthesia Physical Anesthesia Plan  ASA: 3  Anesthesia Plan: General   Post-op Pain Management: Tylenol PO (pre-op)* and Regional block*   Induction: Intravenous  PONV Risk Score and Plan: 4 or greater and Ondansetron, Dexamethasone and Treatment may vary due to age or medical condition  Airway Management Planned: Oral ETT  Additional Equipment:   Intra-op Plan:   Post-operative Plan: Extubation in OR  Informed Consent: I have reviewed the patients History and Physical, chart, labs and discussed the procedure including the risks, benefits and alternatives for the proposed anesthesia with the patient or authorized representative who has indicated his/her understanding and acceptance.     Dental advisory given  Plan Discussed with: CRNA  Anesthesia Plan Comments:         Anesthesia Quick Evaluation

## 2023-04-07 NOTE — Anesthesia Procedure Notes (Signed)
Anesthesia Regional Block: Adductor canal block   Pre-Anesthetic Checklist: , timeout performed,  Correct Patient, Correct Site, Correct Laterality,  Correct Procedure, Correct Position, site marked,  Risks and benefits discussed,  Surgical consent,  Pre-op evaluation,  At surgeon's request and post-op pain management  Laterality: Lower and Left  Prep: chloraprep       Needles:  Injection technique: Single-shot  Needle Type: Stimiplex     Needle Length: 9cm  Needle Gauge: 21     Additional Needles:   Procedures:,,,, ultrasound used (permanent image in chart),,    Narrative:  Start time: 04/07/2023 8:37 AM End time: 04/07/2023 8:52 AM Injection made incrementally with aspirations every 5 mL.  Performed by: Personally  Anesthesiologist: Lewie Loron, MD  Additional Notes: BP cuff, EKG monitors applied. Sedation begun. Artery and nerve location verified with ultrasound. Anesthetic injected incrementally (5ml), slowly, and after negative aspirations under direct u/s guidance. Good fascial/perineural spread. Tolerated well.

## 2023-04-07 NOTE — Transfer of Care (Signed)
Immediate Anesthesia Transfer of Care Note  Patient: [name]  Procedure(s) Performed: REVISION FIXATION OF LEFT ANKLE WITH TTC NAILING (Left: Ankle)  Patient Location: PACU  Anesthesia Type:GA combined with regional for post-op pain  Level of Consciousness: drowsy and patient cooperative  Airway & Oxygen Therapy: Patient Spontanous Breathing  Post-op Assessment: Report given to RN and Post -op Vital signs reviewed and stable  Post vital signs: Reviewed and stable  Last Vitals:  Vitals Value Taken Time  BP 147/76 04/07/23 1138  Temp    Pulse 110 04/07/23 1138  Resp 22 04/07/23 1138  SpO2 85 % 04/07/23 1138  Vitals shown include unvalidated device data.  Last Pain:  Vitals:   04/07/23 0910  TempSrc:   PainSc: 0-No pain      Patients Stated Pain Goal: 0 (04/07/23 0745)  Complications: No notable events documented.

## 2023-04-07 NOTE — Anesthesia Procedure Notes (Addendum)
Anesthesia Regional Block: Popliteal block   Pre-Anesthetic Checklist: , timeout performed,  Correct Patient, Correct Site, Correct Laterality,  Correct Procedure, Correct Position, site marked,  Risks and benefits discussed,  Surgical consent,  Pre-op evaluation,  At surgeon's request and post-op pain management  Laterality: Lower and Left  Prep: chloraprep       Needles:  Injection technique: Single-shot  Needle Type: Stimiplex     Needle Length: 10cm  Needle Gauge: 21     Additional Needles:   Procedures:,,,, ultrasound used (permanent image in chart),,   Motor weakness within 5 minutes.  Narrative:  Start time: 04/07/2023 8:52 AM End time: 04/07/2023 8:57 AM Injection made incrementally with aspirations every 5 mL.  Performed by: Personally  Anesthesiologist: Lewie Loron, MD  Additional Notes: Nerve located and needle positioned with direct ultrasound guidance. Good perineural spread. Patient tolerated well.

## 2023-04-07 NOTE — Discharge Instructions (Addendum)
Truitt Merle, MD Ulyses Southward PA-C [facility] [address] [phone] Jani Files)   [phone] (fax)                                  POST-OPERATIVE INSTRUCTIONS     WEIGHT BEARING STATUS: non-weightbearing left lower extremity  RANGE OF MOTION/ACTIVITY:  ok for knee range of motion  WOUND CARE Please keep splint clean dry and intact until follow-up. If your splint gets wet for any reason please contact the office immediately.  Do not stick anything down your splint such as pencils, momey, hangers to try and scratch yourself.  If you feel itchy take Benadryl as prescribed on the bottle for itching You may shower on Post-Op Day #2.  You must keep splint dry during this process and may find that a plastic bag taped around the extremity or alternatively a towel based bath may be a better option.   If you get your splint wet or if it is damaged please contact our clinic.  EXERCISES Due to your splint being in place you will not be able to bear weight through your extremity.   DO NOT PUT ANY WEIGHT ON YOUR OPERATIVE LEG Please use crutches or a walker to avoid weight bearing.   DVT/PE prophylaxis:  Continue Eliquis  DIET: As you were eating previously.  Can use over the counter stool softeners and bowel preparations, such as Miralax, to help with bowel movements.  Narcotics can be constipating.  Be sure to drink plenty of fluids  REGIONAL ANESTHESIA (NERVE BLOCKS) The anesthesia team may have performed a nerve block for you if safe in the setting of your care.  This is a great tool used to minimize pain.  Typically the block may start wearing off overnight but the long acting medicine may last for 3-4 days.  The nerve block wearing off can be a challenging period but please utilize your as needed pain medications to try and manage this period.    POST-OP MEDICATIONS- Multimodal approach to pain control  In general your pain will be controlled with a  combination of substances.  Prescriptions unless otherwise discussed are electronically sent to your pharmacy.  This is a carefully made plan we use to minimize narcotic use.     - Acetaminophen - Non-narcotic pain medicine taken on a scheduled basis   - Tramadol - This is a strong narcotic, to be used only on an "as needed" basis for pain.  -  Eliquis - This medicine is used to minimize the risk of blood clots after surgery.              FOLLOW-UP If you develop a Fever (>101.5), Redness or Drainage from the surgical incision site, please call our office to arrange for an evaluation. Please call the office to schedule a follow-up appointment for your incision check if you do not already have one, 7-10 days post-operatively.   VISIT OUR WEBSITE FOR ADDITIONAL INFORMATION: [email or web address]   HELPFUL INFORMATION  If you had a block, it will wear off between 8-24 hrs postop typically.  This is period when your pain may go from nearly zero to the pain you would have had postop without the block.  This is an abrupt transition but nothing dangerous is happening.  You may take an extra dose of narcotic when this happens.  You should wean off your narcotic medicines as soon  as you are able.  Most patients will be off or using minimal narcotics before their first postop appointment.   We suggest you use the pain medication the first night prior to going to bed, in order to ease any pain when the anesthesia wears off. You should avoid taking pain medications on an empty stomach as it will make you nauseous.  Do not drink alcoholic beverages or take illicit drugs when taking pain medications.  In most states it is against the law to drive while you are in a splint or sling.  And certainly against the law to drive while taking narcotics.  You may return to work/school in the next couple of days when you feel up to it.   Pain medication may make you constipated.  Below are a few solutions to try in  this order: Decrease the amount of pain medication if you aren't having pain. Drink lots of decaffeinated fluids. Drink prune juice and/or each dried prunes  If the first 3 don't work start with additional solutions Take Colace - an over-the-counter stool softener Take Senokot - an over-the-counter laxative Take Miralax - a stronger over-the-counter laxative

## 2023-04-07 NOTE — Anesthesia Procedure Notes (Signed)
Procedure Name: Intubation Date/Time: 04/07/2023 9:47 AM  Performed by: Randon Goldsmith, CRNAPre-anesthesia Checklist: Patient identified, Emergency Drugs available, Suction available and Patient being monitored Patient Re-evaluated:Patient Re-evaluated prior to induction Oxygen Delivery Method: Circle system utilized Preoxygenation: Pre-oxygenation with 100% oxygen Induction Type: IV induction Ventilation: Mask ventilation without difficulty Laryngoscope Size: Glidescope and 3 Grade View: Grade I Tube type: Oral Tube size: 6.5 mm Number of attempts: 2 Airway Equipment and Method: Stylet and Oral airway Placement Confirmation: ETT inserted through vocal cords under direct vision, positive ETCO2 and breath sounds checked- equal and bilateral Secured at: 22 cm Tube secured with: Tape Dental Injury: Teeth and Oropharynx as per pre-operative assessment  Comments: DL x1 with Mac 3 blade, grade 2 view, unable to pass ETT through the cords. DL x2 with glidescope 3, full glottic view on screen, ETT passed easily through vocal cords.

## 2023-04-08 ENCOUNTER — Encounter (HOSPITAL_COMMUNITY): Payer: Self-pay | Admitting: Student

## 2023-04-08 NOTE — Addendum Note (Signed)
Addendum  created 04/08/23 2338 by Lewie Loron, MD   Clinical Note Signed, Intraprocedure Blocks edited, SmartForm saved

## 2023-04-20 DIAGNOSIS — S82852D Displaced trimalleolar fracture of left lower leg, subsequent encounter for closed fracture with routine healing: Secondary | ICD-10-CM | POA: Diagnosis not present

## 2023-04-20 DIAGNOSIS — S72401D Unspecified fracture of lower end of right femur, subsequent encounter for closed fracture with routine healing: Secondary | ICD-10-CM | POA: Diagnosis not present

## 2023-04-26 DIAGNOSIS — H4052X3 Glaucoma secondary to other eye disorders, left eye, severe stage: Secondary | ICD-10-CM | POA: Diagnosis not present

## 2023-04-26 DIAGNOSIS — I10 Essential (primary) hypertension: Secondary | ICD-10-CM | POA: Diagnosis not present

## 2023-04-26 DIAGNOSIS — E1065 Type 1 diabetes mellitus with hyperglycemia: Secondary | ICD-10-CM | POA: Diagnosis not present

## 2023-04-27 DIAGNOSIS — D649 Anemia, unspecified: Secondary | ICD-10-CM | POA: Diagnosis not present

## 2023-04-27 DIAGNOSIS — I509 Heart failure, unspecified: Secondary | ICD-10-CM | POA: Diagnosis not present

## 2023-04-27 DIAGNOSIS — I10 Essential (primary) hypertension: Secondary | ICD-10-CM | POA: Diagnosis not present

## 2023-04-28 DIAGNOSIS — M6281 Muscle weakness (generalized): Secondary | ICD-10-CM | POA: Diagnosis not present

## 2023-04-28 DIAGNOSIS — I10 Essential (primary) hypertension: Secondary | ICD-10-CM | POA: Diagnosis not present

## 2023-04-28 DIAGNOSIS — H4052X3 Glaucoma secondary to other eye disorders, left eye, severe stage: Secondary | ICD-10-CM | POA: Diagnosis not present

## 2023-04-28 DIAGNOSIS — E1065 Type 1 diabetes mellitus with hyperglycemia: Secondary | ICD-10-CM | POA: Diagnosis not present

## 2023-05-04 DIAGNOSIS — S82852D Displaced trimalleolar fracture of left lower leg, subsequent encounter for closed fracture with routine healing: Secondary | ICD-10-CM | POA: Diagnosis not present

## 2023-05-06 DIAGNOSIS — M6281 Muscle weakness (generalized): Secondary | ICD-10-CM | POA: Diagnosis not present

## 2023-05-06 DIAGNOSIS — I10 Essential (primary) hypertension: Secondary | ICD-10-CM | POA: Diagnosis not present

## 2023-05-06 DIAGNOSIS — E1065 Type 1 diabetes mellitus with hyperglycemia: Secondary | ICD-10-CM | POA: Diagnosis not present

## 2023-05-06 DIAGNOSIS — H4052X3 Glaucoma secondary to other eye disorders, left eye, severe stage: Secondary | ICD-10-CM | POA: Diagnosis not present

## 2023-05-10 DIAGNOSIS — H4052X3 Glaucoma secondary to other eye disorders, left eye, severe stage: Secondary | ICD-10-CM | POA: Diagnosis not present

## 2023-05-10 DIAGNOSIS — Z7689 Persons encountering health services in other specified circumstances: Secondary | ICD-10-CM

## 2023-05-10 DIAGNOSIS — I10 Essential (primary) hypertension: Secondary | ICD-10-CM | POA: Diagnosis not present

## 2023-05-10 DIAGNOSIS — M6281 Muscle weakness (generalized): Secondary | ICD-10-CM | POA: Diagnosis not present

## 2023-05-10 DIAGNOSIS — E1065 Type 1 diabetes mellitus with hyperglycemia: Secondary | ICD-10-CM | POA: Diagnosis not present

## 2023-05-12 DIAGNOSIS — M25562 Pain in left knee: Secondary | ICD-10-CM | POA: Diagnosis not present

## 2023-05-12 DIAGNOSIS — M25511 Pain in right shoulder: Secondary | ICD-10-CM | POA: Diagnosis not present

## 2023-05-12 DIAGNOSIS — E109 Type 1 diabetes mellitus without complications: Secondary | ICD-10-CM | POA: Diagnosis not present

## 2023-05-12 DIAGNOSIS — R2681 Unsteadiness on feet: Secondary | ICD-10-CM | POA: Diagnosis not present

## 2023-05-12 DIAGNOSIS — I10 Essential (primary) hypertension: Secondary | ICD-10-CM | POA: Diagnosis not present

## 2023-05-14 DIAGNOSIS — M6281 Muscle weakness (generalized): Secondary | ICD-10-CM | POA: Diagnosis not present

## 2023-05-14 DIAGNOSIS — Z7689 Persons encountering health services in other specified circumstances: Secondary | ICD-10-CM

## 2023-05-14 DIAGNOSIS — E1065 Type 1 diabetes mellitus with hyperglycemia: Secondary | ICD-10-CM | POA: Diagnosis not present

## 2023-05-14 DIAGNOSIS — I10 Essential (primary) hypertension: Secondary | ICD-10-CM | POA: Diagnosis not present

## 2023-05-14 DIAGNOSIS — H4052X3 Glaucoma secondary to other eye disorders, left eye, severe stage: Secondary | ICD-10-CM | POA: Diagnosis not present

## 2023-05-17 DIAGNOSIS — N898 Other specified noninflammatory disorders of vagina: Secondary | ICD-10-CM | POA: Diagnosis not present

## 2023-05-17 DIAGNOSIS — S72491D Other fracture of lower end of right femur, subsequent encounter for closed fracture with routine healing: Secondary | ICD-10-CM | POA: Diagnosis not present

## 2023-05-17 DIAGNOSIS — I4891 Unspecified atrial fibrillation: Secondary | ICD-10-CM | POA: Diagnosis not present

## 2023-05-17 DIAGNOSIS — E669 Obesity, unspecified: Secondary | ICD-10-CM | POA: Diagnosis not present

## 2023-05-17 DIAGNOSIS — H4052X3 Glaucoma secondary to other eye disorders, left eye, severe stage: Secondary | ICD-10-CM | POA: Diagnosis not present

## 2023-05-17 DIAGNOSIS — E1065 Type 1 diabetes mellitus with hyperglycemia: Secondary | ICD-10-CM | POA: Diagnosis not present

## 2023-05-17 DIAGNOSIS — S72401D Unspecified fracture of lower end of right femur, subsequent encounter for closed fracture with routine healing: Secondary | ICD-10-CM | POA: Diagnosis not present

## 2023-05-17 DIAGNOSIS — M8589 Other specified disorders of bone density and structure, multiple sites: Secondary | ICD-10-CM | POA: Diagnosis not present

## 2023-05-17 DIAGNOSIS — R5381 Other malaise: Secondary | ICD-10-CM | POA: Diagnosis not present

## 2023-05-17 DIAGNOSIS — M6281 Muscle weakness (generalized): Secondary | ICD-10-CM | POA: Diagnosis not present

## 2023-05-17 DIAGNOSIS — I5032 Chronic diastolic (congestive) heart failure: Secondary | ICD-10-CM | POA: Diagnosis not present

## 2023-05-17 DIAGNOSIS — I10 Essential (primary) hypertension: Secondary | ICD-10-CM | POA: Diagnosis not present

## 2023-05-17 DIAGNOSIS — F411 Generalized anxiety disorder: Secondary | ICD-10-CM | POA: Diagnosis not present

## 2023-05-17 DIAGNOSIS — I83892 Varicose veins of left lower extremities with other complications: Secondary | ICD-10-CM | POA: Diagnosis not present

## 2023-05-17 DIAGNOSIS — R278 Other lack of coordination: Secondary | ICD-10-CM | POA: Diagnosis not present

## 2023-05-17 DIAGNOSIS — E785 Hyperlipidemia, unspecified: Secondary | ICD-10-CM | POA: Diagnosis not present

## 2023-05-17 DIAGNOSIS — K5900 Constipation, unspecified: Secondary | ICD-10-CM | POA: Diagnosis not present

## 2023-05-17 DIAGNOSIS — M19042 Primary osteoarthritis, left hand: Secondary | ICD-10-CM | POA: Diagnosis not present

## 2023-05-17 DIAGNOSIS — S82852D Displaced trimalleolar fracture of left lower leg, subsequent encounter for closed fracture with routine healing: Secondary | ICD-10-CM | POA: Diagnosis not present

## 2023-05-24 DIAGNOSIS — M6281 Muscle weakness (generalized): Secondary | ICD-10-CM | POA: Diagnosis not present

## 2023-05-24 DIAGNOSIS — I10 Essential (primary) hypertension: Secondary | ICD-10-CM | POA: Diagnosis not present

## 2023-05-24 DIAGNOSIS — E1065 Type 1 diabetes mellitus with hyperglycemia: Secondary | ICD-10-CM | POA: Diagnosis not present

## 2023-05-24 DIAGNOSIS — H4052X3 Glaucoma secondary to other eye disorders, left eye, severe stage: Secondary | ICD-10-CM | POA: Diagnosis not present

## 2023-05-25 DIAGNOSIS — S72401D Unspecified fracture of lower end of right femur, subsequent encounter for closed fracture with routine healing: Secondary | ICD-10-CM | POA: Diagnosis not present

## 2023-05-25 DIAGNOSIS — S82852D Displaced trimalleolar fracture of left lower leg, subsequent encounter for closed fracture with routine healing: Secondary | ICD-10-CM | POA: Diagnosis not present

## 2023-06-09 DIAGNOSIS — M6281 Muscle weakness (generalized): Secondary | ICD-10-CM | POA: Diagnosis not present

## 2023-06-09 DIAGNOSIS — R2689 Other abnormalities of gait and mobility: Secondary | ICD-10-CM | POA: Diagnosis not present

## 2023-06-09 DIAGNOSIS — R2681 Unsteadiness on feet: Secondary | ICD-10-CM | POA: Diagnosis not present

## 2023-06-09 DIAGNOSIS — R278 Other lack of coordination: Secondary | ICD-10-CM | POA: Diagnosis not present

## 2023-06-09 DIAGNOSIS — S72491D Other fracture of lower end of right femur, subsequent encounter for closed fracture with routine healing: Secondary | ICD-10-CM | POA: Diagnosis not present

## 2023-06-10 DIAGNOSIS — R278 Other lack of coordination: Secondary | ICD-10-CM | POA: Diagnosis not present

## 2023-06-10 DIAGNOSIS — M6281 Muscle weakness (generalized): Secondary | ICD-10-CM | POA: Diagnosis not present

## 2023-06-10 DIAGNOSIS — R2689 Other abnormalities of gait and mobility: Secondary | ICD-10-CM | POA: Diagnosis not present

## 2023-06-10 DIAGNOSIS — R2681 Unsteadiness on feet: Secondary | ICD-10-CM | POA: Diagnosis not present

## 2023-06-10 DIAGNOSIS — S72491D Other fracture of lower end of right femur, subsequent encounter for closed fracture with routine healing: Secondary | ICD-10-CM | POA: Diagnosis not present

## 2023-06-11 DIAGNOSIS — R2681 Unsteadiness on feet: Secondary | ICD-10-CM | POA: Diagnosis not present

## 2023-06-11 DIAGNOSIS — M25562 Pain in left knee: Secondary | ICD-10-CM | POA: Diagnosis not present

## 2023-06-11 DIAGNOSIS — M25511 Pain in right shoulder: Secondary | ICD-10-CM | POA: Diagnosis not present

## 2023-06-14 DIAGNOSIS — M6281 Muscle weakness (generalized): Secondary | ICD-10-CM | POA: Diagnosis not present

## 2023-06-14 DIAGNOSIS — S72491D Other fracture of lower end of right femur, subsequent encounter for closed fracture with routine healing: Secondary | ICD-10-CM | POA: Diagnosis not present

## 2023-06-14 DIAGNOSIS — R2681 Unsteadiness on feet: Secondary | ICD-10-CM | POA: Diagnosis not present

## 2023-06-14 DIAGNOSIS — R278 Other lack of coordination: Secondary | ICD-10-CM | POA: Diagnosis not present

## 2023-06-14 DIAGNOSIS — R2689 Other abnormalities of gait and mobility: Secondary | ICD-10-CM | POA: Diagnosis not present

## 2023-06-16 DIAGNOSIS — M6281 Muscle weakness (generalized): Secondary | ICD-10-CM | POA: Diagnosis not present

## 2023-06-16 DIAGNOSIS — S72491D Other fracture of lower end of right femur, subsequent encounter for closed fracture with routine healing: Secondary | ICD-10-CM | POA: Diagnosis not present

## 2023-06-16 DIAGNOSIS — R278 Other lack of coordination: Secondary | ICD-10-CM | POA: Diagnosis not present

## 2023-06-16 DIAGNOSIS — R2681 Unsteadiness on feet: Secondary | ICD-10-CM | POA: Diagnosis not present

## 2023-06-16 DIAGNOSIS — R2689 Other abnormalities of gait and mobility: Secondary | ICD-10-CM | POA: Diagnosis not present

## 2023-06-18 DIAGNOSIS — R278 Other lack of coordination: Secondary | ICD-10-CM | POA: Diagnosis not present

## 2023-06-18 DIAGNOSIS — S72491D Other fracture of lower end of right femur, subsequent encounter for closed fracture with routine healing: Secondary | ICD-10-CM | POA: Diagnosis not present

## 2023-06-18 DIAGNOSIS — R2681 Unsteadiness on feet: Secondary | ICD-10-CM | POA: Diagnosis not present

## 2023-06-18 DIAGNOSIS — R2689 Other abnormalities of gait and mobility: Secondary | ICD-10-CM | POA: Diagnosis not present

## 2023-06-18 DIAGNOSIS — M6281 Muscle weakness (generalized): Secondary | ICD-10-CM | POA: Diagnosis not present

## 2023-06-21 DIAGNOSIS — R278 Other lack of coordination: Secondary | ICD-10-CM | POA: Diagnosis not present

## 2023-06-21 DIAGNOSIS — R2681 Unsteadiness on feet: Secondary | ICD-10-CM | POA: Diagnosis not present

## 2023-06-21 DIAGNOSIS — R2689 Other abnormalities of gait and mobility: Secondary | ICD-10-CM | POA: Diagnosis not present

## 2023-06-21 DIAGNOSIS — S72491D Other fracture of lower end of right femur, subsequent encounter for closed fracture with routine healing: Secondary | ICD-10-CM | POA: Diagnosis not present

## 2023-06-21 DIAGNOSIS — M6281 Muscle weakness (generalized): Secondary | ICD-10-CM | POA: Diagnosis not present

## 2023-06-23 DIAGNOSIS — S72491D Other fracture of lower end of right femur, subsequent encounter for closed fracture with routine healing: Secondary | ICD-10-CM | POA: Diagnosis not present

## 2023-06-23 DIAGNOSIS — M6281 Muscle weakness (generalized): Secondary | ICD-10-CM | POA: Diagnosis not present

## 2023-06-23 DIAGNOSIS — R2689 Other abnormalities of gait and mobility: Secondary | ICD-10-CM | POA: Diagnosis not present

## 2023-06-23 DIAGNOSIS — R278 Other lack of coordination: Secondary | ICD-10-CM | POA: Diagnosis not present

## 2023-06-23 DIAGNOSIS — R2681 Unsteadiness on feet: Secondary | ICD-10-CM | POA: Diagnosis not present

## 2023-06-25 DIAGNOSIS — R2689 Other abnormalities of gait and mobility: Secondary | ICD-10-CM | POA: Diagnosis not present

## 2023-06-25 DIAGNOSIS — R278 Other lack of coordination: Secondary | ICD-10-CM | POA: Diagnosis not present

## 2023-06-25 DIAGNOSIS — S72491D Other fracture of lower end of right femur, subsequent encounter for closed fracture with routine healing: Secondary | ICD-10-CM | POA: Diagnosis not present

## 2023-06-25 DIAGNOSIS — R2681 Unsteadiness on feet: Secondary | ICD-10-CM | POA: Diagnosis not present

## 2023-06-25 DIAGNOSIS — M6281 Muscle weakness (generalized): Secondary | ICD-10-CM | POA: Diagnosis not present

## 2023-06-28 DIAGNOSIS — R2689 Other abnormalities of gait and mobility: Secondary | ICD-10-CM | POA: Diagnosis not present

## 2023-06-28 DIAGNOSIS — S72491D Other fracture of lower end of right femur, subsequent encounter for closed fracture with routine healing: Secondary | ICD-10-CM | POA: Diagnosis not present

## 2023-06-28 DIAGNOSIS — R2681 Unsteadiness on feet: Secondary | ICD-10-CM | POA: Diagnosis not present

## 2023-06-28 DIAGNOSIS — M6281 Muscle weakness (generalized): Secondary | ICD-10-CM | POA: Diagnosis not present

## 2023-06-28 DIAGNOSIS — R278 Other lack of coordination: Secondary | ICD-10-CM | POA: Diagnosis not present

## 2023-06-29 DIAGNOSIS — R2689 Other abnormalities of gait and mobility: Secondary | ICD-10-CM | POA: Diagnosis not present

## 2023-06-29 DIAGNOSIS — R278 Other lack of coordination: Secondary | ICD-10-CM | POA: Diagnosis not present

## 2023-06-29 DIAGNOSIS — R2681 Unsteadiness on feet: Secondary | ICD-10-CM | POA: Diagnosis not present

## 2023-06-29 DIAGNOSIS — M6281 Muscle weakness (generalized): Secondary | ICD-10-CM | POA: Diagnosis not present

## 2023-06-29 DIAGNOSIS — S72491D Other fracture of lower end of right femur, subsequent encounter for closed fracture with routine healing: Secondary | ICD-10-CM | POA: Diagnosis not present

## 2023-06-30 DIAGNOSIS — M6281 Muscle weakness (generalized): Secondary | ICD-10-CM | POA: Diagnosis not present

## 2023-06-30 DIAGNOSIS — R2689 Other abnormalities of gait and mobility: Secondary | ICD-10-CM | POA: Diagnosis not present

## 2023-06-30 DIAGNOSIS — S72491D Other fracture of lower end of right femur, subsequent encounter for closed fracture with routine healing: Secondary | ICD-10-CM | POA: Diagnosis not present

## 2023-06-30 DIAGNOSIS — R2681 Unsteadiness on feet: Secondary | ICD-10-CM | POA: Diagnosis not present

## 2023-06-30 DIAGNOSIS — R278 Other lack of coordination: Secondary | ICD-10-CM | POA: Diagnosis not present

## 2023-07-02 DIAGNOSIS — M6281 Muscle weakness (generalized): Secondary | ICD-10-CM | POA: Diagnosis not present

## 2023-07-02 DIAGNOSIS — R2689 Other abnormalities of gait and mobility: Secondary | ICD-10-CM | POA: Diagnosis not present

## 2023-07-02 DIAGNOSIS — R2681 Unsteadiness on feet: Secondary | ICD-10-CM | POA: Diagnosis not present

## 2023-07-02 DIAGNOSIS — S72491D Other fracture of lower end of right femur, subsequent encounter for closed fracture with routine healing: Secondary | ICD-10-CM | POA: Diagnosis not present

## 2023-07-02 DIAGNOSIS — R278 Other lack of coordination: Secondary | ICD-10-CM | POA: Diagnosis not present

## 2023-07-05 DIAGNOSIS — M6281 Muscle weakness (generalized): Secondary | ICD-10-CM | POA: Diagnosis not present

## 2023-07-05 DIAGNOSIS — R278 Other lack of coordination: Secondary | ICD-10-CM | POA: Diagnosis not present

## 2023-07-05 DIAGNOSIS — R2681 Unsteadiness on feet: Secondary | ICD-10-CM | POA: Diagnosis not present

## 2023-07-05 DIAGNOSIS — S72491D Other fracture of lower end of right femur, subsequent encounter for closed fracture with routine healing: Secondary | ICD-10-CM | POA: Diagnosis not present

## 2023-07-05 DIAGNOSIS — R2689 Other abnormalities of gait and mobility: Secondary | ICD-10-CM | POA: Diagnosis not present

## 2023-07-06 DIAGNOSIS — S72491D Other fracture of lower end of right femur, subsequent encounter for closed fracture with routine healing: Secondary | ICD-10-CM | POA: Diagnosis not present

## 2023-07-06 DIAGNOSIS — R278 Other lack of coordination: Secondary | ICD-10-CM | POA: Diagnosis not present

## 2023-07-06 DIAGNOSIS — S72401D Unspecified fracture of lower end of right femur, subsequent encounter for closed fracture with routine healing: Secondary | ICD-10-CM | POA: Diagnosis not present

## 2023-07-06 DIAGNOSIS — R2689 Other abnormalities of gait and mobility: Secondary | ICD-10-CM | POA: Diagnosis not present

## 2023-07-06 DIAGNOSIS — S82852D Displaced trimalleolar fracture of left lower leg, subsequent encounter for closed fracture with routine healing: Secondary | ICD-10-CM | POA: Diagnosis not present

## 2023-07-06 DIAGNOSIS — R2681 Unsteadiness on feet: Secondary | ICD-10-CM | POA: Diagnosis not present

## 2023-07-06 DIAGNOSIS — M6281 Muscle weakness (generalized): Secondary | ICD-10-CM | POA: Diagnosis not present

## 2023-07-06 DIAGNOSIS — M1712 Unilateral primary osteoarthritis, left knee: Secondary | ICD-10-CM | POA: Diagnosis not present

## 2023-07-07 DIAGNOSIS — S72491D Other fracture of lower end of right femur, subsequent encounter for closed fracture with routine healing: Secondary | ICD-10-CM | POA: Diagnosis not present

## 2023-07-07 DIAGNOSIS — M6281 Muscle weakness (generalized): Secondary | ICD-10-CM | POA: Diagnosis not present

## 2023-07-07 DIAGNOSIS — S82852D Displaced trimalleolar fracture of left lower leg, subsequent encounter for closed fracture with routine healing: Secondary | ICD-10-CM | POA: Diagnosis not present

## 2023-07-07 DIAGNOSIS — Z7689 Persons encountering health services in other specified circumstances: Secondary | ICD-10-CM

## 2023-07-07 DIAGNOSIS — R2681 Unsteadiness on feet: Secondary | ICD-10-CM | POA: Diagnosis not present

## 2023-07-07 DIAGNOSIS — H4052X3 Glaucoma secondary to other eye disorders, left eye, severe stage: Secondary | ICD-10-CM | POA: Diagnosis not present

## 2023-07-07 DIAGNOSIS — R278 Other lack of coordination: Secondary | ICD-10-CM | POA: Diagnosis not present

## 2023-07-07 DIAGNOSIS — E669 Obesity, unspecified: Secondary | ICD-10-CM | POA: Diagnosis not present

## 2023-07-07 DIAGNOSIS — I10 Essential (primary) hypertension: Secondary | ICD-10-CM | POA: Diagnosis not present

## 2023-07-07 DIAGNOSIS — R2689 Other abnormalities of gait and mobility: Secondary | ICD-10-CM | POA: Diagnosis not present

## 2023-07-07 DIAGNOSIS — E1065 Type 1 diabetes mellitus with hyperglycemia: Secondary | ICD-10-CM | POA: Diagnosis not present

## 2023-07-08 DIAGNOSIS — I4891 Unspecified atrial fibrillation: Secondary | ICD-10-CM | POA: Diagnosis not present

## 2023-07-08 DIAGNOSIS — S82852D Displaced trimalleolar fracture of left lower leg, subsequent encounter for closed fracture with routine healing: Secondary | ICD-10-CM | POA: Diagnosis not present

## 2023-07-08 DIAGNOSIS — E669 Obesity, unspecified: Secondary | ICD-10-CM | POA: Diagnosis not present

## 2023-07-08 DIAGNOSIS — I5032 Chronic diastolic (congestive) heart failure: Secondary | ICD-10-CM | POA: Diagnosis not present

## 2023-07-08 DIAGNOSIS — I110 Hypertensive heart disease with heart failure: Secondary | ICD-10-CM | POA: Diagnosis not present

## 2023-07-08 DIAGNOSIS — Z993 Dependence on wheelchair: Secondary | ICD-10-CM | POA: Diagnosis not present

## 2023-07-08 DIAGNOSIS — E785 Hyperlipidemia, unspecified: Secondary | ICD-10-CM | POA: Diagnosis not present

## 2023-07-08 DIAGNOSIS — M19042 Primary osteoarthritis, left hand: Secondary | ICD-10-CM | POA: Diagnosis not present

## 2023-07-08 DIAGNOSIS — E1065 Type 1 diabetes mellitus with hyperglycemia: Secondary | ICD-10-CM | POA: Diagnosis not present

## 2023-07-08 DIAGNOSIS — K5900 Constipation, unspecified: Secondary | ICD-10-CM | POA: Diagnosis not present

## 2023-07-08 DIAGNOSIS — H4052X3 Glaucoma secondary to other eye disorders, left eye, severe stage: Secondary | ICD-10-CM | POA: Diagnosis not present

## 2023-07-08 DIAGNOSIS — M6281 Muscle weakness (generalized): Secondary | ICD-10-CM | POA: Diagnosis not present

## 2023-07-08 DIAGNOSIS — M1712 Unilateral primary osteoarthritis, left knee: Secondary | ICD-10-CM | POA: Diagnosis not present

## 2023-07-08 DIAGNOSIS — S72491D Other fracture of lower end of right femur, subsequent encounter for closed fracture with routine healing: Secondary | ICD-10-CM | POA: Diagnosis not present

## 2023-07-08 DIAGNOSIS — M8589 Other specified disorders of bone density and structure, multiple sites: Secondary | ICD-10-CM | POA: Diagnosis not present

## 2023-07-08 DIAGNOSIS — F411 Generalized anxiety disorder: Secondary | ICD-10-CM | POA: Diagnosis not present

## 2023-07-08 DIAGNOSIS — Z556 Problems related to health literacy: Secondary | ICD-10-CM | POA: Diagnosis not present

## 2023-07-13 DIAGNOSIS — M19042 Primary osteoarthritis, left hand: Secondary | ICD-10-CM | POA: Diagnosis not present

## 2023-07-13 DIAGNOSIS — E669 Obesity, unspecified: Secondary | ICD-10-CM | POA: Diagnosis not present

## 2023-07-13 DIAGNOSIS — Z993 Dependence on wheelchair: Secondary | ICD-10-CM | POA: Diagnosis not present

## 2023-07-13 DIAGNOSIS — R851 Abnormal level of hormones in specimens from digestive organs and abdominal cavity: Secondary | ICD-10-CM | POA: Diagnosis not present

## 2023-07-13 DIAGNOSIS — H4052X3 Glaucoma secondary to other eye disorders, left eye, severe stage: Secondary | ICD-10-CM | POA: Diagnosis not present

## 2023-07-13 DIAGNOSIS — M8589 Other specified disorders of bone density and structure, multiple sites: Secondary | ICD-10-CM | POA: Diagnosis not present

## 2023-07-13 DIAGNOSIS — S82852D Displaced trimalleolar fracture of left lower leg, subsequent encounter for closed fracture with routine healing: Secondary | ICD-10-CM | POA: Diagnosis not present

## 2023-07-13 DIAGNOSIS — E785 Hyperlipidemia, unspecified: Secondary | ICD-10-CM | POA: Diagnosis not present

## 2023-07-13 DIAGNOSIS — I4891 Unspecified atrial fibrillation: Secondary | ICD-10-CM | POA: Diagnosis not present

## 2023-07-13 DIAGNOSIS — M1712 Unilateral primary osteoarthritis, left knee: Secondary | ICD-10-CM | POA: Diagnosis not present

## 2023-07-13 DIAGNOSIS — M6281 Muscle weakness (generalized): Secondary | ICD-10-CM | POA: Diagnosis not present

## 2023-07-13 DIAGNOSIS — I5032 Chronic diastolic (congestive) heart failure: Secondary | ICD-10-CM | POA: Diagnosis not present

## 2023-07-13 DIAGNOSIS — I110 Hypertensive heart disease with heart failure: Secondary | ICD-10-CM | POA: Diagnosis not present

## 2023-07-13 DIAGNOSIS — K5900 Constipation, unspecified: Secondary | ICD-10-CM | POA: Diagnosis not present

## 2023-07-13 DIAGNOSIS — F411 Generalized anxiety disorder: Secondary | ICD-10-CM | POA: Diagnosis not present

## 2023-07-13 DIAGNOSIS — S72491D Other fracture of lower end of right femur, subsequent encounter for closed fracture with routine healing: Secondary | ICD-10-CM | POA: Diagnosis not present

## 2023-07-13 DIAGNOSIS — E1065 Type 1 diabetes mellitus with hyperglycemia: Secondary | ICD-10-CM | POA: Diagnosis not present

## 2023-07-15 DIAGNOSIS — E669 Obesity, unspecified: Secondary | ICD-10-CM | POA: Diagnosis not present

## 2023-07-15 DIAGNOSIS — S72491D Other fracture of lower end of right femur, subsequent encounter for closed fracture with routine healing: Secondary | ICD-10-CM | POA: Diagnosis not present

## 2023-07-15 DIAGNOSIS — E1065 Type 1 diabetes mellitus with hyperglycemia: Secondary | ICD-10-CM | POA: Diagnosis not present

## 2023-07-15 DIAGNOSIS — I5032 Chronic diastolic (congestive) heart failure: Secondary | ICD-10-CM | POA: Diagnosis not present

## 2023-07-15 DIAGNOSIS — M19042 Primary osteoarthritis, left hand: Secondary | ICD-10-CM | POA: Diagnosis not present

## 2023-07-15 DIAGNOSIS — F411 Generalized anxiety disorder: Secondary | ICD-10-CM | POA: Diagnosis not present

## 2023-07-15 DIAGNOSIS — E785 Hyperlipidemia, unspecified: Secondary | ICD-10-CM | POA: Diagnosis not present

## 2023-07-15 DIAGNOSIS — M8589 Other specified disorders of bone density and structure, multiple sites: Secondary | ICD-10-CM | POA: Diagnosis not present

## 2023-07-15 DIAGNOSIS — K5900 Constipation, unspecified: Secondary | ICD-10-CM | POA: Diagnosis not present

## 2023-07-15 DIAGNOSIS — M6281 Muscle weakness (generalized): Secondary | ICD-10-CM | POA: Diagnosis not present

## 2023-07-15 DIAGNOSIS — I4891 Unspecified atrial fibrillation: Secondary | ICD-10-CM | POA: Diagnosis not present

## 2023-07-15 DIAGNOSIS — M1712 Unilateral primary osteoarthritis, left knee: Secondary | ICD-10-CM | POA: Diagnosis not present

## 2023-07-15 DIAGNOSIS — S82852D Displaced trimalleolar fracture of left lower leg, subsequent encounter for closed fracture with routine healing: Secondary | ICD-10-CM | POA: Diagnosis not present

## 2023-07-15 DIAGNOSIS — I110 Hypertensive heart disease with heart failure: Secondary | ICD-10-CM | POA: Diagnosis not present

## 2023-07-15 DIAGNOSIS — H4052X3 Glaucoma secondary to other eye disorders, left eye, severe stage: Secondary | ICD-10-CM | POA: Diagnosis not present

## 2023-07-15 DIAGNOSIS — Z993 Dependence on wheelchair: Secondary | ICD-10-CM | POA: Diagnosis not present

## 2023-07-16 DIAGNOSIS — S72491D Other fracture of lower end of right femur, subsequent encounter for closed fracture with routine healing: Secondary | ICD-10-CM | POA: Diagnosis not present

## 2023-07-16 DIAGNOSIS — M19042 Primary osteoarthritis, left hand: Secondary | ICD-10-CM | POA: Diagnosis not present

## 2023-07-16 DIAGNOSIS — I110 Hypertensive heart disease with heart failure: Secondary | ICD-10-CM | POA: Diagnosis not present

## 2023-07-16 DIAGNOSIS — M8589 Other specified disorders of bone density and structure, multiple sites: Secondary | ICD-10-CM | POA: Diagnosis not present

## 2023-07-16 DIAGNOSIS — H4052X3 Glaucoma secondary to other eye disorders, left eye, severe stage: Secondary | ICD-10-CM | POA: Diagnosis not present

## 2023-07-16 DIAGNOSIS — E669 Obesity, unspecified: Secondary | ICD-10-CM | POA: Diagnosis not present

## 2023-07-16 DIAGNOSIS — E785 Hyperlipidemia, unspecified: Secondary | ICD-10-CM | POA: Diagnosis not present

## 2023-07-16 DIAGNOSIS — F411 Generalized anxiety disorder: Secondary | ICD-10-CM | POA: Diagnosis not present

## 2023-07-16 DIAGNOSIS — I5032 Chronic diastolic (congestive) heart failure: Secondary | ICD-10-CM | POA: Diagnosis not present

## 2023-07-16 DIAGNOSIS — Z993 Dependence on wheelchair: Secondary | ICD-10-CM | POA: Diagnosis not present

## 2023-07-16 DIAGNOSIS — M6281 Muscle weakness (generalized): Secondary | ICD-10-CM | POA: Diagnosis not present

## 2023-07-16 DIAGNOSIS — M1712 Unilateral primary osteoarthritis, left knee: Secondary | ICD-10-CM | POA: Diagnosis not present

## 2023-07-16 DIAGNOSIS — I4891 Unspecified atrial fibrillation: Secondary | ICD-10-CM | POA: Diagnosis not present

## 2023-07-16 DIAGNOSIS — E1065 Type 1 diabetes mellitus with hyperglycemia: Secondary | ICD-10-CM | POA: Diagnosis not present

## 2023-07-16 DIAGNOSIS — S82852D Displaced trimalleolar fracture of left lower leg, subsequent encounter for closed fracture with routine healing: Secondary | ICD-10-CM | POA: Diagnosis not present

## 2023-07-16 DIAGNOSIS — K5900 Constipation, unspecified: Secondary | ICD-10-CM | POA: Diagnosis not present

## 2023-07-19 DIAGNOSIS — M6281 Muscle weakness (generalized): Secondary | ICD-10-CM | POA: Diagnosis not present

## 2023-07-19 DIAGNOSIS — I5032 Chronic diastolic (congestive) heart failure: Secondary | ICD-10-CM | POA: Diagnosis not present

## 2023-07-19 DIAGNOSIS — S72491D Other fracture of lower end of right femur, subsequent encounter for closed fracture with routine healing: Secondary | ICD-10-CM | POA: Diagnosis not present

## 2023-07-19 DIAGNOSIS — H4052X3 Glaucoma secondary to other eye disorders, left eye, severe stage: Secondary | ICD-10-CM | POA: Diagnosis not present

## 2023-07-19 DIAGNOSIS — M1712 Unilateral primary osteoarthritis, left knee: Secondary | ICD-10-CM | POA: Diagnosis not present

## 2023-07-19 DIAGNOSIS — I4891 Unspecified atrial fibrillation: Secondary | ICD-10-CM | POA: Diagnosis not present

## 2023-07-19 DIAGNOSIS — E1065 Type 1 diabetes mellitus with hyperglycemia: Secondary | ICD-10-CM | POA: Diagnosis not present

## 2023-07-19 DIAGNOSIS — E669 Obesity, unspecified: Secondary | ICD-10-CM | POA: Diagnosis not present

## 2023-07-19 DIAGNOSIS — F411 Generalized anxiety disorder: Secondary | ICD-10-CM | POA: Diagnosis not present

## 2023-07-19 DIAGNOSIS — Z993 Dependence on wheelchair: Secondary | ICD-10-CM | POA: Diagnosis not present

## 2023-07-19 DIAGNOSIS — S82852D Displaced trimalleolar fracture of left lower leg, subsequent encounter for closed fracture with routine healing: Secondary | ICD-10-CM | POA: Diagnosis not present

## 2023-07-19 DIAGNOSIS — E785 Hyperlipidemia, unspecified: Secondary | ICD-10-CM | POA: Diagnosis not present

## 2023-07-19 DIAGNOSIS — K5900 Constipation, unspecified: Secondary | ICD-10-CM | POA: Diagnosis not present

## 2023-07-19 DIAGNOSIS — M8589 Other specified disorders of bone density and structure, multiple sites: Secondary | ICD-10-CM | POA: Diagnosis not present

## 2023-07-19 DIAGNOSIS — I110 Hypertensive heart disease with heart failure: Secondary | ICD-10-CM | POA: Diagnosis not present

## 2023-07-19 DIAGNOSIS — M19042 Primary osteoarthritis, left hand: Secondary | ICD-10-CM | POA: Diagnosis not present

## 2023-07-20 DIAGNOSIS — M6281 Muscle weakness (generalized): Secondary | ICD-10-CM | POA: Diagnosis not present

## 2023-07-20 DIAGNOSIS — Z993 Dependence on wheelchair: Secondary | ICD-10-CM | POA: Diagnosis not present

## 2023-07-20 DIAGNOSIS — H4052X3 Glaucoma secondary to other eye disorders, left eye, severe stage: Secondary | ICD-10-CM | POA: Diagnosis not present

## 2023-07-20 DIAGNOSIS — M8589 Other specified disorders of bone density and structure, multiple sites: Secondary | ICD-10-CM | POA: Diagnosis not present

## 2023-07-20 DIAGNOSIS — E1065 Type 1 diabetes mellitus with hyperglycemia: Secondary | ICD-10-CM | POA: Diagnosis not present

## 2023-07-20 DIAGNOSIS — E785 Hyperlipidemia, unspecified: Secondary | ICD-10-CM | POA: Diagnosis not present

## 2023-07-20 DIAGNOSIS — I5032 Chronic diastolic (congestive) heart failure: Secondary | ICD-10-CM | POA: Diagnosis not present

## 2023-07-20 DIAGNOSIS — I110 Hypertensive heart disease with heart failure: Secondary | ICD-10-CM | POA: Diagnosis not present

## 2023-07-20 DIAGNOSIS — M1712 Unilateral primary osteoarthritis, left knee: Secondary | ICD-10-CM | POA: Diagnosis not present

## 2023-07-20 DIAGNOSIS — S72491D Other fracture of lower end of right femur, subsequent encounter for closed fracture with routine healing: Secondary | ICD-10-CM | POA: Diagnosis not present

## 2023-07-20 DIAGNOSIS — E669 Obesity, unspecified: Secondary | ICD-10-CM | POA: Diagnosis not present

## 2023-07-20 DIAGNOSIS — I4891 Unspecified atrial fibrillation: Secondary | ICD-10-CM | POA: Diagnosis not present

## 2023-07-20 DIAGNOSIS — S82852D Displaced trimalleolar fracture of left lower leg, subsequent encounter for closed fracture with routine healing: Secondary | ICD-10-CM | POA: Diagnosis not present

## 2023-07-20 DIAGNOSIS — K5900 Constipation, unspecified: Secondary | ICD-10-CM | POA: Diagnosis not present

## 2023-07-20 DIAGNOSIS — F411 Generalized anxiety disorder: Secondary | ICD-10-CM | POA: Diagnosis not present

## 2023-07-20 DIAGNOSIS — M19042 Primary osteoarthritis, left hand: Secondary | ICD-10-CM | POA: Diagnosis not present

## 2023-07-21 DIAGNOSIS — I480 Paroxysmal atrial fibrillation: Secondary | ICD-10-CM | POA: Diagnosis not present

## 2023-07-21 DIAGNOSIS — E1051 Type 1 diabetes mellitus with diabetic peripheral angiopathy without gangrene: Secondary | ICD-10-CM | POA: Diagnosis not present

## 2023-07-22 DIAGNOSIS — E1065 Type 1 diabetes mellitus with hyperglycemia: Secondary | ICD-10-CM | POA: Diagnosis not present

## 2023-07-22 DIAGNOSIS — I4891 Unspecified atrial fibrillation: Secondary | ICD-10-CM | POA: Diagnosis not present

## 2023-07-22 DIAGNOSIS — H4052X3 Glaucoma secondary to other eye disorders, left eye, severe stage: Secondary | ICD-10-CM | POA: Diagnosis not present

## 2023-07-22 DIAGNOSIS — Z993 Dependence on wheelchair: Secondary | ICD-10-CM | POA: Diagnosis not present

## 2023-07-22 DIAGNOSIS — F411 Generalized anxiety disorder: Secondary | ICD-10-CM | POA: Diagnosis not present

## 2023-07-22 DIAGNOSIS — S72491D Other fracture of lower end of right femur, subsequent encounter for closed fracture with routine healing: Secondary | ICD-10-CM | POA: Diagnosis not present

## 2023-07-22 DIAGNOSIS — I110 Hypertensive heart disease with heart failure: Secondary | ICD-10-CM | POA: Diagnosis not present

## 2023-07-22 DIAGNOSIS — M8589 Other specified disorders of bone density and structure, multiple sites: Secondary | ICD-10-CM | POA: Diagnosis not present

## 2023-07-22 DIAGNOSIS — M6281 Muscle weakness (generalized): Secondary | ICD-10-CM | POA: Diagnosis not present

## 2023-07-22 DIAGNOSIS — I5032 Chronic diastolic (congestive) heart failure: Secondary | ICD-10-CM | POA: Diagnosis not present

## 2023-07-22 DIAGNOSIS — S82852D Displaced trimalleolar fracture of left lower leg, subsequent encounter for closed fracture with routine healing: Secondary | ICD-10-CM | POA: Diagnosis not present

## 2023-07-22 DIAGNOSIS — K5900 Constipation, unspecified: Secondary | ICD-10-CM | POA: Diagnosis not present

## 2023-07-22 DIAGNOSIS — M19042 Primary osteoarthritis, left hand: Secondary | ICD-10-CM | POA: Diagnosis not present

## 2023-07-22 DIAGNOSIS — M1712 Unilateral primary osteoarthritis, left knee: Secondary | ICD-10-CM | POA: Diagnosis not present

## 2023-07-22 DIAGNOSIS — E669 Obesity, unspecified: Secondary | ICD-10-CM | POA: Diagnosis not present

## 2023-07-22 DIAGNOSIS — E785 Hyperlipidemia, unspecified: Secondary | ICD-10-CM | POA: Diagnosis not present

## 2023-07-23 DIAGNOSIS — M8589 Other specified disorders of bone density and structure, multiple sites: Secondary | ICD-10-CM | POA: Diagnosis not present

## 2023-07-23 DIAGNOSIS — S72491D Other fracture of lower end of right femur, subsequent encounter for closed fracture with routine healing: Secondary | ICD-10-CM | POA: Diagnosis not present

## 2023-07-23 DIAGNOSIS — E669 Obesity, unspecified: Secondary | ICD-10-CM | POA: Diagnosis not present

## 2023-07-23 DIAGNOSIS — F411 Generalized anxiety disorder: Secondary | ICD-10-CM | POA: Diagnosis not present

## 2023-07-23 DIAGNOSIS — M19042 Primary osteoarthritis, left hand: Secondary | ICD-10-CM | POA: Diagnosis not present

## 2023-07-23 DIAGNOSIS — M6281 Muscle weakness (generalized): Secondary | ICD-10-CM | POA: Diagnosis not present

## 2023-07-23 DIAGNOSIS — I4891 Unspecified atrial fibrillation: Secondary | ICD-10-CM | POA: Diagnosis not present

## 2023-07-23 DIAGNOSIS — E785 Hyperlipidemia, unspecified: Secondary | ICD-10-CM | POA: Diagnosis not present

## 2023-07-23 DIAGNOSIS — Z993 Dependence on wheelchair: Secondary | ICD-10-CM | POA: Diagnosis not present

## 2023-07-23 DIAGNOSIS — S82852D Displaced trimalleolar fracture of left lower leg, subsequent encounter for closed fracture with routine healing: Secondary | ICD-10-CM | POA: Diagnosis not present

## 2023-07-23 DIAGNOSIS — I5032 Chronic diastolic (congestive) heart failure: Secondary | ICD-10-CM | POA: Diagnosis not present

## 2023-07-23 DIAGNOSIS — I110 Hypertensive heart disease with heart failure: Secondary | ICD-10-CM | POA: Diagnosis not present

## 2023-07-23 DIAGNOSIS — H4052X3 Glaucoma secondary to other eye disorders, left eye, severe stage: Secondary | ICD-10-CM | POA: Diagnosis not present

## 2023-07-23 DIAGNOSIS — M1712 Unilateral primary osteoarthritis, left knee: Secondary | ICD-10-CM | POA: Diagnosis not present

## 2023-07-23 DIAGNOSIS — K5900 Constipation, unspecified: Secondary | ICD-10-CM | POA: Diagnosis not present

## 2023-07-23 DIAGNOSIS — E1065 Type 1 diabetes mellitus with hyperglycemia: Secondary | ICD-10-CM | POA: Diagnosis not present

## 2023-07-27 DIAGNOSIS — K5900 Constipation, unspecified: Secondary | ICD-10-CM | POA: Diagnosis not present

## 2023-07-27 DIAGNOSIS — M1712 Unilateral primary osteoarthritis, left knee: Secondary | ICD-10-CM | POA: Diagnosis not present

## 2023-07-27 DIAGNOSIS — E669 Obesity, unspecified: Secondary | ICD-10-CM | POA: Diagnosis not present

## 2023-07-27 DIAGNOSIS — F411 Generalized anxiety disorder: Secondary | ICD-10-CM | POA: Diagnosis not present

## 2023-07-27 DIAGNOSIS — H4052X3 Glaucoma secondary to other eye disorders, left eye, severe stage: Secondary | ICD-10-CM | POA: Diagnosis not present

## 2023-07-27 DIAGNOSIS — M19042 Primary osteoarthritis, left hand: Secondary | ICD-10-CM | POA: Diagnosis not present

## 2023-07-27 DIAGNOSIS — E1065 Type 1 diabetes mellitus with hyperglycemia: Secondary | ICD-10-CM | POA: Diagnosis not present

## 2023-07-27 DIAGNOSIS — S82852D Displaced trimalleolar fracture of left lower leg, subsequent encounter for closed fracture with routine healing: Secondary | ICD-10-CM | POA: Diagnosis not present

## 2023-07-27 DIAGNOSIS — M6281 Muscle weakness (generalized): Secondary | ICD-10-CM | POA: Diagnosis not present

## 2023-07-27 DIAGNOSIS — I5032 Chronic diastolic (congestive) heart failure: Secondary | ICD-10-CM | POA: Diagnosis not present

## 2023-07-27 DIAGNOSIS — I110 Hypertensive heart disease with heart failure: Secondary | ICD-10-CM | POA: Diagnosis not present

## 2023-07-27 DIAGNOSIS — M8589 Other specified disorders of bone density and structure, multiple sites: Secondary | ICD-10-CM | POA: Diagnosis not present

## 2023-07-27 DIAGNOSIS — E785 Hyperlipidemia, unspecified: Secondary | ICD-10-CM | POA: Diagnosis not present

## 2023-07-27 DIAGNOSIS — S72491D Other fracture of lower end of right femur, subsequent encounter for closed fracture with routine healing: Secondary | ICD-10-CM | POA: Diagnosis not present

## 2023-07-27 DIAGNOSIS — Z993 Dependence on wheelchair: Secondary | ICD-10-CM | POA: Diagnosis not present

## 2023-07-27 DIAGNOSIS — I4891 Unspecified atrial fibrillation: Secondary | ICD-10-CM | POA: Diagnosis not present

## 2023-07-29 DIAGNOSIS — M6281 Muscle weakness (generalized): Secondary | ICD-10-CM | POA: Diagnosis not present

## 2023-07-29 DIAGNOSIS — H4052X3 Glaucoma secondary to other eye disorders, left eye, severe stage: Secondary | ICD-10-CM | POA: Diagnosis not present

## 2023-07-29 DIAGNOSIS — I4891 Unspecified atrial fibrillation: Secondary | ICD-10-CM | POA: Diagnosis not present

## 2023-07-29 DIAGNOSIS — Z993 Dependence on wheelchair: Secondary | ICD-10-CM | POA: Diagnosis not present

## 2023-07-29 DIAGNOSIS — I5032 Chronic diastolic (congestive) heart failure: Secondary | ICD-10-CM | POA: Diagnosis not present

## 2023-07-29 DIAGNOSIS — M19042 Primary osteoarthritis, left hand: Secondary | ICD-10-CM | POA: Diagnosis not present

## 2023-07-29 DIAGNOSIS — M1712 Unilateral primary osteoarthritis, left knee: Secondary | ICD-10-CM | POA: Diagnosis not present

## 2023-07-29 DIAGNOSIS — I110 Hypertensive heart disease with heart failure: Secondary | ICD-10-CM | POA: Diagnosis not present

## 2023-07-29 DIAGNOSIS — M8589 Other specified disorders of bone density and structure, multiple sites: Secondary | ICD-10-CM | POA: Diagnosis not present

## 2023-07-29 DIAGNOSIS — S72491D Other fracture of lower end of right femur, subsequent encounter for closed fracture with routine healing: Secondary | ICD-10-CM | POA: Diagnosis not present

## 2023-07-29 DIAGNOSIS — S82852D Displaced trimalleolar fracture of left lower leg, subsequent encounter for closed fracture with routine healing: Secondary | ICD-10-CM | POA: Diagnosis not present

## 2023-07-29 DIAGNOSIS — E1065 Type 1 diabetes mellitus with hyperglycemia: Secondary | ICD-10-CM | POA: Diagnosis not present

## 2023-07-29 DIAGNOSIS — F411 Generalized anxiety disorder: Secondary | ICD-10-CM | POA: Diagnosis not present

## 2023-07-29 DIAGNOSIS — K5900 Constipation, unspecified: Secondary | ICD-10-CM | POA: Diagnosis not present

## 2023-07-29 DIAGNOSIS — E785 Hyperlipidemia, unspecified: Secondary | ICD-10-CM | POA: Diagnosis not present

## 2023-07-29 DIAGNOSIS — E669 Obesity, unspecified: Secondary | ICD-10-CM | POA: Diagnosis not present

## 2023-07-30 DIAGNOSIS — E669 Obesity, unspecified: Secondary | ICD-10-CM | POA: Diagnosis not present

## 2023-07-30 DIAGNOSIS — Z993 Dependence on wheelchair: Secondary | ICD-10-CM | POA: Diagnosis not present

## 2023-07-30 DIAGNOSIS — S82852D Displaced trimalleolar fracture of left lower leg, subsequent encounter for closed fracture with routine healing: Secondary | ICD-10-CM | POA: Diagnosis not present

## 2023-07-30 DIAGNOSIS — I5032 Chronic diastolic (congestive) heart failure: Secondary | ICD-10-CM | POA: Diagnosis not present

## 2023-07-30 DIAGNOSIS — M19042 Primary osteoarthritis, left hand: Secondary | ICD-10-CM | POA: Diagnosis not present

## 2023-07-30 DIAGNOSIS — I110 Hypertensive heart disease with heart failure: Secondary | ICD-10-CM | POA: Diagnosis not present

## 2023-07-30 DIAGNOSIS — I4891 Unspecified atrial fibrillation: Secondary | ICD-10-CM | POA: Diagnosis not present

## 2023-07-30 DIAGNOSIS — F411 Generalized anxiety disorder: Secondary | ICD-10-CM | POA: Diagnosis not present

## 2023-07-30 DIAGNOSIS — E1065 Type 1 diabetes mellitus with hyperglycemia: Secondary | ICD-10-CM | POA: Diagnosis not present

## 2023-07-30 DIAGNOSIS — M6281 Muscle weakness (generalized): Secondary | ICD-10-CM | POA: Diagnosis not present

## 2023-07-30 DIAGNOSIS — H4052X3 Glaucoma secondary to other eye disorders, left eye, severe stage: Secondary | ICD-10-CM | POA: Diagnosis not present

## 2023-07-30 DIAGNOSIS — E785 Hyperlipidemia, unspecified: Secondary | ICD-10-CM | POA: Diagnosis not present

## 2023-07-30 DIAGNOSIS — M8589 Other specified disorders of bone density and structure, multiple sites: Secondary | ICD-10-CM | POA: Diagnosis not present

## 2023-07-30 DIAGNOSIS — S72491D Other fracture of lower end of right femur, subsequent encounter for closed fracture with routine healing: Secondary | ICD-10-CM | POA: Diagnosis not present

## 2023-07-30 DIAGNOSIS — K5900 Constipation, unspecified: Secondary | ICD-10-CM | POA: Diagnosis not present

## 2023-07-30 DIAGNOSIS — M1712 Unilateral primary osteoarthritis, left knee: Secondary | ICD-10-CM | POA: Diagnosis not present

## 2023-08-02 ENCOUNTER — Ambulatory Visit (INDEPENDENT_AMBULATORY_CARE_PROVIDER_SITE_OTHER): Payer: Medicare Other | Admitting: Podiatry

## 2023-08-02 ENCOUNTER — Encounter: Payer: Self-pay | Admitting: Podiatry

## 2023-08-02 DIAGNOSIS — M79674 Pain in right toe(s): Secondary | ICD-10-CM

## 2023-08-02 DIAGNOSIS — B351 Tinea unguium: Secondary | ICD-10-CM

## 2023-08-02 DIAGNOSIS — M79675 Pain in left toe(s): Secondary | ICD-10-CM | POA: Diagnosis not present
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
## 2023-08-03 DIAGNOSIS — K5900 Constipation, unspecified: Secondary | ICD-10-CM | POA: Diagnosis not present

## 2023-08-03 DIAGNOSIS — Z993 Dependence on wheelchair: Secondary | ICD-10-CM | POA: Diagnosis not present

## 2023-08-03 DIAGNOSIS — H4052X3 Glaucoma secondary to other eye disorders, left eye, severe stage: Secondary | ICD-10-CM | POA: Diagnosis not present

## 2023-08-03 DIAGNOSIS — I5032 Chronic diastolic (congestive) heart failure: Secondary | ICD-10-CM | POA: Diagnosis not present

## 2023-08-03 DIAGNOSIS — I4891 Unspecified atrial fibrillation: Secondary | ICD-10-CM | POA: Diagnosis not present

## 2023-08-03 DIAGNOSIS — E785 Hyperlipidemia, unspecified: Secondary | ICD-10-CM | POA: Diagnosis not present

## 2023-08-03 DIAGNOSIS — I110 Hypertensive heart disease with heart failure: Secondary | ICD-10-CM | POA: Diagnosis not present

## 2023-08-03 DIAGNOSIS — M8589 Other specified disorders of bone density and structure, multiple sites: Secondary | ICD-10-CM | POA: Diagnosis not present

## 2023-08-03 DIAGNOSIS — S72491D Other fracture of lower end of right femur, subsequent encounter for closed fracture with routine healing: Secondary | ICD-10-CM | POA: Diagnosis not present

## 2023-08-03 DIAGNOSIS — F411 Generalized anxiety disorder: Secondary | ICD-10-CM | POA: Diagnosis not present

## 2023-08-03 DIAGNOSIS — S82852D Displaced trimalleolar fracture of left lower leg, subsequent encounter for closed fracture with routine healing: Secondary | ICD-10-CM | POA: Diagnosis not present

## 2023-08-03 DIAGNOSIS — E669 Obesity, unspecified: Secondary | ICD-10-CM | POA: Diagnosis not present

## 2023-08-03 DIAGNOSIS — M1712 Unilateral primary osteoarthritis, left knee: Secondary | ICD-10-CM | POA: Diagnosis not present

## 2023-08-03 DIAGNOSIS — M19042 Primary osteoarthritis, left hand: Secondary | ICD-10-CM | POA: Diagnosis not present

## 2023-08-03 DIAGNOSIS — E1065 Type 1 diabetes mellitus with hyperglycemia: Secondary | ICD-10-CM | POA: Diagnosis not present

## 2023-08-03 DIAGNOSIS — M6281 Muscle weakness (generalized): Secondary | ICD-10-CM | POA: Diagnosis not present

## 2023-08-04 DIAGNOSIS — F411 Generalized anxiety disorder: Secondary | ICD-10-CM | POA: Diagnosis not present

## 2023-08-04 DIAGNOSIS — I110 Hypertensive heart disease with heart failure: Secondary | ICD-10-CM | POA: Diagnosis not present

## 2023-08-04 DIAGNOSIS — Z993 Dependence on wheelchair: Secondary | ICD-10-CM | POA: Diagnosis not present

## 2023-08-04 DIAGNOSIS — M19042 Primary osteoarthritis, left hand: Secondary | ICD-10-CM | POA: Diagnosis not present

## 2023-08-04 DIAGNOSIS — K5900 Constipation, unspecified: Secondary | ICD-10-CM | POA: Diagnosis not present

## 2023-08-04 DIAGNOSIS — S82852D Displaced trimalleolar fracture of left lower leg, subsequent encounter for closed fracture with routine healing: Secondary | ICD-10-CM | POA: Diagnosis not present

## 2023-08-04 DIAGNOSIS — E669 Obesity, unspecified: Secondary | ICD-10-CM | POA: Diagnosis not present

## 2023-08-04 DIAGNOSIS — E785 Hyperlipidemia, unspecified: Secondary | ICD-10-CM | POA: Diagnosis not present

## 2023-08-04 DIAGNOSIS — M6281 Muscle weakness (generalized): Secondary | ICD-10-CM | POA: Diagnosis not present

## 2023-08-04 DIAGNOSIS — I4891 Unspecified atrial fibrillation: Secondary | ICD-10-CM | POA: Diagnosis not present

## 2023-08-04 DIAGNOSIS — S72491D Other fracture of lower end of right femur, subsequent encounter for closed fracture with routine healing: Secondary | ICD-10-CM | POA: Diagnosis not present

## 2023-08-04 DIAGNOSIS — E1065 Type 1 diabetes mellitus with hyperglycemia: Secondary | ICD-10-CM | POA: Diagnosis not present

## 2023-08-04 DIAGNOSIS — M8589 Other specified disorders of bone density and structure, multiple sites: Secondary | ICD-10-CM | POA: Diagnosis not present

## 2023-08-04 DIAGNOSIS — M1712 Unilateral primary osteoarthritis, left knee: Secondary | ICD-10-CM | POA: Diagnosis not present

## 2023-08-04 DIAGNOSIS — R851 Abnormal level of hormones in specimens from digestive organs and abdominal cavity: Secondary | ICD-10-CM | POA: Diagnosis not present

## 2023-08-04 DIAGNOSIS — I5032 Chronic diastolic (congestive) heart failure: Secondary | ICD-10-CM | POA: Diagnosis not present

## 2023-08-04 DIAGNOSIS — H4052X3 Glaucoma secondary to other eye disorders, left eye, severe stage: Secondary | ICD-10-CM | POA: Diagnosis not present

## 2023-08-05 DIAGNOSIS — E1065 Type 1 diabetes mellitus with hyperglycemia: Secondary | ICD-10-CM | POA: Diagnosis not present

## 2023-08-05 DIAGNOSIS — I5032 Chronic diastolic (congestive) heart failure: Secondary | ICD-10-CM | POA: Diagnosis not present

## 2023-08-05 DIAGNOSIS — M6281 Muscle weakness (generalized): Secondary | ICD-10-CM | POA: Diagnosis not present

## 2023-08-05 DIAGNOSIS — M19042 Primary osteoarthritis, left hand: Secondary | ICD-10-CM | POA: Diagnosis not present

## 2023-08-05 DIAGNOSIS — E785 Hyperlipidemia, unspecified: Secondary | ICD-10-CM | POA: Diagnosis not present

## 2023-08-05 DIAGNOSIS — M1712 Unilateral primary osteoarthritis, left knee: Secondary | ICD-10-CM | POA: Diagnosis not present

## 2023-08-05 DIAGNOSIS — S72491D Other fracture of lower end of right femur, subsequent encounter for closed fracture with routine healing: Secondary | ICD-10-CM | POA: Diagnosis not present

## 2023-08-05 DIAGNOSIS — H4052X3 Glaucoma secondary to other eye disorders, left eye, severe stage: Secondary | ICD-10-CM | POA: Diagnosis not present

## 2023-08-05 DIAGNOSIS — F411 Generalized anxiety disorder: Secondary | ICD-10-CM | POA: Diagnosis not present

## 2023-08-05 DIAGNOSIS — S82852D Displaced trimalleolar fracture of left lower leg, subsequent encounter for closed fracture with routine healing: Secondary | ICD-10-CM | POA: Diagnosis not present

## 2023-08-05 DIAGNOSIS — M8589 Other specified disorders of bone density and structure, multiple sites: Secondary | ICD-10-CM | POA: Diagnosis not present

## 2023-08-05 DIAGNOSIS — Z993 Dependence on wheelchair: Secondary | ICD-10-CM | POA: Diagnosis not present

## 2023-08-05 DIAGNOSIS — E669 Obesity, unspecified: Secondary | ICD-10-CM | POA: Diagnosis not present

## 2023-08-05 DIAGNOSIS — I4891 Unspecified atrial fibrillation: Secondary | ICD-10-CM | POA: Diagnosis not present

## 2023-08-05 DIAGNOSIS — K5900 Constipation, unspecified: Secondary | ICD-10-CM | POA: Diagnosis not present

## 2023-08-05 DIAGNOSIS — I110 Hypertensive heart disease with heart failure: Secondary | ICD-10-CM | POA: Diagnosis not present

## 2023-08-12 DIAGNOSIS — S72491D Other fracture of lower end of right femur, subsequent encounter for closed fracture with routine healing: Secondary | ICD-10-CM | POA: Diagnosis not present

## 2023-08-12 DIAGNOSIS — Z556 Problems related to health literacy: Secondary | ICD-10-CM | POA: Diagnosis not present

## 2023-08-12 DIAGNOSIS — Z993 Dependence on wheelchair: Secondary | ICD-10-CM | POA: Diagnosis not present

## 2023-08-12 DIAGNOSIS — F411 Generalized anxiety disorder: Secondary | ICD-10-CM | POA: Diagnosis not present

## 2023-08-12 DIAGNOSIS — M1712 Unilateral primary osteoarthritis, left knee: Secondary | ICD-10-CM | POA: Diagnosis not present

## 2023-08-12 DIAGNOSIS — I110 Hypertensive heart disease with heart failure: Secondary | ICD-10-CM | POA: Diagnosis not present

## 2023-08-12 DIAGNOSIS — M8589 Other specified disorders of bone density and structure, multiple sites: Secondary | ICD-10-CM | POA: Diagnosis not present

## 2023-08-12 DIAGNOSIS — M19042 Primary osteoarthritis, left hand: Secondary | ICD-10-CM | POA: Diagnosis not present

## 2023-08-12 DIAGNOSIS — I5032 Chronic diastolic (congestive) heart failure: Secondary | ICD-10-CM | POA: Diagnosis not present

## 2023-08-12 DIAGNOSIS — K5900 Constipation, unspecified: Secondary | ICD-10-CM | POA: Diagnosis not present

## 2023-08-12 DIAGNOSIS — I4891 Unspecified atrial fibrillation: Secondary | ICD-10-CM | POA: Diagnosis not present

## 2023-08-12 DIAGNOSIS — M6281 Muscle weakness (generalized): Secondary | ICD-10-CM | POA: Diagnosis not present

## 2023-08-12 DIAGNOSIS — S82852D Displaced trimalleolar fracture of left lower leg, subsequent encounter for closed fracture with routine healing: Secondary | ICD-10-CM | POA: Diagnosis not present

## 2023-08-12 DIAGNOSIS — E669 Obesity, unspecified: Secondary | ICD-10-CM | POA: Diagnosis not present

## 2023-08-12 DIAGNOSIS — E1065 Type 1 diabetes mellitus with hyperglycemia: Secondary | ICD-10-CM | POA: Diagnosis not present

## 2023-08-12 DIAGNOSIS — E785 Hyperlipidemia, unspecified: Secondary | ICD-10-CM | POA: Diagnosis not present

## 2023-08-12 DIAGNOSIS — H4052X3 Glaucoma secondary to other eye disorders, left eye, severe stage: Secondary | ICD-10-CM | POA: Diagnosis not present

## 2023-08-14 DIAGNOSIS — K5900 Constipation, unspecified: Secondary | ICD-10-CM | POA: Diagnosis not present

## 2023-08-14 DIAGNOSIS — M6281 Muscle weakness (generalized): Secondary | ICD-10-CM | POA: Diagnosis not present

## 2023-08-14 DIAGNOSIS — M1712 Unilateral primary osteoarthritis, left knee: Secondary | ICD-10-CM | POA: Diagnosis not present

## 2023-08-14 DIAGNOSIS — E1065 Type 1 diabetes mellitus with hyperglycemia: Secondary | ICD-10-CM | POA: Diagnosis not present

## 2023-08-14 DIAGNOSIS — F411 Generalized anxiety disorder: Secondary | ICD-10-CM | POA: Diagnosis not present

## 2023-08-14 DIAGNOSIS — E785 Hyperlipidemia, unspecified: Secondary | ICD-10-CM | POA: Diagnosis not present

## 2023-08-14 DIAGNOSIS — M8589 Other specified disorders of bone density and structure, multiple sites: Secondary | ICD-10-CM | POA: Diagnosis not present

## 2023-08-14 DIAGNOSIS — I5032 Chronic diastolic (congestive) heart failure: Secondary | ICD-10-CM | POA: Diagnosis not present

## 2023-08-14 DIAGNOSIS — I4891 Unspecified atrial fibrillation: Secondary | ICD-10-CM | POA: Diagnosis not present

## 2023-08-14 DIAGNOSIS — E669 Obesity, unspecified: Secondary | ICD-10-CM | POA: Diagnosis not present

## 2023-08-14 DIAGNOSIS — Z993 Dependence on wheelchair: Secondary | ICD-10-CM | POA: Diagnosis not present

## 2023-08-14 DIAGNOSIS — S72491D Other fracture of lower end of right femur, subsequent encounter for closed fracture with routine healing: Secondary | ICD-10-CM | POA: Diagnosis not present

## 2023-08-14 DIAGNOSIS — S82852D Displaced trimalleolar fracture of left lower leg, subsequent encounter for closed fracture with routine healing: Secondary | ICD-10-CM | POA: Diagnosis not present

## 2023-08-14 DIAGNOSIS — H4052X3 Glaucoma secondary to other eye disorders, left eye, severe stage: Secondary | ICD-10-CM | POA: Diagnosis not present

## 2023-08-14 DIAGNOSIS — I110 Hypertensive heart disease with heart failure: Secondary | ICD-10-CM | POA: Diagnosis not present

## 2023-08-14 DIAGNOSIS — M19042 Primary osteoarthritis, left hand: Secondary | ICD-10-CM | POA: Diagnosis not present

## 2023-08-17 DIAGNOSIS — E669 Obesity, unspecified: Secondary | ICD-10-CM | POA: Diagnosis not present

## 2023-08-17 DIAGNOSIS — M8589 Other specified disorders of bone density and structure, multiple sites: Secondary | ICD-10-CM | POA: Diagnosis not present

## 2023-08-17 DIAGNOSIS — F411 Generalized anxiety disorder: Secondary | ICD-10-CM | POA: Diagnosis not present

## 2023-08-17 DIAGNOSIS — M19042 Primary osteoarthritis, left hand: Secondary | ICD-10-CM | POA: Diagnosis not present

## 2023-08-17 DIAGNOSIS — K5900 Constipation, unspecified: Secondary | ICD-10-CM | POA: Diagnosis not present

## 2023-08-17 DIAGNOSIS — E1065 Type 1 diabetes mellitus with hyperglycemia: Secondary | ICD-10-CM | POA: Diagnosis not present

## 2023-08-17 DIAGNOSIS — H4052X3 Glaucoma secondary to other eye disorders, left eye, severe stage: Secondary | ICD-10-CM | POA: Diagnosis not present

## 2023-08-17 DIAGNOSIS — I110 Hypertensive heart disease with heart failure: Secondary | ICD-10-CM | POA: Diagnosis not present

## 2023-08-17 DIAGNOSIS — M6281 Muscle weakness (generalized): Secondary | ICD-10-CM | POA: Diagnosis not present

## 2023-08-17 DIAGNOSIS — M1712 Unilateral primary osteoarthritis, left knee: Secondary | ICD-10-CM | POA: Diagnosis not present

## 2023-08-17 DIAGNOSIS — E785 Hyperlipidemia, unspecified: Secondary | ICD-10-CM | POA: Diagnosis not present

## 2023-08-17 DIAGNOSIS — I5032 Chronic diastolic (congestive) heart failure: Secondary | ICD-10-CM | POA: Diagnosis not present

## 2023-08-17 DIAGNOSIS — I4891 Unspecified atrial fibrillation: Secondary | ICD-10-CM | POA: Diagnosis not present

## 2023-08-17 DIAGNOSIS — S82852D Displaced trimalleolar fracture of left lower leg, subsequent encounter for closed fracture with routine healing: Secondary | ICD-10-CM | POA: Diagnosis not present

## 2023-08-17 DIAGNOSIS — Z993 Dependence on wheelchair: Secondary | ICD-10-CM | POA: Diagnosis not present

## 2023-08-17 DIAGNOSIS — S72491D Other fracture of lower end of right femur, subsequent encounter for closed fracture with routine healing: Secondary | ICD-10-CM | POA: Diagnosis not present

## 2023-08-19 DIAGNOSIS — I5032 Chronic diastolic (congestive) heart failure: Secondary | ICD-10-CM | POA: Diagnosis not present

## 2023-08-19 DIAGNOSIS — M8589 Other specified disorders of bone density and structure, multiple sites: Secondary | ICD-10-CM | POA: Diagnosis not present

## 2023-08-19 DIAGNOSIS — M1712 Unilateral primary osteoarthritis, left knee: Secondary | ICD-10-CM | POA: Diagnosis not present

## 2023-08-19 DIAGNOSIS — E669 Obesity, unspecified: Secondary | ICD-10-CM | POA: Diagnosis not present

## 2023-08-19 DIAGNOSIS — I4891 Unspecified atrial fibrillation: Secondary | ICD-10-CM | POA: Diagnosis not present

## 2023-08-19 DIAGNOSIS — F411 Generalized anxiety disorder: Secondary | ICD-10-CM | POA: Diagnosis not present

## 2023-08-19 DIAGNOSIS — M6281 Muscle weakness (generalized): Secondary | ICD-10-CM | POA: Diagnosis not present

## 2023-08-19 DIAGNOSIS — Z993 Dependence on wheelchair: Secondary | ICD-10-CM | POA: Diagnosis not present

## 2023-08-19 DIAGNOSIS — H4052X3 Glaucoma secondary to other eye disorders, left eye, severe stage: Secondary | ICD-10-CM | POA: Diagnosis not present

## 2023-08-19 DIAGNOSIS — E1065 Type 1 diabetes mellitus with hyperglycemia: Secondary | ICD-10-CM | POA: Diagnosis not present

## 2023-08-19 DIAGNOSIS — I110 Hypertensive heart disease with heart failure: Secondary | ICD-10-CM | POA: Diagnosis not present

## 2023-08-19 DIAGNOSIS — E785 Hyperlipidemia, unspecified: Secondary | ICD-10-CM | POA: Diagnosis not present

## 2023-08-19 DIAGNOSIS — K5900 Constipation, unspecified: Secondary | ICD-10-CM | POA: Diagnosis not present

## 2023-08-19 DIAGNOSIS — S72491D Other fracture of lower end of right femur, subsequent encounter for closed fracture with routine healing: Secondary | ICD-10-CM | POA: Diagnosis not present

## 2023-08-19 DIAGNOSIS — S82852D Displaced trimalleolar fracture of left lower leg, subsequent encounter for closed fracture with routine healing: Secondary | ICD-10-CM | POA: Diagnosis not present

## 2023-08-19 DIAGNOSIS — M19042 Primary osteoarthritis, left hand: Secondary | ICD-10-CM | POA: Diagnosis not present

## 2023-08-24 DIAGNOSIS — I110 Hypertensive heart disease with heart failure: Secondary | ICD-10-CM | POA: Diagnosis not present

## 2023-08-24 DIAGNOSIS — K5900 Constipation, unspecified: Secondary | ICD-10-CM | POA: Diagnosis not present

## 2023-08-24 DIAGNOSIS — M6281 Muscle weakness (generalized): Secondary | ICD-10-CM | POA: Diagnosis not present

## 2023-08-24 DIAGNOSIS — I4891 Unspecified atrial fibrillation: Secondary | ICD-10-CM | POA: Diagnosis not present

## 2023-08-24 DIAGNOSIS — S82852D Displaced trimalleolar fracture of left lower leg, subsequent encounter for closed fracture with routine healing: Secondary | ICD-10-CM | POA: Diagnosis not present

## 2023-08-24 DIAGNOSIS — I5032 Chronic diastolic (congestive) heart failure: Secondary | ICD-10-CM | POA: Diagnosis not present

## 2023-08-24 DIAGNOSIS — S72491D Other fracture of lower end of right femur, subsequent encounter for closed fracture with routine healing: Secondary | ICD-10-CM | POA: Diagnosis not present

## 2023-08-24 DIAGNOSIS — H4052X3 Glaucoma secondary to other eye disorders, left eye, severe stage: Secondary | ICD-10-CM | POA: Diagnosis not present

## 2023-08-24 DIAGNOSIS — E785 Hyperlipidemia, unspecified: Secondary | ICD-10-CM | POA: Diagnosis not present

## 2023-08-24 DIAGNOSIS — E1065 Type 1 diabetes mellitus with hyperglycemia: Secondary | ICD-10-CM | POA: Diagnosis not present

## 2023-08-24 DIAGNOSIS — E669 Obesity, unspecified: Secondary | ICD-10-CM | POA: Diagnosis not present

## 2023-08-24 DIAGNOSIS — Z993 Dependence on wheelchair: Secondary | ICD-10-CM | POA: Diagnosis not present

## 2023-08-24 DIAGNOSIS — M8589 Other specified disorders of bone density and structure, multiple sites: Secondary | ICD-10-CM | POA: Diagnosis not present

## 2023-08-24 DIAGNOSIS — F411 Generalized anxiety disorder: Secondary | ICD-10-CM | POA: Diagnosis not present

## 2023-08-24 DIAGNOSIS — M1712 Unilateral primary osteoarthritis, left knee: Secondary | ICD-10-CM | POA: Diagnosis not present

## 2023-08-24 DIAGNOSIS — M19042 Primary osteoarthritis, left hand: Secondary | ICD-10-CM | POA: Diagnosis not present

## 2023-08-26 DIAGNOSIS — E669 Obesity, unspecified: Secondary | ICD-10-CM | POA: Diagnosis not present

## 2023-08-26 DIAGNOSIS — I110 Hypertensive heart disease with heart failure: Secondary | ICD-10-CM | POA: Diagnosis not present

## 2023-08-26 DIAGNOSIS — M1712 Unilateral primary osteoarthritis, left knee: Secondary | ICD-10-CM | POA: Diagnosis not present

## 2023-08-26 DIAGNOSIS — E785 Hyperlipidemia, unspecified: Secondary | ICD-10-CM | POA: Diagnosis not present

## 2023-08-26 DIAGNOSIS — M6281 Muscle weakness (generalized): Secondary | ICD-10-CM | POA: Diagnosis not present

## 2023-08-26 DIAGNOSIS — I4891 Unspecified atrial fibrillation: Secondary | ICD-10-CM | POA: Diagnosis not present

## 2023-08-26 DIAGNOSIS — M19042 Primary osteoarthritis, left hand: Secondary | ICD-10-CM | POA: Diagnosis not present

## 2023-08-26 DIAGNOSIS — E1065 Type 1 diabetes mellitus with hyperglycemia: Secondary | ICD-10-CM | POA: Diagnosis not present

## 2023-08-26 DIAGNOSIS — M8589 Other specified disorders of bone density and structure, multiple sites: Secondary | ICD-10-CM | POA: Diagnosis not present

## 2023-08-26 DIAGNOSIS — F411 Generalized anxiety disorder: Secondary | ICD-10-CM | POA: Diagnosis not present

## 2023-08-26 DIAGNOSIS — H4052X3 Glaucoma secondary to other eye disorders, left eye, severe stage: Secondary | ICD-10-CM | POA: Diagnosis not present

## 2023-08-26 DIAGNOSIS — S72491D Other fracture of lower end of right femur, subsequent encounter for closed fracture with routine healing: Secondary | ICD-10-CM | POA: Diagnosis not present

## 2023-08-26 DIAGNOSIS — S82852D Displaced trimalleolar fracture of left lower leg, subsequent encounter for closed fracture with routine healing: Secondary | ICD-10-CM | POA: Diagnosis not present

## 2023-08-26 DIAGNOSIS — K5900 Constipation, unspecified: Secondary | ICD-10-CM | POA: Diagnosis not present

## 2023-08-26 DIAGNOSIS — Z993 Dependence on wheelchair: Secondary | ICD-10-CM | POA: Diagnosis not present

## 2023-08-26 DIAGNOSIS — I5032 Chronic diastolic (congestive) heart failure: Secondary | ICD-10-CM | POA: Diagnosis not present

## 2023-08-31 DIAGNOSIS — E1065 Type 1 diabetes mellitus with hyperglycemia: Secondary | ICD-10-CM | POA: Diagnosis not present

## 2023-08-31 DIAGNOSIS — M8589 Other specified disorders of bone density and structure, multiple sites: Secondary | ICD-10-CM | POA: Diagnosis not present

## 2023-08-31 DIAGNOSIS — M1712 Unilateral primary osteoarthritis, left knee: Secondary | ICD-10-CM | POA: Diagnosis not present

## 2023-08-31 DIAGNOSIS — E669 Obesity, unspecified: Secondary | ICD-10-CM | POA: Diagnosis not present

## 2023-08-31 DIAGNOSIS — S72491D Other fracture of lower end of right femur, subsequent encounter for closed fracture with routine healing: Secondary | ICD-10-CM | POA: Diagnosis not present

## 2023-08-31 DIAGNOSIS — H4052X3 Glaucoma secondary to other eye disorders, left eye, severe stage: Secondary | ICD-10-CM | POA: Diagnosis not present

## 2023-08-31 DIAGNOSIS — K5900 Constipation, unspecified: Secondary | ICD-10-CM | POA: Diagnosis not present

## 2023-08-31 DIAGNOSIS — Z993 Dependence on wheelchair: Secondary | ICD-10-CM | POA: Diagnosis not present

## 2023-08-31 DIAGNOSIS — E785 Hyperlipidemia, unspecified: Secondary | ICD-10-CM | POA: Diagnosis not present

## 2023-08-31 DIAGNOSIS — S82852D Displaced trimalleolar fracture of left lower leg, subsequent encounter for closed fracture with routine healing: Secondary | ICD-10-CM | POA: Diagnosis not present

## 2023-08-31 DIAGNOSIS — I4891 Unspecified atrial fibrillation: Secondary | ICD-10-CM | POA: Diagnosis not present

## 2023-08-31 DIAGNOSIS — F411 Generalized anxiety disorder: Secondary | ICD-10-CM | POA: Diagnosis not present

## 2023-08-31 DIAGNOSIS — I110 Hypertensive heart disease with heart failure: Secondary | ICD-10-CM | POA: Diagnosis not present

## 2023-08-31 DIAGNOSIS — M6281 Muscle weakness (generalized): Secondary | ICD-10-CM | POA: Diagnosis not present

## 2023-08-31 DIAGNOSIS — M19042 Primary osteoarthritis, left hand: Secondary | ICD-10-CM | POA: Diagnosis not present

## 2023-08-31 DIAGNOSIS — I5032 Chronic diastolic (congestive) heart failure: Secondary | ICD-10-CM | POA: Diagnosis not present

## 2023-09-01 DIAGNOSIS — I4891 Unspecified atrial fibrillation: Secondary | ICD-10-CM | POA: Diagnosis not present

## 2023-09-01 DIAGNOSIS — K5900 Constipation, unspecified: Secondary | ICD-10-CM | POA: Diagnosis not present

## 2023-09-01 DIAGNOSIS — M6281 Muscle weakness (generalized): Secondary | ICD-10-CM | POA: Diagnosis not present

## 2023-09-01 DIAGNOSIS — M1712 Unilateral primary osteoarthritis, left knee: Secondary | ICD-10-CM | POA: Diagnosis not present

## 2023-09-01 DIAGNOSIS — Z993 Dependence on wheelchair: Secondary | ICD-10-CM | POA: Diagnosis not present

## 2023-09-01 DIAGNOSIS — I110 Hypertensive heart disease with heart failure: Secondary | ICD-10-CM | POA: Diagnosis not present

## 2023-09-01 DIAGNOSIS — S82852D Displaced trimalleolar fracture of left lower leg, subsequent encounter for closed fracture with routine healing: Secondary | ICD-10-CM | POA: Diagnosis not present

## 2023-09-01 DIAGNOSIS — E669 Obesity, unspecified: Secondary | ICD-10-CM | POA: Diagnosis not present

## 2023-09-01 DIAGNOSIS — E1065 Type 1 diabetes mellitus with hyperglycemia: Secondary | ICD-10-CM | POA: Diagnosis not present

## 2023-09-01 DIAGNOSIS — M8589 Other specified disorders of bone density and structure, multiple sites: Secondary | ICD-10-CM | POA: Diagnosis not present

## 2023-09-01 DIAGNOSIS — I5032 Chronic diastolic (congestive) heart failure: Secondary | ICD-10-CM | POA: Diagnosis not present

## 2023-09-01 DIAGNOSIS — M19042 Primary osteoarthritis, left hand: Secondary | ICD-10-CM | POA: Diagnosis not present

## 2023-09-01 DIAGNOSIS — F411 Generalized anxiety disorder: Secondary | ICD-10-CM | POA: Diagnosis not present

## 2023-09-01 DIAGNOSIS — E785 Hyperlipidemia, unspecified: Secondary | ICD-10-CM | POA: Diagnosis not present

## 2023-09-01 DIAGNOSIS — H4052X3 Glaucoma secondary to other eye disorders, left eye, severe stage: Secondary | ICD-10-CM | POA: Diagnosis not present

## 2023-09-01 DIAGNOSIS — S72491D Other fracture of lower end of right femur, subsequent encounter for closed fracture with routine healing: Secondary | ICD-10-CM | POA: Diagnosis not present

## 2023-09-03 DIAGNOSIS — F411 Generalized anxiety disorder: Secondary | ICD-10-CM | POA: Diagnosis not present

## 2023-09-03 DIAGNOSIS — Z993 Dependence on wheelchair: Secondary | ICD-10-CM | POA: Diagnosis not present

## 2023-09-03 DIAGNOSIS — S82852D Displaced trimalleolar fracture of left lower leg, subsequent encounter for closed fracture with routine healing: Secondary | ICD-10-CM | POA: Diagnosis not present

## 2023-09-03 DIAGNOSIS — E669 Obesity, unspecified: Secondary | ICD-10-CM | POA: Diagnosis not present

## 2023-09-03 DIAGNOSIS — M8589 Other specified disorders of bone density and structure, multiple sites: Secondary | ICD-10-CM | POA: Diagnosis not present

## 2023-09-03 DIAGNOSIS — M19042 Primary osteoarthritis, left hand: Secondary | ICD-10-CM | POA: Diagnosis not present

## 2023-09-03 DIAGNOSIS — K5900 Constipation, unspecified: Secondary | ICD-10-CM | POA: Diagnosis not present

## 2023-09-03 DIAGNOSIS — M1712 Unilateral primary osteoarthritis, left knee: Secondary | ICD-10-CM | POA: Diagnosis not present

## 2023-09-03 DIAGNOSIS — I4891 Unspecified atrial fibrillation: Secondary | ICD-10-CM | POA: Diagnosis not present

## 2023-09-03 DIAGNOSIS — H4052X3 Glaucoma secondary to other eye disorders, left eye, severe stage: Secondary | ICD-10-CM | POA: Diagnosis not present

## 2023-09-03 DIAGNOSIS — E785 Hyperlipidemia, unspecified: Secondary | ICD-10-CM | POA: Diagnosis not present

## 2023-09-03 DIAGNOSIS — M6281 Muscle weakness (generalized): Secondary | ICD-10-CM | POA: Diagnosis not present

## 2023-09-03 DIAGNOSIS — E1065 Type 1 diabetes mellitus with hyperglycemia: Secondary | ICD-10-CM | POA: Diagnosis not present

## 2023-09-03 DIAGNOSIS — I5032 Chronic diastolic (congestive) heart failure: Secondary | ICD-10-CM | POA: Diagnosis not present

## 2023-09-03 DIAGNOSIS — S72491D Other fracture of lower end of right femur, subsequent encounter for closed fracture with routine healing: Secondary | ICD-10-CM | POA: Diagnosis not present

## 2023-09-03 DIAGNOSIS — I110 Hypertensive heart disease with heart failure: Secondary | ICD-10-CM | POA: Diagnosis not present

## 2023-09-04 DIAGNOSIS — M79671 Pain in right foot: Secondary | ICD-10-CM | POA: Diagnosis not present

## 2023-09-04 DIAGNOSIS — M25512 Pain in left shoulder: Secondary | ICD-10-CM | POA: Diagnosis not present

## 2023-09-08 DIAGNOSIS — Z87442 Personal history of urinary calculi: Secondary | ICD-10-CM | POA: Diagnosis not present

## 2023-09-08 DIAGNOSIS — Z8616 Personal history of COVID-19: Secondary | ICD-10-CM | POA: Diagnosis not present

## 2023-09-08 DIAGNOSIS — S72491D Other fracture of lower end of right femur, subsequent encounter for closed fracture with routine healing: Secondary | ICD-10-CM | POA: Diagnosis not present

## 2023-09-08 DIAGNOSIS — K5900 Constipation, unspecified: Secondary | ICD-10-CM | POA: Diagnosis not present

## 2023-09-08 DIAGNOSIS — M1712 Unilateral primary osteoarthritis, left knee: Secondary | ICD-10-CM | POA: Diagnosis not present

## 2023-09-08 DIAGNOSIS — Z993 Dependence on wheelchair: Secondary | ICD-10-CM | POA: Diagnosis not present

## 2023-09-08 DIAGNOSIS — Z556 Problems related to health literacy: Secondary | ICD-10-CM | POA: Diagnosis not present

## 2023-09-08 DIAGNOSIS — I4891 Unspecified atrial fibrillation: Secondary | ICD-10-CM | POA: Diagnosis not present

## 2023-09-08 DIAGNOSIS — M8589 Other specified disorders of bone density and structure, multiple sites: Secondary | ICD-10-CM | POA: Diagnosis not present

## 2023-09-08 DIAGNOSIS — F411 Generalized anxiety disorder: Secondary | ICD-10-CM | POA: Diagnosis not present

## 2023-09-08 DIAGNOSIS — S82852D Displaced trimalleolar fracture of left lower leg, subsequent encounter for closed fracture with routine healing: Secondary | ICD-10-CM | POA: Diagnosis not present

## 2023-09-08 DIAGNOSIS — M19042 Primary osteoarthritis, left hand: Secondary | ICD-10-CM | POA: Diagnosis not present

## 2023-09-08 DIAGNOSIS — H4052X3 Glaucoma secondary to other eye disorders, left eye, severe stage: Secondary | ICD-10-CM | POA: Diagnosis not present

## 2023-09-08 DIAGNOSIS — E669 Obesity, unspecified: Secondary | ICD-10-CM | POA: Diagnosis not present

## 2023-09-08 DIAGNOSIS — Z8701 Personal history of pneumonia (recurrent): Secondary | ICD-10-CM | POA: Diagnosis not present

## 2023-09-08 DIAGNOSIS — E785 Hyperlipidemia, unspecified: Secondary | ICD-10-CM | POA: Diagnosis not present

## 2023-09-08 DIAGNOSIS — I110 Hypertensive heart disease with heart failure: Secondary | ICD-10-CM | POA: Diagnosis not present

## 2023-09-08 DIAGNOSIS — I5032 Chronic diastolic (congestive) heart failure: Secondary | ICD-10-CM | POA: Diagnosis not present

## 2023-09-08 DIAGNOSIS — E1065 Type 1 diabetes mellitus with hyperglycemia: Secondary | ICD-10-CM | POA: Diagnosis not present

## 2023-09-08 DIAGNOSIS — S81811D Laceration without foreign body, right lower leg, subsequent encounter: Secondary | ICD-10-CM | POA: Diagnosis not present

## 2023-09-10 DIAGNOSIS — K5900 Constipation, unspecified: Secondary | ICD-10-CM | POA: Diagnosis not present

## 2023-09-10 DIAGNOSIS — I5032 Chronic diastolic (congestive) heart failure: Secondary | ICD-10-CM | POA: Diagnosis not present

## 2023-09-10 DIAGNOSIS — S81811D Laceration without foreign body, right lower leg, subsequent encounter: Secondary | ICD-10-CM | POA: Diagnosis not present

## 2023-09-10 DIAGNOSIS — M1712 Unilateral primary osteoarthritis, left knee: Secondary | ICD-10-CM | POA: Diagnosis not present

## 2023-09-10 DIAGNOSIS — S82852D Displaced trimalleolar fracture of left lower leg, subsequent encounter for closed fracture with routine healing: Secondary | ICD-10-CM | POA: Diagnosis not present

## 2023-09-10 DIAGNOSIS — Z993 Dependence on wheelchair: Secondary | ICD-10-CM | POA: Diagnosis not present

## 2023-09-10 DIAGNOSIS — M8589 Other specified disorders of bone density and structure, multiple sites: Secondary | ICD-10-CM | POA: Diagnosis not present

## 2023-09-10 DIAGNOSIS — I4891 Unspecified atrial fibrillation: Secondary | ICD-10-CM | POA: Diagnosis not present

## 2023-09-10 DIAGNOSIS — I110 Hypertensive heart disease with heart failure: Secondary | ICD-10-CM | POA: Diagnosis not present

## 2023-09-10 DIAGNOSIS — S72491D Other fracture of lower end of right femur, subsequent encounter for closed fracture with routine healing: Secondary | ICD-10-CM | POA: Diagnosis not present

## 2023-09-10 DIAGNOSIS — E1065 Type 1 diabetes mellitus with hyperglycemia: Secondary | ICD-10-CM | POA: Diagnosis not present

## 2023-09-10 DIAGNOSIS — E669 Obesity, unspecified: Secondary | ICD-10-CM | POA: Diagnosis not present

## 2023-09-10 DIAGNOSIS — F411 Generalized anxiety disorder: Secondary | ICD-10-CM | POA: Diagnosis not present

## 2023-09-10 DIAGNOSIS — E785 Hyperlipidemia, unspecified: Secondary | ICD-10-CM | POA: Diagnosis not present

## 2023-09-10 DIAGNOSIS — M19042 Primary osteoarthritis, left hand: Secondary | ICD-10-CM | POA: Diagnosis not present

## 2023-09-10 DIAGNOSIS — H4052X3 Glaucoma secondary to other eye disorders, left eye, severe stage: Secondary | ICD-10-CM | POA: Diagnosis not present

## 2023-09-14 DIAGNOSIS — H4052X3 Glaucoma secondary to other eye disorders, left eye, severe stage: Secondary | ICD-10-CM | POA: Diagnosis not present

## 2023-09-14 DIAGNOSIS — K5900 Constipation, unspecified: Secondary | ICD-10-CM | POA: Diagnosis not present

## 2023-09-14 DIAGNOSIS — E785 Hyperlipidemia, unspecified: Secondary | ICD-10-CM | POA: Diagnosis not present

## 2023-09-14 DIAGNOSIS — S72491D Other fracture of lower end of right femur, subsequent encounter for closed fracture with routine healing: Secondary | ICD-10-CM | POA: Diagnosis not present

## 2023-09-14 DIAGNOSIS — S82852D Displaced trimalleolar fracture of left lower leg, subsequent encounter for closed fracture with routine healing: Secondary | ICD-10-CM | POA: Diagnosis not present

## 2023-09-14 DIAGNOSIS — Z993 Dependence on wheelchair: Secondary | ICD-10-CM | POA: Diagnosis not present

## 2023-09-14 DIAGNOSIS — M8589 Other specified disorders of bone density and structure, multiple sites: Secondary | ICD-10-CM | POA: Diagnosis not present

## 2023-09-14 DIAGNOSIS — I4891 Unspecified atrial fibrillation: Secondary | ICD-10-CM | POA: Diagnosis not present

## 2023-09-14 DIAGNOSIS — S81811D Laceration without foreign body, right lower leg, subsequent encounter: Secondary | ICD-10-CM | POA: Diagnosis not present

## 2023-09-14 DIAGNOSIS — I110 Hypertensive heart disease with heart failure: Secondary | ICD-10-CM | POA: Diagnosis not present

## 2023-09-14 DIAGNOSIS — M1712 Unilateral primary osteoarthritis, left knee: Secondary | ICD-10-CM | POA: Diagnosis not present

## 2023-09-14 DIAGNOSIS — F411 Generalized anxiety disorder: Secondary | ICD-10-CM | POA: Diagnosis not present

## 2023-09-14 DIAGNOSIS — E1065 Type 1 diabetes mellitus with hyperglycemia: Secondary | ICD-10-CM | POA: Diagnosis not present

## 2023-09-14 DIAGNOSIS — M19042 Primary osteoarthritis, left hand: Secondary | ICD-10-CM | POA: Diagnosis not present

## 2023-09-14 DIAGNOSIS — I5032 Chronic diastolic (congestive) heart failure: Secondary | ICD-10-CM | POA: Diagnosis not present

## 2023-09-14 DIAGNOSIS — E669 Obesity, unspecified: Secondary | ICD-10-CM | POA: Diagnosis not present

## 2023-09-16 DIAGNOSIS — I4891 Unspecified atrial fibrillation: Secondary | ICD-10-CM | POA: Diagnosis not present

## 2023-09-16 DIAGNOSIS — F411 Generalized anxiety disorder: Secondary | ICD-10-CM | POA: Diagnosis not present

## 2023-09-16 DIAGNOSIS — E785 Hyperlipidemia, unspecified: Secondary | ICD-10-CM | POA: Diagnosis not present

## 2023-09-16 DIAGNOSIS — Z993 Dependence on wheelchair: Secondary | ICD-10-CM | POA: Diagnosis not present

## 2023-09-16 DIAGNOSIS — S72491D Other fracture of lower end of right femur, subsequent encounter for closed fracture with routine healing: Secondary | ICD-10-CM | POA: Diagnosis not present

## 2023-09-16 DIAGNOSIS — K5900 Constipation, unspecified: Secondary | ICD-10-CM | POA: Diagnosis not present

## 2023-09-16 DIAGNOSIS — S82852D Displaced trimalleolar fracture of left lower leg, subsequent encounter for closed fracture with routine healing: Secondary | ICD-10-CM | POA: Diagnosis not present

## 2023-09-16 DIAGNOSIS — M8589 Other specified disorders of bone density and structure, multiple sites: Secondary | ICD-10-CM | POA: Diagnosis not present

## 2023-09-16 DIAGNOSIS — I5032 Chronic diastolic (congestive) heart failure: Secondary | ICD-10-CM | POA: Diagnosis not present

## 2023-09-16 DIAGNOSIS — E669 Obesity, unspecified: Secondary | ICD-10-CM | POA: Diagnosis not present

## 2023-09-16 DIAGNOSIS — E1065 Type 1 diabetes mellitus with hyperglycemia: Secondary | ICD-10-CM | POA: Diagnosis not present

## 2023-09-16 DIAGNOSIS — M19042 Primary osteoarthritis, left hand: Secondary | ICD-10-CM | POA: Diagnosis not present

## 2023-09-16 DIAGNOSIS — I110 Hypertensive heart disease with heart failure: Secondary | ICD-10-CM | POA: Diagnosis not present

## 2023-09-16 DIAGNOSIS — S81811D Laceration without foreign body, right lower leg, subsequent encounter: Secondary | ICD-10-CM | POA: Diagnosis not present

## 2023-09-16 DIAGNOSIS — H4052X3 Glaucoma secondary to other eye disorders, left eye, severe stage: Secondary | ICD-10-CM | POA: Diagnosis not present

## 2023-09-16 DIAGNOSIS — M1712 Unilateral primary osteoarthritis, left knee: Secondary | ICD-10-CM | POA: Diagnosis not present

## 2023-09-17 DIAGNOSIS — S81811D Laceration without foreign body, right lower leg, subsequent encounter: Secondary | ICD-10-CM | POA: Diagnosis not present

## 2023-09-17 DIAGNOSIS — F411 Generalized anxiety disorder: Secondary | ICD-10-CM | POA: Diagnosis not present

## 2023-09-17 DIAGNOSIS — S72491D Other fracture of lower end of right femur, subsequent encounter for closed fracture with routine healing: Secondary | ICD-10-CM | POA: Diagnosis not present

## 2023-09-17 DIAGNOSIS — E1065 Type 1 diabetes mellitus with hyperglycemia: Secondary | ICD-10-CM | POA: Diagnosis not present

## 2023-09-17 DIAGNOSIS — I110 Hypertensive heart disease with heart failure: Secondary | ICD-10-CM | POA: Diagnosis not present

## 2023-09-17 DIAGNOSIS — M1712 Unilateral primary osteoarthritis, left knee: Secondary | ICD-10-CM | POA: Diagnosis not present

## 2023-09-17 DIAGNOSIS — I5032 Chronic diastolic (congestive) heart failure: Secondary | ICD-10-CM | POA: Diagnosis not present

## 2023-09-17 DIAGNOSIS — K5900 Constipation, unspecified: Secondary | ICD-10-CM | POA: Diagnosis not present

## 2023-09-17 DIAGNOSIS — I4891 Unspecified atrial fibrillation: Secondary | ICD-10-CM | POA: Diagnosis not present

## 2023-09-17 DIAGNOSIS — M8589 Other specified disorders of bone density and structure, multiple sites: Secondary | ICD-10-CM | POA: Diagnosis not present

## 2023-09-17 DIAGNOSIS — M19042 Primary osteoarthritis, left hand: Secondary | ICD-10-CM | POA: Diagnosis not present

## 2023-09-17 DIAGNOSIS — S82852D Displaced trimalleolar fracture of left lower leg, subsequent encounter for closed fracture with routine healing: Secondary | ICD-10-CM | POA: Diagnosis not present

## 2023-09-17 DIAGNOSIS — H4052X3 Glaucoma secondary to other eye disorders, left eye, severe stage: Secondary | ICD-10-CM | POA: Diagnosis not present

## 2023-09-17 DIAGNOSIS — E669 Obesity, unspecified: Secondary | ICD-10-CM | POA: Diagnosis not present

## 2023-09-17 DIAGNOSIS — Z993 Dependence on wheelchair: Secondary | ICD-10-CM | POA: Diagnosis not present

## 2023-09-17 DIAGNOSIS — E785 Hyperlipidemia, unspecified: Secondary | ICD-10-CM | POA: Diagnosis not present

## 2023-09-20 DIAGNOSIS — M19012 Primary osteoarthritis, left shoulder: Secondary | ICD-10-CM | POA: Diagnosis not present

## 2023-09-22 DIAGNOSIS — K5900 Constipation, unspecified: Secondary | ICD-10-CM | POA: Diagnosis not present

## 2023-09-22 DIAGNOSIS — M19042 Primary osteoarthritis, left hand: Secondary | ICD-10-CM | POA: Diagnosis not present

## 2023-09-22 DIAGNOSIS — S82852D Displaced trimalleolar fracture of left lower leg, subsequent encounter for closed fracture with routine healing: Secondary | ICD-10-CM | POA: Diagnosis not present

## 2023-09-22 DIAGNOSIS — I5032 Chronic diastolic (congestive) heart failure: Secondary | ICD-10-CM | POA: Diagnosis not present

## 2023-09-22 DIAGNOSIS — F411 Generalized anxiety disorder: Secondary | ICD-10-CM | POA: Diagnosis not present

## 2023-09-22 DIAGNOSIS — E1065 Type 1 diabetes mellitus with hyperglycemia: Secondary | ICD-10-CM | POA: Diagnosis not present

## 2023-09-22 DIAGNOSIS — S81811D Laceration without foreign body, right lower leg, subsequent encounter: Secondary | ICD-10-CM | POA: Diagnosis not present

## 2023-09-22 DIAGNOSIS — M1712 Unilateral primary osteoarthritis, left knee: Secondary | ICD-10-CM | POA: Diagnosis not present

## 2023-09-22 DIAGNOSIS — S72491D Other fracture of lower end of right femur, subsequent encounter for closed fracture with routine healing: Secondary | ICD-10-CM | POA: Diagnosis not present

## 2023-09-22 DIAGNOSIS — I4891 Unspecified atrial fibrillation: Secondary | ICD-10-CM | POA: Diagnosis not present

## 2023-09-22 DIAGNOSIS — E785 Hyperlipidemia, unspecified: Secondary | ICD-10-CM | POA: Diagnosis not present

## 2023-09-22 DIAGNOSIS — M8589 Other specified disorders of bone density and structure, multiple sites: Secondary | ICD-10-CM | POA: Diagnosis not present

## 2023-09-22 DIAGNOSIS — E669 Obesity, unspecified: Secondary | ICD-10-CM | POA: Diagnosis not present

## 2023-09-22 DIAGNOSIS — I110 Hypertensive heart disease with heart failure: Secondary | ICD-10-CM | POA: Diagnosis not present

## 2023-09-22 DIAGNOSIS — Z993 Dependence on wheelchair: Secondary | ICD-10-CM | POA: Diagnosis not present

## 2023-09-22 DIAGNOSIS — H4052X3 Glaucoma secondary to other eye disorders, left eye, severe stage: Secondary | ICD-10-CM | POA: Diagnosis not present

## 2023-09-23 DIAGNOSIS — I110 Hypertensive heart disease with heart failure: Secondary | ICD-10-CM | POA: Diagnosis not present

## 2023-09-23 DIAGNOSIS — M19042 Primary osteoarthritis, left hand: Secondary | ICD-10-CM | POA: Diagnosis not present

## 2023-09-23 DIAGNOSIS — E1065 Type 1 diabetes mellitus with hyperglycemia: Secondary | ICD-10-CM | POA: Diagnosis not present

## 2023-09-23 DIAGNOSIS — K5900 Constipation, unspecified: Secondary | ICD-10-CM | POA: Diagnosis not present

## 2023-09-23 DIAGNOSIS — E669 Obesity, unspecified: Secondary | ICD-10-CM | POA: Diagnosis not present

## 2023-09-23 DIAGNOSIS — S82852D Displaced trimalleolar fracture of left lower leg, subsequent encounter for closed fracture with routine healing: Secondary | ICD-10-CM | POA: Diagnosis not present

## 2023-09-23 DIAGNOSIS — Z993 Dependence on wheelchair: Secondary | ICD-10-CM | POA: Diagnosis not present

## 2023-09-23 DIAGNOSIS — M8589 Other specified disorders of bone density and structure, multiple sites: Secondary | ICD-10-CM | POA: Diagnosis not present

## 2023-09-23 DIAGNOSIS — H4052X3 Glaucoma secondary to other eye disorders, left eye, severe stage: Secondary | ICD-10-CM | POA: Diagnosis not present

## 2023-09-23 DIAGNOSIS — I4891 Unspecified atrial fibrillation: Secondary | ICD-10-CM | POA: Diagnosis not present

## 2023-09-23 DIAGNOSIS — S72491D Other fracture of lower end of right femur, subsequent encounter for closed fracture with routine healing: Secondary | ICD-10-CM | POA: Diagnosis not present

## 2023-09-23 DIAGNOSIS — E785 Hyperlipidemia, unspecified: Secondary | ICD-10-CM | POA: Diagnosis not present

## 2023-09-23 DIAGNOSIS — S81811D Laceration without foreign body, right lower leg, subsequent encounter: Secondary | ICD-10-CM | POA: Diagnosis not present

## 2023-09-23 DIAGNOSIS — I5032 Chronic diastolic (congestive) heart failure: Secondary | ICD-10-CM | POA: Diagnosis not present

## 2023-09-23 DIAGNOSIS — M1712 Unilateral primary osteoarthritis, left knee: Secondary | ICD-10-CM | POA: Diagnosis not present

## 2023-09-23 DIAGNOSIS — F411 Generalized anxiety disorder: Secondary | ICD-10-CM | POA: Diagnosis not present

## 2023-09-24 DIAGNOSIS — H4052X3 Glaucoma secondary to other eye disorders, left eye, severe stage: Secondary | ICD-10-CM | POA: Diagnosis not present

## 2023-09-24 DIAGNOSIS — M1712 Unilateral primary osteoarthritis, left knee: Secondary | ICD-10-CM | POA: Diagnosis not present

## 2023-09-24 DIAGNOSIS — M8589 Other specified disorders of bone density and structure, multiple sites: Secondary | ICD-10-CM | POA: Diagnosis not present

## 2023-09-24 DIAGNOSIS — E669 Obesity, unspecified: Secondary | ICD-10-CM | POA: Diagnosis not present

## 2023-09-24 DIAGNOSIS — E785 Hyperlipidemia, unspecified: Secondary | ICD-10-CM | POA: Diagnosis not present

## 2023-09-24 DIAGNOSIS — E1065 Type 1 diabetes mellitus with hyperglycemia: Secondary | ICD-10-CM | POA: Diagnosis not present

## 2023-09-24 DIAGNOSIS — Z993 Dependence on wheelchair: Secondary | ICD-10-CM | POA: Diagnosis not present

## 2023-09-24 DIAGNOSIS — S81811D Laceration without foreign body, right lower leg, subsequent encounter: Secondary | ICD-10-CM | POA: Diagnosis not present

## 2023-09-24 DIAGNOSIS — S82852D Displaced trimalleolar fracture of left lower leg, subsequent encounter for closed fracture with routine healing: Secondary | ICD-10-CM | POA: Diagnosis not present

## 2023-09-24 DIAGNOSIS — I4891 Unspecified atrial fibrillation: Secondary | ICD-10-CM | POA: Diagnosis not present

## 2023-09-24 DIAGNOSIS — M19042 Primary osteoarthritis, left hand: Secondary | ICD-10-CM | POA: Diagnosis not present

## 2023-09-24 DIAGNOSIS — K5900 Constipation, unspecified: Secondary | ICD-10-CM | POA: Diagnosis not present

## 2023-09-24 DIAGNOSIS — I5032 Chronic diastolic (congestive) heart failure: Secondary | ICD-10-CM | POA: Diagnosis not present

## 2023-09-24 DIAGNOSIS — I110 Hypertensive heart disease with heart failure: Secondary | ICD-10-CM | POA: Diagnosis not present

## 2023-09-24 DIAGNOSIS — F411 Generalized anxiety disorder: Secondary | ICD-10-CM | POA: Diagnosis not present

## 2023-09-24 DIAGNOSIS — S72491D Other fracture of lower end of right femur, subsequent encounter for closed fracture with routine healing: Secondary | ICD-10-CM | POA: Diagnosis not present

## 2023-09-24 DIAGNOSIS — R851 Abnormal level of hormones in specimens from digestive organs and abdominal cavity: Secondary | ICD-10-CM | POA: Diagnosis not present

## 2023-09-30 DIAGNOSIS — M8589 Other specified disorders of bone density and structure, multiple sites: Secondary | ICD-10-CM | POA: Diagnosis not present

## 2023-09-30 DIAGNOSIS — M19042 Primary osteoarthritis, left hand: Secondary | ICD-10-CM | POA: Diagnosis not present

## 2023-09-30 DIAGNOSIS — I4891 Unspecified atrial fibrillation: Secondary | ICD-10-CM | POA: Diagnosis not present

## 2023-09-30 DIAGNOSIS — S81811D Laceration without foreign body, right lower leg, subsequent encounter: Secondary | ICD-10-CM | POA: Diagnosis not present

## 2023-09-30 DIAGNOSIS — E669 Obesity, unspecified: Secondary | ICD-10-CM | POA: Diagnosis not present

## 2023-09-30 DIAGNOSIS — E1065 Type 1 diabetes mellitus with hyperglycemia: Secondary | ICD-10-CM | POA: Diagnosis not present

## 2023-09-30 DIAGNOSIS — K5900 Constipation, unspecified: Secondary | ICD-10-CM | POA: Diagnosis not present

## 2023-09-30 DIAGNOSIS — Z993 Dependence on wheelchair: Secondary | ICD-10-CM | POA: Diagnosis not present

## 2023-09-30 DIAGNOSIS — H4052X3 Glaucoma secondary to other eye disorders, left eye, severe stage: Secondary | ICD-10-CM | POA: Diagnosis not present

## 2023-09-30 DIAGNOSIS — E785 Hyperlipidemia, unspecified: Secondary | ICD-10-CM | POA: Diagnosis not present

## 2023-09-30 DIAGNOSIS — F411 Generalized anxiety disorder: Secondary | ICD-10-CM | POA: Diagnosis not present

## 2023-09-30 DIAGNOSIS — I5032 Chronic diastolic (congestive) heart failure: Secondary | ICD-10-CM | POA: Diagnosis not present

## 2023-09-30 DIAGNOSIS — S82852D Displaced trimalleolar fracture of left lower leg, subsequent encounter for closed fracture with routine healing: Secondary | ICD-10-CM | POA: Diagnosis not present

## 2023-09-30 DIAGNOSIS — M1712 Unilateral primary osteoarthritis, left knee: Secondary | ICD-10-CM | POA: Diagnosis not present

## 2023-09-30 DIAGNOSIS — I110 Hypertensive heart disease with heart failure: Secondary | ICD-10-CM | POA: Diagnosis not present

## 2023-09-30 DIAGNOSIS — S72491D Other fracture of lower end of right femur, subsequent encounter for closed fracture with routine healing: Secondary | ICD-10-CM | POA: Diagnosis not present

## 2023-10-01 DIAGNOSIS — F411 Generalized anxiety disorder: Secondary | ICD-10-CM | POA: Diagnosis not present

## 2023-10-01 DIAGNOSIS — Z993 Dependence on wheelchair: Secondary | ICD-10-CM | POA: Diagnosis not present

## 2023-10-01 DIAGNOSIS — M1712 Unilateral primary osteoarthritis, left knee: Secondary | ICD-10-CM | POA: Diagnosis not present

## 2023-10-01 DIAGNOSIS — I5032 Chronic diastolic (congestive) heart failure: Secondary | ICD-10-CM | POA: Diagnosis not present

## 2023-10-01 DIAGNOSIS — I4891 Unspecified atrial fibrillation: Secondary | ICD-10-CM | POA: Diagnosis not present

## 2023-10-01 DIAGNOSIS — M8589 Other specified disorders of bone density and structure, multiple sites: Secondary | ICD-10-CM | POA: Diagnosis not present

## 2023-10-01 DIAGNOSIS — I110 Hypertensive heart disease with heart failure: Secondary | ICD-10-CM | POA: Diagnosis not present

## 2023-10-01 DIAGNOSIS — E1065 Type 1 diabetes mellitus with hyperglycemia: Secondary | ICD-10-CM | POA: Diagnosis not present

## 2023-10-01 DIAGNOSIS — E669 Obesity, unspecified: Secondary | ICD-10-CM | POA: Diagnosis not present

## 2023-10-01 DIAGNOSIS — S72491D Other fracture of lower end of right femur, subsequent encounter for closed fracture with routine healing: Secondary | ICD-10-CM | POA: Diagnosis not present

## 2023-10-01 DIAGNOSIS — S81811D Laceration without foreign body, right lower leg, subsequent encounter: Secondary | ICD-10-CM | POA: Diagnosis not present

## 2023-10-01 DIAGNOSIS — S82852D Displaced trimalleolar fracture of left lower leg, subsequent encounter for closed fracture with routine healing: Secondary | ICD-10-CM | POA: Diagnosis not present

## 2023-10-01 DIAGNOSIS — E785 Hyperlipidemia, unspecified: Secondary | ICD-10-CM | POA: Diagnosis not present

## 2023-10-01 DIAGNOSIS — M19042 Primary osteoarthritis, left hand: Secondary | ICD-10-CM | POA: Diagnosis not present

## 2023-10-01 DIAGNOSIS — H4052X3 Glaucoma secondary to other eye disorders, left eye, severe stage: Secondary | ICD-10-CM | POA: Diagnosis not present

## 2023-10-01 DIAGNOSIS — K5900 Constipation, unspecified: Secondary | ICD-10-CM | POA: Diagnosis not present

## 2023-10-04 DIAGNOSIS — M1712 Unilateral primary osteoarthritis, left knee: Secondary | ICD-10-CM | POA: Diagnosis not present

## 2023-10-04 DIAGNOSIS — I5032 Chronic diastolic (congestive) heart failure: Secondary | ICD-10-CM | POA: Diagnosis not present

## 2023-10-04 DIAGNOSIS — M8589 Other specified disorders of bone density and structure, multiple sites: Secondary | ICD-10-CM | POA: Diagnosis not present

## 2023-10-04 DIAGNOSIS — E669 Obesity, unspecified: Secondary | ICD-10-CM | POA: Diagnosis not present

## 2023-10-04 DIAGNOSIS — E785 Hyperlipidemia, unspecified: Secondary | ICD-10-CM | POA: Diagnosis not present

## 2023-10-04 DIAGNOSIS — K5900 Constipation, unspecified: Secondary | ICD-10-CM | POA: Diagnosis not present

## 2023-10-04 DIAGNOSIS — Z993 Dependence on wheelchair: Secondary | ICD-10-CM | POA: Diagnosis not present

## 2023-10-04 DIAGNOSIS — H4052X3 Glaucoma secondary to other eye disorders, left eye, severe stage: Secondary | ICD-10-CM | POA: Diagnosis not present

## 2023-10-04 DIAGNOSIS — M19042 Primary osteoarthritis, left hand: Secondary | ICD-10-CM | POA: Diagnosis not present

## 2023-10-04 DIAGNOSIS — S81811D Laceration without foreign body, right lower leg, subsequent encounter: Secondary | ICD-10-CM | POA: Diagnosis not present

## 2023-10-04 DIAGNOSIS — I110 Hypertensive heart disease with heart failure: Secondary | ICD-10-CM | POA: Diagnosis not present

## 2023-10-04 DIAGNOSIS — S82852D Displaced trimalleolar fracture of left lower leg, subsequent encounter for closed fracture with routine healing: Secondary | ICD-10-CM | POA: Diagnosis not present

## 2023-10-04 DIAGNOSIS — E1065 Type 1 diabetes mellitus with hyperglycemia: Secondary | ICD-10-CM | POA: Diagnosis not present

## 2023-10-04 DIAGNOSIS — F411 Generalized anxiety disorder: Secondary | ICD-10-CM | POA: Diagnosis not present

## 2023-10-04 DIAGNOSIS — I4891 Unspecified atrial fibrillation: Secondary | ICD-10-CM | POA: Diagnosis not present

## 2023-10-04 DIAGNOSIS — S72491D Other fracture of lower end of right femur, subsequent encounter for closed fracture with routine healing: Secondary | ICD-10-CM | POA: Diagnosis not present

## 2023-10-05 DIAGNOSIS — E785 Hyperlipidemia, unspecified: Secondary | ICD-10-CM | POA: Diagnosis not present

## 2023-10-05 DIAGNOSIS — E1065 Type 1 diabetes mellitus with hyperglycemia: Secondary | ICD-10-CM | POA: Diagnosis not present

## 2023-10-05 DIAGNOSIS — M19042 Primary osteoarthritis, left hand: Secondary | ICD-10-CM | POA: Diagnosis not present

## 2023-10-05 DIAGNOSIS — M1712 Unilateral primary osteoarthritis, left knee: Secondary | ICD-10-CM | POA: Diagnosis not present

## 2023-10-05 DIAGNOSIS — S82852D Displaced trimalleolar fracture of left lower leg, subsequent encounter for closed fracture with routine healing: Secondary | ICD-10-CM | POA: Diagnosis not present

## 2023-10-05 DIAGNOSIS — S81811D Laceration without foreign body, right lower leg, subsequent encounter: Secondary | ICD-10-CM | POA: Diagnosis not present

## 2023-10-05 DIAGNOSIS — S72401D Unspecified fracture of lower end of right femur, subsequent encounter for closed fracture with routine healing: Secondary | ICD-10-CM | POA: Diagnosis not present

## 2023-10-05 DIAGNOSIS — Z993 Dependence on wheelchair: Secondary | ICD-10-CM | POA: Diagnosis not present

## 2023-10-05 DIAGNOSIS — M8589 Other specified disorders of bone density and structure, multiple sites: Secondary | ICD-10-CM | POA: Diagnosis not present

## 2023-10-05 DIAGNOSIS — F411 Generalized anxiety disorder: Secondary | ICD-10-CM | POA: Diagnosis not present

## 2023-10-05 DIAGNOSIS — H4052X3 Glaucoma secondary to other eye disorders, left eye, severe stage: Secondary | ICD-10-CM | POA: Diagnosis not present

## 2023-10-05 DIAGNOSIS — S72491D Other fracture of lower end of right femur, subsequent encounter for closed fracture with routine healing: Secondary | ICD-10-CM | POA: Diagnosis not present

## 2023-10-05 DIAGNOSIS — I110 Hypertensive heart disease with heart failure: Secondary | ICD-10-CM | POA: Diagnosis not present

## 2023-10-05 DIAGNOSIS — E669 Obesity, unspecified: Secondary | ICD-10-CM | POA: Diagnosis not present

## 2023-10-05 DIAGNOSIS — K5900 Constipation, unspecified: Secondary | ICD-10-CM | POA: Diagnosis not present

## 2023-10-05 DIAGNOSIS — I5032 Chronic diastolic (congestive) heart failure: Secondary | ICD-10-CM | POA: Diagnosis not present

## 2023-10-05 DIAGNOSIS — I4891 Unspecified atrial fibrillation: Secondary | ICD-10-CM | POA: Diagnosis not present

## 2023-10-06 DIAGNOSIS — Z8701 Personal history of pneumonia (recurrent): Secondary | ICD-10-CM | POA: Diagnosis not present

## 2023-10-06 DIAGNOSIS — M8589 Other specified disorders of bone density and structure, multiple sites: Secondary | ICD-10-CM | POA: Diagnosis not present

## 2023-10-06 DIAGNOSIS — E669 Obesity, unspecified: Secondary | ICD-10-CM | POA: Diagnosis not present

## 2023-10-06 DIAGNOSIS — F411 Generalized anxiety disorder: Secondary | ICD-10-CM | POA: Diagnosis not present

## 2023-10-06 DIAGNOSIS — Z993 Dependence on wheelchair: Secondary | ICD-10-CM | POA: Diagnosis not present

## 2023-10-06 DIAGNOSIS — Z87442 Personal history of urinary calculi: Secondary | ICD-10-CM | POA: Diagnosis not present

## 2023-10-06 DIAGNOSIS — I110 Hypertensive heart disease with heart failure: Secondary | ICD-10-CM | POA: Diagnosis not present

## 2023-10-06 DIAGNOSIS — I5032 Chronic diastolic (congestive) heart failure: Secondary | ICD-10-CM | POA: Diagnosis not present

## 2023-10-06 DIAGNOSIS — E1065 Type 1 diabetes mellitus with hyperglycemia: Secondary | ICD-10-CM | POA: Diagnosis not present

## 2023-10-06 DIAGNOSIS — M1712 Unilateral primary osteoarthritis, left knee: Secondary | ICD-10-CM | POA: Diagnosis not present

## 2023-10-06 DIAGNOSIS — Z556 Problems related to health literacy: Secondary | ICD-10-CM | POA: Diagnosis not present

## 2023-10-06 DIAGNOSIS — I4891 Unspecified atrial fibrillation: Secondary | ICD-10-CM | POA: Diagnosis not present

## 2023-10-06 DIAGNOSIS — I739 Peripheral vascular disease, unspecified: Secondary | ICD-10-CM | POA: Diagnosis not present

## 2023-10-06 DIAGNOSIS — H4052X3 Glaucoma secondary to other eye disorders, left eye, severe stage: Secondary | ICD-10-CM | POA: Diagnosis not present

## 2023-10-06 DIAGNOSIS — K5900 Constipation, unspecified: Secondary | ICD-10-CM | POA: Diagnosis not present

## 2023-10-06 DIAGNOSIS — E785 Hyperlipidemia, unspecified: Secondary | ICD-10-CM | POA: Diagnosis not present

## 2023-10-06 DIAGNOSIS — S81811D Laceration without foreign body, right lower leg, subsequent encounter: Secondary | ICD-10-CM | POA: Diagnosis not present

## 2023-10-06 DIAGNOSIS — Z8616 Personal history of COVID-19: Secondary | ICD-10-CM | POA: Diagnosis not present

## 2023-10-06 DIAGNOSIS — S72491D Other fracture of lower end of right femur, subsequent encounter for closed fracture with routine healing: Secondary | ICD-10-CM | POA: Diagnosis not present

## 2023-10-06 DIAGNOSIS — S82852D Displaced trimalleolar fracture of left lower leg, subsequent encounter for closed fracture with routine healing: Secondary | ICD-10-CM | POA: Diagnosis not present

## 2023-10-06 DIAGNOSIS — M19042 Primary osteoarthritis, left hand: Secondary | ICD-10-CM | POA: Diagnosis not present

## 2023-10-07 DIAGNOSIS — N390 Urinary tract infection, site not specified: Secondary | ICD-10-CM | POA: Diagnosis not present

## 2023-10-11 DIAGNOSIS — M8589 Other specified disorders of bone density and structure, multiple sites: Secondary | ICD-10-CM | POA: Diagnosis not present

## 2023-10-11 DIAGNOSIS — I5032 Chronic diastolic (congestive) heart failure: Secondary | ICD-10-CM | POA: Diagnosis not present

## 2023-10-11 DIAGNOSIS — E785 Hyperlipidemia, unspecified: Secondary | ICD-10-CM | POA: Diagnosis not present

## 2023-10-11 DIAGNOSIS — M1712 Unilateral primary osteoarthritis, left knee: Secondary | ICD-10-CM | POA: Diagnosis not present

## 2023-10-11 DIAGNOSIS — K5900 Constipation, unspecified: Secondary | ICD-10-CM | POA: Diagnosis not present

## 2023-10-11 DIAGNOSIS — S81811D Laceration without foreign body, right lower leg, subsequent encounter: Secondary | ICD-10-CM | POA: Diagnosis not present

## 2023-10-11 DIAGNOSIS — I110 Hypertensive heart disease with heart failure: Secondary | ICD-10-CM | POA: Diagnosis not present

## 2023-10-11 DIAGNOSIS — E669 Obesity, unspecified: Secondary | ICD-10-CM | POA: Diagnosis not present

## 2023-10-11 DIAGNOSIS — E1065 Type 1 diabetes mellitus with hyperglycemia: Secondary | ICD-10-CM | POA: Diagnosis not present

## 2023-10-11 DIAGNOSIS — S82852D Displaced trimalleolar fracture of left lower leg, subsequent encounter for closed fracture with routine healing: Secondary | ICD-10-CM | POA: Diagnosis not present

## 2023-10-11 DIAGNOSIS — S72491D Other fracture of lower end of right femur, subsequent encounter for closed fracture with routine healing: Secondary | ICD-10-CM | POA: Diagnosis not present

## 2023-10-11 DIAGNOSIS — Z993 Dependence on wheelchair: Secondary | ICD-10-CM | POA: Diagnosis not present

## 2023-10-11 DIAGNOSIS — H4052X3 Glaucoma secondary to other eye disorders, left eye, severe stage: Secondary | ICD-10-CM | POA: Diagnosis not present

## 2023-10-11 DIAGNOSIS — F411 Generalized anxiety disorder: Secondary | ICD-10-CM | POA: Diagnosis not present

## 2023-10-11 DIAGNOSIS — M19042 Primary osteoarthritis, left hand: Secondary | ICD-10-CM | POA: Diagnosis not present

## 2023-10-11 DIAGNOSIS — I4891 Unspecified atrial fibrillation: Secondary | ICD-10-CM | POA: Diagnosis not present

## 2023-10-12 DIAGNOSIS — H4052X3 Glaucoma secondary to other eye disorders, left eye, severe stage: Secondary | ICD-10-CM | POA: Diagnosis not present

## 2023-10-12 DIAGNOSIS — K5900 Constipation, unspecified: Secondary | ICD-10-CM | POA: Diagnosis not present

## 2023-10-12 DIAGNOSIS — S81811D Laceration without foreign body, right lower leg, subsequent encounter: Secondary | ICD-10-CM | POA: Diagnosis not present

## 2023-10-12 DIAGNOSIS — M8589 Other specified disorders of bone density and structure, multiple sites: Secondary | ICD-10-CM | POA: Diagnosis not present

## 2023-10-12 DIAGNOSIS — S82852D Displaced trimalleolar fracture of left lower leg, subsequent encounter for closed fracture with routine healing: Secondary | ICD-10-CM | POA: Diagnosis not present

## 2023-10-12 DIAGNOSIS — E669 Obesity, unspecified: Secondary | ICD-10-CM | POA: Diagnosis not present

## 2023-10-12 DIAGNOSIS — E1065 Type 1 diabetes mellitus with hyperglycemia: Secondary | ICD-10-CM | POA: Diagnosis not present

## 2023-10-12 DIAGNOSIS — F411 Generalized anxiety disorder: Secondary | ICD-10-CM | POA: Diagnosis not present

## 2023-10-12 DIAGNOSIS — M1712 Unilateral primary osteoarthritis, left knee: Secondary | ICD-10-CM | POA: Diagnosis not present

## 2023-10-12 DIAGNOSIS — S72491D Other fracture of lower end of right femur, subsequent encounter for closed fracture with routine healing: Secondary | ICD-10-CM | POA: Diagnosis not present

## 2023-10-12 DIAGNOSIS — M19042 Primary osteoarthritis, left hand: Secondary | ICD-10-CM | POA: Diagnosis not present

## 2023-10-12 DIAGNOSIS — Z993 Dependence on wheelchair: Secondary | ICD-10-CM | POA: Diagnosis not present

## 2023-10-12 DIAGNOSIS — I110 Hypertensive heart disease with heart failure: Secondary | ICD-10-CM | POA: Diagnosis not present

## 2023-10-12 DIAGNOSIS — I5032 Chronic diastolic (congestive) heart failure: Secondary | ICD-10-CM | POA: Diagnosis not present

## 2023-10-12 DIAGNOSIS — E785 Hyperlipidemia, unspecified: Secondary | ICD-10-CM | POA: Diagnosis not present

## 2023-10-12 DIAGNOSIS — I4891 Unspecified atrial fibrillation: Secondary | ICD-10-CM | POA: Diagnosis not present

## 2023-10-18 DIAGNOSIS — M1712 Unilateral primary osteoarthritis, left knee: Secondary | ICD-10-CM | POA: Diagnosis not present

## 2023-10-18 DIAGNOSIS — S81811D Laceration without foreign body, right lower leg, subsequent encounter: Secondary | ICD-10-CM | POA: Diagnosis not present

## 2023-10-18 DIAGNOSIS — I5032 Chronic diastolic (congestive) heart failure: Secondary | ICD-10-CM | POA: Diagnosis not present

## 2023-10-18 DIAGNOSIS — E1065 Type 1 diabetes mellitus with hyperglycemia: Secondary | ICD-10-CM | POA: Diagnosis not present

## 2023-10-18 DIAGNOSIS — K5900 Constipation, unspecified: Secondary | ICD-10-CM | POA: Diagnosis not present

## 2023-10-18 DIAGNOSIS — S72491D Other fracture of lower end of right femur, subsequent encounter for closed fracture with routine healing: Secondary | ICD-10-CM | POA: Diagnosis not present

## 2023-10-18 DIAGNOSIS — M19042 Primary osteoarthritis, left hand: Secondary | ICD-10-CM | POA: Diagnosis not present

## 2023-10-18 DIAGNOSIS — H4052X3 Glaucoma secondary to other eye disorders, left eye, severe stage: Secondary | ICD-10-CM | POA: Diagnosis not present

## 2023-10-18 DIAGNOSIS — Z993 Dependence on wheelchair: Secondary | ICD-10-CM | POA: Diagnosis not present

## 2023-10-18 DIAGNOSIS — E669 Obesity, unspecified: Secondary | ICD-10-CM | POA: Diagnosis not present

## 2023-10-18 DIAGNOSIS — E785 Hyperlipidemia, unspecified: Secondary | ICD-10-CM | POA: Diagnosis not present

## 2023-10-18 DIAGNOSIS — I4891 Unspecified atrial fibrillation: Secondary | ICD-10-CM | POA: Diagnosis not present

## 2023-10-18 DIAGNOSIS — S82852D Displaced trimalleolar fracture of left lower leg, subsequent encounter for closed fracture with routine healing: Secondary | ICD-10-CM | POA: Diagnosis not present

## 2023-10-18 DIAGNOSIS — M8589 Other specified disorders of bone density and structure, multiple sites: Secondary | ICD-10-CM | POA: Diagnosis not present

## 2023-10-18 DIAGNOSIS — I110 Hypertensive heart disease with heart failure: Secondary | ICD-10-CM | POA: Diagnosis not present

## 2023-10-18 DIAGNOSIS — F411 Generalized anxiety disorder: Secondary | ICD-10-CM | POA: Diagnosis not present

## 2023-10-21 DIAGNOSIS — E669 Obesity, unspecified: Secondary | ICD-10-CM | POA: Diagnosis not present

## 2023-10-21 DIAGNOSIS — E785 Hyperlipidemia, unspecified: Secondary | ICD-10-CM | POA: Diagnosis not present

## 2023-10-21 DIAGNOSIS — E1065 Type 1 diabetes mellitus with hyperglycemia: Secondary | ICD-10-CM | POA: Diagnosis not present

## 2023-10-21 DIAGNOSIS — M8589 Other specified disorders of bone density and structure, multiple sites: Secondary | ICD-10-CM | POA: Diagnosis not present

## 2023-10-21 DIAGNOSIS — S82852D Displaced trimalleolar fracture of left lower leg, subsequent encounter for closed fracture with routine healing: Secondary | ICD-10-CM | POA: Diagnosis not present

## 2023-10-21 DIAGNOSIS — I5032 Chronic diastolic (congestive) heart failure: Secondary | ICD-10-CM | POA: Diagnosis not present

## 2023-10-21 DIAGNOSIS — I4891 Unspecified atrial fibrillation: Secondary | ICD-10-CM | POA: Diagnosis not present

## 2023-10-21 DIAGNOSIS — Z993 Dependence on wheelchair: Secondary | ICD-10-CM | POA: Diagnosis not present

## 2023-10-21 DIAGNOSIS — S81811D Laceration without foreign body, right lower leg, subsequent encounter: Secondary | ICD-10-CM | POA: Diagnosis not present

## 2023-10-21 DIAGNOSIS — K5900 Constipation, unspecified: Secondary | ICD-10-CM | POA: Diagnosis not present

## 2023-10-21 DIAGNOSIS — S72491D Other fracture of lower end of right femur, subsequent encounter for closed fracture with routine healing: Secondary | ICD-10-CM | POA: Diagnosis not present

## 2023-10-21 DIAGNOSIS — M19042 Primary osteoarthritis, left hand: Secondary | ICD-10-CM | POA: Diagnosis not present

## 2023-10-21 DIAGNOSIS — I110 Hypertensive heart disease with heart failure: Secondary | ICD-10-CM | POA: Diagnosis not present

## 2023-10-21 DIAGNOSIS — F411 Generalized anxiety disorder: Secondary | ICD-10-CM | POA: Diagnosis not present

## 2023-10-21 DIAGNOSIS — H4052X3 Glaucoma secondary to other eye disorders, left eye, severe stage: Secondary | ICD-10-CM | POA: Diagnosis not present

## 2023-10-21 DIAGNOSIS — M1712 Unilateral primary osteoarthritis, left knee: Secondary | ICD-10-CM | POA: Diagnosis not present

## 2023-10-26 DIAGNOSIS — M1712 Unilateral primary osteoarthritis, left knee: Secondary | ICD-10-CM | POA: Diagnosis not present

## 2023-10-26 DIAGNOSIS — E669 Obesity, unspecified: Secondary | ICD-10-CM | POA: Diagnosis not present

## 2023-10-26 DIAGNOSIS — S81811D Laceration without foreign body, right lower leg, subsequent encounter: Secondary | ICD-10-CM | POA: Diagnosis not present

## 2023-10-26 DIAGNOSIS — I5032 Chronic diastolic (congestive) heart failure: Secondary | ICD-10-CM | POA: Diagnosis not present

## 2023-10-26 DIAGNOSIS — F411 Generalized anxiety disorder: Secondary | ICD-10-CM | POA: Diagnosis not present

## 2023-10-26 DIAGNOSIS — M8589 Other specified disorders of bone density and structure, multiple sites: Secondary | ICD-10-CM | POA: Diagnosis not present

## 2023-10-26 DIAGNOSIS — E1065 Type 1 diabetes mellitus with hyperglycemia: Secondary | ICD-10-CM | POA: Diagnosis not present

## 2023-10-26 DIAGNOSIS — S72491D Other fracture of lower end of right femur, subsequent encounter for closed fracture with routine healing: Secondary | ICD-10-CM | POA: Diagnosis not present

## 2023-10-26 DIAGNOSIS — I110 Hypertensive heart disease with heart failure: Secondary | ICD-10-CM | POA: Diagnosis not present

## 2023-10-26 DIAGNOSIS — K5900 Constipation, unspecified: Secondary | ICD-10-CM | POA: Diagnosis not present

## 2023-10-26 DIAGNOSIS — H4052X3 Glaucoma secondary to other eye disorders, left eye, severe stage: Secondary | ICD-10-CM | POA: Diagnosis not present

## 2023-10-26 DIAGNOSIS — E785 Hyperlipidemia, unspecified: Secondary | ICD-10-CM | POA: Diagnosis not present

## 2023-10-26 DIAGNOSIS — I4891 Unspecified atrial fibrillation: Secondary | ICD-10-CM | POA: Diagnosis not present

## 2023-10-26 DIAGNOSIS — S82852D Displaced trimalleolar fracture of left lower leg, subsequent encounter for closed fracture with routine healing: Secondary | ICD-10-CM | POA: Diagnosis not present

## 2023-10-26 DIAGNOSIS — M19042 Primary osteoarthritis, left hand: Secondary | ICD-10-CM | POA: Diagnosis not present

## 2023-10-26 DIAGNOSIS — Z993 Dependence on wheelchair: Secondary | ICD-10-CM | POA: Diagnosis not present

## 2023-10-27 DIAGNOSIS — F411 Generalized anxiety disorder: Secondary | ICD-10-CM | POA: Diagnosis not present

## 2023-10-27 DIAGNOSIS — I110 Hypertensive heart disease with heart failure: Secondary | ICD-10-CM | POA: Diagnosis not present

## 2023-10-27 DIAGNOSIS — S82852D Displaced trimalleolar fracture of left lower leg, subsequent encounter for closed fracture with routine healing: Secondary | ICD-10-CM | POA: Diagnosis not present

## 2023-10-27 DIAGNOSIS — K5900 Constipation, unspecified: Secondary | ICD-10-CM | POA: Diagnosis not present

## 2023-10-27 DIAGNOSIS — E785 Hyperlipidemia, unspecified: Secondary | ICD-10-CM | POA: Diagnosis not present

## 2023-10-27 DIAGNOSIS — H4052X3 Glaucoma secondary to other eye disorders, left eye, severe stage: Secondary | ICD-10-CM | POA: Diagnosis not present

## 2023-10-27 DIAGNOSIS — I4891 Unspecified atrial fibrillation: Secondary | ICD-10-CM | POA: Diagnosis not present

## 2023-10-27 DIAGNOSIS — S81811D Laceration without foreign body, right lower leg, subsequent encounter: Secondary | ICD-10-CM | POA: Diagnosis not present

## 2023-10-27 DIAGNOSIS — I5032 Chronic diastolic (congestive) heart failure: Secondary | ICD-10-CM | POA: Diagnosis not present

## 2023-10-27 DIAGNOSIS — M1712 Unilateral primary osteoarthritis, left knee: Secondary | ICD-10-CM | POA: Diagnosis not present

## 2023-10-27 DIAGNOSIS — M19042 Primary osteoarthritis, left hand: Secondary | ICD-10-CM | POA: Diagnosis not present

## 2023-10-27 DIAGNOSIS — Z993 Dependence on wheelchair: Secondary | ICD-10-CM | POA: Diagnosis not present

## 2023-10-27 DIAGNOSIS — M8589 Other specified disorders of bone density and structure, multiple sites: Secondary | ICD-10-CM | POA: Diagnosis not present

## 2023-10-27 DIAGNOSIS — S72491D Other fracture of lower end of right femur, subsequent encounter for closed fracture with routine healing: Secondary | ICD-10-CM | POA: Diagnosis not present

## 2023-10-27 DIAGNOSIS — E1065 Type 1 diabetes mellitus with hyperglycemia: Secondary | ICD-10-CM | POA: Diagnosis not present

## 2023-10-27 DIAGNOSIS — E669 Obesity, unspecified: Secondary | ICD-10-CM | POA: Diagnosis not present

## 2023-10-28 DIAGNOSIS — S72491D Other fracture of lower end of right femur, subsequent encounter for closed fracture with routine healing: Secondary | ICD-10-CM | POA: Diagnosis not present

## 2023-10-28 DIAGNOSIS — M1712 Unilateral primary osteoarthritis, left knee: Secondary | ICD-10-CM | POA: Diagnosis not present

## 2023-10-28 DIAGNOSIS — F411 Generalized anxiety disorder: Secondary | ICD-10-CM | POA: Diagnosis not present

## 2023-10-28 DIAGNOSIS — H4052X3 Glaucoma secondary to other eye disorders, left eye, severe stage: Secondary | ICD-10-CM | POA: Diagnosis not present

## 2023-10-28 DIAGNOSIS — I4891 Unspecified atrial fibrillation: Secondary | ICD-10-CM | POA: Diagnosis not present

## 2023-10-28 DIAGNOSIS — K5900 Constipation, unspecified: Secondary | ICD-10-CM | POA: Diagnosis not present

## 2023-10-28 DIAGNOSIS — M19042 Primary osteoarthritis, left hand: Secondary | ICD-10-CM | POA: Diagnosis not present

## 2023-10-28 DIAGNOSIS — E669 Obesity, unspecified: Secondary | ICD-10-CM | POA: Diagnosis not present

## 2023-10-28 DIAGNOSIS — Z993 Dependence on wheelchair: Secondary | ICD-10-CM | POA: Diagnosis not present

## 2023-10-28 DIAGNOSIS — S81811D Laceration without foreign body, right lower leg, subsequent encounter: Secondary | ICD-10-CM | POA: Diagnosis not present

## 2023-10-28 DIAGNOSIS — I110 Hypertensive heart disease with heart failure: Secondary | ICD-10-CM | POA: Diagnosis not present

## 2023-10-28 DIAGNOSIS — E785 Hyperlipidemia, unspecified: Secondary | ICD-10-CM | POA: Diagnosis not present

## 2023-10-28 DIAGNOSIS — M8589 Other specified disorders of bone density and structure, multiple sites: Secondary | ICD-10-CM | POA: Diagnosis not present

## 2023-10-28 DIAGNOSIS — S82852D Displaced trimalleolar fracture of left lower leg, subsequent encounter for closed fracture with routine healing: Secondary | ICD-10-CM | POA: Diagnosis not present

## 2023-10-28 DIAGNOSIS — I5032 Chronic diastolic (congestive) heart failure: Secondary | ICD-10-CM | POA: Diagnosis not present

## 2023-10-28 DIAGNOSIS — E1065 Type 1 diabetes mellitus with hyperglycemia: Secondary | ICD-10-CM | POA: Diagnosis not present

## 2023-11-01 DIAGNOSIS — H4052X3 Glaucoma secondary to other eye disorders, left eye, severe stage: Secondary | ICD-10-CM | POA: Diagnosis not present

## 2023-11-01 DIAGNOSIS — I110 Hypertensive heart disease with heart failure: Secondary | ICD-10-CM | POA: Diagnosis not present

## 2023-11-01 DIAGNOSIS — S81811D Laceration without foreign body, right lower leg, subsequent encounter: Secondary | ICD-10-CM | POA: Diagnosis not present

## 2023-11-01 DIAGNOSIS — F411 Generalized anxiety disorder: Secondary | ICD-10-CM | POA: Diagnosis not present

## 2023-11-01 DIAGNOSIS — M19042 Primary osteoarthritis, left hand: Secondary | ICD-10-CM | POA: Diagnosis not present

## 2023-11-01 DIAGNOSIS — E669 Obesity, unspecified: Secondary | ICD-10-CM | POA: Diagnosis not present

## 2023-11-01 DIAGNOSIS — S82852D Displaced trimalleolar fracture of left lower leg, subsequent encounter for closed fracture with routine healing: Secondary | ICD-10-CM | POA: Diagnosis not present

## 2023-11-01 DIAGNOSIS — M1712 Unilateral primary osteoarthritis, left knee: Secondary | ICD-10-CM | POA: Diagnosis not present

## 2023-11-01 DIAGNOSIS — Z993 Dependence on wheelchair: Secondary | ICD-10-CM | POA: Diagnosis not present

## 2023-11-01 DIAGNOSIS — E785 Hyperlipidemia, unspecified: Secondary | ICD-10-CM | POA: Diagnosis not present

## 2023-11-01 DIAGNOSIS — M8589 Other specified disorders of bone density and structure, multiple sites: Secondary | ICD-10-CM | POA: Diagnosis not present

## 2023-11-01 DIAGNOSIS — S72491D Other fracture of lower end of right femur, subsequent encounter for closed fracture with routine healing: Secondary | ICD-10-CM | POA: Diagnosis not present

## 2023-11-01 DIAGNOSIS — I5032 Chronic diastolic (congestive) heart failure: Secondary | ICD-10-CM | POA: Diagnosis not present

## 2023-11-01 DIAGNOSIS — K5900 Constipation, unspecified: Secondary | ICD-10-CM | POA: Diagnosis not present

## 2023-11-01 DIAGNOSIS — E1065 Type 1 diabetes mellitus with hyperglycemia: Secondary | ICD-10-CM | POA: Diagnosis not present

## 2023-11-01 DIAGNOSIS — I4891 Unspecified atrial fibrillation: Secondary | ICD-10-CM | POA: Diagnosis not present

## 2023-11-02 ENCOUNTER — Other Ambulatory Visit (HOSPITAL_COMMUNITY): Payer: Self-pay | Admitting: Adult Health

## 2023-11-02 DIAGNOSIS — E1065 Type 1 diabetes mellitus with hyperglycemia: Secondary | ICD-10-CM | POA: Diagnosis not present

## 2023-11-02 DIAGNOSIS — Z993 Dependence on wheelchair: Secondary | ICD-10-CM | POA: Diagnosis not present

## 2023-11-02 DIAGNOSIS — S82852D Displaced trimalleolar fracture of left lower leg, subsequent encounter for closed fracture with routine healing: Secondary | ICD-10-CM | POA: Diagnosis not present

## 2023-11-02 DIAGNOSIS — E669 Obesity, unspecified: Secondary | ICD-10-CM | POA: Diagnosis not present

## 2023-11-02 DIAGNOSIS — K5900 Constipation, unspecified: Secondary | ICD-10-CM | POA: Diagnosis not present

## 2023-11-02 DIAGNOSIS — M1712 Unilateral primary osteoarthritis, left knee: Secondary | ICD-10-CM | POA: Diagnosis not present

## 2023-11-02 DIAGNOSIS — S72491D Other fracture of lower end of right femur, subsequent encounter for closed fracture with routine healing: Secondary | ICD-10-CM | POA: Diagnosis not present

## 2023-11-02 DIAGNOSIS — L97811 Non-pressure chronic ulcer of other part of right lower leg limited to breakdown of skin: Secondary | ICD-10-CM

## 2023-11-02 DIAGNOSIS — I110 Hypertensive heart disease with heart failure: Secondary | ICD-10-CM | POA: Diagnosis not present

## 2023-11-02 DIAGNOSIS — M8589 Other specified disorders of bone density and structure, multiple sites: Secondary | ICD-10-CM | POA: Diagnosis not present

## 2023-11-02 DIAGNOSIS — S81811D Laceration without foreign body, right lower leg, subsequent encounter: Secondary | ICD-10-CM | POA: Diagnosis not present

## 2023-11-02 DIAGNOSIS — I4891 Unspecified atrial fibrillation: Secondary | ICD-10-CM | POA: Diagnosis not present

## 2023-11-02 DIAGNOSIS — F411 Generalized anxiety disorder: Secondary | ICD-10-CM | POA: Diagnosis not present

## 2023-11-02 DIAGNOSIS — I5032 Chronic diastolic (congestive) heart failure: Secondary | ICD-10-CM | POA: Diagnosis not present

## 2023-11-02 DIAGNOSIS — E785 Hyperlipidemia, unspecified: Secondary | ICD-10-CM | POA: Diagnosis not present

## 2023-11-02 DIAGNOSIS — M19042 Primary osteoarthritis, left hand: Secondary | ICD-10-CM | POA: Diagnosis not present

## 2023-11-02 DIAGNOSIS — H4052X3 Glaucoma secondary to other eye disorders, left eye, severe stage: Secondary | ICD-10-CM | POA: Diagnosis not present

## 2023-11-03 DIAGNOSIS — I5032 Chronic diastolic (congestive) heart failure: Secondary | ICD-10-CM | POA: Diagnosis not present

## 2023-11-03 DIAGNOSIS — H4052X3 Glaucoma secondary to other eye disorders, left eye, severe stage: Secondary | ICD-10-CM | POA: Diagnosis not present

## 2023-11-03 DIAGNOSIS — M1712 Unilateral primary osteoarthritis, left knee: Secondary | ICD-10-CM | POA: Diagnosis not present

## 2023-11-03 DIAGNOSIS — E1065 Type 1 diabetes mellitus with hyperglycemia: Secondary | ICD-10-CM | POA: Diagnosis not present

## 2023-11-03 DIAGNOSIS — S72491D Other fracture of lower end of right femur, subsequent encounter for closed fracture with routine healing: Secondary | ICD-10-CM | POA: Diagnosis not present

## 2023-11-03 DIAGNOSIS — I4891 Unspecified atrial fibrillation: Secondary | ICD-10-CM | POA: Diagnosis not present

## 2023-11-03 DIAGNOSIS — M8589 Other specified disorders of bone density and structure, multiple sites: Secondary | ICD-10-CM | POA: Diagnosis not present

## 2023-11-03 DIAGNOSIS — E669 Obesity, unspecified: Secondary | ICD-10-CM | POA: Diagnosis not present

## 2023-11-03 DIAGNOSIS — Z993 Dependence on wheelchair: Secondary | ICD-10-CM | POA: Diagnosis not present

## 2023-11-03 DIAGNOSIS — K5900 Constipation, unspecified: Secondary | ICD-10-CM | POA: Diagnosis not present

## 2023-11-03 DIAGNOSIS — I110 Hypertensive heart disease with heart failure: Secondary | ICD-10-CM | POA: Diagnosis not present

## 2023-11-03 DIAGNOSIS — S82852D Displaced trimalleolar fracture of left lower leg, subsequent encounter for closed fracture with routine healing: Secondary | ICD-10-CM | POA: Diagnosis not present

## 2023-11-03 DIAGNOSIS — S81811D Laceration without foreign body, right lower leg, subsequent encounter: Secondary | ICD-10-CM | POA: Diagnosis not present

## 2023-11-03 DIAGNOSIS — F411 Generalized anxiety disorder: Secondary | ICD-10-CM | POA: Diagnosis not present

## 2023-11-03 DIAGNOSIS — E785 Hyperlipidemia, unspecified: Secondary | ICD-10-CM | POA: Diagnosis not present

## 2023-11-03 DIAGNOSIS — M19042 Primary osteoarthritis, left hand: Secondary | ICD-10-CM | POA: Diagnosis not present

## 2023-11-19 DIAGNOSIS — Z8616 Personal history of COVID-19: Secondary | ICD-10-CM | POA: Diagnosis not present

## 2023-11-19 DIAGNOSIS — M8589 Other specified disorders of bone density and structure, multiple sites: Secondary | ICD-10-CM | POA: Diagnosis not present

## 2023-11-19 DIAGNOSIS — S82852D Displaced trimalleolar fracture of left lower leg, subsequent encounter for closed fracture with routine healing: Secondary | ICD-10-CM | POA: Diagnosis not present

## 2023-11-19 DIAGNOSIS — M19042 Primary osteoarthritis, left hand: Secondary | ICD-10-CM | POA: Diagnosis not present

## 2023-11-19 DIAGNOSIS — S91002D Unspecified open wound, left ankle, subsequent encounter: Secondary | ICD-10-CM | POA: Diagnosis not present

## 2023-11-19 DIAGNOSIS — Z9181 History of falling: Secondary | ICD-10-CM | POA: Diagnosis not present

## 2023-11-19 DIAGNOSIS — E785 Hyperlipidemia, unspecified: Secondary | ICD-10-CM | POA: Diagnosis not present

## 2023-11-19 DIAGNOSIS — H4052X3 Glaucoma secondary to other eye disorders, left eye, severe stage: Secondary | ICD-10-CM | POA: Diagnosis not present

## 2023-11-19 DIAGNOSIS — S72491D Other fracture of lower end of right femur, subsequent encounter for closed fracture with routine healing: Secondary | ICD-10-CM | POA: Diagnosis not present

## 2023-11-19 DIAGNOSIS — I5032 Chronic diastolic (congestive) heart failure: Secondary | ICD-10-CM | POA: Diagnosis not present

## 2023-11-19 DIAGNOSIS — I110 Hypertensive heart disease with heart failure: Secondary | ICD-10-CM | POA: Diagnosis not present

## 2023-11-19 DIAGNOSIS — E109 Type 1 diabetes mellitus without complications: Secondary | ICD-10-CM | POA: Diagnosis not present

## 2023-11-19 DIAGNOSIS — Z556 Problems related to health literacy: Secondary | ICD-10-CM | POA: Diagnosis not present

## 2023-11-19 DIAGNOSIS — F411 Generalized anxiety disorder: Secondary | ICD-10-CM | POA: Diagnosis not present

## 2023-11-19 DIAGNOSIS — Z87442 Personal history of urinary calculi: Secondary | ICD-10-CM | POA: Diagnosis not present

## 2023-11-19 DIAGNOSIS — E669 Obesity, unspecified: Secondary | ICD-10-CM | POA: Diagnosis not present

## 2023-11-19 DIAGNOSIS — I4891 Unspecified atrial fibrillation: Secondary | ICD-10-CM | POA: Diagnosis not present

## 2023-11-19 DIAGNOSIS — M1712 Unilateral primary osteoarthritis, left knee: Secondary | ICD-10-CM | POA: Diagnosis not present

## 2023-11-24 DIAGNOSIS — Z556 Problems related to health literacy: Secondary | ICD-10-CM | POA: Diagnosis not present

## 2023-11-24 DIAGNOSIS — I4891 Unspecified atrial fibrillation: Secondary | ICD-10-CM | POA: Diagnosis not present

## 2023-11-24 DIAGNOSIS — E785 Hyperlipidemia, unspecified: Secondary | ICD-10-CM | POA: Diagnosis not present

## 2023-11-24 DIAGNOSIS — Z8616 Personal history of COVID-19: Secondary | ICD-10-CM | POA: Diagnosis not present

## 2023-11-24 DIAGNOSIS — M1712 Unilateral primary osteoarthritis, left knee: Secondary | ICD-10-CM | POA: Diagnosis not present

## 2023-11-24 DIAGNOSIS — F411 Generalized anxiety disorder: Secondary | ICD-10-CM | POA: Diagnosis not present

## 2023-11-24 DIAGNOSIS — M8589 Other specified disorders of bone density and structure, multiple sites: Secondary | ICD-10-CM | POA: Diagnosis not present

## 2023-11-24 DIAGNOSIS — H4052X3 Glaucoma secondary to other eye disorders, left eye, severe stage: Secondary | ICD-10-CM | POA: Diagnosis not present

## 2023-11-24 DIAGNOSIS — E109 Type 1 diabetes mellitus without complications: Secondary | ICD-10-CM | POA: Diagnosis not present

## 2023-11-24 DIAGNOSIS — S72491D Other fracture of lower end of right femur, subsequent encounter for closed fracture with routine healing: Secondary | ICD-10-CM | POA: Diagnosis not present

## 2023-11-24 DIAGNOSIS — S91002D Unspecified open wound, left ankle, subsequent encounter: Secondary | ICD-10-CM | POA: Diagnosis not present

## 2023-11-24 DIAGNOSIS — E669 Obesity, unspecified: Secondary | ICD-10-CM | POA: Diagnosis not present

## 2023-11-24 DIAGNOSIS — M19042 Primary osteoarthritis, left hand: Secondary | ICD-10-CM | POA: Diagnosis not present

## 2023-11-24 DIAGNOSIS — I110 Hypertensive heart disease with heart failure: Secondary | ICD-10-CM | POA: Diagnosis not present

## 2023-11-24 DIAGNOSIS — I5032 Chronic diastolic (congestive) heart failure: Secondary | ICD-10-CM | POA: Diagnosis not present

## 2023-11-24 DIAGNOSIS — S82852D Displaced trimalleolar fracture of left lower leg, subsequent encounter for closed fracture with routine healing: Secondary | ICD-10-CM | POA: Diagnosis not present

## 2023-11-25 ENCOUNTER — Encounter (HOSPITAL_COMMUNITY): Payer: Medicare Other

## 2023-11-25 DIAGNOSIS — I4891 Unspecified atrial fibrillation: Secondary | ICD-10-CM | POA: Diagnosis not present

## 2023-11-25 DIAGNOSIS — I110 Hypertensive heart disease with heart failure: Secondary | ICD-10-CM | POA: Diagnosis not present

## 2023-11-25 DIAGNOSIS — Z556 Problems related to health literacy: Secondary | ICD-10-CM | POA: Diagnosis not present

## 2023-11-25 DIAGNOSIS — H4052X3 Glaucoma secondary to other eye disorders, left eye, severe stage: Secondary | ICD-10-CM | POA: Diagnosis not present

## 2023-11-25 DIAGNOSIS — S82852D Displaced trimalleolar fracture of left lower leg, subsequent encounter for closed fracture with routine healing: Secondary | ICD-10-CM | POA: Diagnosis not present

## 2023-11-25 DIAGNOSIS — E785 Hyperlipidemia, unspecified: Secondary | ICD-10-CM | POA: Diagnosis not present

## 2023-11-25 DIAGNOSIS — M8589 Other specified disorders of bone density and structure, multiple sites: Secondary | ICD-10-CM | POA: Diagnosis not present

## 2023-11-25 DIAGNOSIS — E669 Obesity, unspecified: Secondary | ICD-10-CM | POA: Diagnosis not present

## 2023-11-25 DIAGNOSIS — S72491D Other fracture of lower end of right femur, subsequent encounter for closed fracture with routine healing: Secondary | ICD-10-CM | POA: Diagnosis not present

## 2023-11-25 DIAGNOSIS — S91002D Unspecified open wound, left ankle, subsequent encounter: Secondary | ICD-10-CM | POA: Diagnosis not present

## 2023-11-25 DIAGNOSIS — M1712 Unilateral primary osteoarthritis, left knee: Secondary | ICD-10-CM | POA: Diagnosis not present

## 2023-11-25 DIAGNOSIS — E109 Type 1 diabetes mellitus without complications: Secondary | ICD-10-CM | POA: Diagnosis not present

## 2023-11-25 DIAGNOSIS — F411 Generalized anxiety disorder: Secondary | ICD-10-CM | POA: Diagnosis not present

## 2023-11-25 DIAGNOSIS — I5032 Chronic diastolic (congestive) heart failure: Secondary | ICD-10-CM | POA: Diagnosis not present

## 2023-11-25 DIAGNOSIS — M19042 Primary osteoarthritis, left hand: Secondary | ICD-10-CM | POA: Diagnosis not present

## 2023-11-25 DIAGNOSIS — Z8616 Personal history of COVID-19: Secondary | ICD-10-CM | POA: Diagnosis not present

## 2023-11-29 DIAGNOSIS — Z8616 Personal history of COVID-19: Secondary | ICD-10-CM | POA: Diagnosis not present

## 2023-11-29 DIAGNOSIS — M8589 Other specified disorders of bone density and structure, multiple sites: Secondary | ICD-10-CM | POA: Diagnosis not present

## 2023-11-29 DIAGNOSIS — H4052X3 Glaucoma secondary to other eye disorders, left eye, severe stage: Secondary | ICD-10-CM | POA: Diagnosis not present

## 2023-11-29 DIAGNOSIS — S91002D Unspecified open wound, left ankle, subsequent encounter: Secondary | ICD-10-CM | POA: Diagnosis not present

## 2023-11-29 DIAGNOSIS — E785 Hyperlipidemia, unspecified: Secondary | ICD-10-CM | POA: Diagnosis not present

## 2023-11-29 DIAGNOSIS — M19042 Primary osteoarthritis, left hand: Secondary | ICD-10-CM | POA: Diagnosis not present

## 2023-11-29 DIAGNOSIS — I4891 Unspecified atrial fibrillation: Secondary | ICD-10-CM | POA: Diagnosis not present

## 2023-11-29 DIAGNOSIS — E669 Obesity, unspecified: Secondary | ICD-10-CM | POA: Diagnosis not present

## 2023-11-29 DIAGNOSIS — S82852D Displaced trimalleolar fracture of left lower leg, subsequent encounter for closed fracture with routine healing: Secondary | ICD-10-CM | POA: Diagnosis not present

## 2023-11-29 DIAGNOSIS — F411 Generalized anxiety disorder: Secondary | ICD-10-CM | POA: Diagnosis not present

## 2023-11-29 DIAGNOSIS — M1712 Unilateral primary osteoarthritis, left knee: Secondary | ICD-10-CM | POA: Diagnosis not present

## 2023-11-29 DIAGNOSIS — Z556 Problems related to health literacy: Secondary | ICD-10-CM | POA: Diagnosis not present

## 2023-11-29 DIAGNOSIS — I110 Hypertensive heart disease with heart failure: Secondary | ICD-10-CM | POA: Diagnosis not present

## 2023-11-29 DIAGNOSIS — I5032 Chronic diastolic (congestive) heart failure: Secondary | ICD-10-CM | POA: Diagnosis not present

## 2023-11-29 DIAGNOSIS — E109 Type 1 diabetes mellitus without complications: Secondary | ICD-10-CM | POA: Diagnosis not present

## 2023-11-29 DIAGNOSIS — S72491D Other fracture of lower end of right femur, subsequent encounter for closed fracture with routine healing: Secondary | ICD-10-CM | POA: Diagnosis not present

## 2023-12-01 DIAGNOSIS — I5032 Chronic diastolic (congestive) heart failure: Secondary | ICD-10-CM | POA: Diagnosis not present

## 2023-12-01 DIAGNOSIS — I110 Hypertensive heart disease with heart failure: Secondary | ICD-10-CM | POA: Diagnosis not present

## 2023-12-01 DIAGNOSIS — E785 Hyperlipidemia, unspecified: Secondary | ICD-10-CM | POA: Diagnosis not present

## 2023-12-01 DIAGNOSIS — S91002D Unspecified open wound, left ankle, subsequent encounter: Secondary | ICD-10-CM | POA: Diagnosis not present

## 2023-12-01 DIAGNOSIS — F411 Generalized anxiety disorder: Secondary | ICD-10-CM | POA: Diagnosis not present

## 2023-12-01 DIAGNOSIS — E669 Obesity, unspecified: Secondary | ICD-10-CM | POA: Diagnosis not present

## 2023-12-01 DIAGNOSIS — M19042 Primary osteoarthritis, left hand: Secondary | ICD-10-CM | POA: Diagnosis not present

## 2023-12-01 DIAGNOSIS — I4891 Unspecified atrial fibrillation: Secondary | ICD-10-CM | POA: Diagnosis not present

## 2023-12-01 DIAGNOSIS — S82852D Displaced trimalleolar fracture of left lower leg, subsequent encounter for closed fracture with routine healing: Secondary | ICD-10-CM | POA: Diagnosis not present

## 2023-12-01 DIAGNOSIS — H4052X3 Glaucoma secondary to other eye disorders, left eye, severe stage: Secondary | ICD-10-CM | POA: Diagnosis not present

## 2023-12-01 DIAGNOSIS — S72491D Other fracture of lower end of right femur, subsequent encounter for closed fracture with routine healing: Secondary | ICD-10-CM | POA: Diagnosis not present

## 2023-12-01 DIAGNOSIS — M8589 Other specified disorders of bone density and structure, multiple sites: Secondary | ICD-10-CM | POA: Diagnosis not present

## 2023-12-01 DIAGNOSIS — Z556 Problems related to health literacy: Secondary | ICD-10-CM | POA: Diagnosis not present

## 2023-12-01 DIAGNOSIS — E109 Type 1 diabetes mellitus without complications: Secondary | ICD-10-CM | POA: Diagnosis not present

## 2023-12-01 DIAGNOSIS — Z8616 Personal history of COVID-19: Secondary | ICD-10-CM | POA: Diagnosis not present

## 2023-12-01 DIAGNOSIS — M1712 Unilateral primary osteoarthritis, left knee: Secondary | ICD-10-CM | POA: Diagnosis not present

## 2023-12-02 DIAGNOSIS — M1712 Unilateral primary osteoarthritis, left knee: Secondary | ICD-10-CM | POA: Diagnosis not present

## 2023-12-02 DIAGNOSIS — S82852D Displaced trimalleolar fracture of left lower leg, subsequent encounter for closed fracture with routine healing: Secondary | ICD-10-CM | POA: Diagnosis not present

## 2023-12-02 DIAGNOSIS — E785 Hyperlipidemia, unspecified: Secondary | ICD-10-CM | POA: Diagnosis not present

## 2023-12-02 DIAGNOSIS — Z8616 Personal history of COVID-19: Secondary | ICD-10-CM | POA: Diagnosis not present

## 2023-12-02 DIAGNOSIS — E669 Obesity, unspecified: Secondary | ICD-10-CM | POA: Diagnosis not present

## 2023-12-02 DIAGNOSIS — I5032 Chronic diastolic (congestive) heart failure: Secondary | ICD-10-CM | POA: Diagnosis not present

## 2023-12-02 DIAGNOSIS — F411 Generalized anxiety disorder: Secondary | ICD-10-CM | POA: Diagnosis not present

## 2023-12-02 DIAGNOSIS — H4052X3 Glaucoma secondary to other eye disorders, left eye, severe stage: Secondary | ICD-10-CM | POA: Diagnosis not present

## 2023-12-02 DIAGNOSIS — M8589 Other specified disorders of bone density and structure, multiple sites: Secondary | ICD-10-CM | POA: Diagnosis not present

## 2023-12-02 DIAGNOSIS — S72491D Other fracture of lower end of right femur, subsequent encounter for closed fracture with routine healing: Secondary | ICD-10-CM | POA: Diagnosis not present

## 2023-12-02 DIAGNOSIS — Z556 Problems related to health literacy: Secondary | ICD-10-CM | POA: Diagnosis not present

## 2023-12-02 DIAGNOSIS — E109 Type 1 diabetes mellitus without complications: Secondary | ICD-10-CM | POA: Diagnosis not present

## 2023-12-02 DIAGNOSIS — I110 Hypertensive heart disease with heart failure: Secondary | ICD-10-CM | POA: Diagnosis not present

## 2023-12-02 DIAGNOSIS — S91002D Unspecified open wound, left ankle, subsequent encounter: Secondary | ICD-10-CM | POA: Diagnosis not present

## 2023-12-02 DIAGNOSIS — I4891 Unspecified atrial fibrillation: Secondary | ICD-10-CM | POA: Diagnosis not present

## 2023-12-02 DIAGNOSIS — M19042 Primary osteoarthritis, left hand: Secondary | ICD-10-CM | POA: Diagnosis not present

## 2023-12-07 DIAGNOSIS — M8589 Other specified disorders of bone density and structure, multiple sites: Secondary | ICD-10-CM | POA: Diagnosis not present

## 2023-12-07 DIAGNOSIS — E109 Type 1 diabetes mellitus without complications: Secondary | ICD-10-CM | POA: Diagnosis not present

## 2023-12-07 DIAGNOSIS — Z87442 Personal history of urinary calculi: Secondary | ICD-10-CM | POA: Diagnosis not present

## 2023-12-07 DIAGNOSIS — M1712 Unilateral primary osteoarthritis, left knee: Secondary | ICD-10-CM | POA: Diagnosis not present

## 2023-12-07 DIAGNOSIS — S72491D Other fracture of lower end of right femur, subsequent encounter for closed fracture with routine healing: Secondary | ICD-10-CM | POA: Diagnosis not present

## 2023-12-07 DIAGNOSIS — E785 Hyperlipidemia, unspecified: Secondary | ICD-10-CM | POA: Diagnosis not present

## 2023-12-07 DIAGNOSIS — Z9181 History of falling: Secondary | ICD-10-CM | POA: Diagnosis not present

## 2023-12-07 DIAGNOSIS — I5032 Chronic diastolic (congestive) heart failure: Secondary | ICD-10-CM | POA: Diagnosis not present

## 2023-12-07 DIAGNOSIS — H4052X3 Glaucoma secondary to other eye disorders, left eye, severe stage: Secondary | ICD-10-CM | POA: Diagnosis not present

## 2023-12-07 DIAGNOSIS — M19042 Primary osteoarthritis, left hand: Secondary | ICD-10-CM | POA: Diagnosis not present

## 2023-12-07 DIAGNOSIS — F411 Generalized anxiety disorder: Secondary | ICD-10-CM | POA: Diagnosis not present

## 2023-12-07 DIAGNOSIS — S91002D Unspecified open wound, left ankle, subsequent encounter: Secondary | ICD-10-CM | POA: Diagnosis not present

## 2023-12-07 DIAGNOSIS — S82852D Displaced trimalleolar fracture of left lower leg, subsequent encounter for closed fracture with routine healing: Secondary | ICD-10-CM | POA: Diagnosis not present

## 2023-12-07 DIAGNOSIS — Z8616 Personal history of COVID-19: Secondary | ICD-10-CM | POA: Diagnosis not present

## 2023-12-07 DIAGNOSIS — Z556 Problems related to health literacy: Secondary | ICD-10-CM | POA: Diagnosis not present

## 2023-12-07 DIAGNOSIS — I110 Hypertensive heart disease with heart failure: Secondary | ICD-10-CM | POA: Diagnosis not present

## 2023-12-07 DIAGNOSIS — E669 Obesity, unspecified: Secondary | ICD-10-CM | POA: Diagnosis not present

## 2023-12-07 DIAGNOSIS — I4891 Unspecified atrial fibrillation: Secondary | ICD-10-CM | POA: Diagnosis not present

## 2023-12-08 DIAGNOSIS — H4052X3 Glaucoma secondary to other eye disorders, left eye, severe stage: Secondary | ICD-10-CM | POA: Diagnosis not present

## 2023-12-08 DIAGNOSIS — S72491D Other fracture of lower end of right femur, subsequent encounter for closed fracture with routine healing: Secondary | ICD-10-CM | POA: Diagnosis not present

## 2023-12-08 DIAGNOSIS — I110 Hypertensive heart disease with heart failure: Secondary | ICD-10-CM | POA: Diagnosis not present

## 2023-12-08 DIAGNOSIS — S91002D Unspecified open wound, left ankle, subsequent encounter: Secondary | ICD-10-CM | POA: Diagnosis not present

## 2023-12-08 DIAGNOSIS — M8589 Other specified disorders of bone density and structure, multiple sites: Secondary | ICD-10-CM | POA: Diagnosis not present

## 2023-12-08 DIAGNOSIS — E669 Obesity, unspecified: Secondary | ICD-10-CM | POA: Diagnosis not present

## 2023-12-08 DIAGNOSIS — M19042 Primary osteoarthritis, left hand: Secondary | ICD-10-CM | POA: Diagnosis not present

## 2023-12-08 DIAGNOSIS — Z556 Problems related to health literacy: Secondary | ICD-10-CM | POA: Diagnosis not present

## 2023-12-08 DIAGNOSIS — S82852D Displaced trimalleolar fracture of left lower leg, subsequent encounter for closed fracture with routine healing: Secondary | ICD-10-CM | POA: Diagnosis not present

## 2023-12-08 DIAGNOSIS — M1712 Unilateral primary osteoarthritis, left knee: Secondary | ICD-10-CM | POA: Diagnosis not present

## 2023-12-08 DIAGNOSIS — Z8616 Personal history of COVID-19: Secondary | ICD-10-CM | POA: Diagnosis not present

## 2023-12-08 DIAGNOSIS — F411 Generalized anxiety disorder: Secondary | ICD-10-CM | POA: Diagnosis not present

## 2023-12-08 DIAGNOSIS — I4891 Unspecified atrial fibrillation: Secondary | ICD-10-CM | POA: Diagnosis not present

## 2023-12-08 DIAGNOSIS — E109 Type 1 diabetes mellitus without complications: Secondary | ICD-10-CM | POA: Diagnosis not present

## 2023-12-08 DIAGNOSIS — E785 Hyperlipidemia, unspecified: Secondary | ICD-10-CM | POA: Diagnosis not present

## 2023-12-08 DIAGNOSIS — I5032 Chronic diastolic (congestive) heart failure: Secondary | ICD-10-CM | POA: Diagnosis not present

## 2023-12-09 DIAGNOSIS — F411 Generalized anxiety disorder: Secondary | ICD-10-CM | POA: Diagnosis not present

## 2023-12-09 DIAGNOSIS — Z8616 Personal history of COVID-19: Secondary | ICD-10-CM | POA: Diagnosis not present

## 2023-12-09 DIAGNOSIS — M1712 Unilateral primary osteoarthritis, left knee: Secondary | ICD-10-CM | POA: Diagnosis not present

## 2023-12-09 DIAGNOSIS — M8589 Other specified disorders of bone density and structure, multiple sites: Secondary | ICD-10-CM | POA: Diagnosis not present

## 2023-12-09 DIAGNOSIS — I5032 Chronic diastolic (congestive) heart failure: Secondary | ICD-10-CM | POA: Diagnosis not present

## 2023-12-09 DIAGNOSIS — I4891 Unspecified atrial fibrillation: Secondary | ICD-10-CM | POA: Diagnosis not present

## 2023-12-09 DIAGNOSIS — I110 Hypertensive heart disease with heart failure: Secondary | ICD-10-CM | POA: Diagnosis not present

## 2023-12-09 DIAGNOSIS — M19042 Primary osteoarthritis, left hand: Secondary | ICD-10-CM | POA: Diagnosis not present

## 2023-12-09 DIAGNOSIS — S91002D Unspecified open wound, left ankle, subsequent encounter: Secondary | ICD-10-CM | POA: Diagnosis not present

## 2023-12-09 DIAGNOSIS — N390 Urinary tract infection, site not specified: Secondary | ICD-10-CM | POA: Diagnosis not present

## 2023-12-09 DIAGNOSIS — E669 Obesity, unspecified: Secondary | ICD-10-CM | POA: Diagnosis not present

## 2023-12-09 DIAGNOSIS — E785 Hyperlipidemia, unspecified: Secondary | ICD-10-CM | POA: Diagnosis not present

## 2023-12-09 DIAGNOSIS — S72491D Other fracture of lower end of right femur, subsequent encounter for closed fracture with routine healing: Secondary | ICD-10-CM | POA: Diagnosis not present

## 2023-12-09 DIAGNOSIS — E109 Type 1 diabetes mellitus without complications: Secondary | ICD-10-CM | POA: Diagnosis not present

## 2023-12-09 DIAGNOSIS — H4052X3 Glaucoma secondary to other eye disorders, left eye, severe stage: Secondary | ICD-10-CM | POA: Diagnosis not present

## 2023-12-09 DIAGNOSIS — Z556 Problems related to health literacy: Secondary | ICD-10-CM | POA: Diagnosis not present

## 2023-12-09 DIAGNOSIS — S82852D Displaced trimalleolar fracture of left lower leg, subsequent encounter for closed fracture with routine healing: Secondary | ICD-10-CM | POA: Diagnosis not present

## 2023-12-14 DIAGNOSIS — E109 Type 1 diabetes mellitus without complications: Secondary | ICD-10-CM | POA: Diagnosis not present

## 2023-12-14 DIAGNOSIS — E669 Obesity, unspecified: Secondary | ICD-10-CM | POA: Diagnosis not present

## 2023-12-14 DIAGNOSIS — Z556 Problems related to health literacy: Secondary | ICD-10-CM | POA: Diagnosis not present

## 2023-12-14 DIAGNOSIS — Z8616 Personal history of COVID-19: Secondary | ICD-10-CM | POA: Diagnosis not present

## 2023-12-14 DIAGNOSIS — M19042 Primary osteoarthritis, left hand: Secondary | ICD-10-CM | POA: Diagnosis not present

## 2023-12-14 DIAGNOSIS — I110 Hypertensive heart disease with heart failure: Secondary | ICD-10-CM | POA: Diagnosis not present

## 2023-12-14 DIAGNOSIS — M8589 Other specified disorders of bone density and structure, multiple sites: Secondary | ICD-10-CM | POA: Diagnosis not present

## 2023-12-14 DIAGNOSIS — I4891 Unspecified atrial fibrillation: Secondary | ICD-10-CM | POA: Diagnosis not present

## 2023-12-14 DIAGNOSIS — I5032 Chronic diastolic (congestive) heart failure: Secondary | ICD-10-CM | POA: Diagnosis not present

## 2023-12-14 DIAGNOSIS — S72491D Other fracture of lower end of right femur, subsequent encounter for closed fracture with routine healing: Secondary | ICD-10-CM | POA: Diagnosis not present

## 2023-12-14 DIAGNOSIS — M1712 Unilateral primary osteoarthritis, left knee: Secondary | ICD-10-CM | POA: Diagnosis not present

## 2023-12-14 DIAGNOSIS — H4052X3 Glaucoma secondary to other eye disorders, left eye, severe stage: Secondary | ICD-10-CM | POA: Diagnosis not present

## 2023-12-14 DIAGNOSIS — S82852D Displaced trimalleolar fracture of left lower leg, subsequent encounter for closed fracture with routine healing: Secondary | ICD-10-CM | POA: Diagnosis not present

## 2023-12-14 DIAGNOSIS — E785 Hyperlipidemia, unspecified: Secondary | ICD-10-CM | POA: Diagnosis not present

## 2023-12-14 DIAGNOSIS — F411 Generalized anxiety disorder: Secondary | ICD-10-CM | POA: Diagnosis not present

## 2023-12-14 DIAGNOSIS — S91002D Unspecified open wound, left ankle, subsequent encounter: Secondary | ICD-10-CM | POA: Diagnosis not present

## 2023-12-16 DIAGNOSIS — F411 Generalized anxiety disorder: Secondary | ICD-10-CM | POA: Diagnosis not present

## 2023-12-16 DIAGNOSIS — E109 Type 1 diabetes mellitus without complications: Secondary | ICD-10-CM | POA: Diagnosis not present

## 2023-12-16 DIAGNOSIS — S91002D Unspecified open wound, left ankle, subsequent encounter: Secondary | ICD-10-CM | POA: Diagnosis not present

## 2023-12-16 DIAGNOSIS — S72491D Other fracture of lower end of right femur, subsequent encounter for closed fracture with routine healing: Secondary | ICD-10-CM | POA: Diagnosis not present

## 2023-12-16 DIAGNOSIS — M1712 Unilateral primary osteoarthritis, left knee: Secondary | ICD-10-CM | POA: Diagnosis not present

## 2023-12-16 DIAGNOSIS — H4052X3 Glaucoma secondary to other eye disorders, left eye, severe stage: Secondary | ICD-10-CM | POA: Diagnosis not present

## 2023-12-16 DIAGNOSIS — I5032 Chronic diastolic (congestive) heart failure: Secondary | ICD-10-CM | POA: Diagnosis not present

## 2023-12-16 DIAGNOSIS — I4891 Unspecified atrial fibrillation: Secondary | ICD-10-CM | POA: Diagnosis not present

## 2023-12-16 DIAGNOSIS — M19042 Primary osteoarthritis, left hand: Secondary | ICD-10-CM | POA: Diagnosis not present

## 2023-12-16 DIAGNOSIS — Z556 Problems related to health literacy: Secondary | ICD-10-CM | POA: Diagnosis not present

## 2023-12-16 DIAGNOSIS — S82852D Displaced trimalleolar fracture of left lower leg, subsequent encounter for closed fracture with routine healing: Secondary | ICD-10-CM | POA: Diagnosis not present

## 2023-12-16 DIAGNOSIS — I110 Hypertensive heart disease with heart failure: Secondary | ICD-10-CM | POA: Diagnosis not present

## 2023-12-16 DIAGNOSIS — E669 Obesity, unspecified: Secondary | ICD-10-CM | POA: Diagnosis not present

## 2023-12-16 DIAGNOSIS — Z8616 Personal history of COVID-19: Secondary | ICD-10-CM | POA: Diagnosis not present

## 2023-12-16 DIAGNOSIS — E785 Hyperlipidemia, unspecified: Secondary | ICD-10-CM | POA: Diagnosis not present

## 2023-12-16 DIAGNOSIS — M8589 Other specified disorders of bone density and structure, multiple sites: Secondary | ICD-10-CM | POA: Diagnosis not present

## 2023-12-17 DIAGNOSIS — M1712 Unilateral primary osteoarthritis, left knee: Secondary | ICD-10-CM | POA: Diagnosis not present

## 2023-12-17 DIAGNOSIS — Z556 Problems related to health literacy: Secondary | ICD-10-CM | POA: Diagnosis not present

## 2023-12-17 DIAGNOSIS — I110 Hypertensive heart disease with heart failure: Secondary | ICD-10-CM | POA: Diagnosis not present

## 2023-12-17 DIAGNOSIS — S91002D Unspecified open wound, left ankle, subsequent encounter: Secondary | ICD-10-CM | POA: Diagnosis not present

## 2023-12-17 DIAGNOSIS — H4052X3 Glaucoma secondary to other eye disorders, left eye, severe stage: Secondary | ICD-10-CM | POA: Diagnosis not present

## 2023-12-17 DIAGNOSIS — E109 Type 1 diabetes mellitus without complications: Secondary | ICD-10-CM | POA: Diagnosis not present

## 2023-12-17 DIAGNOSIS — M19042 Primary osteoarthritis, left hand: Secondary | ICD-10-CM | POA: Diagnosis not present

## 2023-12-17 DIAGNOSIS — E669 Obesity, unspecified: Secondary | ICD-10-CM | POA: Diagnosis not present

## 2023-12-17 DIAGNOSIS — I5032 Chronic diastolic (congestive) heart failure: Secondary | ICD-10-CM | POA: Diagnosis not present

## 2023-12-17 DIAGNOSIS — M8589 Other specified disorders of bone density and structure, multiple sites: Secondary | ICD-10-CM | POA: Diagnosis not present

## 2023-12-17 DIAGNOSIS — S72491D Other fracture of lower end of right femur, subsequent encounter for closed fracture with routine healing: Secondary | ICD-10-CM | POA: Diagnosis not present

## 2023-12-17 DIAGNOSIS — Z8616 Personal history of COVID-19: Secondary | ICD-10-CM | POA: Diagnosis not present

## 2023-12-17 DIAGNOSIS — I4891 Unspecified atrial fibrillation: Secondary | ICD-10-CM | POA: Diagnosis not present

## 2023-12-17 DIAGNOSIS — F411 Generalized anxiety disorder: Secondary | ICD-10-CM | POA: Diagnosis not present

## 2023-12-17 DIAGNOSIS — E785 Hyperlipidemia, unspecified: Secondary | ICD-10-CM | POA: Diagnosis not present

## 2023-12-17 DIAGNOSIS — S82852D Displaced trimalleolar fracture of left lower leg, subsequent encounter for closed fracture with routine healing: Secondary | ICD-10-CM | POA: Diagnosis not present

## 2023-12-21 DIAGNOSIS — I4891 Unspecified atrial fibrillation: Secondary | ICD-10-CM | POA: Diagnosis not present

## 2023-12-21 DIAGNOSIS — M1712 Unilateral primary osteoarthritis, left knee: Secondary | ICD-10-CM | POA: Diagnosis not present

## 2023-12-21 DIAGNOSIS — E785 Hyperlipidemia, unspecified: Secondary | ICD-10-CM | POA: Diagnosis not present

## 2023-12-21 DIAGNOSIS — E109 Type 1 diabetes mellitus without complications: Secondary | ICD-10-CM | POA: Diagnosis not present

## 2023-12-21 DIAGNOSIS — S91002D Unspecified open wound, left ankle, subsequent encounter: Secondary | ICD-10-CM | POA: Diagnosis not present

## 2023-12-21 DIAGNOSIS — E669 Obesity, unspecified: Secondary | ICD-10-CM | POA: Diagnosis not present

## 2023-12-21 DIAGNOSIS — I5032 Chronic diastolic (congestive) heart failure: Secondary | ICD-10-CM | POA: Diagnosis not present

## 2023-12-21 DIAGNOSIS — I110 Hypertensive heart disease with heart failure: Secondary | ICD-10-CM | POA: Diagnosis not present

## 2023-12-21 DIAGNOSIS — H4052X3 Glaucoma secondary to other eye disorders, left eye, severe stage: Secondary | ICD-10-CM | POA: Diagnosis not present

## 2023-12-21 DIAGNOSIS — Z8616 Personal history of COVID-19: Secondary | ICD-10-CM | POA: Diagnosis not present

## 2023-12-21 DIAGNOSIS — F411 Generalized anxiety disorder: Secondary | ICD-10-CM | POA: Diagnosis not present

## 2023-12-21 DIAGNOSIS — S72491D Other fracture of lower end of right femur, subsequent encounter for closed fracture with routine healing: Secondary | ICD-10-CM | POA: Diagnosis not present

## 2023-12-21 DIAGNOSIS — Z556 Problems related to health literacy: Secondary | ICD-10-CM | POA: Diagnosis not present

## 2023-12-21 DIAGNOSIS — S82852D Displaced trimalleolar fracture of left lower leg, subsequent encounter for closed fracture with routine healing: Secondary | ICD-10-CM | POA: Diagnosis not present

## 2023-12-21 DIAGNOSIS — M19042 Primary osteoarthritis, left hand: Secondary | ICD-10-CM | POA: Diagnosis not present

## 2023-12-21 DIAGNOSIS — M8589 Other specified disorders of bone density and structure, multiple sites: Secondary | ICD-10-CM | POA: Diagnosis not present

## 2023-12-22 DIAGNOSIS — Z556 Problems related to health literacy: Secondary | ICD-10-CM | POA: Diagnosis not present

## 2023-12-22 DIAGNOSIS — E109 Type 1 diabetes mellitus without complications: Secondary | ICD-10-CM | POA: Diagnosis not present

## 2023-12-22 DIAGNOSIS — F411 Generalized anxiety disorder: Secondary | ICD-10-CM | POA: Diagnosis not present

## 2023-12-22 DIAGNOSIS — E669 Obesity, unspecified: Secondary | ICD-10-CM | POA: Diagnosis not present

## 2023-12-22 DIAGNOSIS — E785 Hyperlipidemia, unspecified: Secondary | ICD-10-CM | POA: Diagnosis not present

## 2023-12-22 DIAGNOSIS — I110 Hypertensive heart disease with heart failure: Secondary | ICD-10-CM | POA: Diagnosis not present

## 2023-12-22 DIAGNOSIS — S82852D Displaced trimalleolar fracture of left lower leg, subsequent encounter for closed fracture with routine healing: Secondary | ICD-10-CM | POA: Diagnosis not present

## 2023-12-22 DIAGNOSIS — Z8616 Personal history of COVID-19: Secondary | ICD-10-CM | POA: Diagnosis not present

## 2023-12-22 DIAGNOSIS — S91002D Unspecified open wound, left ankle, subsequent encounter: Secondary | ICD-10-CM | POA: Diagnosis not present

## 2023-12-22 DIAGNOSIS — I4891 Unspecified atrial fibrillation: Secondary | ICD-10-CM | POA: Diagnosis not present

## 2023-12-22 DIAGNOSIS — S72491D Other fracture of lower end of right femur, subsequent encounter for closed fracture with routine healing: Secondary | ICD-10-CM | POA: Diagnosis not present

## 2023-12-22 DIAGNOSIS — M19042 Primary osteoarthritis, left hand: Secondary | ICD-10-CM | POA: Diagnosis not present

## 2023-12-22 DIAGNOSIS — I5032 Chronic diastolic (congestive) heart failure: Secondary | ICD-10-CM | POA: Diagnosis not present

## 2023-12-22 DIAGNOSIS — M1712 Unilateral primary osteoarthritis, left knee: Secondary | ICD-10-CM | POA: Diagnosis not present

## 2023-12-22 DIAGNOSIS — M8589 Other specified disorders of bone density and structure, multiple sites: Secondary | ICD-10-CM | POA: Diagnosis not present

## 2023-12-22 DIAGNOSIS — H4052X3 Glaucoma secondary to other eye disorders, left eye, severe stage: Secondary | ICD-10-CM | POA: Diagnosis not present

## 2023-12-29 DIAGNOSIS — S82852D Displaced trimalleolar fracture of left lower leg, subsequent encounter for closed fracture with routine healing: Secondary | ICD-10-CM | POA: Diagnosis not present

## 2023-12-29 DIAGNOSIS — I5032 Chronic diastolic (congestive) heart failure: Secondary | ICD-10-CM | POA: Diagnosis not present

## 2023-12-29 DIAGNOSIS — I4891 Unspecified atrial fibrillation: Secondary | ICD-10-CM | POA: Diagnosis not present

## 2023-12-29 DIAGNOSIS — Z556 Problems related to health literacy: Secondary | ICD-10-CM | POA: Diagnosis not present

## 2023-12-29 DIAGNOSIS — E785 Hyperlipidemia, unspecified: Secondary | ICD-10-CM | POA: Diagnosis not present

## 2023-12-29 DIAGNOSIS — M8589 Other specified disorders of bone density and structure, multiple sites: Secondary | ICD-10-CM | POA: Diagnosis not present

## 2023-12-29 DIAGNOSIS — E669 Obesity, unspecified: Secondary | ICD-10-CM | POA: Diagnosis not present

## 2023-12-29 DIAGNOSIS — E109 Type 1 diabetes mellitus without complications: Secondary | ICD-10-CM | POA: Diagnosis not present

## 2023-12-29 DIAGNOSIS — S72491D Other fracture of lower end of right femur, subsequent encounter for closed fracture with routine healing: Secondary | ICD-10-CM | POA: Diagnosis not present

## 2023-12-29 DIAGNOSIS — I110 Hypertensive heart disease with heart failure: Secondary | ICD-10-CM | POA: Diagnosis not present

## 2023-12-29 DIAGNOSIS — M19042 Primary osteoarthritis, left hand: Secondary | ICD-10-CM | POA: Diagnosis not present

## 2023-12-29 DIAGNOSIS — M1712 Unilateral primary osteoarthritis, left knee: Secondary | ICD-10-CM | POA: Diagnosis not present

## 2023-12-29 DIAGNOSIS — F411 Generalized anxiety disorder: Secondary | ICD-10-CM | POA: Diagnosis not present

## 2023-12-29 DIAGNOSIS — H4052X3 Glaucoma secondary to other eye disorders, left eye, severe stage: Secondary | ICD-10-CM | POA: Diagnosis not present

## 2023-12-29 DIAGNOSIS — S91002D Unspecified open wound, left ankle, subsequent encounter: Secondary | ICD-10-CM | POA: Diagnosis not present

## 2023-12-29 DIAGNOSIS — Z8616 Personal history of COVID-19: Secondary | ICD-10-CM | POA: Diagnosis not present

## 2023-12-30 DIAGNOSIS — I5032 Chronic diastolic (congestive) heart failure: Secondary | ICD-10-CM | POA: Diagnosis not present

## 2023-12-30 DIAGNOSIS — E669 Obesity, unspecified: Secondary | ICD-10-CM | POA: Diagnosis not present

## 2023-12-30 DIAGNOSIS — S72491D Other fracture of lower end of right femur, subsequent encounter for closed fracture with routine healing: Secondary | ICD-10-CM | POA: Diagnosis not present

## 2023-12-30 DIAGNOSIS — M19042 Primary osteoarthritis, left hand: Secondary | ICD-10-CM | POA: Diagnosis not present

## 2023-12-30 DIAGNOSIS — Z556 Problems related to health literacy: Secondary | ICD-10-CM | POA: Diagnosis not present

## 2023-12-30 DIAGNOSIS — S82852D Displaced trimalleolar fracture of left lower leg, subsequent encounter for closed fracture with routine healing: Secondary | ICD-10-CM | POA: Diagnosis not present

## 2023-12-30 DIAGNOSIS — S91002D Unspecified open wound, left ankle, subsequent encounter: Secondary | ICD-10-CM | POA: Diagnosis not present

## 2023-12-30 DIAGNOSIS — M1712 Unilateral primary osteoarthritis, left knee: Secondary | ICD-10-CM | POA: Diagnosis not present

## 2023-12-30 DIAGNOSIS — I4891 Unspecified atrial fibrillation: Secondary | ICD-10-CM | POA: Diagnosis not present

## 2023-12-30 DIAGNOSIS — Z8616 Personal history of COVID-19: Secondary | ICD-10-CM | POA: Diagnosis not present

## 2023-12-30 DIAGNOSIS — M8589 Other specified disorders of bone density and structure, multiple sites: Secondary | ICD-10-CM | POA: Diagnosis not present

## 2023-12-30 DIAGNOSIS — E785 Hyperlipidemia, unspecified: Secondary | ICD-10-CM | POA: Diagnosis not present

## 2023-12-30 DIAGNOSIS — F411 Generalized anxiety disorder: Secondary | ICD-10-CM | POA: Diagnosis not present

## 2023-12-30 DIAGNOSIS — E109 Type 1 diabetes mellitus without complications: Secondary | ICD-10-CM | POA: Diagnosis not present

## 2023-12-30 DIAGNOSIS — I110 Hypertensive heart disease with heart failure: Secondary | ICD-10-CM | POA: Diagnosis not present

## 2023-12-30 DIAGNOSIS — H4052X3 Glaucoma secondary to other eye disorders, left eye, severe stage: Secondary | ICD-10-CM | POA: Diagnosis not present

## 2023-12-31 DIAGNOSIS — S91002D Unspecified open wound, left ankle, subsequent encounter: Secondary | ICD-10-CM | POA: Diagnosis not present

## 2023-12-31 DIAGNOSIS — M19042 Primary osteoarthritis, left hand: Secondary | ICD-10-CM | POA: Diagnosis not present

## 2023-12-31 DIAGNOSIS — M1712 Unilateral primary osteoarthritis, left knee: Secondary | ICD-10-CM | POA: Diagnosis not present

## 2023-12-31 DIAGNOSIS — E109 Type 1 diabetes mellitus without complications: Secondary | ICD-10-CM | POA: Diagnosis not present

## 2023-12-31 DIAGNOSIS — S82852D Displaced trimalleolar fracture of left lower leg, subsequent encounter for closed fracture with routine healing: Secondary | ICD-10-CM | POA: Diagnosis not present

## 2023-12-31 DIAGNOSIS — H4052X3 Glaucoma secondary to other eye disorders, left eye, severe stage: Secondary | ICD-10-CM | POA: Diagnosis not present

## 2023-12-31 DIAGNOSIS — I5032 Chronic diastolic (congestive) heart failure: Secondary | ICD-10-CM | POA: Diagnosis not present

## 2023-12-31 DIAGNOSIS — S72491D Other fracture of lower end of right femur, subsequent encounter for closed fracture with routine healing: Secondary | ICD-10-CM | POA: Diagnosis not present

## 2023-12-31 DIAGNOSIS — Z556 Problems related to health literacy: Secondary | ICD-10-CM | POA: Diagnosis not present

## 2023-12-31 DIAGNOSIS — I110 Hypertensive heart disease with heart failure: Secondary | ICD-10-CM | POA: Diagnosis not present

## 2023-12-31 DIAGNOSIS — E669 Obesity, unspecified: Secondary | ICD-10-CM | POA: Diagnosis not present

## 2023-12-31 DIAGNOSIS — E785 Hyperlipidemia, unspecified: Secondary | ICD-10-CM | POA: Diagnosis not present

## 2023-12-31 DIAGNOSIS — Z8616 Personal history of COVID-19: Secondary | ICD-10-CM | POA: Diagnosis not present

## 2023-12-31 DIAGNOSIS — M8589 Other specified disorders of bone density and structure, multiple sites: Secondary | ICD-10-CM | POA: Diagnosis not present

## 2023-12-31 DIAGNOSIS — I4891 Unspecified atrial fibrillation: Secondary | ICD-10-CM | POA: Diagnosis not present

## 2023-12-31 DIAGNOSIS — F411 Generalized anxiety disorder: Secondary | ICD-10-CM | POA: Diagnosis not present

## 2024-01-17 DIAGNOSIS — I5032 Chronic diastolic (congestive) heart failure: Secondary | ICD-10-CM | POA: Diagnosis not present

## 2024-01-17 DIAGNOSIS — R278 Other lack of coordination: Secondary | ICD-10-CM | POA: Diagnosis not present

## 2024-01-17 DIAGNOSIS — M19042 Primary osteoarthritis, left hand: Secondary | ICD-10-CM | POA: Diagnosis not present

## 2024-01-17 DIAGNOSIS — H4052X3 Glaucoma secondary to other eye disorders, left eye, severe stage: Secondary | ICD-10-CM | POA: Diagnosis not present

## 2024-01-17 DIAGNOSIS — R262 Difficulty in walking, not elsewhere classified: Secondary | ICD-10-CM | POA: Diagnosis not present

## 2024-01-17 DIAGNOSIS — M6281 Muscle weakness (generalized): Secondary | ICD-10-CM | POA: Diagnosis not present

## 2024-01-17 DIAGNOSIS — R2681 Unsteadiness on feet: Secondary | ICD-10-CM | POA: Diagnosis not present

## 2024-01-17 DIAGNOSIS — M21371 Foot drop, right foot: Secondary | ICD-10-CM | POA: Diagnosis not present

## 2024-01-19 DIAGNOSIS — M6281 Muscle weakness (generalized): Secondary | ICD-10-CM | POA: Diagnosis not present

## 2024-01-19 DIAGNOSIS — M19042 Primary osteoarthritis, left hand: Secondary | ICD-10-CM | POA: Diagnosis not present

## 2024-01-19 DIAGNOSIS — R262 Difficulty in walking, not elsewhere classified: Secondary | ICD-10-CM | POA: Diagnosis not present

## 2024-01-19 DIAGNOSIS — H4052X3 Glaucoma secondary to other eye disorders, left eye, severe stage: Secondary | ICD-10-CM | POA: Diagnosis not present

## 2024-01-19 DIAGNOSIS — R2681 Unsteadiness on feet: Secondary | ICD-10-CM | POA: Diagnosis not present

## 2024-01-19 DIAGNOSIS — M21371 Foot drop, right foot: Secondary | ICD-10-CM | POA: Diagnosis not present

## 2024-01-19 DIAGNOSIS — R278 Other lack of coordination: Secondary | ICD-10-CM | POA: Diagnosis not present

## 2024-01-19 DIAGNOSIS — I5032 Chronic diastolic (congestive) heart failure: Secondary | ICD-10-CM | POA: Diagnosis not present

## 2024-01-24 DIAGNOSIS — M6281 Muscle weakness (generalized): Secondary | ICD-10-CM | POA: Diagnosis not present

## 2024-01-24 DIAGNOSIS — I5032 Chronic diastolic (congestive) heart failure: Secondary | ICD-10-CM | POA: Diagnosis not present

## 2024-01-24 DIAGNOSIS — R2681 Unsteadiness on feet: Secondary | ICD-10-CM | POA: Diagnosis not present

## 2024-01-24 DIAGNOSIS — H4052X3 Glaucoma secondary to other eye disorders, left eye, severe stage: Secondary | ICD-10-CM | POA: Diagnosis not present

## 2024-01-24 DIAGNOSIS — R278 Other lack of coordination: Secondary | ICD-10-CM | POA: Diagnosis not present

## 2024-01-24 DIAGNOSIS — M21371 Foot drop, right foot: Secondary | ICD-10-CM | POA: Diagnosis not present

## 2024-01-24 DIAGNOSIS — R262 Difficulty in walking, not elsewhere classified: Secondary | ICD-10-CM | POA: Diagnosis not present

## 2024-01-24 DIAGNOSIS — M19042 Primary osteoarthritis, left hand: Secondary | ICD-10-CM | POA: Diagnosis not present

## 2024-01-25 DIAGNOSIS — R262 Difficulty in walking, not elsewhere classified: Secondary | ICD-10-CM | POA: Diagnosis not present

## 2024-01-25 DIAGNOSIS — M6281 Muscle weakness (generalized): Secondary | ICD-10-CM | POA: Diagnosis not present

## 2024-01-25 DIAGNOSIS — R851 Abnormal level of hormones in specimens from digestive organs and abdominal cavity: Secondary | ICD-10-CM | POA: Diagnosis not present

## 2024-01-25 DIAGNOSIS — M1712 Unilateral primary osteoarthritis, left knee: Secondary | ICD-10-CM | POA: Diagnosis not present

## 2024-01-25 DIAGNOSIS — M21371 Foot drop, right foot: Secondary | ICD-10-CM | POA: Diagnosis not present

## 2024-01-25 DIAGNOSIS — R278 Other lack of coordination: Secondary | ICD-10-CM | POA: Diagnosis not present

## 2024-01-25 DIAGNOSIS — R2681 Unsteadiness on feet: Secondary | ICD-10-CM | POA: Diagnosis not present

## 2024-01-25 DIAGNOSIS — H4052X3 Glaucoma secondary to other eye disorders, left eye, severe stage: Secondary | ICD-10-CM | POA: Diagnosis not present

## 2024-01-25 DIAGNOSIS — I4891 Unspecified atrial fibrillation: Secondary | ICD-10-CM | POA: Diagnosis not present

## 2024-01-25 DIAGNOSIS — M19042 Primary osteoarthritis, left hand: Secondary | ICD-10-CM | POA: Diagnosis not present

## 2024-01-25 DIAGNOSIS — I5032 Chronic diastolic (congestive) heart failure: Secondary | ICD-10-CM | POA: Diagnosis not present

## 2024-01-26 DIAGNOSIS — R278 Other lack of coordination: Secondary | ICD-10-CM | POA: Diagnosis not present

## 2024-01-26 DIAGNOSIS — M21371 Foot drop, right foot: Secondary | ICD-10-CM | POA: Diagnosis not present

## 2024-01-26 DIAGNOSIS — R2681 Unsteadiness on feet: Secondary | ICD-10-CM | POA: Diagnosis not present

## 2024-01-26 DIAGNOSIS — R262 Difficulty in walking, not elsewhere classified: Secondary | ICD-10-CM | POA: Diagnosis not present

## 2024-01-26 DIAGNOSIS — I5032 Chronic diastolic (congestive) heart failure: Secondary | ICD-10-CM | POA: Diagnosis not present

## 2024-01-26 DIAGNOSIS — H4052X3 Glaucoma secondary to other eye disorders, left eye, severe stage: Secondary | ICD-10-CM | POA: Diagnosis not present

## 2024-01-26 DIAGNOSIS — M6281 Muscle weakness (generalized): Secondary | ICD-10-CM | POA: Diagnosis not present

## 2024-01-26 DIAGNOSIS — M19042 Primary osteoarthritis, left hand: Secondary | ICD-10-CM | POA: Diagnosis not present

## 2024-01-27 DIAGNOSIS — R278 Other lack of coordination: Secondary | ICD-10-CM | POA: Diagnosis not present

## 2024-01-27 DIAGNOSIS — I5032 Chronic diastolic (congestive) heart failure: Secondary | ICD-10-CM | POA: Diagnosis not present

## 2024-01-27 DIAGNOSIS — R2681 Unsteadiness on feet: Secondary | ICD-10-CM | POA: Diagnosis not present

## 2024-01-27 DIAGNOSIS — M6281 Muscle weakness (generalized): Secondary | ICD-10-CM | POA: Diagnosis not present

## 2024-01-27 DIAGNOSIS — H4052X3 Glaucoma secondary to other eye disorders, left eye, severe stage: Secondary | ICD-10-CM | POA: Diagnosis not present

## 2024-01-27 DIAGNOSIS — M21371 Foot drop, right foot: Secondary | ICD-10-CM | POA: Diagnosis not present

## 2024-01-27 DIAGNOSIS — M19042 Primary osteoarthritis, left hand: Secondary | ICD-10-CM | POA: Diagnosis not present

## 2024-01-27 DIAGNOSIS — R262 Difficulty in walking, not elsewhere classified: Secondary | ICD-10-CM | POA: Diagnosis not present

## 2024-01-31 DIAGNOSIS — H4052X3 Glaucoma secondary to other eye disorders, left eye, severe stage: Secondary | ICD-10-CM | POA: Diagnosis not present

## 2024-01-31 DIAGNOSIS — R278 Other lack of coordination: Secondary | ICD-10-CM | POA: Diagnosis not present

## 2024-01-31 DIAGNOSIS — M21371 Foot drop, right foot: Secondary | ICD-10-CM | POA: Diagnosis not present

## 2024-01-31 DIAGNOSIS — R2681 Unsteadiness on feet: Secondary | ICD-10-CM | POA: Diagnosis not present

## 2024-01-31 DIAGNOSIS — M6281 Muscle weakness (generalized): Secondary | ICD-10-CM | POA: Diagnosis not present

## 2024-01-31 DIAGNOSIS — R262 Difficulty in walking, not elsewhere classified: Secondary | ICD-10-CM | POA: Diagnosis not present

## 2024-01-31 DIAGNOSIS — M19042 Primary osteoarthritis, left hand: Secondary | ICD-10-CM | POA: Diagnosis not present

## 2024-01-31 DIAGNOSIS — I5032 Chronic diastolic (congestive) heart failure: Secondary | ICD-10-CM | POA: Diagnosis not present

## 2024-02-01 DIAGNOSIS — M6281 Muscle weakness (generalized): Secondary | ICD-10-CM | POA: Diagnosis not present

## 2024-02-01 DIAGNOSIS — H4052X3 Glaucoma secondary to other eye disorders, left eye, severe stage: Secondary | ICD-10-CM | POA: Diagnosis not present

## 2024-02-01 DIAGNOSIS — R2681 Unsteadiness on feet: Secondary | ICD-10-CM | POA: Diagnosis not present

## 2024-02-01 DIAGNOSIS — I5032 Chronic diastolic (congestive) heart failure: Secondary | ICD-10-CM | POA: Diagnosis not present

## 2024-02-01 DIAGNOSIS — M21371 Foot drop, right foot: Secondary | ICD-10-CM | POA: Diagnosis not present

## 2024-02-01 DIAGNOSIS — R262 Difficulty in walking, not elsewhere classified: Secondary | ICD-10-CM | POA: Diagnosis not present

## 2024-02-01 DIAGNOSIS — R278 Other lack of coordination: Secondary | ICD-10-CM | POA: Diagnosis not present

## 2024-02-01 DIAGNOSIS — M19042 Primary osteoarthritis, left hand: Secondary | ICD-10-CM | POA: Diagnosis not present

## 2024-02-02 DIAGNOSIS — R262 Difficulty in walking, not elsewhere classified: Secondary | ICD-10-CM | POA: Diagnosis not present

## 2024-02-02 DIAGNOSIS — R278 Other lack of coordination: Secondary | ICD-10-CM | POA: Diagnosis not present

## 2024-02-02 DIAGNOSIS — M21371 Foot drop, right foot: Secondary | ICD-10-CM | POA: Diagnosis not present

## 2024-02-02 DIAGNOSIS — M19042 Primary osteoarthritis, left hand: Secondary | ICD-10-CM | POA: Diagnosis not present

## 2024-02-02 DIAGNOSIS — R2681 Unsteadiness on feet: Secondary | ICD-10-CM | POA: Diagnosis not present

## 2024-02-02 DIAGNOSIS — I5032 Chronic diastolic (congestive) heart failure: Secondary | ICD-10-CM | POA: Diagnosis not present

## 2024-02-02 DIAGNOSIS — M6281 Muscle weakness (generalized): Secondary | ICD-10-CM | POA: Diagnosis not present

## 2024-02-02 DIAGNOSIS — H4052X3 Glaucoma secondary to other eye disorders, left eye, severe stage: Secondary | ICD-10-CM | POA: Diagnosis not present

## 2024-02-03 DIAGNOSIS — I5032 Chronic diastolic (congestive) heart failure: Secondary | ICD-10-CM | POA: Diagnosis not present

## 2024-02-03 DIAGNOSIS — R2681 Unsteadiness on feet: Secondary | ICD-10-CM | POA: Diagnosis not present

## 2024-02-03 DIAGNOSIS — H4052X3 Glaucoma secondary to other eye disorders, left eye, severe stage: Secondary | ICD-10-CM | POA: Diagnosis not present

## 2024-02-03 DIAGNOSIS — R278 Other lack of coordination: Secondary | ICD-10-CM | POA: Diagnosis not present

## 2024-02-03 DIAGNOSIS — R262 Difficulty in walking, not elsewhere classified: Secondary | ICD-10-CM | POA: Diagnosis not present

## 2024-02-03 DIAGNOSIS — M19042 Primary osteoarthritis, left hand: Secondary | ICD-10-CM | POA: Diagnosis not present

## 2024-02-03 DIAGNOSIS — M21371 Foot drop, right foot: Secondary | ICD-10-CM | POA: Diagnosis not present

## 2024-02-03 DIAGNOSIS — M6281 Muscle weakness (generalized): Secondary | ICD-10-CM | POA: Diagnosis not present

## 2024-02-08 DIAGNOSIS — R278 Other lack of coordination: Secondary | ICD-10-CM | POA: Diagnosis not present

## 2024-02-08 DIAGNOSIS — H4052X3 Glaucoma secondary to other eye disorders, left eye, severe stage: Secondary | ICD-10-CM | POA: Diagnosis not present

## 2024-02-08 DIAGNOSIS — I5032 Chronic diastolic (congestive) heart failure: Secondary | ICD-10-CM | POA: Diagnosis not present

## 2024-02-08 DIAGNOSIS — M21371 Foot drop, right foot: Secondary | ICD-10-CM | POA: Diagnosis not present

## 2024-02-08 DIAGNOSIS — M6281 Muscle weakness (generalized): Secondary | ICD-10-CM | POA: Diagnosis not present

## 2024-02-08 DIAGNOSIS — M19042 Primary osteoarthritis, left hand: Secondary | ICD-10-CM | POA: Diagnosis not present

## 2024-02-08 DIAGNOSIS — R262 Difficulty in walking, not elsewhere classified: Secondary | ICD-10-CM | POA: Diagnosis not present

## 2024-02-08 DIAGNOSIS — R2681 Unsteadiness on feet: Secondary | ICD-10-CM | POA: Diagnosis not present

## 2024-02-09 DIAGNOSIS — H4052X3 Glaucoma secondary to other eye disorders, left eye, severe stage: Secondary | ICD-10-CM | POA: Diagnosis not present

## 2024-02-09 DIAGNOSIS — R278 Other lack of coordination: Secondary | ICD-10-CM | POA: Diagnosis not present

## 2024-02-09 DIAGNOSIS — R2681 Unsteadiness on feet: Secondary | ICD-10-CM | POA: Diagnosis not present

## 2024-02-09 DIAGNOSIS — M21371 Foot drop, right foot: Secondary | ICD-10-CM | POA: Diagnosis not present

## 2024-02-09 DIAGNOSIS — M19042 Primary osteoarthritis, left hand: Secondary | ICD-10-CM | POA: Diagnosis not present

## 2024-02-09 DIAGNOSIS — M6281 Muscle weakness (generalized): Secondary | ICD-10-CM | POA: Diagnosis not present

## 2024-02-09 DIAGNOSIS — I5032 Chronic diastolic (congestive) heart failure: Secondary | ICD-10-CM | POA: Diagnosis not present

## 2024-02-09 DIAGNOSIS — R262 Difficulty in walking, not elsewhere classified: Secondary | ICD-10-CM | POA: Diagnosis not present

## 2024-02-10 DIAGNOSIS — R2681 Unsteadiness on feet: Secondary | ICD-10-CM | POA: Diagnosis not present

## 2024-02-10 DIAGNOSIS — M21371 Foot drop, right foot: Secondary | ICD-10-CM | POA: Diagnosis not present

## 2024-02-10 DIAGNOSIS — M19042 Primary osteoarthritis, left hand: Secondary | ICD-10-CM | POA: Diagnosis not present

## 2024-02-10 DIAGNOSIS — R262 Difficulty in walking, not elsewhere classified: Secondary | ICD-10-CM | POA: Diagnosis not present

## 2024-02-10 DIAGNOSIS — I5032 Chronic diastolic (congestive) heart failure: Secondary | ICD-10-CM | POA: Diagnosis not present

## 2024-02-10 DIAGNOSIS — H4052X3 Glaucoma secondary to other eye disorders, left eye, severe stage: Secondary | ICD-10-CM | POA: Diagnosis not present

## 2024-02-10 DIAGNOSIS — R278 Other lack of coordination: Secondary | ICD-10-CM | POA: Diagnosis not present

## 2024-02-10 DIAGNOSIS — M6281 Muscle weakness (generalized): Secondary | ICD-10-CM | POA: Diagnosis not present

## 2024-02-14 DIAGNOSIS — S72401D Unspecified fracture of lower end of right femur, subsequent encounter for closed fracture with routine healing: Secondary | ICD-10-CM | POA: Diagnosis not present

## 2024-02-14 DIAGNOSIS — S82852D Displaced trimalleolar fracture of left lower leg, subsequent encounter for closed fracture with routine healing: Secondary | ICD-10-CM | POA: Diagnosis not present

## 2024-02-14 DIAGNOSIS — M79604 Pain in right leg: Secondary | ICD-10-CM | POA: Diagnosis not present

## 2024-02-15 DIAGNOSIS — I5032 Chronic diastolic (congestive) heart failure: Secondary | ICD-10-CM | POA: Diagnosis not present

## 2024-02-15 DIAGNOSIS — R2681 Unsteadiness on feet: Secondary | ICD-10-CM | POA: Diagnosis not present

## 2024-02-15 DIAGNOSIS — R278 Other lack of coordination: Secondary | ICD-10-CM | POA: Diagnosis not present

## 2024-02-15 DIAGNOSIS — M6281 Muscle weakness (generalized): Secondary | ICD-10-CM | POA: Diagnosis not present

## 2024-02-15 DIAGNOSIS — M19042 Primary osteoarthritis, left hand: Secondary | ICD-10-CM | POA: Diagnosis not present

## 2024-02-15 DIAGNOSIS — R262 Difficulty in walking, not elsewhere classified: Secondary | ICD-10-CM | POA: Diagnosis not present

## 2024-02-15 DIAGNOSIS — H4052X3 Glaucoma secondary to other eye disorders, left eye, severe stage: Secondary | ICD-10-CM | POA: Diagnosis not present

## 2024-02-15 DIAGNOSIS — M21371 Foot drop, right foot: Secondary | ICD-10-CM | POA: Diagnosis not present

## 2024-02-16 DIAGNOSIS — H4052X3 Glaucoma secondary to other eye disorders, left eye, severe stage: Secondary | ICD-10-CM | POA: Diagnosis not present

## 2024-02-16 DIAGNOSIS — M21371 Foot drop, right foot: Secondary | ICD-10-CM | POA: Diagnosis not present

## 2024-02-16 DIAGNOSIS — R2681 Unsteadiness on feet: Secondary | ICD-10-CM | POA: Diagnosis not present

## 2024-02-16 DIAGNOSIS — I5032 Chronic diastolic (congestive) heart failure: Secondary | ICD-10-CM | POA: Diagnosis not present

## 2024-02-16 DIAGNOSIS — R262 Difficulty in walking, not elsewhere classified: Secondary | ICD-10-CM | POA: Diagnosis not present

## 2024-02-16 DIAGNOSIS — M6281 Muscle weakness (generalized): Secondary | ICD-10-CM | POA: Diagnosis not present

## 2024-02-16 DIAGNOSIS — M19042 Primary osteoarthritis, left hand: Secondary | ICD-10-CM | POA: Diagnosis not present

## 2024-02-16 DIAGNOSIS — R278 Other lack of coordination: Secondary | ICD-10-CM | POA: Diagnosis not present

## 2024-02-17 DIAGNOSIS — R2681 Unsteadiness on feet: Secondary | ICD-10-CM | POA: Diagnosis not present

## 2024-02-17 DIAGNOSIS — H4052X3 Glaucoma secondary to other eye disorders, left eye, severe stage: Secondary | ICD-10-CM | POA: Diagnosis not present

## 2024-02-17 DIAGNOSIS — R262 Difficulty in walking, not elsewhere classified: Secondary | ICD-10-CM | POA: Diagnosis not present

## 2024-02-17 DIAGNOSIS — M19042 Primary osteoarthritis, left hand: Secondary | ICD-10-CM | POA: Diagnosis not present

## 2024-02-17 DIAGNOSIS — R278 Other lack of coordination: Secondary | ICD-10-CM | POA: Diagnosis not present

## 2024-02-17 DIAGNOSIS — M21371 Foot drop, right foot: Secondary | ICD-10-CM | POA: Diagnosis not present

## 2024-02-17 DIAGNOSIS — M6281 Muscle weakness (generalized): Secondary | ICD-10-CM | POA: Diagnosis not present

## 2024-02-17 DIAGNOSIS — I5032 Chronic diastolic (congestive) heart failure: Secondary | ICD-10-CM | POA: Diagnosis not present

## 2024-02-22 DIAGNOSIS — M6281 Muscle weakness (generalized): Secondary | ICD-10-CM | POA: Diagnosis not present

## 2024-02-22 DIAGNOSIS — M21371 Foot drop, right foot: Secondary | ICD-10-CM | POA: Diagnosis not present

## 2024-02-22 DIAGNOSIS — H4052X3 Glaucoma secondary to other eye disorders, left eye, severe stage: Secondary | ICD-10-CM | POA: Diagnosis not present

## 2024-02-22 DIAGNOSIS — I5032 Chronic diastolic (congestive) heart failure: Secondary | ICD-10-CM | POA: Diagnosis not present

## 2024-02-22 DIAGNOSIS — R262 Difficulty in walking, not elsewhere classified: Secondary | ICD-10-CM | POA: Diagnosis not present

## 2024-02-22 DIAGNOSIS — M19042 Primary osteoarthritis, left hand: Secondary | ICD-10-CM | POA: Diagnosis not present

## 2024-02-22 DIAGNOSIS — R278 Other lack of coordination: Secondary | ICD-10-CM | POA: Diagnosis not present

## 2024-02-22 DIAGNOSIS — R2681 Unsteadiness on feet: Secondary | ICD-10-CM | POA: Diagnosis not present

## 2024-02-23 DIAGNOSIS — R262 Difficulty in walking, not elsewhere classified: Secondary | ICD-10-CM | POA: Diagnosis not present

## 2024-02-23 DIAGNOSIS — M19042 Primary osteoarthritis, left hand: Secondary | ICD-10-CM | POA: Diagnosis not present

## 2024-02-23 DIAGNOSIS — M6281 Muscle weakness (generalized): Secondary | ICD-10-CM | POA: Diagnosis not present

## 2024-02-23 DIAGNOSIS — M21371 Foot drop, right foot: Secondary | ICD-10-CM | POA: Diagnosis not present

## 2024-02-23 DIAGNOSIS — H4052X3 Glaucoma secondary to other eye disorders, left eye, severe stage: Secondary | ICD-10-CM | POA: Diagnosis not present

## 2024-02-23 DIAGNOSIS — R278 Other lack of coordination: Secondary | ICD-10-CM | POA: Diagnosis not present

## 2024-02-23 DIAGNOSIS — I5032 Chronic diastolic (congestive) heart failure: Secondary | ICD-10-CM | POA: Diagnosis not present

## 2024-02-23 DIAGNOSIS — R2681 Unsteadiness on feet: Secondary | ICD-10-CM | POA: Diagnosis not present

## 2024-02-24 DIAGNOSIS — R278 Other lack of coordination: Secondary | ICD-10-CM | POA: Diagnosis not present

## 2024-02-24 DIAGNOSIS — M6281 Muscle weakness (generalized): Secondary | ICD-10-CM | POA: Diagnosis not present

## 2024-02-24 DIAGNOSIS — M19042 Primary osteoarthritis, left hand: Secondary | ICD-10-CM | POA: Diagnosis not present

## 2024-02-24 DIAGNOSIS — R2681 Unsteadiness on feet: Secondary | ICD-10-CM | POA: Diagnosis not present

## 2024-02-24 DIAGNOSIS — I5032 Chronic diastolic (congestive) heart failure: Secondary | ICD-10-CM | POA: Diagnosis not present

## 2024-02-24 DIAGNOSIS — R262 Difficulty in walking, not elsewhere classified: Secondary | ICD-10-CM | POA: Diagnosis not present

## 2024-02-24 DIAGNOSIS — H4052X3 Glaucoma secondary to other eye disorders, left eye, severe stage: Secondary | ICD-10-CM | POA: Diagnosis not present

## 2024-02-24 DIAGNOSIS — M21371 Foot drop, right foot: Secondary | ICD-10-CM | POA: Diagnosis not present

## 2024-02-25 DIAGNOSIS — M19042 Primary osteoarthritis, left hand: Secondary | ICD-10-CM | POA: Diagnosis not present

## 2024-02-25 DIAGNOSIS — M21371 Foot drop, right foot: Secondary | ICD-10-CM | POA: Diagnosis not present

## 2024-02-25 DIAGNOSIS — R262 Difficulty in walking, not elsewhere classified: Secondary | ICD-10-CM | POA: Diagnosis not present

## 2024-02-25 DIAGNOSIS — R2681 Unsteadiness on feet: Secondary | ICD-10-CM | POA: Diagnosis not present

## 2024-02-25 DIAGNOSIS — H4052X3 Glaucoma secondary to other eye disorders, left eye, severe stage: Secondary | ICD-10-CM | POA: Diagnosis not present

## 2024-02-25 DIAGNOSIS — I5032 Chronic diastolic (congestive) heart failure: Secondary | ICD-10-CM | POA: Diagnosis not present

## 2024-02-25 DIAGNOSIS — M6281 Muscle weakness (generalized): Secondary | ICD-10-CM | POA: Diagnosis not present

## 2024-02-25 DIAGNOSIS — R278 Other lack of coordination: Secondary | ICD-10-CM | POA: Diagnosis not present

## 2024-02-29 DIAGNOSIS — M21371 Foot drop, right foot: Secondary | ICD-10-CM | POA: Diagnosis not present

## 2024-02-29 DIAGNOSIS — M6281 Muscle weakness (generalized): Secondary | ICD-10-CM | POA: Diagnosis not present

## 2024-02-29 DIAGNOSIS — R2681 Unsteadiness on feet: Secondary | ICD-10-CM | POA: Diagnosis not present

## 2024-02-29 DIAGNOSIS — R262 Difficulty in walking, not elsewhere classified: Secondary | ICD-10-CM | POA: Diagnosis not present

## 2024-02-29 DIAGNOSIS — R278 Other lack of coordination: Secondary | ICD-10-CM | POA: Diagnosis not present

## 2024-02-29 DIAGNOSIS — H4052X3 Glaucoma secondary to other eye disorders, left eye, severe stage: Secondary | ICD-10-CM | POA: Diagnosis not present

## 2024-02-29 DIAGNOSIS — M19042 Primary osteoarthritis, left hand: Secondary | ICD-10-CM | POA: Diagnosis not present

## 2024-02-29 DIAGNOSIS — I5032 Chronic diastolic (congestive) heart failure: Secondary | ICD-10-CM | POA: Diagnosis not present

## 2024-03-01 DIAGNOSIS — M21371 Foot drop, right foot: Secondary | ICD-10-CM | POA: Diagnosis not present

## 2024-03-01 DIAGNOSIS — R262 Difficulty in walking, not elsewhere classified: Secondary | ICD-10-CM | POA: Diagnosis not present

## 2024-03-01 DIAGNOSIS — H4052X3 Glaucoma secondary to other eye disorders, left eye, severe stage: Secondary | ICD-10-CM | POA: Diagnosis not present

## 2024-03-01 DIAGNOSIS — M19042 Primary osteoarthritis, left hand: Secondary | ICD-10-CM | POA: Diagnosis not present

## 2024-03-01 DIAGNOSIS — R278 Other lack of coordination: Secondary | ICD-10-CM | POA: Diagnosis not present

## 2024-03-01 DIAGNOSIS — R2681 Unsteadiness on feet: Secondary | ICD-10-CM | POA: Diagnosis not present

## 2024-03-01 DIAGNOSIS — I5032 Chronic diastolic (congestive) heart failure: Secondary | ICD-10-CM | POA: Diagnosis not present

## 2024-03-01 DIAGNOSIS — M6281 Muscle weakness (generalized): Secondary | ICD-10-CM | POA: Diagnosis not present

## 2024-03-02 DIAGNOSIS — R278 Other lack of coordination: Secondary | ICD-10-CM | POA: Diagnosis not present

## 2024-03-02 DIAGNOSIS — I5032 Chronic diastolic (congestive) heart failure: Secondary | ICD-10-CM | POA: Diagnosis not present

## 2024-03-02 DIAGNOSIS — M21371 Foot drop, right foot: Secondary | ICD-10-CM | POA: Diagnosis not present

## 2024-03-02 DIAGNOSIS — R2681 Unsteadiness on feet: Secondary | ICD-10-CM | POA: Diagnosis not present

## 2024-03-02 DIAGNOSIS — H4052X3 Glaucoma secondary to other eye disorders, left eye, severe stage: Secondary | ICD-10-CM | POA: Diagnosis not present

## 2024-03-02 DIAGNOSIS — M6281 Muscle weakness (generalized): Secondary | ICD-10-CM | POA: Diagnosis not present

## 2024-03-02 DIAGNOSIS — R262 Difficulty in walking, not elsewhere classified: Secondary | ICD-10-CM | POA: Diagnosis not present

## 2024-03-02 DIAGNOSIS — M19042 Primary osteoarthritis, left hand: Secondary | ICD-10-CM | POA: Diagnosis not present

## 2024-03-07 DIAGNOSIS — E1051 Type 1 diabetes mellitus with diabetic peripheral angiopathy without gangrene: Secondary | ICD-10-CM | POA: Diagnosis not present

## 2024-03-07 DIAGNOSIS — M8580 Other specified disorders of bone density and structure, unspecified site: Secondary | ICD-10-CM | POA: Diagnosis not present

## 2024-03-07 DIAGNOSIS — E785 Hyperlipidemia, unspecified: Secondary | ICD-10-CM | POA: Diagnosis not present

## 2024-03-08 DIAGNOSIS — R2681 Unsteadiness on feet: Secondary | ICD-10-CM | POA: Diagnosis not present

## 2024-03-08 DIAGNOSIS — I5032 Chronic diastolic (congestive) heart failure: Secondary | ICD-10-CM | POA: Diagnosis not present

## 2024-03-08 DIAGNOSIS — R278 Other lack of coordination: Secondary | ICD-10-CM | POA: Diagnosis not present

## 2024-03-08 DIAGNOSIS — M19042 Primary osteoarthritis, left hand: Secondary | ICD-10-CM | POA: Diagnosis not present

## 2024-03-08 DIAGNOSIS — M6281 Muscle weakness (generalized): Secondary | ICD-10-CM | POA: Diagnosis not present

## 2024-03-08 DIAGNOSIS — R262 Difficulty in walking, not elsewhere classified: Secondary | ICD-10-CM | POA: Diagnosis not present

## 2024-03-08 DIAGNOSIS — H4052X3 Glaucoma secondary to other eye disorders, left eye, severe stage: Secondary | ICD-10-CM | POA: Diagnosis not present

## 2024-03-08 DIAGNOSIS — M21371 Foot drop, right foot: Secondary | ICD-10-CM | POA: Diagnosis not present

## 2024-03-09 DIAGNOSIS — M21371 Foot drop, right foot: Secondary | ICD-10-CM | POA: Diagnosis not present

## 2024-03-09 DIAGNOSIS — R2681 Unsteadiness on feet: Secondary | ICD-10-CM | POA: Diagnosis not present

## 2024-03-09 DIAGNOSIS — M6281 Muscle weakness (generalized): Secondary | ICD-10-CM | POA: Diagnosis not present

## 2024-03-09 DIAGNOSIS — H4052X3 Glaucoma secondary to other eye disorders, left eye, severe stage: Secondary | ICD-10-CM | POA: Diagnosis not present

## 2024-03-09 DIAGNOSIS — I5032 Chronic diastolic (congestive) heart failure: Secondary | ICD-10-CM | POA: Diagnosis not present

## 2024-03-09 DIAGNOSIS — R262 Difficulty in walking, not elsewhere classified: Secondary | ICD-10-CM | POA: Diagnosis not present

## 2024-03-09 DIAGNOSIS — M19042 Primary osteoarthritis, left hand: Secondary | ICD-10-CM | POA: Diagnosis not present

## 2024-03-09 DIAGNOSIS — R278 Other lack of coordination: Secondary | ICD-10-CM | POA: Diagnosis not present

## 2024-03-14 DIAGNOSIS — M21371 Foot drop, right foot: Secondary | ICD-10-CM | POA: Diagnosis not present

## 2024-03-14 DIAGNOSIS — M19042 Primary osteoarthritis, left hand: Secondary | ICD-10-CM | POA: Diagnosis not present

## 2024-03-14 DIAGNOSIS — H4052X3 Glaucoma secondary to other eye disorders, left eye, severe stage: Secondary | ICD-10-CM | POA: Diagnosis not present

## 2024-03-14 DIAGNOSIS — R2681 Unsteadiness on feet: Secondary | ICD-10-CM | POA: Diagnosis not present

## 2024-03-14 DIAGNOSIS — R262 Difficulty in walking, not elsewhere classified: Secondary | ICD-10-CM | POA: Diagnosis not present

## 2024-03-14 DIAGNOSIS — M6281 Muscle weakness (generalized): Secondary | ICD-10-CM | POA: Diagnosis not present

## 2024-03-14 DIAGNOSIS — R278 Other lack of coordination: Secondary | ICD-10-CM | POA: Diagnosis not present

## 2024-03-14 DIAGNOSIS — Z0000 Encounter for general adult medical examination without abnormal findings: Secondary | ICD-10-CM | POA: Diagnosis not present

## 2024-03-14 DIAGNOSIS — E1051 Type 1 diabetes mellitus with diabetic peripheral angiopathy without gangrene: Secondary | ICD-10-CM | POA: Diagnosis not present

## 2024-03-14 DIAGNOSIS — I5032 Chronic diastolic (congestive) heart failure: Secondary | ICD-10-CM | POA: Diagnosis not present

## 2024-03-15 DIAGNOSIS — R278 Other lack of coordination: Secondary | ICD-10-CM | POA: Diagnosis not present

## 2024-03-15 DIAGNOSIS — I5032 Chronic diastolic (congestive) heart failure: Secondary | ICD-10-CM | POA: Diagnosis not present

## 2024-03-15 DIAGNOSIS — R262 Difficulty in walking, not elsewhere classified: Secondary | ICD-10-CM | POA: Diagnosis not present

## 2024-03-15 DIAGNOSIS — R2681 Unsteadiness on feet: Secondary | ICD-10-CM | POA: Diagnosis not present

## 2024-03-15 DIAGNOSIS — M19042 Primary osteoarthritis, left hand: Secondary | ICD-10-CM | POA: Diagnosis not present

## 2024-03-15 DIAGNOSIS — M21371 Foot drop, right foot: Secondary | ICD-10-CM | POA: Diagnosis not present

## 2024-03-15 DIAGNOSIS — M6281 Muscle weakness (generalized): Secondary | ICD-10-CM | POA: Diagnosis not present

## 2024-03-15 DIAGNOSIS — H4052X3 Glaucoma secondary to other eye disorders, left eye, severe stage: Secondary | ICD-10-CM | POA: Diagnosis not present

## 2024-03-30 ENCOUNTER — Other Ambulatory Visit (INDEPENDENT_AMBULATORY_CARE_PROVIDER_SITE_OTHER): Payer: Self-pay

## 2024-03-30 ENCOUNTER — Encounter: Payer: Self-pay | Admitting: Orthopaedic Surgery

## 2024-03-30 ENCOUNTER — Ambulatory Visit: Admitting: Orthopaedic Surgery

## 2024-03-30 DIAGNOSIS — M25562 Pain in left knee: Secondary | ICD-10-CM

## 2024-03-30 DIAGNOSIS — G8929 Other chronic pain: Secondary | ICD-10-CM | POA: Diagnosis not present

## 2024-03-30 DIAGNOSIS — M25561 Pain in right knee: Secondary | ICD-10-CM

## 2024-03-30 DIAGNOSIS — M1712 Unilateral primary osteoarthritis, left knee: Secondary | ICD-10-CM

## 2024-03-30 NOTE — Progress Notes (Signed)
 The patient is only if sitting for a long period of time.  She is 73 years old and has a complicated history as a relates to her medical status and orthopedic status.  She has a remote history of a right total knee arthroplasty.  She had a hard mechanical fall last year and sustained a left ankle fracture dislocation in her right distal femur periprosthetic fracture.  Both of these were fixed by one of the trauma specialist in town.  She ended up needing revision on her left ankle surgery and now has an ankle fusion.  She is someone who mainly gets around in a lift chair.  She does not mobilize much at all.  She also has a history of congestive heart failure and A-fib.  She says she is not on blood thinning medications now and her primary care physician Dr. Adan Holms follows her closely.  She is not sure what her blood glucose has been doing recently and is not aware of her A1c.  She is also morbidly obese.  Her conditioning is significantly poor and she barely ambulates.  Both knees have flexion contractures.  I cannot fully extend either knee.  Her flexion on her left knee is also limited much past 90 degrees.  X-rays of the left knee show severe and stage arthritis with significant varus malalignment and osteopenic bone.  The right knee shows a well-seated right total knee arthroplasty with also a lateral plate from her distal femur fracture that is healed.  I am not sure that I would ever be comfortable proceeding with any type of knee replacement on her left knee given her comorbidities combined with her weight and the deformity of her knee with such osteopenic bone.  She would need definitely medical and cardiac clearance prior to any type of surgical intervention as well.  I have told find out what her hemoglobin A1c is which we can also check in the office.  It is certainly difficult to get a weight on her because she cannot get a lift chair.  With that being said we will see her back in 3 months and  if we can get a weight on her in the BMI calculation that would be prudent.  We can also check an A1c at that visit.

## 2024-04-05 ENCOUNTER — Telehealth: Payer: Self-pay | Admitting: Orthopaedic Surgery

## 2024-04-05 NOTE — Telephone Encounter (Signed)
 Carly from [facility] called to see if the patient can get an order for PT. CB#[phone]

## 2024-04-06 NOTE — Telephone Encounter (Signed)
 Verbal given

## 2024-04-10 DIAGNOSIS — R32 Unspecified urinary incontinence: Secondary | ICD-10-CM | POA: Diagnosis not present

## 2024-04-10 DIAGNOSIS — I10 Essential (primary) hypertension: Secondary | ICD-10-CM | POA: Diagnosis not present

## 2024-04-10 DIAGNOSIS — M1712 Unilateral primary osteoarthritis, left knee: Secondary | ICD-10-CM | POA: Diagnosis not present

## 2024-04-10 DIAGNOSIS — E785 Hyperlipidemia, unspecified: Secondary | ICD-10-CM | POA: Diagnosis not present

## 2024-04-10 DIAGNOSIS — M19042 Primary osteoarthritis, left hand: Secondary | ICD-10-CM | POA: Diagnosis not present

## 2024-04-10 DIAGNOSIS — M19041 Primary osteoarthritis, right hand: Secondary | ICD-10-CM | POA: Diagnosis not present

## 2024-04-10 DIAGNOSIS — F411 Generalized anxiety disorder: Secondary | ICD-10-CM | POA: Diagnosis not present

## 2024-04-10 DIAGNOSIS — I83892 Varicose veins of left lower extremities with other complications: Secondary | ICD-10-CM | POA: Diagnosis not present

## 2024-04-10 DIAGNOSIS — G8929 Other chronic pain: Secondary | ICD-10-CM | POA: Diagnosis not present

## 2024-04-10 DIAGNOSIS — Z96651 Presence of right artificial knee joint: Secondary | ICD-10-CM | POA: Diagnosis not present

## 2024-04-10 DIAGNOSIS — M8589 Other specified disorders of bone density and structure, multiple sites: Secondary | ICD-10-CM | POA: Diagnosis not present

## 2024-04-10 DIAGNOSIS — Z6839 Body mass index (BMI) 39.0-39.9, adult: Secondary | ICD-10-CM | POA: Diagnosis not present

## 2024-04-10 DIAGNOSIS — I4891 Unspecified atrial fibrillation: Secondary | ICD-10-CM | POA: Diagnosis not present

## 2024-04-10 DIAGNOSIS — E109 Type 1 diabetes mellitus without complications: Secondary | ICD-10-CM | POA: Diagnosis not present

## 2024-04-10 DIAGNOSIS — M19072 Primary osteoarthritis, left ankle and foot: Secondary | ICD-10-CM | POA: Diagnosis not present

## 2024-04-10 DIAGNOSIS — I5030 Unspecified diastolic (congestive) heart failure: Secondary | ICD-10-CM | POA: Diagnosis not present

## 2024-04-10 DIAGNOSIS — M19071 Primary osteoarthritis, right ankle and foot: Secondary | ICD-10-CM | POA: Diagnosis not present

## 2024-04-10 DIAGNOSIS — I5032 Chronic diastolic (congestive) heart failure: Secondary | ICD-10-CM | POA: Diagnosis not present

## 2024-04-13 DIAGNOSIS — E109 Type 1 diabetes mellitus without complications: Secondary | ICD-10-CM | POA: Diagnosis not present

## 2024-04-13 DIAGNOSIS — M19072 Primary osteoarthritis, left ankle and foot: Secondary | ICD-10-CM | POA: Diagnosis not present

## 2024-04-13 DIAGNOSIS — Z6839 Body mass index (BMI) 39.0-39.9, adult: Secondary | ICD-10-CM | POA: Diagnosis not present

## 2024-04-13 DIAGNOSIS — Z96651 Presence of right artificial knee joint: Secondary | ICD-10-CM | POA: Diagnosis not present

## 2024-04-13 DIAGNOSIS — M8589 Other specified disorders of bone density and structure, multiple sites: Secondary | ICD-10-CM | POA: Diagnosis not present

## 2024-04-13 DIAGNOSIS — M1712 Unilateral primary osteoarthritis, left knee: Secondary | ICD-10-CM | POA: Diagnosis not present

## 2024-04-13 DIAGNOSIS — E785 Hyperlipidemia, unspecified: Secondary | ICD-10-CM | POA: Diagnosis not present

## 2024-04-13 DIAGNOSIS — I4891 Unspecified atrial fibrillation: Secondary | ICD-10-CM | POA: Diagnosis not present

## 2024-04-13 DIAGNOSIS — M19042 Primary osteoarthritis, left hand: Secondary | ICD-10-CM | POA: Diagnosis not present

## 2024-04-13 DIAGNOSIS — M19041 Primary osteoarthritis, right hand: Secondary | ICD-10-CM | POA: Diagnosis not present

## 2024-04-13 DIAGNOSIS — F411 Generalized anxiety disorder: Secondary | ICD-10-CM | POA: Diagnosis not present

## 2024-04-13 DIAGNOSIS — M19071 Primary osteoarthritis, right ankle and foot: Secondary | ICD-10-CM | POA: Diagnosis not present

## 2024-04-13 DIAGNOSIS — I5032 Chronic diastolic (congestive) heart failure: Secondary | ICD-10-CM | POA: Diagnosis not present

## 2024-04-13 DIAGNOSIS — I83892 Varicose veins of left lower extremities with other complications: Secondary | ICD-10-CM | POA: Diagnosis not present

## 2024-04-13 DIAGNOSIS — G8929 Other chronic pain: Secondary | ICD-10-CM | POA: Diagnosis not present

## 2024-04-17 DIAGNOSIS — I83892 Varicose veins of left lower extremities with other complications: Secondary | ICD-10-CM | POA: Diagnosis not present

## 2024-04-17 DIAGNOSIS — Z6839 Body mass index (BMI) 39.0-39.9, adult: Secondary | ICD-10-CM | POA: Diagnosis not present

## 2024-04-17 DIAGNOSIS — F411 Generalized anxiety disorder: Secondary | ICD-10-CM | POA: Diagnosis not present

## 2024-04-17 DIAGNOSIS — E109 Type 1 diabetes mellitus without complications: Secondary | ICD-10-CM | POA: Diagnosis not present

## 2024-04-17 DIAGNOSIS — M1712 Unilateral primary osteoarthritis, left knee: Secondary | ICD-10-CM | POA: Diagnosis not present

## 2024-04-17 DIAGNOSIS — M19071 Primary osteoarthritis, right ankle and foot: Secondary | ICD-10-CM | POA: Diagnosis not present

## 2024-04-17 DIAGNOSIS — Z96651 Presence of right artificial knee joint: Secondary | ICD-10-CM | POA: Diagnosis not present

## 2024-04-17 DIAGNOSIS — E785 Hyperlipidemia, unspecified: Secondary | ICD-10-CM | POA: Diagnosis not present

## 2024-04-17 DIAGNOSIS — M8589 Other specified disorders of bone density and structure, multiple sites: Secondary | ICD-10-CM | POA: Diagnosis not present

## 2024-04-17 DIAGNOSIS — I4891 Unspecified atrial fibrillation: Secondary | ICD-10-CM | POA: Diagnosis not present

## 2024-04-17 DIAGNOSIS — M19041 Primary osteoarthritis, right hand: Secondary | ICD-10-CM | POA: Diagnosis not present

## 2024-04-17 DIAGNOSIS — G8929 Other chronic pain: Secondary | ICD-10-CM | POA: Diagnosis not present

## 2024-04-17 DIAGNOSIS — I5032 Chronic diastolic (congestive) heart failure: Secondary | ICD-10-CM | POA: Diagnosis not present

## 2024-04-17 DIAGNOSIS — M19042 Primary osteoarthritis, left hand: Secondary | ICD-10-CM | POA: Diagnosis not present

## 2024-04-17 DIAGNOSIS — M19072 Primary osteoarthritis, left ankle and foot: Secondary | ICD-10-CM | POA: Diagnosis not present

## 2024-04-20 DIAGNOSIS — E109 Type 1 diabetes mellitus without complications: Secondary | ICD-10-CM | POA: Diagnosis not present

## 2024-04-20 DIAGNOSIS — I4891 Unspecified atrial fibrillation: Secondary | ICD-10-CM | POA: Diagnosis not present

## 2024-04-20 DIAGNOSIS — I83892 Varicose veins of left lower extremities with other complications: Secondary | ICD-10-CM | POA: Diagnosis not present

## 2024-04-20 DIAGNOSIS — M19042 Primary osteoarthritis, left hand: Secondary | ICD-10-CM | POA: Diagnosis not present

## 2024-04-20 DIAGNOSIS — M8589 Other specified disorders of bone density and structure, multiple sites: Secondary | ICD-10-CM | POA: Diagnosis not present

## 2024-04-20 DIAGNOSIS — Z6839 Body mass index (BMI) 39.0-39.9, adult: Secondary | ICD-10-CM | POA: Diagnosis not present

## 2024-04-20 DIAGNOSIS — G8929 Other chronic pain: Secondary | ICD-10-CM | POA: Diagnosis not present

## 2024-04-20 DIAGNOSIS — M1712 Unilateral primary osteoarthritis, left knee: Secondary | ICD-10-CM | POA: Diagnosis not present

## 2024-04-20 DIAGNOSIS — Z96651 Presence of right artificial knee joint: Secondary | ICD-10-CM | POA: Diagnosis not present

## 2024-04-20 DIAGNOSIS — F411 Generalized anxiety disorder: Secondary | ICD-10-CM | POA: Diagnosis not present

## 2024-04-20 DIAGNOSIS — M19072 Primary osteoarthritis, left ankle and foot: Secondary | ICD-10-CM | POA: Diagnosis not present

## 2024-04-20 DIAGNOSIS — E785 Hyperlipidemia, unspecified: Secondary | ICD-10-CM | POA: Diagnosis not present

## 2024-04-20 DIAGNOSIS — M19041 Primary osteoarthritis, right hand: Secondary | ICD-10-CM | POA: Diagnosis not present

## 2024-04-20 DIAGNOSIS — M19071 Primary osteoarthritis, right ankle and foot: Secondary | ICD-10-CM | POA: Diagnosis not present

## 2024-04-20 DIAGNOSIS — I5032 Chronic diastolic (congestive) heart failure: Secondary | ICD-10-CM | POA: Diagnosis not present

## 2024-04-26 DIAGNOSIS — E109 Type 1 diabetes mellitus without complications: Secondary | ICD-10-CM | POA: Diagnosis not present

## 2024-04-26 DIAGNOSIS — F411 Generalized anxiety disorder: Secondary | ICD-10-CM | POA: Diagnosis not present

## 2024-04-26 DIAGNOSIS — I83892 Varicose veins of left lower extremities with other complications: Secondary | ICD-10-CM | POA: Diagnosis not present

## 2024-04-26 DIAGNOSIS — M1712 Unilateral primary osteoarthritis, left knee: Secondary | ICD-10-CM | POA: Diagnosis not present

## 2024-04-26 DIAGNOSIS — G8929 Other chronic pain: Secondary | ICD-10-CM | POA: Diagnosis not present

## 2024-04-26 DIAGNOSIS — M19042 Primary osteoarthritis, left hand: Secondary | ICD-10-CM | POA: Diagnosis not present

## 2024-04-26 DIAGNOSIS — M19072 Primary osteoarthritis, left ankle and foot: Secondary | ICD-10-CM | POA: Diagnosis not present

## 2024-04-26 DIAGNOSIS — Z6839 Body mass index (BMI) 39.0-39.9, adult: Secondary | ICD-10-CM | POA: Diagnosis not present

## 2024-04-26 DIAGNOSIS — I4891 Unspecified atrial fibrillation: Secondary | ICD-10-CM | POA: Diagnosis not present

## 2024-04-26 DIAGNOSIS — M19041 Primary osteoarthritis, right hand: Secondary | ICD-10-CM | POA: Diagnosis not present

## 2024-04-26 DIAGNOSIS — Z96651 Presence of right artificial knee joint: Secondary | ICD-10-CM | POA: Diagnosis not present

## 2024-04-26 DIAGNOSIS — M19071 Primary osteoarthritis, right ankle and foot: Secondary | ICD-10-CM | POA: Diagnosis not present

## 2024-04-26 DIAGNOSIS — M8589 Other specified disorders of bone density and structure, multiple sites: Secondary | ICD-10-CM | POA: Diagnosis not present

## 2024-04-26 DIAGNOSIS — E785 Hyperlipidemia, unspecified: Secondary | ICD-10-CM | POA: Diagnosis not present

## 2024-04-26 DIAGNOSIS — I5032 Chronic diastolic (congestive) heart failure: Secondary | ICD-10-CM | POA: Diagnosis not present

## 2024-04-28 DIAGNOSIS — I5032 Chronic diastolic (congestive) heart failure: Secondary | ICD-10-CM | POA: Diagnosis not present

## 2024-04-28 DIAGNOSIS — M19071 Primary osteoarthritis, right ankle and foot: Secondary | ICD-10-CM | POA: Diagnosis not present

## 2024-04-28 DIAGNOSIS — M19041 Primary osteoarthritis, right hand: Secondary | ICD-10-CM | POA: Diagnosis not present

## 2024-04-28 DIAGNOSIS — M8589 Other specified disorders of bone density and structure, multiple sites: Secondary | ICD-10-CM | POA: Diagnosis not present

## 2024-04-28 DIAGNOSIS — I4891 Unspecified atrial fibrillation: Secondary | ICD-10-CM | POA: Diagnosis not present

## 2024-04-28 DIAGNOSIS — I83892 Varicose veins of left lower extremities with other complications: Secondary | ICD-10-CM | POA: Diagnosis not present

## 2024-04-28 DIAGNOSIS — M19072 Primary osteoarthritis, left ankle and foot: Secondary | ICD-10-CM | POA: Diagnosis not present

## 2024-04-28 DIAGNOSIS — Z6839 Body mass index (BMI) 39.0-39.9, adult: Secondary | ICD-10-CM | POA: Diagnosis not present

## 2024-04-28 DIAGNOSIS — G8929 Other chronic pain: Secondary | ICD-10-CM | POA: Diagnosis not present

## 2024-04-28 DIAGNOSIS — E785 Hyperlipidemia, unspecified: Secondary | ICD-10-CM | POA: Diagnosis not present

## 2024-04-28 DIAGNOSIS — Z96651 Presence of right artificial knee joint: Secondary | ICD-10-CM | POA: Diagnosis not present

## 2024-04-28 DIAGNOSIS — M1712 Unilateral primary osteoarthritis, left knee: Secondary | ICD-10-CM | POA: Diagnosis not present

## 2024-04-28 DIAGNOSIS — M19042 Primary osteoarthritis, left hand: Secondary | ICD-10-CM | POA: Diagnosis not present

## 2024-04-28 DIAGNOSIS — E109 Type 1 diabetes mellitus without complications: Secondary | ICD-10-CM | POA: Diagnosis not present

## 2024-04-28 DIAGNOSIS — F411 Generalized anxiety disorder: Secondary | ICD-10-CM | POA: Diagnosis not present

## 2024-05-03 DIAGNOSIS — I83892 Varicose veins of left lower extremities with other complications: Secondary | ICD-10-CM | POA: Diagnosis not present

## 2024-05-03 DIAGNOSIS — I4891 Unspecified atrial fibrillation: Secondary | ICD-10-CM | POA: Diagnosis not present

## 2024-05-03 DIAGNOSIS — M19042 Primary osteoarthritis, left hand: Secondary | ICD-10-CM | POA: Diagnosis not present

## 2024-05-03 DIAGNOSIS — E785 Hyperlipidemia, unspecified: Secondary | ICD-10-CM | POA: Diagnosis not present

## 2024-05-03 DIAGNOSIS — M19041 Primary osteoarthritis, right hand: Secondary | ICD-10-CM | POA: Diagnosis not present

## 2024-05-03 DIAGNOSIS — G8929 Other chronic pain: Secondary | ICD-10-CM | POA: Diagnosis not present

## 2024-05-03 DIAGNOSIS — F411 Generalized anxiety disorder: Secondary | ICD-10-CM | POA: Diagnosis not present

## 2024-05-03 DIAGNOSIS — Z6839 Body mass index (BMI) 39.0-39.9, adult: Secondary | ICD-10-CM | POA: Diagnosis not present

## 2024-05-03 DIAGNOSIS — M8589 Other specified disorders of bone density and structure, multiple sites: Secondary | ICD-10-CM | POA: Diagnosis not present

## 2024-05-03 DIAGNOSIS — M19071 Primary osteoarthritis, right ankle and foot: Secondary | ICD-10-CM | POA: Diagnosis not present

## 2024-05-03 DIAGNOSIS — E109 Type 1 diabetes mellitus without complications: Secondary | ICD-10-CM | POA: Diagnosis not present

## 2024-05-03 DIAGNOSIS — Z96651 Presence of right artificial knee joint: Secondary | ICD-10-CM | POA: Diagnosis not present

## 2024-05-03 DIAGNOSIS — M1712 Unilateral primary osteoarthritis, left knee: Secondary | ICD-10-CM | POA: Diagnosis not present

## 2024-05-03 DIAGNOSIS — M19072 Primary osteoarthritis, left ankle and foot: Secondary | ICD-10-CM | POA: Diagnosis not present

## 2024-05-03 DIAGNOSIS — I5032 Chronic diastolic (congestive) heart failure: Secondary | ICD-10-CM | POA: Diagnosis not present

## 2024-05-05 DIAGNOSIS — E109 Type 1 diabetes mellitus without complications: Secondary | ICD-10-CM | POA: Diagnosis not present

## 2024-05-05 DIAGNOSIS — I5032 Chronic diastolic (congestive) heart failure: Secondary | ICD-10-CM | POA: Diagnosis not present

## 2024-05-05 DIAGNOSIS — M19071 Primary osteoarthritis, right ankle and foot: Secondary | ICD-10-CM | POA: Diagnosis not present

## 2024-05-05 DIAGNOSIS — Z96651 Presence of right artificial knee joint: Secondary | ICD-10-CM | POA: Diagnosis not present

## 2024-05-05 DIAGNOSIS — M19072 Primary osteoarthritis, left ankle and foot: Secondary | ICD-10-CM | POA: Diagnosis not present

## 2024-05-05 DIAGNOSIS — M19042 Primary osteoarthritis, left hand: Secondary | ICD-10-CM | POA: Diagnosis not present

## 2024-05-05 DIAGNOSIS — M1712 Unilateral primary osteoarthritis, left knee: Secondary | ICD-10-CM | POA: Diagnosis not present

## 2024-05-05 DIAGNOSIS — E785 Hyperlipidemia, unspecified: Secondary | ICD-10-CM | POA: Diagnosis not present

## 2024-05-05 DIAGNOSIS — I4891 Unspecified atrial fibrillation: Secondary | ICD-10-CM | POA: Diagnosis not present

## 2024-05-05 DIAGNOSIS — I83892 Varicose veins of left lower extremities with other complications: Secondary | ICD-10-CM | POA: Diagnosis not present

## 2024-05-05 DIAGNOSIS — F411 Generalized anxiety disorder: Secondary | ICD-10-CM | POA: Diagnosis not present

## 2024-05-05 DIAGNOSIS — Z6839 Body mass index (BMI) 39.0-39.9, adult: Secondary | ICD-10-CM | POA: Diagnosis not present

## 2024-05-05 DIAGNOSIS — M8589 Other specified disorders of bone density and structure, multiple sites: Secondary | ICD-10-CM | POA: Diagnosis not present

## 2024-05-05 DIAGNOSIS — M19041 Primary osteoarthritis, right hand: Secondary | ICD-10-CM | POA: Diagnosis not present

## 2024-05-05 DIAGNOSIS — G8929 Other chronic pain: Secondary | ICD-10-CM | POA: Diagnosis not present

## 2024-05-10 DIAGNOSIS — M19042 Primary osteoarthritis, left hand: Secondary | ICD-10-CM | POA: Diagnosis not present

## 2024-05-10 DIAGNOSIS — M19071 Primary osteoarthritis, right ankle and foot: Secondary | ICD-10-CM | POA: Diagnosis not present

## 2024-05-10 DIAGNOSIS — I5032 Chronic diastolic (congestive) heart failure: Secondary | ICD-10-CM | POA: Diagnosis not present

## 2024-05-10 DIAGNOSIS — M8589 Other specified disorders of bone density and structure, multiple sites: Secondary | ICD-10-CM | POA: Diagnosis not present

## 2024-05-10 DIAGNOSIS — M19072 Primary osteoarthritis, left ankle and foot: Secondary | ICD-10-CM | POA: Diagnosis not present

## 2024-05-10 DIAGNOSIS — E109 Type 1 diabetes mellitus without complications: Secondary | ICD-10-CM | POA: Diagnosis not present

## 2024-05-10 DIAGNOSIS — M19041 Primary osteoarthritis, right hand: Secondary | ICD-10-CM | POA: Diagnosis not present

## 2024-05-10 DIAGNOSIS — M1712 Unilateral primary osteoarthritis, left knee: Secondary | ICD-10-CM | POA: Diagnosis not present

## 2024-05-10 DIAGNOSIS — G8929 Other chronic pain: Secondary | ICD-10-CM | POA: Diagnosis not present

## 2024-05-10 DIAGNOSIS — Z6839 Body mass index (BMI) 39.0-39.9, adult: Secondary | ICD-10-CM | POA: Diagnosis not present

## 2024-05-10 DIAGNOSIS — E785 Hyperlipidemia, unspecified: Secondary | ICD-10-CM | POA: Diagnosis not present

## 2024-05-10 DIAGNOSIS — F411 Generalized anxiety disorder: Secondary | ICD-10-CM | POA: Diagnosis not present

## 2024-05-10 DIAGNOSIS — I83892 Varicose veins of left lower extremities with other complications: Secondary | ICD-10-CM | POA: Diagnosis not present

## 2024-05-10 DIAGNOSIS — I4891 Unspecified atrial fibrillation: Secondary | ICD-10-CM | POA: Diagnosis not present

## 2024-05-10 DIAGNOSIS — Z96651 Presence of right artificial knee joint: Secondary | ICD-10-CM | POA: Diagnosis not present

## 2024-05-16 DIAGNOSIS — Z6839 Body mass index (BMI) 39.0-39.9, adult: Secondary | ICD-10-CM | POA: Diagnosis not present

## 2024-05-16 DIAGNOSIS — I5032 Chronic diastolic (congestive) heart failure: Secondary | ICD-10-CM | POA: Diagnosis not present

## 2024-05-16 DIAGNOSIS — M19042 Primary osteoarthritis, left hand: Secondary | ICD-10-CM | POA: Diagnosis not present

## 2024-05-16 DIAGNOSIS — F411 Generalized anxiety disorder: Secondary | ICD-10-CM | POA: Diagnosis not present

## 2024-05-16 DIAGNOSIS — M8589 Other specified disorders of bone density and structure, multiple sites: Secondary | ICD-10-CM | POA: Diagnosis not present

## 2024-05-16 DIAGNOSIS — E109 Type 1 diabetes mellitus without complications: Secondary | ICD-10-CM | POA: Diagnosis not present

## 2024-05-16 DIAGNOSIS — M19071 Primary osteoarthritis, right ankle and foot: Secondary | ICD-10-CM | POA: Diagnosis not present

## 2024-05-16 DIAGNOSIS — M1712 Unilateral primary osteoarthritis, left knee: Secondary | ICD-10-CM | POA: Diagnosis not present

## 2024-05-16 DIAGNOSIS — I4891 Unspecified atrial fibrillation: Secondary | ICD-10-CM | POA: Diagnosis not present

## 2024-05-16 DIAGNOSIS — E785 Hyperlipidemia, unspecified: Secondary | ICD-10-CM | POA: Diagnosis not present

## 2024-05-16 DIAGNOSIS — Z96651 Presence of right artificial knee joint: Secondary | ICD-10-CM | POA: Diagnosis not present

## 2024-05-16 DIAGNOSIS — G8929 Other chronic pain: Secondary | ICD-10-CM | POA: Diagnosis not present

## 2024-05-16 DIAGNOSIS — M19041 Primary osteoarthritis, right hand: Secondary | ICD-10-CM | POA: Diagnosis not present

## 2024-05-16 DIAGNOSIS — I83892 Varicose veins of left lower extremities with other complications: Secondary | ICD-10-CM | POA: Diagnosis not present

## 2024-05-16 DIAGNOSIS — M19072 Primary osteoarthritis, left ankle and foot: Secondary | ICD-10-CM | POA: Diagnosis not present

## 2024-05-23 DIAGNOSIS — I5032 Chronic diastolic (congestive) heart failure: Secondary | ICD-10-CM | POA: Diagnosis not present

## 2024-05-23 DIAGNOSIS — M19042 Primary osteoarthritis, left hand: Secondary | ICD-10-CM | POA: Diagnosis not present

## 2024-05-23 DIAGNOSIS — E109 Type 1 diabetes mellitus without complications: Secondary | ICD-10-CM | POA: Diagnosis not present

## 2024-05-23 DIAGNOSIS — M19072 Primary osteoarthritis, left ankle and foot: Secondary | ICD-10-CM | POA: Diagnosis not present

## 2024-05-23 DIAGNOSIS — M8589 Other specified disorders of bone density and structure, multiple sites: Secondary | ICD-10-CM | POA: Diagnosis not present

## 2024-05-23 DIAGNOSIS — M19071 Primary osteoarthritis, right ankle and foot: Secondary | ICD-10-CM | POA: Diagnosis not present

## 2024-05-23 DIAGNOSIS — G8929 Other chronic pain: Secondary | ICD-10-CM | POA: Diagnosis not present

## 2024-05-23 DIAGNOSIS — M19041 Primary osteoarthritis, right hand: Secondary | ICD-10-CM | POA: Diagnosis not present

## 2024-05-23 DIAGNOSIS — I83892 Varicose veins of left lower extremities with other complications: Secondary | ICD-10-CM | POA: Diagnosis not present

## 2024-05-23 DIAGNOSIS — E785 Hyperlipidemia, unspecified: Secondary | ICD-10-CM | POA: Diagnosis not present

## 2024-05-23 DIAGNOSIS — Z6839 Body mass index (BMI) 39.0-39.9, adult: Secondary | ICD-10-CM | POA: Diagnosis not present

## 2024-05-23 DIAGNOSIS — I4891 Unspecified atrial fibrillation: Secondary | ICD-10-CM | POA: Diagnosis not present

## 2024-05-23 DIAGNOSIS — Z96651 Presence of right artificial knee joint: Secondary | ICD-10-CM | POA: Diagnosis not present

## 2024-05-23 DIAGNOSIS — F411 Generalized anxiety disorder: Secondary | ICD-10-CM | POA: Diagnosis not present

## 2024-05-23 DIAGNOSIS — M1712 Unilateral primary osteoarthritis, left knee: Secondary | ICD-10-CM | POA: Diagnosis not present

## 2024-06-01 DIAGNOSIS — R300 Dysuria: Secondary | ICD-10-CM | POA: Diagnosis not present

## 2024-06-01 DIAGNOSIS — N390 Urinary tract infection, site not specified: Secondary | ICD-10-CM | POA: Diagnosis not present

## 2024-06-02 DIAGNOSIS — E785 Hyperlipidemia, unspecified: Secondary | ICD-10-CM | POA: Diagnosis not present

## 2024-06-02 DIAGNOSIS — M19042 Primary osteoarthritis, left hand: Secondary | ICD-10-CM | POA: Diagnosis not present

## 2024-06-02 DIAGNOSIS — M19071 Primary osteoarthritis, right ankle and foot: Secondary | ICD-10-CM | POA: Diagnosis not present

## 2024-06-02 DIAGNOSIS — M19072 Primary osteoarthritis, left ankle and foot: Secondary | ICD-10-CM | POA: Diagnosis not present

## 2024-06-02 DIAGNOSIS — E109 Type 1 diabetes mellitus without complications: Secondary | ICD-10-CM | POA: Diagnosis not present

## 2024-06-02 DIAGNOSIS — F411 Generalized anxiety disorder: Secondary | ICD-10-CM | POA: Diagnosis not present

## 2024-06-02 DIAGNOSIS — M1712 Unilateral primary osteoarthritis, left knee: Secondary | ICD-10-CM | POA: Diagnosis not present

## 2024-06-02 DIAGNOSIS — Z6839 Body mass index (BMI) 39.0-39.9, adult: Secondary | ICD-10-CM | POA: Diagnosis not present

## 2024-06-02 DIAGNOSIS — M19041 Primary osteoarthritis, right hand: Secondary | ICD-10-CM | POA: Diagnosis not present

## 2024-06-02 DIAGNOSIS — I83892 Varicose veins of left lower extremities with other complications: Secondary | ICD-10-CM | POA: Diagnosis not present

## 2024-06-02 DIAGNOSIS — M8589 Other specified disorders of bone density and structure, multiple sites: Secondary | ICD-10-CM | POA: Diagnosis not present

## 2024-06-02 DIAGNOSIS — Z96651 Presence of right artificial knee joint: Secondary | ICD-10-CM | POA: Diagnosis not present

## 2024-06-02 DIAGNOSIS — G8929 Other chronic pain: Secondary | ICD-10-CM | POA: Diagnosis not present

## 2024-06-02 DIAGNOSIS — I5032 Chronic diastolic (congestive) heart failure: Secondary | ICD-10-CM | POA: Diagnosis not present

## 2024-06-02 DIAGNOSIS — I4891 Unspecified atrial fibrillation: Secondary | ICD-10-CM | POA: Diagnosis not present

## 2024-06-06 DIAGNOSIS — G8929 Other chronic pain: Secondary | ICD-10-CM | POA: Diagnosis not present

## 2024-06-06 DIAGNOSIS — M8589 Other specified disorders of bone density and structure, multiple sites: Secondary | ICD-10-CM | POA: Diagnosis not present

## 2024-06-06 DIAGNOSIS — E109 Type 1 diabetes mellitus without complications: Secondary | ICD-10-CM | POA: Diagnosis not present

## 2024-06-06 DIAGNOSIS — M19042 Primary osteoarthritis, left hand: Secondary | ICD-10-CM | POA: Diagnosis not present

## 2024-06-06 DIAGNOSIS — F411 Generalized anxiety disorder: Secondary | ICD-10-CM | POA: Diagnosis not present

## 2024-06-06 DIAGNOSIS — I83892 Varicose veins of left lower extremities with other complications: Secondary | ICD-10-CM | POA: Diagnosis not present

## 2024-06-06 DIAGNOSIS — I4891 Unspecified atrial fibrillation: Secondary | ICD-10-CM | POA: Diagnosis not present

## 2024-06-06 DIAGNOSIS — Z96651 Presence of right artificial knee joint: Secondary | ICD-10-CM | POA: Diagnosis not present

## 2024-06-06 DIAGNOSIS — M1712 Unilateral primary osteoarthritis, left knee: Secondary | ICD-10-CM | POA: Diagnosis not present

## 2024-06-06 DIAGNOSIS — M19072 Primary osteoarthritis, left ankle and foot: Secondary | ICD-10-CM | POA: Diagnosis not present

## 2024-06-06 DIAGNOSIS — M19071 Primary osteoarthritis, right ankle and foot: Secondary | ICD-10-CM | POA: Diagnosis not present

## 2024-06-06 DIAGNOSIS — E785 Hyperlipidemia, unspecified: Secondary | ICD-10-CM | POA: Diagnosis not present

## 2024-06-06 DIAGNOSIS — Z6839 Body mass index (BMI) 39.0-39.9, adult: Secondary | ICD-10-CM | POA: Diagnosis not present

## 2024-06-06 DIAGNOSIS — I5032 Chronic diastolic (congestive) heart failure: Secondary | ICD-10-CM | POA: Diagnosis not present

## 2024-06-06 DIAGNOSIS — M19041 Primary osteoarthritis, right hand: Secondary | ICD-10-CM | POA: Diagnosis not present

## 2024-06-14 DIAGNOSIS — I83892 Varicose veins of left lower extremities with other complications: Secondary | ICD-10-CM | POA: Diagnosis not present

## 2024-06-14 DIAGNOSIS — M1712 Unilateral primary osteoarthritis, left knee: Secondary | ICD-10-CM | POA: Diagnosis not present

## 2024-06-14 DIAGNOSIS — M19042 Primary osteoarthritis, left hand: Secondary | ICD-10-CM | POA: Diagnosis not present

## 2024-06-14 DIAGNOSIS — Z96651 Presence of right artificial knee joint: Secondary | ICD-10-CM | POA: Diagnosis not present

## 2024-06-14 DIAGNOSIS — F411 Generalized anxiety disorder: Secondary | ICD-10-CM | POA: Diagnosis not present

## 2024-06-14 DIAGNOSIS — E785 Hyperlipidemia, unspecified: Secondary | ICD-10-CM | POA: Diagnosis not present

## 2024-06-14 DIAGNOSIS — M19072 Primary osteoarthritis, left ankle and foot: Secondary | ICD-10-CM | POA: Diagnosis not present

## 2024-06-14 DIAGNOSIS — Z6839 Body mass index (BMI) 39.0-39.9, adult: Secondary | ICD-10-CM | POA: Diagnosis not present

## 2024-06-14 DIAGNOSIS — M19041 Primary osteoarthritis, right hand: Secondary | ICD-10-CM | POA: Diagnosis not present

## 2024-06-14 DIAGNOSIS — Z794 Long term (current) use of insulin: Secondary | ICD-10-CM | POA: Diagnosis not present

## 2024-06-14 DIAGNOSIS — M8589 Other specified disorders of bone density and structure, multiple sites: Secondary | ICD-10-CM | POA: Diagnosis not present

## 2024-06-14 DIAGNOSIS — I4891 Unspecified atrial fibrillation: Secondary | ICD-10-CM | POA: Diagnosis not present

## 2024-06-14 DIAGNOSIS — M19071 Primary osteoarthritis, right ankle and foot: Secondary | ICD-10-CM | POA: Diagnosis not present

## 2024-06-14 DIAGNOSIS — G8929 Other chronic pain: Secondary | ICD-10-CM | POA: Diagnosis not present

## 2024-06-14 DIAGNOSIS — I5032 Chronic diastolic (congestive) heart failure: Secondary | ICD-10-CM | POA: Diagnosis not present

## 2024-06-14 DIAGNOSIS — E109 Type 1 diabetes mellitus without complications: Secondary | ICD-10-CM | POA: Diagnosis not present

## 2024-06-16 DIAGNOSIS — N390 Urinary tract infection, site not specified: Secondary | ICD-10-CM | POA: Diagnosis not present

## 2024-06-16 DIAGNOSIS — R82998 Other abnormal findings in urine: Secondary | ICD-10-CM | POA: Diagnosis not present

## 2024-06-17 DIAGNOSIS — Z6839 Body mass index (BMI) 39.0-39.9, adult: Secondary | ICD-10-CM | POA: Diagnosis not present

## 2024-06-17 DIAGNOSIS — I5032 Chronic diastolic (congestive) heart failure: Secondary | ICD-10-CM | POA: Diagnosis not present

## 2024-06-17 DIAGNOSIS — M1712 Unilateral primary osteoarthritis, left knee: Secondary | ICD-10-CM | POA: Diagnosis not present

## 2024-06-17 DIAGNOSIS — G8929 Other chronic pain: Secondary | ICD-10-CM | POA: Diagnosis not present

## 2024-06-17 DIAGNOSIS — M19072 Primary osteoarthritis, left ankle and foot: Secondary | ICD-10-CM | POA: Diagnosis not present

## 2024-06-17 DIAGNOSIS — M8589 Other specified disorders of bone density and structure, multiple sites: Secondary | ICD-10-CM | POA: Diagnosis not present

## 2024-06-17 DIAGNOSIS — E109 Type 1 diabetes mellitus without complications: Secondary | ICD-10-CM | POA: Diagnosis not present

## 2024-06-17 DIAGNOSIS — I4891 Unspecified atrial fibrillation: Secondary | ICD-10-CM | POA: Diagnosis not present

## 2024-06-17 DIAGNOSIS — M19041 Primary osteoarthritis, right hand: Secondary | ICD-10-CM | POA: Diagnosis not present

## 2024-06-17 DIAGNOSIS — E785 Hyperlipidemia, unspecified: Secondary | ICD-10-CM | POA: Diagnosis not present

## 2024-06-17 DIAGNOSIS — I83892 Varicose veins of left lower extremities with other complications: Secondary | ICD-10-CM | POA: Diagnosis not present

## 2024-06-17 DIAGNOSIS — M19042 Primary osteoarthritis, left hand: Secondary | ICD-10-CM | POA: Diagnosis not present

## 2024-06-17 DIAGNOSIS — Z96651 Presence of right artificial knee joint: Secondary | ICD-10-CM | POA: Diagnosis not present

## 2024-06-17 DIAGNOSIS — M19071 Primary osteoarthritis, right ankle and foot: Secondary | ICD-10-CM | POA: Diagnosis not present

## 2024-06-17 DIAGNOSIS — Z794 Long term (current) use of insulin: Secondary | ICD-10-CM | POA: Diagnosis not present

## 2024-06-17 DIAGNOSIS — F411 Generalized anxiety disorder: Secondary | ICD-10-CM | POA: Diagnosis not present

## 2024-06-21 DIAGNOSIS — M19041 Primary osteoarthritis, right hand: Secondary | ICD-10-CM | POA: Diagnosis not present

## 2024-06-21 DIAGNOSIS — M8589 Other specified disorders of bone density and structure, multiple sites: Secondary | ICD-10-CM | POA: Diagnosis not present

## 2024-06-21 DIAGNOSIS — M19072 Primary osteoarthritis, left ankle and foot: Secondary | ICD-10-CM | POA: Diagnosis not present

## 2024-06-21 DIAGNOSIS — Z794 Long term (current) use of insulin: Secondary | ICD-10-CM | POA: Diagnosis not present

## 2024-06-21 DIAGNOSIS — M1712 Unilateral primary osteoarthritis, left knee: Secondary | ICD-10-CM | POA: Diagnosis not present

## 2024-06-21 DIAGNOSIS — I5032 Chronic diastolic (congestive) heart failure: Secondary | ICD-10-CM | POA: Diagnosis not present

## 2024-06-21 DIAGNOSIS — G8929 Other chronic pain: Secondary | ICD-10-CM | POA: Diagnosis not present

## 2024-06-21 DIAGNOSIS — F411 Generalized anxiety disorder: Secondary | ICD-10-CM | POA: Diagnosis not present

## 2024-06-21 DIAGNOSIS — M19042 Primary osteoarthritis, left hand: Secondary | ICD-10-CM | POA: Diagnosis not present

## 2024-06-21 DIAGNOSIS — Z6839 Body mass index (BMI) 39.0-39.9, adult: Secondary | ICD-10-CM | POA: Diagnosis not present

## 2024-06-21 DIAGNOSIS — E109 Type 1 diabetes mellitus without complications: Secondary | ICD-10-CM | POA: Diagnosis not present

## 2024-06-21 DIAGNOSIS — I4891 Unspecified atrial fibrillation: Secondary | ICD-10-CM | POA: Diagnosis not present

## 2024-06-21 DIAGNOSIS — Z96651 Presence of right artificial knee joint: Secondary | ICD-10-CM | POA: Diagnosis not present

## 2024-06-21 DIAGNOSIS — E785 Hyperlipidemia, unspecified: Secondary | ICD-10-CM | POA: Diagnosis not present

## 2024-06-21 DIAGNOSIS — M19071 Primary osteoarthritis, right ankle and foot: Secondary | ICD-10-CM | POA: Diagnosis not present

## 2024-06-21 DIAGNOSIS — I83892 Varicose veins of left lower extremities with other complications: Secondary | ICD-10-CM | POA: Diagnosis not present

## 2024-06-23 DIAGNOSIS — M8589 Other specified disorders of bone density and structure, multiple sites: Secondary | ICD-10-CM | POA: Diagnosis not present

## 2024-06-23 DIAGNOSIS — Z96651 Presence of right artificial knee joint: Secondary | ICD-10-CM | POA: Diagnosis not present

## 2024-06-23 DIAGNOSIS — Z794 Long term (current) use of insulin: Secondary | ICD-10-CM | POA: Diagnosis not present

## 2024-06-23 DIAGNOSIS — F411 Generalized anxiety disorder: Secondary | ICD-10-CM | POA: Diagnosis not present

## 2024-06-23 DIAGNOSIS — M1712 Unilateral primary osteoarthritis, left knee: Secondary | ICD-10-CM | POA: Diagnosis not present

## 2024-06-23 DIAGNOSIS — I83892 Varicose veins of left lower extremities with other complications: Secondary | ICD-10-CM | POA: Diagnosis not present

## 2024-06-23 DIAGNOSIS — M19071 Primary osteoarthritis, right ankle and foot: Secondary | ICD-10-CM | POA: Diagnosis not present

## 2024-06-23 DIAGNOSIS — I5032 Chronic diastolic (congestive) heart failure: Secondary | ICD-10-CM | POA: Diagnosis not present

## 2024-06-23 DIAGNOSIS — E785 Hyperlipidemia, unspecified: Secondary | ICD-10-CM | POA: Diagnosis not present

## 2024-06-23 DIAGNOSIS — Z6839 Body mass index (BMI) 39.0-39.9, adult: Secondary | ICD-10-CM | POA: Diagnosis not present

## 2024-06-23 DIAGNOSIS — I4891 Unspecified atrial fibrillation: Secondary | ICD-10-CM | POA: Diagnosis not present

## 2024-06-23 DIAGNOSIS — M19041 Primary osteoarthritis, right hand: Secondary | ICD-10-CM | POA: Diagnosis not present

## 2024-06-23 DIAGNOSIS — E109 Type 1 diabetes mellitus without complications: Secondary | ICD-10-CM | POA: Diagnosis not present

## 2024-06-23 DIAGNOSIS — G8929 Other chronic pain: Secondary | ICD-10-CM | POA: Diagnosis not present

## 2024-06-23 DIAGNOSIS — M19072 Primary osteoarthritis, left ankle and foot: Secondary | ICD-10-CM | POA: Diagnosis not present

## 2024-06-23 DIAGNOSIS — M19042 Primary osteoarthritis, left hand: Secondary | ICD-10-CM | POA: Diagnosis not present

## 2024-06-27 DIAGNOSIS — I4891 Unspecified atrial fibrillation: Secondary | ICD-10-CM | POA: Diagnosis not present

## 2024-06-27 DIAGNOSIS — M19042 Primary osteoarthritis, left hand: Secondary | ICD-10-CM | POA: Diagnosis not present

## 2024-06-27 DIAGNOSIS — I83892 Varicose veins of left lower extremities with other complications: Secondary | ICD-10-CM | POA: Diagnosis not present

## 2024-06-27 DIAGNOSIS — M19071 Primary osteoarthritis, right ankle and foot: Secondary | ICD-10-CM | POA: Diagnosis not present

## 2024-06-27 DIAGNOSIS — G8929 Other chronic pain: Secondary | ICD-10-CM | POA: Diagnosis not present

## 2024-06-27 DIAGNOSIS — Z794 Long term (current) use of insulin: Secondary | ICD-10-CM | POA: Diagnosis not present

## 2024-06-27 DIAGNOSIS — M19072 Primary osteoarthritis, left ankle and foot: Secondary | ICD-10-CM | POA: Diagnosis not present

## 2024-06-27 DIAGNOSIS — F411 Generalized anxiety disorder: Secondary | ICD-10-CM | POA: Diagnosis not present

## 2024-06-27 DIAGNOSIS — Z96651 Presence of right artificial knee joint: Secondary | ICD-10-CM | POA: Diagnosis not present

## 2024-06-27 DIAGNOSIS — E109 Type 1 diabetes mellitus without complications: Secondary | ICD-10-CM | POA: Diagnosis not present

## 2024-06-27 DIAGNOSIS — Z6839 Body mass index (BMI) 39.0-39.9, adult: Secondary | ICD-10-CM | POA: Diagnosis not present

## 2024-06-27 DIAGNOSIS — I5032 Chronic diastolic (congestive) heart failure: Secondary | ICD-10-CM | POA: Diagnosis not present

## 2024-06-27 DIAGNOSIS — M19041 Primary osteoarthritis, right hand: Secondary | ICD-10-CM | POA: Diagnosis not present

## 2024-06-27 DIAGNOSIS — M1712 Unilateral primary osteoarthritis, left knee: Secondary | ICD-10-CM | POA: Diagnosis not present

## 2024-06-27 DIAGNOSIS — M8589 Other specified disorders of bone density and structure, multiple sites: Secondary | ICD-10-CM | POA: Diagnosis not present

## 2024-06-27 DIAGNOSIS — E785 Hyperlipidemia, unspecified: Secondary | ICD-10-CM | POA: Diagnosis not present

## 2024-06-29 DIAGNOSIS — M19072 Primary osteoarthritis, left ankle and foot: Secondary | ICD-10-CM | POA: Diagnosis not present

## 2024-06-29 DIAGNOSIS — M1712 Unilateral primary osteoarthritis, left knee: Secondary | ICD-10-CM | POA: Diagnosis not present

## 2024-06-29 DIAGNOSIS — F411 Generalized anxiety disorder: Secondary | ICD-10-CM | POA: Diagnosis not present

## 2024-06-29 DIAGNOSIS — I83892 Varicose veins of left lower extremities with other complications: Secondary | ICD-10-CM | POA: Diagnosis not present

## 2024-06-29 DIAGNOSIS — I4891 Unspecified atrial fibrillation: Secondary | ICD-10-CM | POA: Diagnosis not present

## 2024-06-29 DIAGNOSIS — M19041 Primary osteoarthritis, right hand: Secondary | ICD-10-CM | POA: Diagnosis not present

## 2024-06-29 DIAGNOSIS — E785 Hyperlipidemia, unspecified: Secondary | ICD-10-CM | POA: Diagnosis not present

## 2024-06-29 DIAGNOSIS — Z6839 Body mass index (BMI) 39.0-39.9, adult: Secondary | ICD-10-CM | POA: Diagnosis not present

## 2024-06-29 DIAGNOSIS — M8589 Other specified disorders of bone density and structure, multiple sites: Secondary | ICD-10-CM | POA: Diagnosis not present

## 2024-06-29 DIAGNOSIS — E109 Type 1 diabetes mellitus without complications: Secondary | ICD-10-CM | POA: Diagnosis not present

## 2024-06-29 DIAGNOSIS — M19071 Primary osteoarthritis, right ankle and foot: Secondary | ICD-10-CM | POA: Diagnosis not present

## 2024-06-29 DIAGNOSIS — Z794 Long term (current) use of insulin: Secondary | ICD-10-CM | POA: Diagnosis not present

## 2024-06-29 DIAGNOSIS — Z96651 Presence of right artificial knee joint: Secondary | ICD-10-CM | POA: Diagnosis not present

## 2024-06-29 DIAGNOSIS — M19042 Primary osteoarthritis, left hand: Secondary | ICD-10-CM | POA: Diagnosis not present

## 2024-06-29 DIAGNOSIS — I5032 Chronic diastolic (congestive) heart failure: Secondary | ICD-10-CM | POA: Diagnosis not present

## 2024-06-29 DIAGNOSIS — G8929 Other chronic pain: Secondary | ICD-10-CM | POA: Diagnosis not present

## 2024-07-03 ENCOUNTER — Ambulatory Visit (INDEPENDENT_AMBULATORY_CARE_PROVIDER_SITE_OTHER)

## 2024-07-03 ENCOUNTER — Ambulatory Visit (INDEPENDENT_AMBULATORY_CARE_PROVIDER_SITE_OTHER): Admitting: Podiatry

## 2024-07-03 ENCOUNTER — Encounter: Payer: Self-pay | Admitting: Podiatry

## 2024-07-03 VITALS — Ht 67.0 in | Wt 187.4 lb

## 2024-07-03 DIAGNOSIS — M79674 Pain in right toe(s): Secondary | ICD-10-CM

## 2024-07-03 DIAGNOSIS — B351 Tinea unguium: Secondary | ICD-10-CM | POA: Diagnosis not present

## 2024-07-03 DIAGNOSIS — M79675 Pain in left toe(s): Secondary | ICD-10-CM

## 2024-07-03 DIAGNOSIS — M7751 Other enthesopathy of right foot: Secondary | ICD-10-CM
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
## 2024-07-04 DIAGNOSIS — Z6839 Body mass index (BMI) 39.0-39.9, adult: Secondary | ICD-10-CM | POA: Diagnosis not present

## 2024-07-04 DIAGNOSIS — M1712 Unilateral primary osteoarthritis, left knee: Secondary | ICD-10-CM | POA: Diagnosis not present

## 2024-07-04 DIAGNOSIS — G8929 Other chronic pain: Secondary | ICD-10-CM | POA: Diagnosis not present

## 2024-07-04 DIAGNOSIS — Z794 Long term (current) use of insulin: Secondary | ICD-10-CM | POA: Diagnosis not present

## 2024-07-04 DIAGNOSIS — E109 Type 1 diabetes mellitus without complications: Secondary | ICD-10-CM | POA: Diagnosis not present

## 2024-07-04 DIAGNOSIS — M19041 Primary osteoarthritis, right hand: Secondary | ICD-10-CM | POA: Diagnosis not present

## 2024-07-04 DIAGNOSIS — M19042 Primary osteoarthritis, left hand: Secondary | ICD-10-CM | POA: Diagnosis not present

## 2024-07-04 DIAGNOSIS — I4891 Unspecified atrial fibrillation: Secondary | ICD-10-CM | POA: Diagnosis not present

## 2024-07-04 DIAGNOSIS — M8589 Other specified disorders of bone density and structure, multiple sites: Secondary | ICD-10-CM | POA: Diagnosis not present

## 2024-07-04 DIAGNOSIS — M19071 Primary osteoarthritis, right ankle and foot: Secondary | ICD-10-CM | POA: Diagnosis not present

## 2024-07-04 DIAGNOSIS — F411 Generalized anxiety disorder: Secondary | ICD-10-CM | POA: Diagnosis not present

## 2024-07-04 DIAGNOSIS — Z96651 Presence of right artificial knee joint: Secondary | ICD-10-CM | POA: Diagnosis not present

## 2024-07-04 DIAGNOSIS — E785 Hyperlipidemia, unspecified: Secondary | ICD-10-CM | POA: Diagnosis not present

## 2024-07-04 DIAGNOSIS — M19072 Primary osteoarthritis, left ankle and foot: Secondary | ICD-10-CM | POA: Diagnosis not present

## 2024-07-04 DIAGNOSIS — I5032 Chronic diastolic (congestive) heart failure: Secondary | ICD-10-CM | POA: Diagnosis not present

## 2024-07-04 DIAGNOSIS — I83892 Varicose veins of left lower extremities with other complications: Secondary | ICD-10-CM | POA: Diagnosis not present

## 2024-07-11 DIAGNOSIS — R8271 Bacteriuria: Secondary | ICD-10-CM | POA: Diagnosis not present

## 2024-07-11 DIAGNOSIS — N3021 Other chronic cystitis with hematuria: Secondary | ICD-10-CM | POA: Diagnosis not present

## 2024-07-11 DIAGNOSIS — N200 Calculus of kidney: Secondary | ICD-10-CM | POA: Diagnosis not present

## 2024-07-11 DIAGNOSIS — R3915 Urgency of urination: Secondary | ICD-10-CM | POA: Diagnosis not present

## 2024-07-13 DIAGNOSIS — M19072 Primary osteoarthritis, left ankle and foot: Secondary | ICD-10-CM | POA: Diagnosis not present

## 2024-07-13 DIAGNOSIS — I5032 Chronic diastolic (congestive) heart failure: Secondary | ICD-10-CM | POA: Diagnosis not present

## 2024-07-13 DIAGNOSIS — M1712 Unilateral primary osteoarthritis, left knee: Secondary | ICD-10-CM | POA: Diagnosis not present

## 2024-07-13 DIAGNOSIS — Z794 Long term (current) use of insulin: Secondary | ICD-10-CM | POA: Diagnosis not present

## 2024-07-13 DIAGNOSIS — Z96651 Presence of right artificial knee joint: Secondary | ICD-10-CM | POA: Diagnosis not present

## 2024-07-13 DIAGNOSIS — Z6839 Body mass index (BMI) 39.0-39.9, adult: Secondary | ICD-10-CM | POA: Diagnosis not present

## 2024-07-13 DIAGNOSIS — M19041 Primary osteoarthritis, right hand: Secondary | ICD-10-CM | POA: Diagnosis not present

## 2024-07-13 DIAGNOSIS — F411 Generalized anxiety disorder: Secondary | ICD-10-CM | POA: Diagnosis not present

## 2024-07-13 DIAGNOSIS — E785 Hyperlipidemia, unspecified: Secondary | ICD-10-CM | POA: Diagnosis not present

## 2024-07-13 DIAGNOSIS — M19042 Primary osteoarthritis, left hand: Secondary | ICD-10-CM | POA: Diagnosis not present

## 2024-07-13 DIAGNOSIS — M8589 Other specified disorders of bone density and structure, multiple sites: Secondary | ICD-10-CM | POA: Diagnosis not present

## 2024-07-13 DIAGNOSIS — I83892 Varicose veins of left lower extremities with other complications: Secondary | ICD-10-CM | POA: Diagnosis not present

## 2024-07-13 DIAGNOSIS — M19071 Primary osteoarthritis, right ankle and foot: Secondary | ICD-10-CM | POA: Diagnosis not present

## 2024-07-13 DIAGNOSIS — E109 Type 1 diabetes mellitus without complications: Secondary | ICD-10-CM | POA: Diagnosis not present

## 2024-07-13 DIAGNOSIS — G8929 Other chronic pain: Secondary | ICD-10-CM | POA: Diagnosis not present

## 2024-07-13 DIAGNOSIS — I4891 Unspecified atrial fibrillation: Secondary | ICD-10-CM | POA: Diagnosis not present

## 2024-07-19 DIAGNOSIS — I83892 Varicose veins of left lower extremities with other complications: Secondary | ICD-10-CM | POA: Diagnosis not present

## 2024-07-19 DIAGNOSIS — M19071 Primary osteoarthritis, right ankle and foot: Secondary | ICD-10-CM | POA: Diagnosis not present

## 2024-07-19 DIAGNOSIS — M19042 Primary osteoarthritis, left hand: Secondary | ICD-10-CM | POA: Diagnosis not present

## 2024-07-19 DIAGNOSIS — F411 Generalized anxiety disorder: Secondary | ICD-10-CM | POA: Diagnosis not present

## 2024-07-19 DIAGNOSIS — M19072 Primary osteoarthritis, left ankle and foot: Secondary | ICD-10-CM | POA: Diagnosis not present

## 2024-07-19 DIAGNOSIS — E109 Type 1 diabetes mellitus without complications: Secondary | ICD-10-CM | POA: Diagnosis not present

## 2024-07-19 DIAGNOSIS — Z6839 Body mass index (BMI) 39.0-39.9, adult: Secondary | ICD-10-CM | POA: Diagnosis not present

## 2024-07-19 DIAGNOSIS — Z794 Long term (current) use of insulin: Secondary | ICD-10-CM | POA: Diagnosis not present

## 2024-07-19 DIAGNOSIS — M1712 Unilateral primary osteoarthritis, left knee: Secondary | ICD-10-CM | POA: Diagnosis not present

## 2024-07-19 DIAGNOSIS — E785 Hyperlipidemia, unspecified: Secondary | ICD-10-CM | POA: Diagnosis not present

## 2024-07-19 DIAGNOSIS — Z96651 Presence of right artificial knee joint: Secondary | ICD-10-CM | POA: Diagnosis not present

## 2024-07-19 DIAGNOSIS — I4891 Unspecified atrial fibrillation: Secondary | ICD-10-CM | POA: Diagnosis not present

## 2024-07-19 DIAGNOSIS — M19041 Primary osteoarthritis, right hand: Secondary | ICD-10-CM | POA: Diagnosis not present

## 2024-07-19 DIAGNOSIS — I5032 Chronic diastolic (congestive) heart failure: Secondary | ICD-10-CM | POA: Diagnosis not present

## 2024-07-19 DIAGNOSIS — G8929 Other chronic pain: Secondary | ICD-10-CM | POA: Diagnosis not present

## 2024-07-19 DIAGNOSIS — M8589 Other specified disorders of bone density and structure, multiple sites: Secondary | ICD-10-CM | POA: Diagnosis not present

## 2024-07-24 DIAGNOSIS — E109 Type 1 diabetes mellitus without complications: Secondary | ICD-10-CM | POA: Diagnosis not present

## 2024-07-24 DIAGNOSIS — M19072 Primary osteoarthritis, left ankle and foot: Secondary | ICD-10-CM | POA: Diagnosis not present

## 2024-07-24 DIAGNOSIS — E785 Hyperlipidemia, unspecified: Secondary | ICD-10-CM | POA: Diagnosis not present

## 2024-07-24 DIAGNOSIS — I4891 Unspecified atrial fibrillation: Secondary | ICD-10-CM | POA: Diagnosis not present

## 2024-07-24 DIAGNOSIS — Z96651 Presence of right artificial knee joint: Secondary | ICD-10-CM | POA: Diagnosis not present

## 2024-07-24 DIAGNOSIS — I83892 Varicose veins of left lower extremities with other complications: Secondary | ICD-10-CM | POA: Diagnosis not present

## 2024-07-24 DIAGNOSIS — F411 Generalized anxiety disorder: Secondary | ICD-10-CM | POA: Diagnosis not present

## 2024-07-24 DIAGNOSIS — G8929 Other chronic pain: Secondary | ICD-10-CM | POA: Diagnosis not present

## 2024-07-24 DIAGNOSIS — Z794 Long term (current) use of insulin: Secondary | ICD-10-CM | POA: Diagnosis not present

## 2024-07-24 DIAGNOSIS — Z6839 Body mass index (BMI) 39.0-39.9, adult: Secondary | ICD-10-CM | POA: Diagnosis not present

## 2024-07-24 DIAGNOSIS — I5032 Chronic diastolic (congestive) heart failure: Secondary | ICD-10-CM | POA: Diagnosis not present

## 2024-07-24 DIAGNOSIS — M1712 Unilateral primary osteoarthritis, left knee: Secondary | ICD-10-CM | POA: Diagnosis not present

## 2024-07-24 DIAGNOSIS — M19071 Primary osteoarthritis, right ankle and foot: Secondary | ICD-10-CM | POA: Diagnosis not present

## 2024-07-24 DIAGNOSIS — M8589 Other specified disorders of bone density and structure, multiple sites: Secondary | ICD-10-CM | POA: Diagnosis not present

## 2024-07-24 DIAGNOSIS — M19042 Primary osteoarthritis, left hand: Secondary | ICD-10-CM | POA: Diagnosis not present

## 2024-07-24 DIAGNOSIS — M19041 Primary osteoarthritis, right hand: Secondary | ICD-10-CM | POA: Diagnosis not present

## 2024-07-25 ENCOUNTER — Other Ambulatory Visit: Payer: Self-pay | Admitting: Urology

## 2024-07-26 ENCOUNTER — Encounter (HOSPITAL_COMMUNITY): Payer: Self-pay | Admitting: Urology

## 2024-07-26 NOTE — Progress Notes (Signed)
 Spoke w/ via phone for pre-op interview--- [name] needs dos----  CBG, BMP, A1C and EKG per anesthesia. Orders need second sign as of 07/26/24.       Clements results------ COVID test -----patient states asymptomatic no test needed Arrive at -------0930 NPO after MN NO Solid Food.  Clear liquids from MN until---0830 Pre-Surgery Ensure or G2:  Med rec completed Medications to take morning of surgery -----Metoprolol , Duloxetine  and Diltiazem . Diabetic medication ----- No insulin  AM of surgery. 1/2 bedtime Lantus  insulin  dose.  GLP1 agonist last dose: GLP1 instructions:  Patient instructed no nail polish to be worn day of surgery Patient instructed to bring photo id and insurance card day of surgery Patient aware to have Driver (ride ) / caregiver    for 24 hours after surgery - Transport service will bring, [name] and [name] will be with patient. Patient Special Instructions ----- Bring extra Dexcom supplies day of surgery. Pre-Op special Instructions ----- patient is in electric wheelchair but states she can stand and pivot to stretcher.  Patient verbalized understanding of instructions that were given at this phone interview. Patient denies chest pain, sob, fever, cough at the interview.

## 2024-08-01 ENCOUNTER — Other Ambulatory Visit (HOSPITAL_COMMUNITY): Payer: Self-pay

## 2024-08-01 ENCOUNTER — Encounter (HOSPITAL_COMMUNITY): Payer: Self-pay | Admitting: Urology

## 2024-08-01 ENCOUNTER — Encounter (HOSPITAL_COMMUNITY)
Admission: RE | Disposition: A | Discharge status: Home / Self Care | Payer: Self-pay | Source: Home / Self Care | Attending: Urology

## 2024-08-01 ENCOUNTER — Other Ambulatory Visit: Payer: Self-pay

## 2024-08-01 ENCOUNTER — Ambulatory Visit (HOSPITAL_COMMUNITY)

## 2024-08-01 ENCOUNTER — Ambulatory Visit (HOSPITAL_COMMUNITY): Admitting: Anesthesiology

## 2024-08-01 ENCOUNTER — Ambulatory Visit (HOSPITAL_COMMUNITY)
Admission: RE | Admit: 2024-08-01 | Discharge: 2024-08-01 | Disposition: A | Discharge status: Home / Self Care | Attending: Urology | Admitting: Urology

## 2024-08-01 DIAGNOSIS — I509 Heart failure, unspecified: Secondary | ICD-10-CM | POA: Diagnosis not present

## 2024-08-01 DIAGNOSIS — I4891 Unspecified atrial fibrillation: Secondary | ICD-10-CM | POA: Diagnosis not present

## 2024-08-01 DIAGNOSIS — R935 Abnormal findings on diagnostic imaging of other abdominal regions, including retroperitoneum: Secondary | ICD-10-CM | POA: Diagnosis not present

## 2024-08-01 DIAGNOSIS — I110 Hypertensive heart disease with heart failure: Secondary | ICD-10-CM | POA: Insufficient documentation

## 2024-08-01 DIAGNOSIS — Z87440 Personal history of urinary (tract) infections: Secondary | ICD-10-CM | POA: Diagnosis not present

## 2024-08-01 DIAGNOSIS — E109 Type 1 diabetes mellitus without complications: Secondary | ICD-10-CM

## 2024-08-01 DIAGNOSIS — Z794 Long term (current) use of insulin: Secondary | ICD-10-CM | POA: Insufficient documentation

## 2024-08-01 DIAGNOSIS — N200 Calculus of kidney: Secondary | ICD-10-CM | POA: Diagnosis not present

## 2024-08-01 DIAGNOSIS — I5032 Chronic diastolic (congestive) heart failure: Secondary | ICD-10-CM

## 2024-08-01 DIAGNOSIS — Z87442 Personal history of urinary calculi: Secondary | ICD-10-CM | POA: Diagnosis not present

## 2024-08-01 DIAGNOSIS — N1330 Unspecified hydronephrosis: Secondary | ICD-10-CM | POA: Diagnosis not present

## 2024-08-01 DIAGNOSIS — I10 Essential (primary) hypertension: Secondary | ICD-10-CM

## 2024-08-01 DIAGNOSIS — E119 Type 2 diabetes mellitus without complications: Secondary | ICD-10-CM | POA: Insufficient documentation

## 2024-08-01 DIAGNOSIS — R310 Gross hematuria: Secondary | ICD-10-CM | POA: Diagnosis not present

## 2024-08-01 DIAGNOSIS — K219 Gastro-esophageal reflux disease without esophagitis: Secondary | ICD-10-CM | POA: Diagnosis not present

## 2024-08-01 DIAGNOSIS — N3021 Other chronic cystitis with hematuria: Secondary | ICD-10-CM | POA: Insufficient documentation

## 2024-08-01 HISTORY — DX: Other specified postprocedural states: Z98.890

## 2024-08-01 LAB — BASIC METABOLIC PANEL WITH GFR
Anion gap: 11 (ref 5–15)
BUN: 17 mg/dL (ref 8–23)
CO2: 27 mmol/L (ref 22–32)
Calcium: 9.4 mg/dL (ref 8.9–10.3)
Chloride: 100 mmol/L (ref 98–111)
Creatinine, Ser: 0.57 mg/dL (ref 0.44–1.00)
GFR, Estimated: >60 mL/min (ref >60)
Glucose, Bld: 165 mg/dL — ABNORMAL HIGH (ref 70–99)
Potassium: 4.2 mmol/L (ref 3.5–5.1)
Sodium: 138 mmol/L (ref 135–145)

## 2024-08-01 LAB — HEMOGLOBIN A1C
Hgb A1c MFr Bld: 8.6 % — ABNORMAL HIGH (ref 4.8–5.6)
Mean Plasma Glucose: 200.12 mg/dL

## 2024-08-01 LAB — GLUCOSE, CAPILLARY
Glucose-Capillary: 156 mg/dL — ABNORMAL HIGH (ref 70–99)
Glucose-Capillary: 136 mg/dL — ABNORMAL HIGH (ref 70–99)

## 2024-08-01 SURGERY — CYSTOSCOPY/URETEROSCOPY/HOLMIUM LASER/STENT PLACEMENT
Anesthesia: General

## 2024-08-01 MED ORDER — ORAL CARE MOUTH RINSE: 15.0000 mL | Freq: Once | OROMUCOSAL | Status: AC

## 2024-08-01 MED ORDER — ONDANSETRON HCL 4 MG/2ML IJ SOLN: 4.0000 mg | Freq: Once | INTRAMUSCULAR | Status: DC | PRN

## 2024-08-01 MED ORDER — SODIUM CHLORIDE 0.9 % IR SOLN
Status: DC | PRN
  Administered 2024-08-01: 3000 mL via INTRAVESICAL

## 2024-08-01 MED ORDER — FENTANYL CITRATE (PF) 100 MCG/2ML IJ SOLN: 25.0000 ug | INTRAMUSCULAR | Status: DC | PRN

## 2024-08-01 MED ORDER — ACETAMINOPHEN 500 MG PO TABS
1000.0000 mg | ORAL_TABLET | Freq: Once | ORAL | Status: AC
  Administered 2024-08-01: 1000 mg via ORAL

## 2024-08-01 MED ORDER — PHENYLEPHRINE 80 MCG/ML (10ML) SYRINGE FOR IV PUSH (FOR BLOOD PRESSURE SUPPORT)
PREFILLED_SYRINGE | INTRAVENOUS | Status: DC | PRN
  Administered 2024-08-01: 160 ug via INTRAVENOUS

## 2024-08-01 MED ORDER — LACTATED RINGERS IV SOLN
INTRAVENOUS | Status: DC
Start: 2024-08-01 — End: 2024-08-01

## 2024-08-01 MED ORDER — 0.9 % SODIUM CHLORIDE (POUR BTL) OPTIME
TOPICAL | Status: DC | PRN
Start: 2024-08-01 — End: 2024-08-01
  Administered 2024-08-01: 1000 mL

## 2024-08-01 MED ORDER — LIDOCAINE 2% (20 MG/ML) 5 ML SYRINGE
INTRAMUSCULAR | Status: AC
  Filled 2024-08-01: qty 5

## 2024-08-01 MED ORDER — ONDANSETRON HCL 4 MG/2ML IJ SOLN
INTRAMUSCULAR | Status: DC | PRN
  Administered 2024-08-01: 4 mg via INTRAVENOUS

## 2024-08-01 MED ORDER — OXYCODONE HCL 5 MG/5ML PO SOLN: 5.0000 mg | Freq: Once | ORAL | Status: DC | PRN

## 2024-08-01 MED ORDER — LIDOCAINE 2% (20 MG/ML) 5 ML SYRINGE
INTRAMUSCULAR | Status: DC | PRN
  Administered 2024-08-01: 60 mg via INTRAVENOUS

## 2024-08-01 MED ORDER — PROPOFOL 10 MG/ML IV BOLUS
INTRAVENOUS | Status: AC
  Filled 2024-08-01: qty 20

## 2024-08-01 MED ORDER — FENTANYL CITRATE (PF) 250 MCG/5ML IJ SOLN
INTRAMUSCULAR | Status: DC | PRN
  Administered 2024-08-01: 50 ug via INTRAVENOUS

## 2024-08-01 MED ORDER — INSULIN ASPART 100 UNIT/ML IJ SOLN: 0.0000 [IU] | INTRAMUSCULAR | Status: DC | PRN

## 2024-08-01 MED ORDER — ACETAMINOPHEN 500 MG PO TABS
ORAL_TABLET | ORAL | Status: AC
  Filled 2024-08-01: qty 2

## 2024-08-01 MED ORDER — FENTANYL CITRATE (PF) 250 MCG/5ML IJ SOLN
INTRAMUSCULAR | Status: AC
  Filled 2024-08-01: qty 5

## 2024-08-01 MED ORDER — CEFAZOLIN SODIUM-DEXTROSE 2-4 GM/100ML-% IV SOLN
INTRAVENOUS | Status: AC
  Filled 2024-08-01: qty 100

## 2024-08-01 MED ORDER — OXYCODONE HCL 5 MG PO TABS: 5.0000 mg | ORAL_TABLET | Freq: Once | ORAL | Status: DC | PRN

## 2024-08-01 MED ORDER — CEFAZOLIN SODIUM-DEXTROSE 2-4 GM/100ML-% IV SOLN
2.0000 g | INTRAVENOUS | Status: AC
Start: 2024-08-01 — End: 2024-08-01
  Administered 2024-08-01: 2 g via INTRAVENOUS

## 2024-08-01 MED ORDER — PROPOFOL 10 MG/ML IV BOLUS
INTRAVENOUS | Status: DC | PRN
  Administered 2024-08-01: 40 mg via INTRAVENOUS
  Administered 2024-08-01: 150 mg via INTRAVENOUS

## 2024-08-01 MED ORDER — CEPHALEXIN 500 MG PO CAPS
500.0000 mg | ORAL_CAPSULE | Freq: Every day | ORAL | 0 refills | Status: AC
  Filled 2024-08-01: qty 28, 28d supply, fill #0

## 2024-08-01 MED ORDER — IOHEXOL 300 MG/ML  SOLN
INTRAMUSCULAR | Status: DC | PRN
  Administered 2024-08-01: 20 mL

## 2024-08-01 MED ORDER — CHLORHEXIDINE GLUCONATE 0.12 % MT SOLN
OROMUCOSAL | Status: AC
  Filled 2024-08-01: qty 15

## 2024-08-01 MED ORDER — CHLORHEXIDINE GLUCONATE 0.12 % MT SOLN
15.0000 mL | Freq: Once | OROMUCOSAL | Status: AC
  Administered 2024-08-01: 15 mL via OROMUCOSAL

## 2024-08-01 SURGICAL SUPPLY — 31 items
BAG DRAIN URO-CYSTO SKYTR STRL (DRAIN) ×2 IMPLANT
BAG URINE DRAIN 2000ML AR STRL (UROLOGICAL SUPPLIES) IMPLANT
BASKET ZERO TIP NITINOL 2.4FR (BASKET) IMPLANT
CATH FOLEY 2WAY SLVR 5CC 18FR (CATHETERS) IMPLANT
CATH URETL OPEN 5X70 (CATHETERS) ×2 IMPLANT
CATH URETL OPEN END 6FR 70 (CATHETERS) IMPLANT
Contour 6x24 IMPLANT
Contour 6x26 IMPLANT
DRSG TEGADERM 4X4.75 (GAUZE/BANDAGES/DRESSINGS) IMPLANT
ELECTRODE REM PT RTRN 9FT ADLT (ELECTROSURGICAL) ×2 IMPLANT
EXTRACTOR STONE 1.7FRX115CM (UROLOGICAL SUPPLIES) IMPLANT
GLOVE BIO SURGEON STRL SZ 6.5 (GLOVE) ×2 IMPLANT
GOWN STRL REUS W/ TWL LRG LVL3 (GOWN DISPOSABLE) ×2 IMPLANT
GOWN STRL REUS W/TWL LRG LVL3 (GOWN DISPOSABLE) ×2 IMPLANT
GUIDEWIRE STR DUAL SENSOR (WIRE) ×2 IMPLANT
HOLDER FOLEY CATH W/STRAP (MISCELLANEOUS) IMPLANT
KIT TURNOVER KIT B (KITS) ×2 IMPLANT
LOOP CUT BIPOLAR 24F LRG (ELECTROSURGICAL) IMPLANT
MANIFOLD NEPTUNE II (INSTRUMENTS) ×2 IMPLANT
NS IRRIG 1000ML POUR BTL (IV SOLUTION) IMPLANT
PACK CYSTO (CUSTOM PROCEDURE TRAY) ×2 IMPLANT
SHEATH NAVIGATOR HD 11/13X28 (SHEATH) IMPLANT
SHEATH NAVIGATOR HD 11/13X36 (SHEATH) IMPLANT
SHEATH NAVIGATOR HD 12/14X46 (SHEATH) IMPLANT
SLEEVE SCD COMPRESS KNEE MED (STOCKING) ×2 IMPLANT
SOL .9 NS 3000ML IRR UROMATIC (IV SOLUTION) ×2 IMPLANT
STENT URET 6FRX24 CONTOUR (STENTS) IMPLANT
STENT URET 6FRX26 CONTOUR (STENTS) IMPLANT
SYRINGE TOOMEY IRRIG 70ML (MISCELLANEOUS) ×2 IMPLANT
TUBE CONNECTING 12X1/4 (SUCTIONS) ×2 IMPLANT
TUBING UROLOGY SET (TUBING) ×2 IMPLANT

## 2024-08-01 NOTE — Discharge Instructions (Addendum)
 Post stent placement instructions   Definitions:  Ureter: The duct that transports urine from the kidney to the bladder. Stent: A plastic hollow tube that is placed into the ureter, from the kidney to the bladder to prevent the ureter from swelling shut.  General instructions:  Despite the fact that no skin incisions were used, the area around the ureter and bladder is raw and irritated. The stent is a foreign body which can further irritate the bladder wall. This irritation is manifested by increased frequency of urination, both day and night, and by an increase in the urge to urinate. In some, the urge to urinate is present almost always. Sometimes the urge is strong enough that you may not be able to stop your self from urinating. This can often be controlled with medication but does not occur in everyone. A stent can safely be left in place for 3 months or greater.  You may see some blood in your urine while the stent is in place and a few days afterward. Do not be alarmed, even if the urine is clear for a while. Get off your feet and drink lots of fluids until clearing occurs. If you start to pass clots or don't improve, call us .  Diet:  You may return to your normal diet immediately. Because of the raw surface of your bladder, alcohol, spicy foods, foods high in acid and drinks with caffeine may cause irritation or frequency and should be used in moderation. To keep your urine flowing freely and avoid constipation, drink plenty of fluids during the day (8-10 glasses). Tip: Avoid cranberry juice because it is very acidic.  Activity:  Your physical activity doesn't need to be restricted. However, if you are very active, you may see some blood in the urine. We suggest that you reduce your activity under the circumstances until the bleeding has stopped.  Bowels:  It is important to keep your bowels regular during the postoperative period. Straining with bowel movements can cause bleeding. A  bowel movement every other day is reasonable. Use a mild laxative if needed, such as milk of magnesia 2-3 tablespoons, or 2 Dulcolax tablets. Call if you continue to have problems. If you had been taking narcotics for pain, before, during or after your surgery, you may be constipated. Take a laxative if necessary.  Medication:  You should resume your pre-surgery medications unless told not to. In addition you may be given an antibiotic to prevent or treat infection. Antibiotics are not always necessary. All medication should be taken as prescribed until the bottles are finished unless you are having an unusual reaction to one of the drugs.  Problems you should report to us :  a. Fever greater than 101F. b. Heavy bleeding, or clots (see notes above about blood in urine). c. Inability to urinate. d. Drug reactions (hives, rash, nausea, vomiting, diarrhea). e. Severe burning or pain with urination that is not improving.  Follow up You will need a second procedure to be scheduled in 2-3 weeks.   Post Anesthesia Home Care Instructions  Activity: Get plenty of rest for the remainder of the day. A responsible individual must stay with you for 24 hours following the procedure.  For the next 24 hours, DO NOT: -Drive a car -Advertising copywriter -Drink alcoholic beverages -Take any medication unless instructed by your physician -Make any legal decisions or sign important papers.  Meals: Start with liquid foods such as gelatin or soup. Progress to regular foods as tolerated. Avoid greasy,  spicy, heavy foods. If nausea and/or vomiting occur, drink only clear liquids until the nausea and/or vomiting subsides. Call your physician if vomiting continues.  Special Instructions/Symptoms: Your throat may feel dry or sore from the anesthesia or the breathing tube placed in your throat during surgery. If this causes discomfort, gargle with warm salt water. The discomfort should disappear within 24  hours.

## 2024-08-01 NOTE — Op Note (Addendum)
 Preoperative diagnosis:  Gross hematuria Right renal calculus    Postoperative diagnosis:  Gross hematuria Right renal calculus   Procedure: Cystoscopy right ureteral stent placement 6Fr x 24 cm no string bilateral retrograde pyelography with interpretation   Surgeon: Valli Shank, MD  Anesthesia: General  Complications: None  Intraoperative findings:   Normal urethra Bilateral orthotopic UOS Right retrograde pyelogram with filling defect in right renal pelvis consistent with stone left retrograde pyelography demonstrated normal caliber ureter without filling defect 5. Bladder mucosa with evidence of cystitis cystica consistent with chronic infection  EBL: Minimal  Specimens: None  Indication: [name] is a 72 y.o. patient with gross hematuria and flank pain. After reviewing the management options for treatment, he elected to proceed with the above surgical procedure(s). We have discussed the potential benefits and risks of the procedure, side effects of the proposed treatment, the likelihood of the patient achieving the goals of the procedure, and any potential problems that might occur during the procedure or recuperation. Informed consent has been obtained.  Description of procedure:  The patient was taken to the operating room and general anesthesia was induced.  The patient was placed in the dorsal lithotomy position, prepped and draped in the usual sterile fashion, and preoperative antibiotics were administered. A preoperative time-out was performed.   Cystourethroscopy was performed.  The patient's urethra was examined and was normal. Bladder mucosa showed signs of chronic infection.  Attention then turned to the left ureteral orifice and a ureteral catheter was used to intubate the ureteral orifice.  Omnipaque  contrast was injected through the ureteral catheter and a retrograde pyelogram was performed with findings as dictated above.  Attention then turned to the  right ureteral orifice.  The open-ended catheter was used to cannulate  the UO and a retrograde pyelograms obtained.  A 0.38 sensor guidewire was then advanced up the left ureter into the renal pelvis under fluoroscopic guidance.  The wire was then backloaded through the cystoscope and a ureteral stent was advance over the wire using Seldinger technique.  The stent was positioned appropriately under fluoroscopic and cystoscopic guidance.  The wire was then removed with an adequate stent curl noted in the renal pelvis as well as in the bladder.  The bladder was then emptied and the procedure ended.  The patient appeared to tolerate the procedure well and without complications.  The patient was able to be awakened and transferred to the recovery unit in satisfactory condition.   Plan: Patient was scheduled for staged procedure and started on suppressive antibiotics in the interim.  Valli Shank, M.D.

## 2024-08-01 NOTE — Anesthesia Postprocedure Evaluation (Signed)
 Anesthesia Post Note  Patient: [name]  Procedure(s) Performed: DIAGNOSTIC CYSTOSCOPY/BILATERAL RETROGRADE/RIGHT STENT PLACEMENT     Patient location during evaluation: PACU Anesthesia Type: General Level of consciousness: awake and alert Pain management: pain level controlled Vital Signs Assessment: post-procedure vital signs reviewed and stable Respiratory status: spontaneous breathing, nonlabored ventilation and respiratory function stable Cardiovascular status: stable and blood pressure returned to baseline Anesthetic complications: no   No notable events documented.  Last Vitals:  Vitals:   08/01/24 1245 08/01/24 1300  BP: (!) 159/89 (!) 164/88  Pulse: 81 82  Resp: 17 13  Temp: 36.4 C   SpO2: 93% 97%    Last Pain:  Vitals:   08/01/24 1036  TempSrc: Oral  PainSc: 0-No pain                 Debby FORBES Like

## 2024-08-01 NOTE — Anesthesia Preprocedure Evaluation (Addendum)
 Anesthesia Evaluation  Patient identified by MRN, date of birth, ID band Patient awake    Reviewed: Allergy & Precautions, NPO status , Patient's Chart, lab work & pertinent test results, reviewed documented beta blocker date and time   History of Anesthesia Complications (+) PONV and history of anesthetic complications  Airway Mallampati: II  TM Distance: >3 FB Neck ROM: Full    Dental  (+) Dental Advisory Given   Pulmonary neg pulmonary ROS   Pulmonary exam normal        Cardiovascular hypertension, Pt. on medications and Pt. on home beta blockers Normal cardiovascular exam+ dysrhythmias Atrial Fibrillation    '24 TTE - EF 65 to 70%. Right ventricular systolic function is mildly reduced. The right ventricular size is mildly enlarged. Left atrial size was mildly dilated. Right atrial size was mildly dilated. Trivial mitral valve regurgitation. Mild mitral stenosis. The mean mitral valve gradient is 4.3 mmHg with average heart rate of 96 bpm. Aortic valve regurgitation is trivial.     Neuro/Psych  PSYCHIATRIC DISORDERS Anxiety      Hx SAH Right eye blindness     GI/Hepatic Neg liver ROS,GERD  Controlled,,  Endo/Other  diabetes, Insulin  Dependent    Renal/GU     Chronic cystitis     Musculoskeletal  (+) Arthritis ,    Abdominal   Peds  Hematology negative hematology ROS (+)   Anesthesia Other Findings   Reproductive/Obstetrics                              Anesthesia Physical Anesthesia Plan  ASA: 3  Anesthesia Plan: General   Post-op Pain Management: Tylenol  PO (pre-op)*   Induction: Intravenous  PONV Risk Score and Plan: 4 or greater and Treatment may vary due to age or medical condition, Ondansetron  and TIVA  Airway Management Planned: LMA  Additional Equipment: None  Intra-op Plan:   Post-operative Plan: Extubation in OR  Informed Consent: I have reviewed the  patients History and Physical, chart, labs and discussed the procedure including the risks, benefits and alternatives for the proposed anesthesia with the patient or authorized representative who has indicated his/her understanding and acceptance.     Dental advisory given  Plan Discussed with: CRNA and Anesthesiologist  Anesthesia Plan Comments:          Anesthesia Quick Evaluation

## 2024-08-01 NOTE — Transfer of Care (Signed)
 Immediate Anesthesia Transfer of Care Note  Patient: [name]  Procedure(s) Performed: DIAGNOSTIC CYSTOSCOPY/BILATERAL RETROGRADE/RIGHT STENT PLACEMENT  Patient Location: PACU  Anesthesia Type:General  Level of Consciousness: drowsy  Airway & Oxygen  Therapy: Patient Spontanous Breathing and Patient connected to face mask oxygen   Post-op Assessment: Report given to RN and Post -op Vital signs reviewed and stable  Post vital signs: Reviewed and stable  Last Vitals:  Vitals Value Taken Time  BP 137/73 08/01/24 11:52  Temp 36.4 C 08/01/24 11:52  Pulse    Resp 12 08/01/24 11:57  SpO2 100 % 08/01/24 11:52  Vitals shown include unfiled device data.  Last Pain:  Vitals:   08/01/24 1036  TempSrc: Oral  PainSc: 0-No pain      Patients Stated Pain Goal: 6 (08/01/24 1036)  Complications: No notable events documented.

## 2024-08-01 NOTE — H&P (Signed)
 CC/HPI: cc: microhematuria, UTIs   02/06/22: 73 year old woman referred for microscopic hematuria. UA today shows 3-10 RBCs per high-powered field. Patient denies any gross hematuria. She used to get frequent UTIs however has not had many recently. She also has urinary urgency and urge incontinence. As soon as she has the urge to go to the bathroom she has complete loss of urine. She tried medication many years ago but it was not affordable. She is unsure what this medication was.   04/17/2022: 73 year old woman here for cystoscopy for microscopic hematuria evaluation. CT scan showed a 1 cm right renal pelvis stone. In addition patient had symptoms of urinary urgency and frequency and was given samples of Myrbetriq  at her last visit. In addition patient was given samples of Myrbetriq  for urgency and frequency at her last visit. She does think this is helping.   09/18/2022: 73 year old woman who underwent an evaluation for microscopic hematuria here for follow-up for urolithiasis. Patient went to the emergency room in August 2023 with abdominal pain and was found to have a 4 mm left UVJ calculus. Patient is not experiencing any further pain. She does have persistent microscopic hematuria. She has a 1 cm right renal calculus not well seen on CT scout film. We attempted to do cystoscopy last visit however due to very poor mobility and flexibility with legs were unable to do this.   07/11/2024: 73 year old woman with history of microscopic hematuria as well as urolithiasis comes back for frequent UTIs and gross hematuria. She has a known 1 cm right renal pelvis stone as well as 4 mm UVJ stone. We attempted diagnostic cystoscopy in the office however due to positioning she was unable to have this done. She has been having more frequent UTIs and symptoms include dysuria and odor. They are occurring about every 2 months. She also uses a pure wick at night and has noticed blood in it before.     ALLERGIES:  Plastic Type    MEDICATIONS: Metoprolol  Tartrate 50 MG Tablet  VESIcare 5 MG Tablet  Alendronate Sodium 70 MG Tablet  Calcium 600 + Vit D  CVS CoQ-10 400 MG Capsule  dilTIAZem  HCl  DULoxetine  HCl 30 MG Capsule Delayed Release Particles  Gabapentin   Glucosamine  HumaLOG 100 UNIT/ML Solution Cartridge  Lantus  100 UNIT/ML Solution  Latanoprost  0.005 % Solution  Losartan  Potassium 100 MG Tablet  Multivitamin  Nortriptyline  HCl 10 MG Capsule  One Touch  Pravastatin  Sodium 40 MG Tablet  Relion  Vitamin C 1000 MG Tablet     GU PSH: Locm 300-399Mg /Ml Iodine , - 2023       PSH Notes: Right arm surgery 1995.    NON-GU PSH: Cesarean Delivery Hysterectomy Leg surgery (unspecified) Shoulder Surgery (Unspecified)     GU PMH: Renal calculus - 09/18/2022 Urinary Urgency - 09/18/2022, - 2023, - 2023 Microscopic hematuria - 2023, - 2023, - 2023 Urge incontinence - 2023, - 2023      PMH Notes: Diabetes.   NON-GU PMH: Anxiety Arthritis Depression Glaucoma Hypercholesterolemia Hypertension    FAMILY HISTORY: No Family History    SOCIAL HISTORY: Marital Status: Married Preferred Language: English; Ethnicity: Not Hispanic Or Latino; Race: White Drinks 1 caffeinated drink per day.    REVIEW OF SYSTEMS:    GU Review Female:   Patient denies frequent urination, hard to postpone urination, burning /pain with urination, get up at night to urinate, leakage of urine, stream starts and stops, trouble starting your stream, have to strain to urinate,  and being pregnant.  Gastrointestinal (Upper):   Patient denies nausea, vomiting, and indigestion/ heartburn.  Gastrointestinal (Lower):   Patient denies diarrhea and constipation.  Constitutional:   Patient denies fever, night sweats, weight loss, and fatigue.  Skin:   Patient denies skin rash/ lesion and itching.  Eyes:   Patient denies blurred vision and double vision.  Ears/ Nose/ Throat:   Patient denies sore throat and sinus  problems.  Hematologic/Lymphatic:   Patient denies swollen glands and easy bruising.  Cardiovascular:   Patient denies leg swelling and chest pains.  Respiratory:   Patient denies cough and shortness of breath.  Endocrine:   Patient denies excessive thirst.  Musculoskeletal:   Patient denies back pain and joint pain.  Neurological:   Patient denies headaches and dizziness.  Psychologic:   Patient denies depression and anxiety.   VITAL SIGNS:      07/11/2024 11:05 AM  BP 146/80 mmHg  Pulse 106 /min  Temperature 97.7 F / 36.5 C   MULTI-SYSTEM PHYSICAL EXAMINATION:    Constitutional: Well-nourished. No physical deformities. Normally developed. Good grooming.  Neck: Neck symmetrical, not swollen. Normal tracheal position.  Respiratory: No labored breathing, no use of accessory muscles.   Skin: No paleness, no jaundice, no cyanosis. No lesion, no ulcer, no rash.  Neurologic / Psychiatric: Oriented to time, oriented to place, oriented to person. No depression, no anxiety, no agitation.  Eyes: Normal conjunctivae. Normal eyelids.  Ears, Nose, Mouth, and Throat: Left ear no scars, no lesions, no masses. Right ear no scars, no lesions, no masses. Nose no scars, no lesions, no masses. Normal hearing. Normal lips.  Musculoskeletal: Normal gait and station of head and neck.     Complexity of Data:  Records Review:   Previous Patient Records, POC Tool  Urine Test Review:   Urinalysis   PROCEDURES:          Visit Complexity - G2211          Urinalysis w/Scope Dipstick Dipstick Cont'd Micro  Color: Yellow Bilirubin: Neg mg/dL WBC/hpf: >39/yeq  Appearance: Cloudy Ketones: Trace mg/dL RBC/hpf: 3 - 89/yeq  Specific Gravity: 1.015 Blood: 3+ ery/uL Bacteria: Many (>50/hpf)  pH: 7.5 Protein: 1+ mg/dL Cystals: NS (Not Seen)  Glucose: Neg mg/dL Urobilinogen: 0.2 mg/dL Casts: NS (Not Seen)    Nitrites: Positive Trichomonas: Not Present    Leukocyte Esterase: 3+ leu/uL Mucous: Not Present       Epithelial Cells: 0 - 5/hpf      Yeast: NS (Not Seen)      Sperm: Not Present    ASSESSMENT:      ICD-10 Details  1 GU:   Renal calculus - N20.0 Chronic, Stable  2   Urinary Urgency - R39.15 Chronic, Stable  3   Chronic cystitis (with hematuria) - N30.21 Chronic, Worsening   PLAN:            Medications New Meds: Estradiol 0.1 MG/GM Cream 1 gram Transdermal Q HS Place 1 g of cream in the vagina nightly for 14 days then twice a week thereafter  #60  3 Refill(s)  Pharmacy Name:  [facility] [phone]  Address:  [address]   [place], [place] [address]  Phone:  [phone]  Fax:  [phone]            Orders Labs CULTURE, URINE          Document Letter(s):  Created for Patient: Clinical Summary  Notes:   1. Urgency/nocturia: Patient to continue using PureWick   2. Chronic cystitis with hematuria:  - We previously were unable to do cystoscopy but decided to let it go. Given the more frequent UTIs recommend moving forward this in the operating room. We will go ahead and treat the renal calculi at the same time as this may be a source of infection as well as hematuria.   3. Urolithiasis: Will start with right sided ureteroscopy however she did have a 4 mm left UVJ stone as well. Will plan for left-sided pyelogram and possible ureteroscopy.   Risk of benefits discussed with the patient. She will be scheduled for next available surgery date.

## 2024-08-01 NOTE — Anesthesia Procedure Notes (Signed)
 Procedure Name: Intubation Date/Time: 08/01/2024 11:20 AM  Performed by: Vera Rochele PARAS, CRNAPre-anesthesia Checklist: Patient identified, Emergency Drugs available, Suction available and Patient being monitored Patient Re-evaluated:Patient Re-evaluated prior to induction Oxygen  Delivery Method: Circle System Utilized Preoxygenation: Pre-oxygenation with 100% oxygen  Induction Type: IV induction Ventilation: Mask ventilation without difficulty LMA: LMA inserted LMA Size: 4.0 Tube type: Oral Number of attempts: 1 Airway Equipment and Method: Stylet and Oral airway Placement Confirmation: ETT inserted through vocal cords under direct vision, positive ETCO2 and breath sounds checked- equal and bilateral Tube secured with: Tape Dental Injury: Teeth and Oropharynx as per pre-operative assessment

## 2024-08-02 ENCOUNTER — Encounter (HOSPITAL_COMMUNITY): Payer: Self-pay | Admitting: Urology

## 2024-08-08 DIAGNOSIS — R8271 Bacteriuria: Secondary | ICD-10-CM | POA: Diagnosis not present

## 2024-08-08 DIAGNOSIS — N3021 Other chronic cystitis with hematuria: Secondary | ICD-10-CM | POA: Diagnosis not present

## 2024-08-08 DIAGNOSIS — N200 Calculus of kidney: Secondary | ICD-10-CM | POA: Diagnosis not present

## 2024-08-11 ENCOUNTER — Other Ambulatory Visit: Payer: Self-pay | Admitting: Urology

## 2024-08-16 NOTE — H&P (Signed)
 CC/HPI: cc: microhematuria, UTIs   02/06/22: 73 year old woman referred for microscopic hematuria. UA today shows 3-10 RBCs per high-powered field. Patient denies any gross hematuria. She used to get frequent UTIs however has not had many recently. She also has urinary urgency and urge incontinence. As soon as she has the urge to go to the bathroom she has complete loss of urine. She tried medication many years ago but it was not affordable. She is unsure what this medication was.   04/17/2022: 73 year old woman here for cystoscopy for microscopic hematuria evaluation. CT scan showed a 1 cm right renal pelvis stone. In addition patient had symptoms of urinary urgency and frequency and was given samples of Myrbetriq  at her last visit. In addition patient was given samples of Myrbetriq  for urgency and frequency at her last visit. She does think this is helping.   09/18/2022: 73 year old woman who underwent an evaluation for microscopic hematuria here for follow-up for urolithiasis. Patient went to the emergency room in August 2023 with abdominal pain and was found to have a 4 mm left UVJ calculus. Patient is not experiencing any further pain. She does have persistent microscopic hematuria. She has a 1 cm right renal calculus not well seen on CT scout film. We attempted to do cystoscopy last visit however due to very poor mobility and flexibility with legs were unable to do this.   07/11/2024: 73 year old woman with history of microscopic hematuria as well as urolithiasis comes back for frequent UTIs and gross hematuria. She has a known 1 cm right renal pelvis stone as well as 4 mm UVJ stone. We attempted diagnostic cystoscopy in the office however due to positioning she was unable to have this done. She has been having more frequent UTIs and symptoms include dysuria and odor. They are occurring about every 2 months. She also uses a pure wick at night and has noticed blood in it before.   08/08/2024:  [name] is  here today in between staged ureteroscopy procedures to treat bilateral renal and ureteral calculi. The patient has been taking one 500 mg capsule of cephalexin  nightly for prophylaxis. Prior to surgery the patient was treated for a Klebsiella pneumonia urine culture with cefpodoxime.   The patient denies any fevers chills nausea or vomiting since her procedure. She does have some right-sided groin pain that is intermittent and mild. She is not having to take anything for pain. She reports her urine has been dark, and she is occasionally seeing a little bit of blood here and there. She does endorse dysuria. Denies CP or SOB today.     ALLERGIES: Codeine Plastic Type    MEDICATIONS: Metoprolol  Tartrate 50 MG Tablet  VESIcare 5 MG Tablet  Alendronate Sodium 70 MG Tablet  Calcium 600 + Vit D  CVS CoQ-10 400 MG Capsule  dilTIAZem  HCl  DULoxetine  HCl 30 MG Capsule Delayed Release Particles  Estradiol 0.1 MG/GM Cream 1 gram Transdermal Q HS Place 1 g of cream in the vagina nightly for 14 days then twice a week thereafter  Gabapentin   Glucosamine  HumaLOG 100 UNIT/ML Solution Cartridge  Lantus  100 UNIT/ML Solution  Latanoprost  0.005 % Solution  Losartan  Potassium 100 MG Tablet  Multivitamin  Nortriptyline  HCl 10 MG Capsule  One Touch  Pravastatin  Sodium 40 MG Tablet  Relion  Vitamin C 1000 MG Tablet     GU PSH: Locm 300-399Mg /Ml Iodine , - 2023       PSH Notes: Right arm surgery 1995.    NON-GU  PSH: Cesarean Delivery Hysterectomy Leg surgery (unspecified) Shoulder Surgery (Unspecified) Visit Complexity (formerly GPC1X) - 07/11/2024     GU PMH: Chronic cystitis (with hematuria) - 07/11/2024 Renal calculus - 07/11/2024, - 09/18/2022 Urinary Urgency - 07/11/2024, - 09/18/2022, - 2023, - 2023 Microscopic hematuria - 2023, - 2023, - 2023 Urge incontinence - 2023, - 2023      PMH Notes: Diabetes.   NON-GU PMH:  Anxiety Arthritis Depression Glaucoma Hypercholesterolemia Hypertension    FAMILY HISTORY: No Family History    SOCIAL HISTORY: Marital Status: Married Preferred Language: English; Ethnicity: Not Hispanic Or Latino; Race: White Drinks 1 caffeinated drink per day.    REVIEW OF SYSTEMS:    GU Review Female:   Patient reports burning /pain with urination. Patient denies frequent urination, hard to postpone urination, get up at night to urinate, leakage of urine, stream starts and stops, trouble starting your stream, have to strain to urinate, and being pregnant.  Gastrointestinal (Upper):   Patient denies nausea and vomiting.  Gastrointestinal (Lower):   Patient denies diarrhea and constipation.  Constitutional:   Patient denies fever and fatigue.  Musculoskeletal:   Patient denies back pain.   VITAL SIGNS:      08/08/2024 12:56 PM  BP 146/77 mmHg  Pulse 114 /min   MULTI-SYSTEM PHYSICAL EXAMINATION:    Constitutional: Well-nourished. No physical deformities. Normally developed. Good grooming.   Respiratory: No labored breathing, no use of accessory muscles.   Neurologic / Psychiatric: Oriented to time, oriented to place, oriented to person. No depression, no anxiety, no agitation.      Complexity of Data:  Source Of History:  Patient, Medical Record Summary  Records Review:   Previous Doctor Records, Previous Hospital Records, Previous Patient Records  Urine Test Review:   Urinalysis, Urine Culture   PROCEDURES:         PVR Ultrasound - 48201  Scanned Volume: 0 cc         Visit Complexity - G2211          Urinalysis w/Scope - 81001 Dipstick Dipstick Cont'd Micro  Color: Brown Bilirubin: Neg WBC/hpf: 6 - 10/hpf  Appearance: Cloudy Ketones: Trace RBC/hpf: 40 - 60/hpf  Specific Gravity: 1.025 Blood: 3+ Bacteria: Few (10-25/hpf)  pH: 6.0 Protein: 3+ Cystals: NS (Not Seen)  Glucose: 1+ Urobilinogen: 0.2 Casts: NS (Not Seen)    Nitrites: Neg Trichomonas: Not Present     Leukocyte Esterase: 1+ Mucous: Not Present      Epithelial Cells: 0 - 5/hpf      Yeast: NS (Not Seen)      Sperm: Not Present    Notes:      ASSESSMENT:      ICD-10 Details  1 GU:   Renal calculus - N20.0 Chronic, Exacerbation  2   Chronic cystitis (with hematuria) - N30.21 Chronic, Exacerbation   PLAN:           Schedule Return Visit/Planned Activity: Next Available Appointment - Schedule Surgery          Document Letter(s):  Created for Patient: Clinical Summary         Notes:   I will send the patient's urine for culture. She does not have any questions for me today. Will follow-up on the culture appropriately and the patient will proceed for the second part of her staged procedures.

## 2024-08-17 ENCOUNTER — Encounter (HOSPITAL_COMMUNITY): Payer: Self-pay | Admitting: Urology

## 2024-08-17 ENCOUNTER — Other Ambulatory Visit: Payer: Self-pay

## 2024-08-17 NOTE — Progress Notes (Signed)
 Same day patient done over the phone.  COVID Vaccine Completed:  Date of COVID positive in last 90 days:  PCP - Toribio Shan, MD Cardiologist - has not seen once, recently diagnosed with CHF and a fib  Chest x-ray - N/A EKG - 08/01/24 Epic Stress Test - long time ago per pt ECHO - 11/27/22 Epic Cardiac Cath - n/a Pacemaker/ICD device last checked:N/A Spinal Cord Stimulator:N/A  Bowel Prep - N/A  Sleep Study - N/A CPAP -   Fasting Blood Sugar - 87-221 Checks Blood Sugar  has CGM- dexcom G7  Humalog TID- do not take morning of surgery unless blood sugar is greater than 220, then take 50% Lantus  BID - take 50% night before and morning of surgery.  Last dose of GLP1 agonist-  N/A GLP1 instructions:  Do not take after     Last dose of SGLT-2 inhibitors-  N/A SGLT-2 instructions:  Do not take after     Blood Thinner Instructions: no longer on Eliquis  per pt Aspirin  Instructions:N/A Last Dose:  Activity level: Denies symptoms of chest pain or shortness of breath. Uses lift chair. Can stand with assistance to transfer to chair. Patient is electric w/c. Will be getting transported by [facility] moving forward. Needs help with ADLS  Anesthesia review: a fib, HTN, CHF, DM1,   Patient denies shortness of breath, fever, cough and chest pain at PAT appointment  Patient verbalized understanding of instructions that were given to them at the PAT appointment. Patient was also instructed that they will need to review over the PAT instructions again at home before surgery.

## 2024-08-24 ENCOUNTER — Other Ambulatory Visit: Payer: Self-pay

## 2024-08-24 ENCOUNTER — Ambulatory Visit (HOSPITAL_COMMUNITY): Payer: Self-pay | Admitting: Physician Assistant

## 2024-08-24 ENCOUNTER — Ambulatory Visit (HOSPITAL_COMMUNITY)
Admission: RE | Admit: 2024-08-24 | Discharge: 2024-08-24 | Disposition: A | Discharge status: Home / Self Care | Attending: Urology | Admitting: Urology

## 2024-08-24 ENCOUNTER — Ambulatory Visit (HOSPITAL_COMMUNITY)

## 2024-08-24 ENCOUNTER — Encounter (HOSPITAL_COMMUNITY)
Admission: RE | Disposition: A | Discharge status: Home / Self Care | Payer: Self-pay | Source: Home / Self Care | Attending: Urology

## 2024-08-24 ENCOUNTER — Encounter (HOSPITAL_COMMUNITY): Payer: Self-pay | Admitting: Urology

## 2024-08-24 DIAGNOSIS — I509 Heart failure, unspecified: Secondary | ICD-10-CM | POA: Diagnosis not present

## 2024-08-24 DIAGNOSIS — I4891 Unspecified atrial fibrillation: Secondary | ICD-10-CM | POA: Diagnosis not present

## 2024-08-24 DIAGNOSIS — N200 Calculus of kidney: Secondary | ICD-10-CM

## 2024-08-24 DIAGNOSIS — I110 Hypertensive heart disease with heart failure: Secondary | ICD-10-CM | POA: Diagnosis not present

## 2024-08-24 DIAGNOSIS — Z466 Encounter for fitting and adjustment of urinary device: Secondary | ICD-10-CM | POA: Diagnosis not present

## 2024-08-24 DIAGNOSIS — N3021 Other chronic cystitis with hematuria: Secondary | ICD-10-CM | POA: Diagnosis not present

## 2024-08-24 DIAGNOSIS — Z79899 Other long term (current) drug therapy: Secondary | ICD-10-CM | POA: Insufficient documentation

## 2024-08-24 DIAGNOSIS — N202 Calculus of kidney with calculus of ureter: Secondary | ICD-10-CM | POA: Insufficient documentation

## 2024-08-24 DIAGNOSIS — I5032 Chronic diastolic (congestive) heart failure: Secondary | ICD-10-CM

## 2024-08-24 LAB — GLUCOSE, CAPILLARY
Glucose-Capillary: 212 mg/dL — ABNORMAL HIGH (ref 70–99)
Glucose-Capillary: 223 mg/dL — ABNORMAL HIGH (ref 70–99)

## 2024-08-24 SURGERY — CYSTOSCOPY/URETEROSCOPY/HOLMIUM LASER/STENT PLACEMENT
Anesthesia: General | Laterality: Right

## 2024-08-24 MED ORDER — ONDANSETRON HCL 4 MG/2ML IJ SOLN
INTRAMUSCULAR | Status: DC | PRN
  Administered 2024-08-24: 4 mg via INTRAVENOUS

## 2024-08-24 MED ORDER — CEFAZOLIN SODIUM-DEXTROSE 2-4 GM/100ML-% IV SOLN
2.0000 g | INTRAVENOUS | Status: AC
Start: 2024-08-24 — End: 2024-08-24
  Administered 2024-08-24: 2 g via INTRAVENOUS
  Filled 2024-08-24: qty 100

## 2024-08-24 MED ORDER — ONDANSETRON HCL 4 MG/2ML IJ SOLN
INTRAMUSCULAR | Status: AC
  Filled 2024-08-24: qty 2

## 2024-08-24 MED ORDER — SODIUM CHLORIDE 0.9 % IR SOLN
Status: DC | PRN
  Administered 2024-08-24: 3000 mL

## 2024-08-24 MED ORDER — DROPERIDOL 2.5 MG/ML IJ SOLN: 0.6250 mg | Freq: Once | INTRAMUSCULAR | Status: DC | PRN

## 2024-08-24 MED ORDER — FENTANYL CITRATE (PF) 50 MCG/ML IJ SOSY: 25.0000 ug | PREFILLED_SYRINGE | INTRAMUSCULAR | Status: DC | PRN

## 2024-08-24 MED ORDER — FENTANYL CITRATE (PF) 100 MCG/2ML IJ SOLN
INTRAMUSCULAR | Status: DC | PRN
  Administered 2024-08-24: 25 ug via INTRAVENOUS
  Administered 2024-08-24: 50 ug via INTRAVENOUS
  Administered 2024-08-24: 25 ug via INTRAVENOUS

## 2024-08-24 MED ORDER — CHLORHEXIDINE GLUCONATE 0.12 % MT SOLN
15.0000 mL | Freq: Once | OROMUCOSAL | Status: AC
  Administered 2024-08-24: 15 mL via OROMUCOSAL

## 2024-08-24 MED ORDER — PROPOFOL 10 MG/ML IV BOLUS
INTRAVENOUS | Status: DC | PRN
  Administered 2024-08-24: 30 mg via INTRAVENOUS
  Administered 2024-08-24: 120 mg via INTRAVENOUS

## 2024-08-24 MED ORDER — DEXAMETHASONE SODIUM PHOSPHATE 4 MG/ML IJ SOLN
INTRAMUSCULAR | Status: DC | PRN
  Administered 2024-08-24: 5 mg via INTRAVENOUS

## 2024-08-24 MED ORDER — ACETAMINOPHEN 10 MG/ML IV SOLN: 1000.0000 mg | Freq: Once | INTRAVENOUS | Status: DC | PRN

## 2024-08-24 MED ORDER — 0.9 % SODIUM CHLORIDE (POUR BTL) OPTIME
TOPICAL | Status: DC | PRN
Start: 2024-08-24 — End: 2024-08-24
  Administered 2024-08-24: 1000 mL

## 2024-08-24 MED ORDER — LIDOCAINE HCL (CARDIAC) PF 100 MG/5ML IV SOSY
PREFILLED_SYRINGE | INTRAVENOUS | Status: DC | PRN
  Administered 2024-08-24: 80 mg via INTRAVENOUS

## 2024-08-24 MED ORDER — LACTATED RINGERS IV SOLN: INTRAVENOUS | Status: DC

## 2024-08-24 MED ORDER — FENTANYL CITRATE (PF) 100 MCG/2ML IJ SOLN
INTRAMUSCULAR | Status: AC
  Filled 2024-08-24: qty 2

## 2024-08-24 MED ORDER — INSULIN ASPART 100 UNIT/ML IJ SOLN
INTRAMUSCULAR | Status: AC
  Filled 2024-08-24: qty 1

## 2024-08-24 MED ORDER — PROPOFOL 10 MG/ML IV BOLUS
INTRAVENOUS | Status: AC
  Filled 2024-08-24: qty 20

## 2024-08-24 MED ORDER — OXYCODONE HCL 5 MG/5ML PO SOLN: 5.0000 mg | Freq: Once | ORAL | Status: DC | PRN

## 2024-08-24 MED ORDER — DEXAMETHASONE SODIUM PHOSPHATE 10 MG/ML IJ SOLN
INTRAMUSCULAR | Status: AC
  Filled 2024-08-24: qty 1

## 2024-08-24 MED ORDER — INSULIN ASPART 100 UNIT/ML IJ SOLN
6.0000 [IU] | Freq: Once | INTRAMUSCULAR | Status: AC
  Administered 2024-08-24: 6 [IU] via SUBCUTANEOUS

## 2024-08-24 MED ORDER — OXYCODONE HCL 5 MG PO TABS: 5.0000 mg | ORAL_TABLET | Freq: Once | ORAL | Status: DC | PRN

## 2024-08-24 MED ORDER — LIDOCAINE HCL (PF) 2 % IJ SOLN
INTRAMUSCULAR | Status: AC
  Filled 2024-08-24: qty 5

## 2024-08-24 SURGICAL SUPPLY — 19 items
BAG URO CATCHER STRL LF (MISCELLANEOUS) ×2 IMPLANT
BASKET ZERO TIP NITINOL 2.4FR (BASKET) IMPLANT
CATH URETL OPEN 5X70 (CATHETERS) ×2 IMPLANT
CLOTH BEACON ORANGE TIMEOUT ST (SAFETY) ×2 IMPLANT
DRSG TEGADERM 2-3/8X2-3/4 SM (GAUZE/BANDAGES/DRESSINGS) IMPLANT
FIBER LASER MOSES 200 DFL (Laser) IMPLANT
GLOVE BIO SURGEON STRL SZ 6.5 (GLOVE) ×2 IMPLANT
GOWN STRL REUS W/ TWL LRG LVL3 (GOWN DISPOSABLE) ×2 IMPLANT
GUIDEWIRE STR DUAL SENSOR (WIRE) ×2 IMPLANT
GUIDEWIRE STRT TIP .038X150X3 (WIRE) IMPLANT
KIT TURNOVER KIT A (KITS) ×2 IMPLANT
MANIFOLD NEPTUNE II (INSTRUMENTS) ×2 IMPLANT
PACK CYSTO (CUSTOM PROCEDURE TRAY) ×2 IMPLANT
SHEATH NAVIGATOR HD 11/13X28 (SHEATH) IMPLANT
SHEATH NAVIGATOR HD 11/13X36 (SHEATH) IMPLANT
STENT URET 6FRX24 CONTOUR (STENTS) IMPLANT
TRACTIP FLEXIVA PULS ID 200XHI (Laser) IMPLANT
TUBING CONNECTING 10 (TUBING) ×2 IMPLANT
TUBING UROLOGY SET (TUBING) ×2 IMPLANT
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
## 2024-08-24 NOTE — Interval H&P Note (Signed)
 History and Physical Interval Note:  08/24/2024 9:43 AM  [name]  has presented today for surgery, with the diagnosis of RIGHT RENAL CALCULI.  The various methods of treatment have been discussed with the patient and family. After consideration of risks, benefits and other options for treatment, the patient has consented to  Procedure(s) with comments: CYSTOSCOPY/URETEROSCOPY/HOLMIUM LASER/STENT PLACEMENT (N/A) - CYSTOSCOPY/RIGHT URETEROSCOPY/HOLMIUM LASER/STENT EXCHANGE as a surgical intervention.  The patient's history has been reviewed, patient examined, no change in status, stable for surgery.  I have reviewed the patient's chart and labs.  Questions were answered to the patient's satisfaction.     Coryn Mosso D Fin Hupp

## 2024-08-24 NOTE — Anesthesia Procedure Notes (Signed)
 Procedure Name: LMA Insertion Date/Time: 08/24/2024 10:19 AM  Performed by: Pam Macario BROCKS, CRNAPre-anesthesia Checklist: Patient identified, Emergency Drugs available, Suction available, Patient being monitored and Timeout performed Patient Re-evaluated:Patient Re-evaluated prior to induction Oxygen  Delivery Method: Circle system utilized Preoxygenation: Pre-oxygenation with 100% oxygen  Induction Type: IV induction Ventilation: Mask ventilation without difficulty LMA: LMA inserted LMA Size: 4.0 Number of attempts: 1 Airway Equipment and Method: Bite block Placement Confirmation: positive ETCO2, breath sounds checked- equal and bilateral and CO2 detector Tube secured with: Tape Dental Injury: Teeth and Oropharynx as per pre-operative assessment

## 2024-08-24 NOTE — Op Note (Signed)
 Preoperative diagnosis: right renal calculus  Postoperative diagnosis: right renal calculus  Procedure:  Cystoscopy right ureteroscopy, laser lithotripsy, basket stone extraction right 40F x 24cm ureteral stent exchange - with string   Surgeon: Valli Shank, MD  Anesthesia: General  Complications: None  Intraoperative findings:  Bilateral orthotropic ureteral orifices Normal urethra Bladder mucosa normal without masses   EBL: Minimal  Specimens: right renal calculus  Disposition of specimens: [facility] for stone analysis  Indication: [name] is a 73 y.o.   patient with a 12mm right renal calculus stone and gross hematuria. After reviewing the management options for treatment, the patient elected to proceed with the above surgical procedure(s). We have discussed the potential benefits and risks of the procedure, side effects of the proposed treatment, the likelihood of the patient achieving the goals of the procedure, and any potential problems that might occur during the procedure or recuperation. Informed consent has been obtained.   Description of procedure:  The patient was taken to the operating room and general anesthesia was induced.  The patient was placed in the dorsal lithotomy position, prepped and draped in the usual sterile fashion, and preoperative antibiotics were administered. A preoperative time-out was performed.   Cystourethroscopy was performed.  The patient's urethra was examined and was normal.  The bladder was then systematically examined in its entirety. There was no evidence for any bladder tumors, stones, or other mucosal pathology.    Attention then turned to the right ureteral orifice and graspers were used to bring the existing ureteral stent to the urethral meatus.  A 0.38 sensor wire was advanced through the ureteral stent and up to the kidney with fluoroscopic guidance.  The stent was removed.  A second sensor wire was  placed alongside the first sensor wire and up to the kidney with fluoroscopic guidance.  One wire was secured as a safety wire.  Next ureteral access sheath was placed over the working wire and into the proximal ureter.  The inner sheath and wire were removed.  Flexible ureteroscopy took place.  The known calculus was then seen in the renal pelvis and fragmented with 242 micron holmium laser fiber.  A few stone fragments were then removed from the ureter with a ZeroTip basket.  Visualization was poor and the ston was adequately dusted so the residual stone fragments were left. Reinspection of the ureter revealed no remaining visible stones or fragments.  The ureteral access sheath was removed in unison with the ureteroscope taking care to examine the ureter on the way out.  There was no trauma noted to the ureter.  The wire was then backloaded through the cystoscope and a ureteral stent was advance over the wire using Seldinger technique.  The stent was positioned appropriately under fluoroscopic and cystoscopic guidance.  The wire was then removed with an adequate stent curl noted in the renal pelvis as well as in the bladder.  The bladder was then emptied and the procedure ended.  The patient appeared to tolerate the procedure well and without complications.  The patient was able to be awakened and transferred to the recovery unit in satisfactory condition.   Disposition: The tether of the stent was left on and tucked inside the patient's vagina.  Instructions for removing the stent have been provided to the patient.

## 2024-08-24 NOTE — Transfer of Care (Signed)
 Immediate Anesthesia Transfer of Care Note  Patient: [name]  Procedure(s) Performed: CYSTOSCOPY/URETEROSCOPY/HOLMIUM LASER/STENT PLACEMENT (Right)  Patient Location: PACU  Anesthesia Type:General  Level of Consciousness: awake  Airway & Oxygen  Therapy: Patient Spontanous Breathing and Patient connected to face mask oxygen   Post-op Assessment: Report given to RN  Post vital signs: Reviewed and stable  Last Vitals:  Vitals Value Taken Time  BP 158/84 08/24/24 11:19  Temp    Pulse 78 08/24/24 11:21  Resp 16 08/24/24 11:21  SpO2 100 % 08/24/24 11:21  Vitals shown include unfiled device data.  Last Pain:  Vitals:   08/24/24 0907  TempSrc:   PainSc: 0-No pain         Complications: No notable events documented.

## 2024-08-24 NOTE — Anesthesia Postprocedure Evaluation (Signed)
 Anesthesia Post Note  Patient: [name]  Procedure(s) Performed: CYSTOSCOPY/URETEROSCOPY/HOLMIUM LASER/STENT PLACEMENT (Right)     Patient location during evaluation: PACU Anesthesia Type: General Level of consciousness: awake and alert Pain management: pain level controlled Vital Signs Assessment: post-procedure vital signs reviewed and stable Respiratory status: spontaneous breathing, nonlabored ventilation, respiratory function stable and patient connected to nasal cannula oxygen  Cardiovascular status: blood pressure returned to baseline and stable Postop Assessment: no apparent nausea or vomiting Anesthetic complications: no   No notable events documented.  Last Vitals:  Vitals:   08/24/24 1123 08/24/24 1130  BP:  (!) 153/78  Pulse: 80 70  Resp: 15 12  Temp:    SpO2: 100% 92%    Last Pain:  Vitals:   08/24/24 1130  TempSrc:   PainSc: 0-No pain                 [name]

## 2024-08-24 NOTE — Discharge Instructions (Signed)
 DISCHARGE INSTRUCTIONS FOR KIDNEY STONE/URETERAL STENT   MEDICATIONS:  1. Resume all your other meds from home  2. AZO over the counter can help with the burning/stinging when you urinate. 3.  Tylenol  can be used for stent discomfort   ACTIVITY:  1. No strenuous activity x 1week  2. No driving while on narcotic pain medications  3. Drink plenty of water  4. Continue to walk at home - you can still get blood clots when you are at home, so keep active, but don't over do it.  5. May return to work/school tomorrow or when you feel ready   BATHING:  1. You can shower and we recommend daily showers  2. You have a string coming from your urethra: The stent string is attached to your ureteral stent. Do not pull on this.   SIGNS/SYMPTOMS TO CALL:  Please call us  if you have a fever greater than 101.5, uncontrolled nausea/vomiting, uncontrolled pain, dizziness, unable to urinate, bloody urine, chest pain, shortness of breath, leg swelling, leg pain, redness around wound, drainage from wound, or any other concerns or questions.   You can reach us  at [phone].   FOLLOW-UP:  1. You have a string attached to your stent, you may remove it on Thursday, October 16th. To do this, pull the string until the stent is completely removed. You may feel an odd sensation in your back.

## 2024-08-25 ENCOUNTER — Encounter (HOSPITAL_COMMUNITY): Payer: Self-pay | Admitting: Urology

## 2024-08-31 DIAGNOSIS — N3021 Other chronic cystitis with hematuria: Secondary | ICD-10-CM | POA: Diagnosis not present

## 2024-08-31 DIAGNOSIS — N200 Calculus of kidney: Secondary | ICD-10-CM | POA: Diagnosis not present

## 2024-09-14 DIAGNOSIS — E1051 Type 1 diabetes mellitus with diabetic peripheral angiopathy without gangrene: Secondary | ICD-10-CM | POA: Diagnosis not present

## 2024-09-18 ENCOUNTER — Encounter: Payer: Self-pay | Admitting: Radiology

## 2024-10-03 ENCOUNTER — Ambulatory Visit: Admitting: Podiatry

## 2024-10-04 ENCOUNTER — Ambulatory Visit: Admitting: Podiatry

## 2024-11-02 ENCOUNTER — Ambulatory Visit: Admitting: Podiatry

## 2024-12-05 ENCOUNTER — Ambulatory Visit: Admitting: Podiatry
